# Patient Record
Sex: Female | Born: 1980 | Race: White | Hispanic: No | Marital: Single | Smoking: Never smoker
Health system: Southern US, Community
[De-identification: ages and names within clinical notes are randomized; demographics above are authoritative.]

## PROBLEM LIST (undated history)

## (undated) DIAGNOSIS — L03116 Cellulitis of left lower limb: Secondary | ICD-10-CM

## (undated) DIAGNOSIS — Z889 Allergy status to unspecified drugs, medicaments and biological substances status: Secondary | ICD-10-CM

## (undated) DIAGNOSIS — G2581 Restless legs syndrome: Secondary | ICD-10-CM

## (undated) DIAGNOSIS — Z973 Presence of spectacles and contact lenses: Secondary | ICD-10-CM

## (undated) DIAGNOSIS — K589 Irritable bowel syndrome without diarrhea: Secondary | ICD-10-CM

## (undated) DIAGNOSIS — I10 Essential (primary) hypertension: Secondary | ICD-10-CM

## (undated) DIAGNOSIS — H35039 Hypertensive retinopathy, unspecified eye: Secondary | ICD-10-CM

## (undated) DIAGNOSIS — J45909 Unspecified asthma, uncomplicated: Secondary | ICD-10-CM

## (undated) DIAGNOSIS — E11319 Type 2 diabetes mellitus with unspecified diabetic retinopathy without macular edema: Secondary | ICD-10-CM

## (undated) DIAGNOSIS — E119 Type 2 diabetes mellitus without complications: Secondary | ICD-10-CM

## (undated) DIAGNOSIS — K219 Gastro-esophageal reflux disease without esophagitis: Secondary | ICD-10-CM

## (undated) DIAGNOSIS — E113599 Type 2 diabetes mellitus with proliferative diabetic retinopathy without macular edema, unspecified eye: Secondary | ICD-10-CM

## (undated) HISTORY — DX: Type 2 diabetes mellitus with unspecified diabetic retinopathy without macular edema: E11.319

## (undated) HISTORY — PX: TONSILLECTOMY: SUR1361

## (undated) HISTORY — DX: Hypertensive retinopathy, unspecified eye: H35.039

## (undated) HISTORY — PX: CHOLECYSTECTOMY: SHX55

## (undated) HISTORY — PX: TOOTH EXTRACTION: SUR596

## (undated) HISTORY — PX: CYST EXCISION: SHX5701

---

## 2002-03-31 ENCOUNTER — Emergency Department (HOSPITAL_COMMUNITY): Admission: EM | Admit: 2002-03-31 | Discharge: 2002-03-31 | Payer: Self-pay | Admitting: Emergency Medicine

## 2007-11-04 ENCOUNTER — Emergency Department (HOSPITAL_COMMUNITY): Admission: EM | Admit: 2007-11-04 | Discharge: 2007-11-04 | Payer: Self-pay | Admitting: Emergency Medicine

## 2007-11-23 ENCOUNTER — Emergency Department (HOSPITAL_COMMUNITY): Admission: EM | Admit: 2007-11-23 | Discharge: 2007-11-23 | Payer: Self-pay | Admitting: Emergency Medicine

## 2007-11-28 ENCOUNTER — Ambulatory Visit: Payer: Self-pay | Admitting: Internal Medicine

## 2007-11-28 ENCOUNTER — Observation Stay (HOSPITAL_COMMUNITY): Admission: EM | Admit: 2007-11-28 | Discharge: 2007-11-29 | Payer: Self-pay | Admitting: Emergency Medicine

## 2009-06-26 ENCOUNTER — Emergency Department (HOSPITAL_COMMUNITY): Admission: EM | Admit: 2009-06-26 | Discharge: 2009-06-26 | Payer: Self-pay | Admitting: Emergency Medicine

## 2011-03-03 LAB — BASIC METABOLIC PANEL
BUN: 11 mg/dL (ref 6–23)
Calcium: 9.3 mg/dL (ref 8.4–10.5)
Creatinine, Ser: 0.6 mg/dL (ref 0.4–1.2)
GFR calc non Af Amer: >60 mL/min (ref >60)
Glucose, Bld: 284 mg/dL — ABNORMAL HIGH (ref 70–99)

## 2011-03-03 LAB — URINALYSIS, ROUTINE W REFLEX MICROSCOPIC
Protein, ur: NEGATIVE mg/dL
Urobilinogen, UA: 1 mg/dL (ref 0.0–1.0)

## 2011-03-03 LAB — DIFFERENTIAL
Basophils Absolute: 0 10*3/uL (ref 0.0–0.1)
Eosinophils Relative: 2 % (ref 0–5)
Lymphocytes Relative: 36 % (ref 12–46)
Neutro Abs: 4.3 10*3/uL (ref 1.7–7.7)
Neutrophils Relative %: 55 % (ref 43–77)

## 2011-03-03 LAB — URINE MICROSCOPIC-ADD ON

## 2011-03-03 LAB — WET PREP, GENITAL
Trich, Wet Prep: NONE SEEN
Yeast Wet Prep HPF POC: NONE SEEN

## 2011-03-03 LAB — PROTIME-INR: Prothrombin Time: 12.5 seconds (ref 11.6–15.2)

## 2011-03-03 LAB — CBC
HCT: 41.9 % (ref 36.0–46.0)
Platelets: 318 10*3/uL (ref 150–400)
RDW: 13.4 % (ref 11.5–15.5)

## 2011-03-03 LAB — GC/CHLAMYDIA PROBE AMP, GENITAL: GC Probe Amp, Genital: NEGATIVE

## 2011-04-10 NOTE — Discharge Summary (Signed)
Lindsay Love, Lindsay Love             ACCOUNT NO.:  [id number]   MEDICAL RECORD NO.:  [id number]          PATIENT TYPE:  OBV   LOCATION:  5118                         FACILITY:  [facility]   PHYSICIAN:  Zenaida Deed. Mayford Knife, M.D.DATE OF BIRTH:  [birth year]   DATE OF ADMISSION:  11/28/2007  DATE OF DISCHARGE:  11/29/2007                               DISCHARGE SUMMARY   DISCHARGE DIAGNOSIS:  1. Pyelonephritis.  2. Candidal vaginitis.  3. Diabetes mellitus.   DISCHARGE MEDICATIONS:  1. Keflex 500 mg twice daily.  2. Amaryl 4 mg daily.  3. Pyridium 200 mg t.i.d. x2 days.   HOSPITAL COURSE:  30 year old obese white female, presented with  hyperglycemia to 300s, associated with candidal vaginitis and outpatient  failure of urinary tract infection treatment.  Less than a week prior to  discharge, the patient seen by the emergency department here, and  diagnosed with urinary tract infection and started on Bactrim DS twice  daily.  Cultures did eventually show though that it was resistant to  this antibiotic.  In the hospital, she received ceftriaxone 1 g, and was  transitioned to Keflex for which the previous culture was sensitive.  Her diabetes remained in the 200s while she was here.  Her A1c was just  over 10.  She was not on any medications, so she was started on Amaryl 4  mg daily to try to achieve short-term glucose control in a p.o. fashion.   FOLLOWUP:  The patient can call [facility] to  arrange for followup.  She will tell them that she was seen in the  hospital and they advised her that she could be a new patient for the  practice.  If they have questions, they can call Dr. Irving Burton.   ACTIVITY:  No restrictions.   DIET:  Low-carb.   FOLLOWUP ISSUES:  Consider starting metformin, depending upon how the  patient responds to the Amaryl.  Also, consider an ACE inhibitor,  although her blood pressures were borderline here in the hospital it was  not started at  that time.  Finally, starting aspirin could be  considered, although she is very young.  Many guidelines suggest the  consideration of diabetes to be a coronary artery disease equivalent.      Angeline Slim, M.D.  Electronically Signed      Zenaida Deed. Mayford Knife, M.D.  Electronically Signed    AL/MEDQ  D:  11/29/2007  T:  11/29/2007  Job:  [id number]

## 2011-04-10 NOTE — H&P (Signed)
Lindsay Love, Lindsay Love             ACCOUNT NO.:  [id number]   MEDICAL RECORD NO.:  [id number]          PATIENT TYPE:  OBV   LOCATION:  5118                         FACILITY:  [facility]   PHYSICIAN:  Angeline Slim, M.D.  DATE OF BIRTH:  Feb 17, 1981   DATE OF ADMISSION:  11/28/2007  DATE OF DISCHARGE:                              HISTORY & PHYSICAL   CHIEF COMPLAINT:  Dysuria/vomiting/belly pain.   HISTORY OF PRESENT ILLNESS:  A 30 year old obese white female with  history of diabetes mellitus, presents with dysuria and lower abdominal  pain and nausea and vomiting that has been going on for the past few  days.  She was treated earlier this month for chlamydia and has not had  sex since that time.  She has also had a recent urinary tract infection  that grew out e-coli resistant to Cipro and Bactrim in the Emergency  Department culture back on 11/23/07.  Before the culture was grown out,  she was started on Bactrim so that was not completely treated.  Her last  menstrual period was on 11/14/08.  She is having increased vaginal  discharge and itching.  She denies any fever.   REVIEW OF SYSTEMS:  Otherwise, normal.   PAST MEDICAL HISTORY:  1. Obesity.  2. Diabetes mellitus.  3. History of STD's.   MEDICATIONS:  Essentially none.   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  She denies any alcohol, drugs  or smoking.   FAMILY HISTORY:  Noncontributory.   ALLERGIES:  No known drug allergies.   PHYSICAL EXAM:  VITAL SIGNS:  Temperature 97.1.  Sats 98% on room air.  Blood pressure 114-140/82-87, pulse 89-107, respiration 18-20.  GENERAL:  No acute distress.  Obese white female.  HEENT:  Normal external ears, nose, eyes and lips.  No thyromegaly.  Normal teeth and pharynx.  PULMONARY:  Clear to auscultation bilaterally with normal effort.  HEART:  Regular rate and rhythm with a transient soft murmur.  ABDOMEN:  Obese, soft, mild lower abdominal tenderness.  BACK:  Shows mild right  CVA tenderness.  GU:  Performed by the ED Jannifer Fischler.  Notable for thin white discharge and  no report was given of any cervical motion tenderness.  EXTREMITIES:  Show 2+ dorsalis pedis pulses.  No edema or cyanosis.  SKIN:  Shows no rash or pallor.   LABORATORY DATA:  Urine culture on 11/23/07 showed e-coli, resistant to  Cipro and Bactrim but sensitive to first and third generation  cephalosporins.  Wet prep shows few yeast, many white blood cells and  negative trich's/clue cells.  Urine pregnancy is negative.  Hemoglobin  15.8, platelets 357, white count 7.4, sodium 132.8, potassium 4.3,  glucose 276, creatinine 0.64, BUN 9, chloride 98, bicarbonate 26,  calcium 9.0.   ASSESSMENT AND PLAN:  A 30 year old white female with pyelonephritis,  yeast infection, diabetes mellitus:  1. Pyelonephritis:  Ceftriaxone will be administered in the hospital.      Consider Keflex at discharge. Last culture was sensitive to this      organism.  We will check a urine culture and await the  results,      although I would not be surprised if they are negative.  The      patient was partially treated with Bactrim despite e-coli      resistance to Bactrim.  2. Yeast:  Patient was given Diflucan 150 mg in the Emergency      Department which should treat this.  3. Diabetes mellitus:  Will start the patient on Amaryl and sliding      scale insulin.  4. Hyponatremia:  This is mild.  Will just watch it for now.  Consider      a little bit of IV fluids although the patient is not clinically      dry at this time.  5. The patient will have regular diet and be ambulatory so no DVT or      ulcer prophylaxis is indicated.      Angeline Slim, M.D.  Electronically Signed     AL/MEDQ  D:  11/29/2007  T:  11/29/2007  Job:  [id number]

## 2011-08-16 LAB — CBC
MCHC: 34.7
MCV: 85.7
Platelets: 325
Platelets: 357
RBC: 4.3
RBC: 5.23 — ABNORMAL HIGH
WBC: 7.4
WBC: 7.6

## 2011-08-16 LAB — URINALYSIS, ROUTINE W REFLEX MICROSCOPIC
Bilirubin Urine: NEGATIVE
Protein, ur: 30 — AB
Urobilinogen, UA: 0.2

## 2011-08-16 LAB — BASIC METABOLIC PANEL
CO2: 26
Calcium: 9
Calcium: 9
Chloride: 101
Creatinine, Ser: 0.64
Creatinine, Ser: 0.73
GFR calc Af Amer: >60
GFR calc Af Amer: >60
GFR calc non Af Amer: >60

## 2011-08-16 LAB — HEMOGLOBIN A1C
Hgb A1c MFr Bld: 10.9 — ABNORMAL HIGH
Mean Plasma Glucose: 311

## 2011-08-16 LAB — DIFFERENTIAL
Basophils Relative: 0
Monocytes Relative: 4
Neutro Abs: 5
Neutrophils Relative %: 68

## 2011-08-16 LAB — POCT PREGNANCY, URINE
Operator id: 28833
Preg Test, Ur: NEGATIVE

## 2011-08-16 LAB — GC/CHLAMYDIA PROBE AMP, GENITAL: GC Probe Amp, Genital: NEGATIVE

## 2011-08-16 LAB — RPR: RPR Ser Ql: NONREACTIVE

## 2011-08-16 LAB — URINE CULTURE: Colony Count: >=100000

## 2011-08-16 LAB — WET PREP, GENITAL

## 2011-08-31 LAB — URINALYSIS, ROUTINE W REFLEX MICROSCOPIC
Bilirubin Urine: NEGATIVE
Nitrite: NEGATIVE
Specific Gravity, Urine: 1.02
Urobilinogen, UA: 0.2
pH: 5.5

## 2011-08-31 LAB — URINE CULTURE

## 2011-08-31 LAB — URINE MICROSCOPIC-ADD ON

## 2011-09-03 LAB — URINE MICROSCOPIC-ADD ON

## 2011-09-03 LAB — URINALYSIS, ROUTINE W REFLEX MICROSCOPIC
Glucose, UA: >1000 — AB
Ketones, ur: 15 — AB
Nitrite: NEGATIVE
Specific Gravity, Urine: 1.042 — ABNORMAL HIGH
pH: 5.5

## 2011-09-03 LAB — WET PREP, GENITAL: Yeast Wet Prep HPF POC: NONE SEEN

## 2011-09-03 LAB — POCT PREGNANCY, URINE
Operator id: 272551
Preg Test, Ur: NEGATIVE

## 2011-09-03 LAB — RPR: RPR Ser Ql: NONREACTIVE

## 2011-09-03 LAB — URINE CULTURE: Colony Count: 3000

## 2012-12-09 ENCOUNTER — Encounter (HOSPITAL_BASED_OUTPATIENT_CLINIC_OR_DEPARTMENT_OTHER): Payer: Self-pay | Admitting: Emergency Medicine

## 2012-12-09 ENCOUNTER — Emergency Department (HOSPITAL_BASED_OUTPATIENT_CLINIC_OR_DEPARTMENT_OTHER)
Admission: EM | Admit: 2012-12-09 | Discharge: 2012-12-09 | Disposition: A | Discharge status: Home / Self Care | Payer: Medicaid Other | Attending: Emergency Medicine | Admitting: Emergency Medicine

## 2012-12-09 DIAGNOSIS — H60399 Other infective otitis externa, unspecified ear: Secondary | ICD-10-CM | POA: Insufficient documentation

## 2012-12-09 DIAGNOSIS — E119 Type 2 diabetes mellitus without complications: Secondary | ICD-10-CM | POA: Insufficient documentation

## 2012-12-09 DIAGNOSIS — Z79899 Other long term (current) drug therapy: Secondary | ICD-10-CM | POA: Insufficient documentation

## 2012-12-09 DIAGNOSIS — H6091 Unspecified otitis externa, right ear: Secondary | ICD-10-CM

## 2012-12-09 HISTORY — DX: Type 2 diabetes mellitus without complications: E11.9

## 2012-12-09 MED ORDER — ANTIPYRINE-BENZOCAINE 5.4-1.4 % OT SOLN: 3.0000 [drp] | OTIC | Status: DC | PRN

## 2012-12-09 MED ORDER — ACETAMINOPHEN-CODEINE 120-12 MG/5ML PO SUSP: 5.0000 mL | Freq: Four times a day (QID) | ORAL | Status: DC | PRN

## 2012-12-09 MED ORDER — CIPROFLOXACIN-DEXAMETHASONE 0.3-0.1 % OT SUSP
4.0000 [drp] | Freq: Once | OTIC | Status: AC
  Administered 2012-12-09: 4 [drp] via OTIC
  Filled 2012-12-09: qty 7.5

## 2012-12-09 MED ORDER — CIPROFLOXACIN-DEXAMETHASONE 0.3-0.1 % OT SUSP: 4.0000 [drp] | Freq: Two times a day (BID) | OTIC | Status: AC

## 2012-12-09 MED ORDER — ANTIPYRINE-BENZOCAINE 5.4-1.4 % OT SOLN
3.0000 [drp] | Freq: Once | OTIC | Status: AC
  Administered 2012-12-09: 4 [drp] via OTIC
  Filled 2012-12-09: qty 10

## 2012-12-09 NOTE — ED Notes (Signed)
Pt has had upper respiratory infection, today developed severe headache today and right ear pain that radiates down neck

## 2012-12-09 NOTE — ED Provider Notes (Signed)
History     CSN: [id number]  Arrival date & time 12/09/12  2153   First MD Initiated Contact with Patient 12/09/12 2308      Chief Complaint  Patient presents with  . Otalgia    (Consider location/radiation/quality/duration/timing/severity/associated sxs/prior treatment) HPI R ear pain x 2 days.  Sharp in quality and radiates to R face, no F/C, no trauma, no discharge, no recent swimming. Taking motrin no relief. MOD in severity. No h/o same, no sore throat, some mild cough and congestion. No CP or SOB. It hurts to touch her ear  Here with a friend who has had cough and congestion recently.  Past Medical History  Diagnosis Date  . Diabetes mellitus without complication     No past surgical history on file.  No family history on file.  History  Substance Use Topics  . Smoking status: Not on file  . Smokeless tobacco: Not on file  . Alcohol Use:     OB History    Grav Para Term Preterm Abortions TAB SAB Ect Mult Living                  Review of Systems  Constitutional: Negative for fever and chills.  HENT: Negative for neck pain, neck stiffness and voice change.   Eyes: Negative for pain.  Respiratory: Negative for shortness of breath.   Cardiovascular: Negative for chest pain.  Gastrointestinal: Negative for abdominal pain.  Genitourinary: Negative for dysuria.  Musculoskeletal: Negative for back pain.  Skin: Negative for rash.  Neurological: Negative for headaches.  All other systems reviewed and are negative.    Allergies  Review of patient's allergies indicates no known allergies.  Home Medications   Current Outpatient Rx  Name  Route  Sig  Dispense  Refill  . GLIPIZIDE 10 MG PO TABS   Oral   Take 10 mg by mouth 2 (two) times daily before a meal.         . METFORMIN HCL 500 MG PO TABS   Oral   Take 500 mg by mouth 2 (two) times daily with a meal.         . ACETAMINOPHEN-CODEINE 120-12 MG/5ML PO SUSP   Oral   Take 5 mLs by mouth every 6  (six) hours as needed for pain.   60 mL   0   . ANTIPYRINE-BENZOCAINE 5.4-1.4 % OT SOLN   Right Ear   Place 3 drops into the right ear every 2 (two) hours as needed for pain.   10 mL   0   . CIPROFLOXACIN-DEXAMETHASONE 0.3-0.1 % OT SUSP   Right Ear   Place 4 drops into the right ear 2 (two) times daily.   7.5 mL   0     BP 141/93  Pulse 98  Temp 98.3 F (36.8 C) (Oral)  Resp 18  Ht 5\' 7"  (1.702 m)  Wt 200 lb (90.719 kg)  BMI 31.32 kg/m2  SpO2 99%  LMP 12/07/2012  Physical Exam  Constitutional: She is oriented to person, place, and time. She appears well-developed and well-nourished.  HENT:  Head: Normocephalic and atraumatic.  Left Ear: External ear normal.  Nose: Nose normal.  Mouth/Throat: Oropharynx is clear and moist. No oropharyngeal exudate.       R ear canal swollen with ext auricular tenderness. No mastoid tenderness or swelling.   Eyes: EOM are normal. Pupils are equal, round, and reactive to light.  Neck: Neck supple.  Cardiovascular: Normal rate, regular rhythm  and intact distal pulses.   Pulmonary/Chest: Effort normal and breath sounds normal. No respiratory distress.  Abdominal: Bowel sounds are normal. She exhibits no distension. There is no tenderness.  Musculoskeletal: Normal range of motion. She exhibits no edema.  Neurological: She is alert and oriented to person, place, and time.  Skin: Skin is warm and dry.    ED Course  Procedures (including critical care time)  Ear drops provided with Rx and precautions. Plan outpatient follow up  1. Otitis externa of right ear       MDM  VS and nursing notes reviewed. Stable for outpatient follow up.       Sunnie Nielsen, MD 12/10/12 [phone]

## 2013-01-07 ENCOUNTER — Encounter (HOSPITAL_BASED_OUTPATIENT_CLINIC_OR_DEPARTMENT_OTHER): Payer: Self-pay | Admitting: Emergency Medicine

## 2013-01-07 ENCOUNTER — Emergency Department (HOSPITAL_BASED_OUTPATIENT_CLINIC_OR_DEPARTMENT_OTHER)
Admission: EM | Admit: 2013-01-07 | Discharge: 2013-01-07 | Disposition: A | Discharge status: Home / Self Care | Payer: Medicaid Other | Attending: Emergency Medicine | Admitting: Emergency Medicine

## 2013-01-07 ENCOUNTER — Emergency Department (HOSPITAL_BASED_OUTPATIENT_CLINIC_OR_DEPARTMENT_OTHER): Payer: Medicaid Other

## 2013-01-07 DIAGNOSIS — H9209 Otalgia, unspecified ear: Secondary | ICD-10-CM | POA: Insufficient documentation

## 2013-01-07 DIAGNOSIS — J3489 Other specified disorders of nose and nasal sinuses: Secondary | ICD-10-CM | POA: Insufficient documentation

## 2013-01-07 DIAGNOSIS — E119 Type 2 diabetes mellitus without complications: Secondary | ICD-10-CM | POA: Insufficient documentation

## 2013-01-07 DIAGNOSIS — R0982 Postnasal drip: Secondary | ICD-10-CM

## 2013-01-07 DIAGNOSIS — Z79899 Other long term (current) drug therapy: Secondary | ICD-10-CM | POA: Insufficient documentation

## 2013-01-07 DIAGNOSIS — J029 Acute pharyngitis, unspecified: Secondary | ICD-10-CM | POA: Insufficient documentation

## 2013-01-07 DIAGNOSIS — R6889 Other general symptoms and signs: Secondary | ICD-10-CM | POA: Insufficient documentation

## 2013-01-07 DIAGNOSIS — J069 Acute upper respiratory infection, unspecified: Secondary | ICD-10-CM | POA: Insufficient documentation

## 2013-01-07 MED ORDER — FLUTICASONE PROPIONATE 50 MCG/ACT NA SUSP: 2.0000 | Freq: Every day | NASAL | Status: DC

## 2013-01-07 NOTE — ED Notes (Signed)
Pt c/o cough, congestion and fever x 5 days

## 2013-01-07 NOTE — ED Provider Notes (Signed)
History     CSN: [id number]  Arrival date & time 01/07/13  0101   First MD Initiated Contact with Patient 01/07/13 0243      Chief Complaint  Patient presents with  . URI    (Consider location/radiation/quality/duration/timing/severity/associated sxs/prior treatment) Patient is a 32 y.o. female presenting with URI. The history is provided by the patient. No language interpreter was used.  URI Presenting symptoms: congestion, cough, ear pain, rhinorrhea and sore throat   Presenting symptoms: no fever   Congestion:    Location:  Nasal   Interferes with sleep: no     Interferes with eating/drinking: no   Cough:    Cough characteristics:  Non-productive   Severity:  Mild   Onset quality:  Gradual   Timing:  Intermittent   Progression:  Unchanged   Chronicity:  New Ear pain:    Location:  Bilateral   Severity:  Moderate   Onset quality:  Gradual   Progression:  Unchanged Sore throat:    Severity:  Mild   Onset quality:  Gradual   Timing:  Constant   Progression:  Unchanged Severity:  Mild Timing:  Constant Chronicity:  New Relieved by:  Nothing Worsened by:  Nothing tried Associated symptoms: sneezing   Associated symptoms: no headaches, no neck pain and no swollen glands   Risk factors: not elderly and no immunosuppression     Past Medical History  Diagnosis Date  . Diabetes mellitus without complication     Past Surgical History  Procedure Laterality Date  . Cesarean section    . Tonsillectomy    . Cholecystectomy    . Tooth extraction    . Cyst excision      No family history on file.  History  Substance Use Topics  . Smoking status: Never Smoker   . Smokeless tobacco: Not on file  . Alcohol Use: No    OB History   Grav Para Term Preterm Abortions TAB SAB Ect Mult Living                  Review of Systems  Constitutional: Negative for fever.  HENT: Positive for ear pain, congestion, sore throat, rhinorrhea and sneezing. Negative for neck  pain and neck stiffness.   Eyes: Negative for photophobia.  Respiratory: Positive for cough.   Neurological: Negative for headaches.  All other systems reviewed and are negative.    Allergies  Review of patient's allergies indicates no known allergies.  Home Medications   Current Outpatient Rx  Name  Route  Sig  Dispense  Refill  . glipiZIDE (GLUCOTROL) 10 MG tablet   Oral   Take 10 mg by mouth 2 (two) times daily before a meal.         . metFORMIN (GLUCOPHAGE) 500 MG tablet   Oral   Take 500 mg by mouth 2 (two) times daily with a meal.         . acetaminophen-codeine 120-12 MG/5ML suspension   Oral   Take 5 mLs by mouth every 6 (six) hours as needed for pain.   60 mL   0   . antipyrine-benzocaine (AURALGAN) otic solution   Right Ear   Place 3 drops into the right ear every 2 (two) hours as needed for pain.   10 mL   0     BP 122/92  Pulse 127  Temp(Src) 98.7 F (37.1 C) (Oral)  Ht 5\' 7"  (1.702 m)  Wt 200 lb (90.719 kg)  BMI 31.32  kg/m2  SpO2 100%  LMP 12/07/2012  Physical Exam  Constitutional: She is oriented to person, place, and time. She appears well-developed and well-nourished. No distress.  HENT:  Head: Normocephalic and atraumatic.  Right Ear: External ear normal.  Left Ear: External ear normal.  Mouth/Throat: Oropharynx is clear and moist. No oropharyngeal exudate.  Eyes: EOM are normal. Pupils are equal, round, and reactive to light.  Neck: Normal range of motion. Neck supple.  Cardiovascular: Normal rate, regular rhythm and intact distal pulses.   HR on exam 90  Pulmonary/Chest: Effort normal and breath sounds normal. No stridor. She has no wheezes. She has no rales.  Abdominal: Soft. Bowel sounds are normal. There is no tenderness. There is no rebound and no guarding.  Musculoskeletal: Normal range of motion.  Lymphadenopathy:    She has no cervical adenopathy.  Neurological: She is alert and oriented to person, place, and time.  Skin:  Skin is warm and dry.  Psychiatric: She has a normal mood and affect.    ED Course  Procedures (including critical care time)  Labs Reviewed - No data to display Dg Chest 2 View  01/07/2013  *RADIOLOGY REPORT*  Clinical Data: Cough.  CHEST - 2 VIEW  Comparison: None.  Findings: The lungs are well-aerated and clear.  There is no evidence of focal opacification, pleural effusion or pneumothorax.  The heart is normal in size; the mediastinal contour is within normal limits.  No acute osseous abnormalities are seen.  IMPRESSION: No acute cardiopulmonary process seen.   Original Report Authenticated By: Tonia Ghent, M.D.      No diagnosis found.    MDM  Will treat for URI        Darina Hartwell K Levorn Oleski-Rasch, MD 01/07/13 9604

## 2013-03-27 ENCOUNTER — Emergency Department (HOSPITAL_BASED_OUTPATIENT_CLINIC_OR_DEPARTMENT_OTHER)
Admission: EM | Admit: 2013-03-27 | Discharge: 2013-03-27 | Disposition: A | Discharge status: Home / Self Care | Payer: Medicaid Other | Attending: Emergency Medicine | Admitting: Emergency Medicine

## 2013-03-27 ENCOUNTER — Encounter (HOSPITAL_BASED_OUTPATIENT_CLINIC_OR_DEPARTMENT_OTHER): Payer: Self-pay | Admitting: *Deleted

## 2013-03-27 DIAGNOSIS — R739 Hyperglycemia, unspecified: Secondary | ICD-10-CM

## 2013-03-27 DIAGNOSIS — E1169 Type 2 diabetes mellitus with other specified complication: Secondary | ICD-10-CM | POA: Insufficient documentation

## 2013-03-27 DIAGNOSIS — S335XXA Sprain of ligaments of lumbar spine, initial encounter: Secondary | ICD-10-CM | POA: Insufficient documentation

## 2013-03-27 DIAGNOSIS — Y92410 Unspecified street and highway as the place of occurrence of the external cause: Secondary | ICD-10-CM | POA: Insufficient documentation

## 2013-03-27 DIAGNOSIS — Y9389 Activity, other specified: Secondary | ICD-10-CM | POA: Insufficient documentation

## 2013-03-27 DIAGNOSIS — Z79899 Other long term (current) drug therapy: Secondary | ICD-10-CM | POA: Insufficient documentation

## 2013-03-27 DIAGNOSIS — S39012A Strain of muscle, fascia and tendon of lower back, initial encounter: Secondary | ICD-10-CM

## 2013-03-27 DIAGNOSIS — IMO0002 Reserved for concepts with insufficient information to code with codable children: Secondary | ICD-10-CM | POA: Insufficient documentation

## 2013-03-27 LAB — GLUCOSE, CAPILLARY: Glucose-Capillary: 375 mg/dL — ABNORMAL HIGH (ref 70–99)

## 2013-03-27 MED ORDER — IBUPROFEN 800 MG PO TABS: 800.0000 mg | ORAL_TABLET | Freq: Three times a day (TID) | ORAL | Status: DC | PRN

## 2013-03-27 MED ORDER — OXYCODONE-ACETAMINOPHEN 5-325 MG PO TABS: 1.0000 | ORAL_TABLET | Freq: Four times a day (QID) | ORAL | Status: DC | PRN

## 2013-03-27 MED ORDER — KETOROLAC TROMETHAMINE 30 MG/ML IJ SOLN
60.0000 mg | Freq: Once | INTRAMUSCULAR | Status: AC
  Administered 2013-03-27: 60 mg via INTRAMUSCULAR
  Filled 2013-03-27 (×2): qty 1

## 2013-03-27 MED ORDER — HYDROMORPHONE HCL PF 2 MG/ML IJ SOLN
2.0000 mg | Freq: Once | INTRAMUSCULAR | Status: AC
  Administered 2013-03-27: 2 mg via INTRAMUSCULAR
  Filled 2013-03-27: qty 1
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
## 2013-04-07 ENCOUNTER — Encounter (HOSPITAL_BASED_OUTPATIENT_CLINIC_OR_DEPARTMENT_OTHER): Payer: Self-pay | Admitting: *Deleted

## 2013-04-07 ENCOUNTER — Emergency Department (HOSPITAL_BASED_OUTPATIENT_CLINIC_OR_DEPARTMENT_OTHER)
Admission: EM | Admit: 2013-04-07 | Discharge: 2013-04-07 | Disposition: A | Discharge status: Home / Self Care | Payer: Medicaid Other | Attending: Emergency Medicine | Admitting: Emergency Medicine

## 2013-04-07 DIAGNOSIS — E119 Type 2 diabetes mellitus without complications: Secondary | ICD-10-CM | POA: Insufficient documentation

## 2013-04-07 DIAGNOSIS — Z79899 Other long term (current) drug therapy: Secondary | ICD-10-CM | POA: Insufficient documentation

## 2013-04-07 DIAGNOSIS — Z3202 Encounter for pregnancy test, result negative: Secondary | ICD-10-CM | POA: Insufficient documentation

## 2013-04-07 DIAGNOSIS — N390 Urinary tract infection, site not specified: Secondary | ICD-10-CM | POA: Insufficient documentation

## 2013-04-07 LAB — URINALYSIS, ROUTINE W REFLEX MICROSCOPIC
Ketones, ur: NEGATIVE mg/dL
Nitrite: NEGATIVE
pH: 6 (ref 5.0–8.0)

## 2013-04-07 LAB — URINE MICROSCOPIC-ADD ON

## 2013-04-07 LAB — PREGNANCY, URINE: Preg Test, Ur: NEGATIVE

## 2013-04-07 MED ORDER — PHENAZOPYRIDINE HCL 200 MG PO TABS: 200.0000 mg | ORAL_TABLET | Freq: Three times a day (TID) | ORAL | Status: DC | PRN

## 2013-04-07 MED ORDER — NITROFURANTOIN MONOHYD MACRO 100 MG PO CAPS: 100.0000 mg | ORAL_CAPSULE | Freq: Two times a day (BID) | ORAL | Status: DC

## 2013-04-07 MED ORDER — NITROFURANTOIN MONOHYD MACRO 100 MG PO CAPS
ORAL_CAPSULE | ORAL | Status: AC
  Administered 2013-04-07: 100 mg
  Filled 2013-04-07: qty 1

## 2013-04-07 MED ORDER — IBUPROFEN 600 MG PO TABS: 600.0000 mg | ORAL_TABLET | Freq: Four times a day (QID) | ORAL | Status: DC | PRN

## 2013-04-07 NOTE — ED Provider Notes (Signed)
History     CSN: [id number]  Arrival date & time 04/07/13  2204   First MD Initiated Contact with Patient 04/07/13 2259      Chief Complaint  Patient presents with  . Dysuria    (Consider location/radiation/quality/duration/timing/severity/associated sxs/prior treatment) Patient is a 32 y.o. female presenting with dysuria. The history is provided by the patient.  Dysuria  This is a recurrent problem. The current episode started yesterday. The problem occurs every urination. The problem has not changed since onset.The quality of the pain is described as burning. The pain is moderate. There has been no fever. She is sexually active. There is no history of pyelonephritis. Pertinent negatives include no chills, no sweats, no nausea, no vomiting, no discharge, no hematuria, no hesitancy, no possible pregnancy, no urgency and no flank pain. She has tried nothing for the symptoms. Her past medical history is significant for recurrent UTIs. Her past medical history does not include kidney stones.    Past Medical History  Diagnosis Date  . Diabetes mellitus without complication     Past Surgical History  Procedure Laterality Date  . Cesarean section    . Tonsillectomy    . Cholecystectomy    . Tooth extraction    . Cyst excision      No family history on file.  History  Substance Use Topics  . Smoking status: Never Smoker   . Smokeless tobacco: Not on file  . Alcohol Use: No    OB History   Grav Para Term Preterm Abortions TAB SAB Ect Mult Living                  Review of Systems  Constitutional: Negative for chills.  Gastrointestinal: Negative for nausea and vomiting.  Genitourinary: Positive for dysuria. Negative for hesitancy, urgency, hematuria and flank pain.  All other systems reviewed and are negative.    Allergies  Review of patient's allergies indicates no known allergies.  Home Medications   Current Outpatient Rx  Name  Route  Sig  Dispense  Refill  .  glipiZIDE (GLUCOTROL) 10 MG tablet   Oral   Take 10 mg by mouth 2 (two) times daily before a meal.         . ibuprofen (ADVIL,MOTRIN) 600 MG tablet   Oral   Take 1 tablet (600 mg total) by mouth every 6 (six) hours as needed for pain.   30 tablet   0   . ibuprofen (ADVIL,MOTRIN) 800 MG tablet   Oral   Take 1 tablet (800 mg total) by mouth every 8 (eight) hours as needed for pain.   30 tablet   0   . metFORMIN (GLUCOPHAGE) 500 MG tablet   Oral   Take 500 mg by mouth 2 (two) times daily with a meal.         . nitrofurantoin, macrocrystal-monohydrate, (MACROBID) 100 MG capsule   Oral   Take 1 capsule (100 mg total) by mouth 2 (two) times daily. X 7 days   14 capsule   0   . oxyCODONE-acetaminophen (PERCOCET/ROXICET) 5-325 MG per tablet   Oral   Take 1-2 tablets by mouth every 6 (six) hours as needed for pain.   20 tablet   0   . phenazopyridine (PYRIDIUM) 200 MG tablet   Oral   Take 1 tablet (200 mg total) by mouth 3 (three) times daily as needed for pain.   6 tablet   0     BP 137/90  Pulse 84  Temp(Src) 98.4 F (36.9 C) (Oral)  Resp 20  Wt 200 lb (90.719 kg)  BMI 31.32 kg/m2  SpO2 98%  LMP 03/04/2013  Physical Exam  Constitutional: She is oriented to person, place, and time. She appears well-developed and well-nourished. No distress.  HENT:  Head: Normocephalic and atraumatic.  Mouth/Throat: Oropharynx is clear and moist.  Eyes: Conjunctivae are normal. Pupils are equal, round, and reactive to light.  Neck: Normal range of motion. Neck supple.  Cardiovascular: Normal rate, regular rhythm and intact distal pulses.   Pulmonary/Chest: Effort normal and breath sounds normal. She has no wheezes. She has no rales.  Abdominal: Soft. Bowel sounds are normal. There is no tenderness. There is no rebound and no guarding.  Musculoskeletal: Normal range of motion.  Neurological: She is alert and oriented to person, place, and time.  Skin: Skin is warm and dry.   Psychiatric: She has a normal mood and affect.    ED Course  Procedures (including critical care time)  Labs Reviewed  URINALYSIS, ROUTINE W REFLEX MICROSCOPIC - Abnormal; Notable for the following:    Glucose, UA >1000 (*)    Leukocytes, UA SMALL (*)    All other components within normal limits  PREGNANCY, URINE  URINE MICROSCOPIC-ADD ON   No results found.   1. UTI (lower urinary tract infection)       MDM  Will treat for UTI with macrobid.  Return for fevers, chills intractable vomiting or worsening symptoms.  Patient verbalizes understanding and agrees to follow up        Aleeya Veitch Smitty Cords, MD 04/07/13 2317

## 2013-04-07 NOTE — ED Notes (Signed)
MD at bedside. 

## 2013-04-07 NOTE — ED Notes (Signed)
Dysuria and frequency since yesterday.  

## 2013-10-03 ENCOUNTER — Emergency Department (HOSPITAL_BASED_OUTPATIENT_CLINIC_OR_DEPARTMENT_OTHER)
Admission: EM | Admit: 2013-10-03 | Discharge: 2013-10-03 | Disposition: A | Discharge status: Home / Self Care | Payer: Medicaid Other | Attending: Emergency Medicine | Admitting: Emergency Medicine

## 2013-10-03 ENCOUNTER — Encounter (HOSPITAL_BASED_OUTPATIENT_CLINIC_OR_DEPARTMENT_OTHER): Payer: Self-pay | Admitting: Emergency Medicine

## 2013-10-03 DIAGNOSIS — Y929 Unspecified place or not applicable: Secondary | ICD-10-CM | POA: Insufficient documentation

## 2013-10-03 DIAGNOSIS — E119 Type 2 diabetes mellitus without complications: Secondary | ICD-10-CM | POA: Insufficient documentation

## 2013-10-03 DIAGNOSIS — S0502XA Injury of conjunctiva and corneal abrasion without foreign body, left eye, initial encounter: Secondary | ICD-10-CM

## 2013-10-03 DIAGNOSIS — X58XXXA Exposure to other specified factors, initial encounter: Secondary | ICD-10-CM | POA: Insufficient documentation

## 2013-10-03 DIAGNOSIS — Y939 Activity, unspecified: Secondary | ICD-10-CM | POA: Insufficient documentation

## 2013-10-03 DIAGNOSIS — Z79899 Other long term (current) drug therapy: Secondary | ICD-10-CM | POA: Insufficient documentation

## 2013-10-03 DIAGNOSIS — S058X9A Other injuries of unspecified eye and orbit, initial encounter: Secondary | ICD-10-CM | POA: Insufficient documentation

## 2013-10-03 MED ORDER — ERYTHROMYCIN 5 MG/GM OP OINT: TOPICAL_OINTMENT | OPHTHALMIC | Status: DC

## 2013-10-03 MED ORDER — FLUORESCEIN SODIUM 1 MG OP STRP
ORAL_STRIP | OPHTHALMIC | Status: AC
  Administered 2013-10-03: 02:00:00
  Filled 2013-10-03: qty 1

## 2013-10-03 MED ORDER — TETRACAINE HCL 0.5 % OP SOLN
OPHTHALMIC | Status: AC
  Administered 2013-10-03: 02:00:00
  Filled 2013-10-03: qty 2

## 2013-10-03 NOTE — ED Notes (Signed)
C/o left eye pain since yesterday, denies injury

## 2013-10-03 NOTE — ED Provider Notes (Signed)
CSN: [id number]     Arrival date & time 10/03/13  0132 History   First MD Initiated Contact with Patient 10/03/13 0139     Chief Complaint  Patient presents with  . Eye Pain   (Consider location/radiation/quality/duration/timing/severity/associated sxs/prior Treatment) Patient is a 32 y.o. female presenting with eye pain. The history is provided by the patient.  Eye Pain This is a new problem. The current episode started yesterday. The problem occurs constantly. The problem has not changed since onset.Pertinent negatives include no headaches. Nothing aggravates the symptoms. Nothing relieves the symptoms. She has tried nothing for the symptoms. The treatment provided no relief.    Past Medical History  Diagnosis Date  . Diabetes mellitus without complication    Past Surgical History  Procedure Laterality Date  . Cesarean section    . Tonsillectomy    . Cholecystectomy    . Tooth extraction    . Cyst excision     History reviewed. No pertinent family history. History  Substance Use Topics  . Smoking status: Never Smoker   . Smokeless tobacco: Not on file  . Alcohol Use: No   OB History   Grav Para Term Preterm Abortions TAB SAB Ect Mult Living                 Review of Systems  Eyes: Positive for pain.  Neurological: Negative for headaches.  All other systems reviewed and are negative.    Allergies  Review of patient's allergies indicates no known allergies.  Home Medications   Current Outpatient Rx  Name  Route  Sig  Dispense  Refill  . metFORMIN (GLUCOPHAGE) 500 MG tablet   Oral   Take 500 mg by mouth 2 (two) times daily with a meal.         . erythromycin ophthalmic ointment      Place a 1/2 inch ribbon of ointment into the lower eyelid.   3.5 g   0   . glipiZIDE (GLUCOTROL) 10 MG tablet   Oral   Take 10 mg by mouth 2 (two) times daily before a meal.         . ibuprofen (ADVIL,MOTRIN) 600 MG tablet   Oral   Take 1 tablet (600 mg total) by mouth  every 6 (six) hours as needed for pain.   30 tablet   0   . ibuprofen (ADVIL,MOTRIN) 800 MG tablet   Oral   Take 1 tablet (800 mg total) by mouth every 8 (eight) hours as needed for pain.   30 tablet   0   . nitrofurantoin, macrocrystal-monohydrate, (MACROBID) 100 MG capsule   Oral   Take 1 capsule (100 mg total) by mouth 2 (two) times daily. X 7 days   14 capsule   0   . oxyCODONE-acetaminophen (PERCOCET/ROXICET) 5-325 MG per tablet   Oral   Take 1-2 tablets by mouth every 6 (six) hours as needed for pain.   20 tablet   0   . phenazopyridine (PYRIDIUM) 200 MG tablet   Oral   Take 1 tablet (200 mg total) by mouth 3 (three) times daily as needed for pain.   6 tablet   0    BP 117/86  Pulse 94  Temp(Src) 98 F (36.7 C) (Oral)  Resp 20  Ht 5\' 7"  (1.702 m)  Wt 202 lb (91.627 kg)  BMI 31.63 kg/m2  SpO2 100% Physical Exam  Constitutional: She is oriented to person, place, and time. She appears well-developed and  well-nourished. No distress.  HENT:  Head: Normocephalic and atraumatic.  Eyes: EOM are normal. Pupils are equal, round, and reactive to light. Left eye exhibits no chemosis and no discharge.  Slit lamp exam:      The left eye shows corneal abrasion.    Neck: Normal range of motion. Neck supple.  Cardiovascular: Normal rate and regular rhythm.   Pulmonary/Chest: Effort normal and breath sounds normal. She has no wheezes.  Abdominal: Soft. Bowel sounds are normal.  Musculoskeletal: Normal range of motion.  Neurological: She is alert and oriented to person, place, and time.  Skin: Skin is warm and dry.  Psychiatric: She has a normal mood and affect.    ED Course  Procedures (including critical care time) Labs Review Labs Reviewed - No data to display Imaging Review No results found.  EKG Interpretation   None       MDM   1. Corneal abrasion, left, initial encounter    Corneal abrasion, not a contact lens wearer will treat with erythromycin  ointment    Aleea Hendry K Soraiya Ahner-Rasch, MD 10/03/13 0147

## 2013-10-03 NOTE — ED Notes (Signed)
MD at bedside. 

## 2014-03-02 ENCOUNTER — Encounter (HOSPITAL_BASED_OUTPATIENT_CLINIC_OR_DEPARTMENT_OTHER): Payer: Self-pay | Admitting: Emergency Medicine

## 2014-03-02 ENCOUNTER — Emergency Department (HOSPITAL_BASED_OUTPATIENT_CLINIC_OR_DEPARTMENT_OTHER)
Admission: EM | Admit: 2014-03-02 | Discharge: 2014-03-02 | Disposition: A | Discharge status: Home / Self Care | Payer: Medicaid Other | Attending: Emergency Medicine | Admitting: Emergency Medicine

## 2014-03-02 ENCOUNTER — Emergency Department (HOSPITAL_BASED_OUTPATIENT_CLINIC_OR_DEPARTMENT_OTHER): Payer: Medicaid Other

## 2014-03-02 DIAGNOSIS — F1010 Alcohol abuse, uncomplicated: Secondary | ICD-10-CM | POA: Insufficient documentation

## 2014-03-02 DIAGNOSIS — Z79899 Other long term (current) drug therapy: Secondary | ICD-10-CM | POA: Insufficient documentation

## 2014-03-02 DIAGNOSIS — J45909 Unspecified asthma, uncomplicated: Secondary | ICD-10-CM | POA: Insufficient documentation

## 2014-03-02 DIAGNOSIS — Z3202 Encounter for pregnancy test, result negative: Secondary | ICD-10-CM | POA: Insufficient documentation

## 2014-03-02 DIAGNOSIS — Z9089 Acquired absence of other organs: Secondary | ICD-10-CM | POA: Insufficient documentation

## 2014-03-02 DIAGNOSIS — K2970 Gastritis, unspecified, without bleeding: Secondary | ICD-10-CM

## 2014-03-02 DIAGNOSIS — M542 Cervicalgia: Secondary | ICD-10-CM | POA: Insufficient documentation

## 2014-03-02 DIAGNOSIS — Z792 Long term (current) use of antibiotics: Secondary | ICD-10-CM | POA: Insufficient documentation

## 2014-03-02 DIAGNOSIS — E119 Type 2 diabetes mellitus without complications: Secondary | ICD-10-CM | POA: Insufficient documentation

## 2014-03-02 DIAGNOSIS — R51 Headache: Secondary | ICD-10-CM | POA: Insufficient documentation

## 2014-03-02 DIAGNOSIS — K2990 Gastroduodenitis, unspecified, without bleeding: Principal | ICD-10-CM

## 2014-03-02 HISTORY — DX: Unspecified asthma, uncomplicated: J45.909

## 2014-03-02 LAB — CBC WITH DIFFERENTIAL/PLATELET
BASOS PCT: 1 % (ref 0–1)
Basophils Absolute: 0.1 10*3/uL (ref 0.0–0.1)
EOS ABS: 0.2 10*3/uL (ref 0.0–0.7)
Eosinophils Relative: 3 % (ref 0–5)
HCT: 43 % (ref 36.0–46.0)
Hemoglobin: 15.9 g/dL — ABNORMAL HIGH (ref 12.0–15.0)
Lymphocytes Relative: 32 % (ref 12–46)
Lymphs Abs: 2.9 10*3/uL (ref 0.7–4.0)
MCH: 31.5 pg (ref 26.0–34.0)
MCHC: 37 g/dL — AB (ref 30.0–36.0)
MCV: 85.3 fL (ref 78.0–100.0)
Monocytes Absolute: 0.5 10*3/uL (ref 0.1–1.0)
Monocytes Relative: 5 % (ref 3–12)
NEUTROS PCT: 60 % (ref 43–77)
Neutro Abs: 5.5 10*3/uL (ref 1.7–7.7)
Platelets: 382 10*3/uL (ref 150–400)
RBC: 5.04 MIL/uL (ref 3.87–5.11)
RDW: 12.5 % (ref 11.5–15.5)
WBC: 9.2 10*3/uL (ref 4.0–10.5)

## 2014-03-02 LAB — URINE MICROSCOPIC-ADD ON

## 2014-03-02 LAB — URINALYSIS, ROUTINE W REFLEX MICROSCOPIC
Bilirubin Urine: NEGATIVE
Glucose, UA: >1000 mg/dL — AB
Hgb urine dipstick: NEGATIVE
KETONES UR: NEGATIVE mg/dL
Leukocytes, UA: NEGATIVE
NITRITE: NEGATIVE
Protein, ur: NEGATIVE mg/dL
SPECIFIC GRAVITY, URINE: 1.043 — AB (ref 1.005–1.030)
UROBILINOGEN UA: 0.2 mg/dL (ref 0.0–1.0)
pH: 6 (ref 5.0–8.0)

## 2014-03-02 LAB — COMPREHENSIVE METABOLIC PANEL
ALT: 27 U/L (ref 0–35)
AST: 19 U/L (ref 0–37)
Albumin: 3.8 g/dL (ref 3.5–5.2)
Alkaline Phosphatase: 82 U/L (ref 39–117)
BUN: 11 mg/dL (ref 6–23)
CALCIUM: 10 mg/dL (ref 8.4–10.5)
CHLORIDE: 96 meq/L (ref 96–112)
CO2: 26 meq/L (ref 19–32)
CREATININE: 0.5 mg/dL (ref 0.50–1.10)
GLUCOSE: 398 mg/dL — AB (ref 70–99)
Potassium: 4.5 mEq/L (ref 3.7–5.3)
Sodium: 134 mEq/L — ABNORMAL LOW (ref 137–147)
Total Bilirubin: 0.5 mg/dL (ref 0.3–1.2)
Total Protein: 7.8 g/dL (ref 6.0–8.3)

## 2014-03-02 LAB — PREGNANCY, URINE: Preg Test, Ur: NEGATIVE

## 2014-03-02 LAB — LIPASE, BLOOD: LIPASE: 39 U/L (ref 11–59)

## 2014-03-02 MED ORDER — IOHEXOL 300 MG/ML  SOLN
50.0000 mL | Freq: Once | INTRAMUSCULAR | Status: AC | PRN
  Administered 2014-03-02: 50 mL via ORAL

## 2014-03-02 MED ORDER — HYDROCODONE-ACETAMINOPHEN 5-325 MG PO TABS: 1.0000 | ORAL_TABLET | Freq: Four times a day (QID) | ORAL | Status: DC | PRN

## 2014-03-02 MED ORDER — PROMETHAZINE HCL 25 MG PO TABS: 25.0000 mg | ORAL_TABLET | Freq: Four times a day (QID) | ORAL | Status: DC | PRN

## 2014-03-02 MED ORDER — FAMOTIDINE IN NACL 20-0.9 MG/50ML-% IV SOLN
INTRAVENOUS | Status: AC
  Administered 2014-03-02: 19:00:00 20 mg via INTRAVENOUS
  Filled 2014-03-02: qty 50

## 2014-03-02 MED ORDER — FAMOTIDINE 20 MG PO TABS: 20.0000 mg | ORAL_TABLET | Freq: Two times a day (BID) | ORAL | Status: DC

## 2014-03-02 MED ORDER — ONDANSETRON 4 MG PO TBDP
4.0000 mg | ORAL_TABLET | Freq: Once | ORAL | Status: AC
  Administered 2014-03-02: 4 mg via ORAL
  Filled 2014-03-02: qty 1

## 2014-03-02 MED ORDER — PANTOPRAZOLE SODIUM 40 MG IV SOLR: 40.0000 mg | Freq: Once | INTRAVENOUS | Status: DC

## 2014-03-02 MED ORDER — SODIUM CHLORIDE 0.9 % IV BOLUS (SEPSIS)
1000.0000 mL | Freq: Once | INTRAVENOUS | Status: AC
  Administered 2014-03-02: 1000 mL via INTRAVENOUS

## 2014-03-02 MED ORDER — FAMOTIDINE IN NACL 20-0.9 MG/50ML-% IV SOLN
20.0000 mg | Freq: Once | INTRAVENOUS | Status: AC
  Administered 2014-03-02: 20 mg via INTRAVENOUS

## 2014-03-02 MED ORDER — IOHEXOL 300 MG/ML  SOLN
100.0000 mL | Freq: Once | INTRAMUSCULAR | Status: AC | PRN
  Administered 2014-03-02: 100 mL via INTRAVENOUS

## 2014-03-02 MED ORDER — SODIUM CHLORIDE 0.9 % IV SOLN: INTRAVENOUS | Status: DC

## 2014-03-02 NOTE — ED Provider Notes (Signed)
CSN: [id number]     Arrival date & time 03/02/14  1818 History  This chart was scribed for No att. providers found by Elveria Rising, ED scribe.  This patient was seen in room MHOTF/OTF and the patient's care was started at 6:35 PM.   Chief Complaint  Patient presents with  . Abdominal Pain      Patient is a 33 y.o. female presenting with abdominal pain. The history is provided by the patient. No language interpreter was used.  Abdominal Pain Pain location:  Epigastric Pain radiates to:  Chest Pain severity:  Severe Onset quality:  Sudden Context: alcohol use   Associated symptoms: chest pain, nausea and vomiting   Associated symptoms: no chills, no cough, no diarrhea, no dysuria, no fever, no hematuria, no shortness of breath and no sore throat   Chest pain:    Quality:  Burning  HPI Comments: Lindsay Love is a 33 y.o. female who presents to the Emergency Department complaining of severe epigastric abdominal pain, onset two days ago. Associated symptoms include nausea, "heart burn" in the chest and vomiting. The patient describes her abdominal pain as radiating straight up to her chest and currently rates pain at 9/10. Patient admits her symptoms follow heavy drinking on Saturday night and began around 3 am Sunday morning. On Sunday patient vomited 4-5 times. During one episode she saw blood, but patient has not experienced hemoptysis since. Yesterday patient vomited 5 times and twice again today.  Patient also mentions neck pain related to a headache (onset two days ago along with abdominal pain) for which she cannot find relief. She shares that she has taken Advil, which usually resolves her headaches, but this headache has remained.  Patient denies fever and diarrhea.   PCP: Al Decant at [facility]  Past Medical History  Diagnosis Date  . Diabetes mellitus without complication   . Asthma    Past Surgical History  Procedure Laterality Date  . Cesarean section    .  Tonsillectomy    . Cholecystectomy    . Tooth extraction    . Cyst excision     History reviewed. No pertinent family history. History  Substance Use Topics  . Smoking status: Never Smoker   . Smokeless tobacco: Not on file  . Alcohol Use: No   OB History   Grav Para Term Preterm Abortions TAB SAB Ect Mult Living                 Review of Systems  Constitutional: Negative for fever and chills.  HENT: Negative for congestion, rhinorrhea and sore throat.   Eyes: Negative for visual disturbance.  Respiratory: Negative for cough, shortness of breath and wheezing.   Cardiovascular: Positive for chest pain.  Gastrointestinal: Positive for nausea, vomiting and abdominal pain. Negative for diarrhea.  Genitourinary: Negative for dysuria and hematuria.  Musculoskeletal: Positive for neck pain. Negative for back pain.  Skin: Negative for rash.  Neurological: Positive for headaches.  Hematological: Does not bruise/bleed easily.  Psychiatric/Behavioral: Negative for confusion.      Allergies  Review of patient's allergies indicates no known allergies.  Home Medications   Current Outpatient Rx  Name  Route  Sig  Dispense  Refill  . erythromycin ophthalmic ointment      Place a 1/2 inch ribbon of ointment into the lower eyelid.   3.5 g   0   . famotidine (PEPCID) 20 MG tablet   Oral   Take 1 tablet (20 mg total)  by mouth 2 (two) times daily.   30 tablet   0   . glipiZIDE (GLUCOTROL) 10 MG tablet   Oral   Take 10 mg by mouth 2 (two) times daily before a meal.         . HYDROcodone-acetaminophen (NORCO/VICODIN) 5-325 MG per tablet   Oral   Take 1-2 tablets by mouth every 6 (six) hours as needed for moderate pain.   10 tablet   0   . ibuprofen (ADVIL,MOTRIN) 600 MG tablet   Oral   Take 1 tablet (600 mg total) by mouth every 6 (six) hours as needed for pain.   30 tablet   0   . ibuprofen (ADVIL,MOTRIN) 800 MG tablet   Oral   Take 1 tablet (800 mg total) by mouth  every 8 (eight) hours as needed for pain.   30 tablet   0   . metFORMIN (GLUCOPHAGE) 500 MG tablet   Oral   Take 500 mg by mouth 2 (two) times daily with a meal.         . nitrofurantoin, macrocrystal-monohydrate, (MACROBID) 100 MG capsule   Oral   Take 1 capsule (100 mg total) by mouth 2 (two) times daily. X 7 days   14 capsule   0   . oxyCODONE-acetaminophen (PERCOCET/ROXICET) 5-325 MG per tablet   Oral   Take 1-2 tablets by mouth every 6 (six) hours as needed for pain.   20 tablet   0   . phenazopyridine (PYRIDIUM) 200 MG tablet   Oral   Take 1 tablet (200 mg total) by mouth 3 (three) times daily as needed for pain.   6 tablet   0   . promethazine (PHENERGAN) 25 MG tablet   Oral   Take 1 tablet (25 mg total) by mouth every 6 (six) hours as needed for nausea or vomiting.   12 tablet   1    BP 125/90  Pulse 97  Temp(Src) 98.2 F (36.8 C)  Resp 16  Ht 5\' 7"  (1.702 m)  Wt 204 lb (92.534 kg)  BMI 31.94 kg/m2  SpO2 100%  LMP 02/25/2014 Physical Exam  Nursing note and vitals reviewed. Constitutional: She is oriented to person, place, and time. She appears well-developed and well-nourished. No distress.  HENT:  Head: Normocephalic and atraumatic.  Mouth/Throat: Oropharynx is clear and moist. No oropharyngeal exudate.  Eyes: EOM are normal.  Neck: Neck supple. No tracheal deviation present.  Cardiovascular: Normal rate, regular rhythm and normal heart sounds.  Exam reveals no friction rub.   No murmur heard. Pulmonary/Chest: Effort normal and breath sounds normal. No respiratory distress. She has no wheezes. She has no rales.  Abdominal: Bowel sounds are normal. There is tenderness. There is no guarding.  Epigastric tenderness with guarding.   Musculoskeletal: Normal range of motion. She exhibits no edema.  Neurological: She is alert and oriented to person, place, and time. No cranial nerve deficit. She exhibits normal muscle tone. Coordination normal.  Skin:  Skin is warm and dry.  Psychiatric: She has a normal mood and affect. Her behavior is normal.    ED Course  Procedures (including critical care time) DIAGNOSTIC STUDIES: Oxygen Saturation is 100% on room air, normal by my interpretation.    COORDINATION OF CARE: 6:40 PM- Will order CT scan. Pt advised of plan for treatment and pt agrees.  Results for orders placed during the hospital encounter of 03/02/14  URINALYSIS, ROUTINE W REFLEX MICROSCOPIC      Result  Value Ref Range   Color, Urine YELLOW  YELLOW   APPearance CLEAR  CLEAR   Specific Gravity, Urine 1.043 (*) 1.005 - 1.030   pH 6.0  5.0 - 8.0   Glucose, UA >1000 (*) NEGATIVE mg/dL   Hgb urine dipstick NEGATIVE  NEGATIVE   Bilirubin Urine NEGATIVE  NEGATIVE   Ketones, ur NEGATIVE  NEGATIVE mg/dL   Protein, ur NEGATIVE  NEGATIVE mg/dL   Urobilinogen, UA 0.2  0.0 - 1.0 mg/dL   Nitrite NEGATIVE  NEGATIVE   Leukocytes, UA NEGATIVE  NEGATIVE  PREGNANCY, URINE      Result Value Ref Range   Preg Test, Ur NEGATIVE  NEGATIVE  LIPASE, BLOOD      Result Value Ref Range   Lipase 39  11 - 59 U/L  COMPREHENSIVE METABOLIC PANEL      Result Value Ref Range   Sodium 134 (*) 137 - 147 mEq/L   Potassium 4.5  3.7 - 5.3 mEq/L   Chloride 96  96 - 112 mEq/L   CO2 26  19 - 32 mEq/L   Glucose, Bld 398 (*) 70 - 99 mg/dL   BUN 11  6 - 23 mg/dL   Creatinine, Ser 5.640.50  0.50 - 1.10 mg/dL   Calcium 33.210.0  8.4 - 95.110.5 mg/dL   Total Protein 7.8  6.0 - 8.3 g/dL   Albumin 3.8  3.5 - 5.2 g/dL   AST 19  0 - 37 U/L   ALT 27  0 - 35 U/L   Alkaline Phosphatase 82  39 - 117 U/L   Total Bilirubin 0.5  0.3 - 1.2 mg/dL   GFR calc non Af Amer >90  >90 mL/min   GFR calc Af Amer >90  >90 mL/min  CBC WITH DIFFERENTIAL      Result Value Ref Range   WBC 9.2  4.0 - 10.5 K/uL   RBC 5.04  3.87 - 5.11 MIL/uL   Hemoglobin 15.9 (*) 12.0 - 15.0 g/dL   HCT 88.443.0  16.636.0 - 06.346.0 %   MCV 85.3  78.0 - 100.0 fL   MCH 31.5  26.0 - 34.0 pg   MCHC 37.0 (*) 30.0 - 36.0 g/dL    RDW 01.612.5  01.011.5 - 93.215.5 %   Platelets 382  150 - 400 K/uL   Neutrophils Relative % 60  43 - 77 %   Neutro Abs 5.5  1.7 - 7.7 K/uL   Lymphocytes Relative 32  12 - 46 %   Lymphs Abs 2.9  0.7 - 4.0 K/uL   Monocytes Relative 5  3 - 12 %   Monocytes Absolute 0.5  0.1 - 1.0 K/uL   Eosinophils Relative 3  0 - 5 %   Eosinophils Absolute 0.2  0.0 - 0.7 K/uL   Basophils Relative 1  0 - 1 %   Basophils Absolute 0.1  0.0 - 0.1 K/uL  URINE MICROSCOPIC-ADD ON      Result Value Ref Range   Squamous Epithelial / LPF RARE  RARE   WBC, UA 0-2  <3 WBC/hpf   RBC / HPF 7-10  <3 RBC/hpf   Bacteria, UA FEW (*) RARE   Urine-Other MUCOUS PRESENT     No results found.  Medications  sodium chloride 0.9 % bolus 1,000 mL (0 mLs Intravenous Stopped 03/02/14 2014)  ondansetron (ZOFRAN-ODT) disintegrating tablet 4 mg (4 mg Oral Given 03/02/14 1851)  famotidine (PEPCID) IVPB 20 mg (0 mg Intravenous Stopped 03/02/14 1948)  famotidine (PEPCID)  20-0.9 MG/50ML-% IVPB (  Stopped 03/02/14 1852)  iohexol (OMNIPAQUE) 300 MG/ML solution 50 mL (50 mLs Oral Contrast Given 03/02/14 1856)  iohexol (OMNIPAQUE) 300 MG/ML solution 100 mL (100 mLs Intravenous Contrast Given 03/02/14 1947)    Results for orders placed during the hospital encounter of 03/02/14  URINALYSIS, ROUTINE W REFLEX MICROSCOPIC      Result Value Ref Range   Color, Urine YELLOW  YELLOW   APPearance CLEAR  CLEAR   Specific Gravity, Urine 1.043 (*) 1.005 - 1.030   pH 6.0  5.0 - 8.0   Glucose, UA >1000 (*) NEGATIVE mg/dL   Hgb urine dipstick NEGATIVE  NEGATIVE   Bilirubin Urine NEGATIVE  NEGATIVE   Ketones, ur NEGATIVE  NEGATIVE mg/dL   Protein, ur NEGATIVE  NEGATIVE mg/dL   Urobilinogen, UA 0.2  0.0 - 1.0 mg/dL   Nitrite NEGATIVE  NEGATIVE   Leukocytes, UA NEGATIVE  NEGATIVE  PREGNANCY, URINE      Result Value Ref Range   Preg Test, Ur NEGATIVE  NEGATIVE  LIPASE, BLOOD      Result Value Ref Range   Lipase 39  11 - 59 U/L  COMPREHENSIVE METABOLIC PANEL       Result Value Ref Range   Sodium 134 (*) 137 - 147 mEq/L   Potassium 4.5  3.7 - 5.3 mEq/L   Chloride 96  96 - 112 mEq/L   CO2 26  19 - 32 mEq/L   Glucose, Bld 398 (*) 70 - 99 mg/dL   BUN 11  6 - 23 mg/dL   Creatinine, Ser 2.13  0.50 - 1.10 mg/dL   Calcium 08.6  8.4 - 57.8 mg/dL   Total Protein 7.8  6.0 - 8.3 g/dL   Albumin 3.8  3.5 - 5.2 g/dL   AST 19  0 - 37 U/L   ALT 27  0 - 35 U/L   Alkaline Phosphatase 82  39 - 117 U/L   Total Bilirubin 0.5  0.3 - 1.2 mg/dL   GFR calc non Af Amer >90  >90 mL/min   GFR calc Af Amer >90  >90 mL/min  CBC WITH DIFFERENTIAL      Result Value Ref Range   WBC 9.2  4.0 - 10.5 K/uL   RBC 5.04  3.87 - 5.11 MIL/uL   Hemoglobin 15.9 (*) 12.0 - 15.0 g/dL   HCT 46.9  62.9 - 52.8 %   MCV 85.3  78.0 - 100.0 fL   MCH 31.5  26.0 - 34.0 pg   MCHC 37.0 (*) 30.0 - 36.0 g/dL   RDW 41.3  24.4 - 01.0 %   Platelets 382  150 - 400 K/uL   Neutrophils Relative % 60  43 - 77 %   Neutro Abs 5.5  1.7 - 7.7 K/uL   Lymphocytes Relative 32  12 - 46 %   Lymphs Abs 2.9  0.7 - 4.0 K/uL   Monocytes Relative 5  3 - 12 %   Monocytes Absolute 0.5  0.1 - 1.0 K/uL   Eosinophils Relative 3  0 - 5 %   Eosinophils Absolute 0.2  0.0 - 0.7 K/uL   Basophils Relative 1  0 - 1 %   Basophils Absolute 0.1  0.0 - 0.1 K/uL  URINE MICROSCOPIC-ADD ON      Result Value Ref Range   Squamous Epithelial / LPF RARE  RARE   WBC, UA 0-2  <3 WBC/hpf   RBC / HPF 7-10  <3 RBC/hpf  Bacteria, UA FEW (*) RARE   Urine-Other MUCOUS PRESENT     Dg Chest 2 View  03/02/2014   CLINICAL DATA:  Mid chest pain, abdomen pain, and shortness of breath.  EXAM: CHEST  2 VIEW  COMPARISON:  01/07/2013  FINDINGS: The heart size and mediastinal contours are within normal limits. Both lungs are clear. The visualized skeletal structures are unremarkable.  IMPRESSION: Normal chest.   Electronically Signed   By: Geanie Cooley M.D.   On: 03/02/2014 20:01   Ct Abdomen Pelvis W Contrast  03/02/2014   CLINICAL DATA:   Epigastric pain with nausea and vomiting.  EXAM: CT ABDOMEN AND PELVIS WITH CONTRAST  TECHNIQUE: Multidetector CT imaging of the abdomen and pelvis was performed using the standard protocol following bolus administration of intravenous contrast.  CONTRAST:  50mL OMNIPAQUE IOHEXOL 300 MG/ML SOLN PO, OMNIPAQUE IOHEXOL 300 MG/ML SOLNIV  COMPARISON:  None.  FINDINGS: The gallbladder has been removed. Liver, spleen, pancreas, adrenal glands, and kidneys are normal. The bowel is normal including the terminal ileum and appendix. Uterus and ovaries are normal except for 17 mm cyst on the right ovary which is probably a prominent follicle. There is no free fluid in the pelvis. No free air in the abdomen. No osseous abnormality.  IMPRESSION: Benign appearing abdomen and pelvis.   Electronically Signed   By: Geanie Cooley M.D.   On: 03/02/2014 20:10       MDM   Final diagnoses:  Gastritis    Patient symptoms consistent with gastritis. Workup without significant findings. No marked leukocytosis no significant anemia no significant electrolyte abnormalities. Lipase not elevated not consistent with pancreatitis. Patient improved with treatment in the emergency department. Patient has a history of diabetes blood sugar elevated at 398 no evidence of acidosis.  Patient's chest x-ray was negative for pneumonia or other abnormalities. The CT scan of the abdomen had no acute findings. Symptoms most likely consistent with gastritis.  I personally performed the services described in this documentation, which was scribed in my presence. The recorded information has been reviewed and is accurate.     Shelda Jakes, MD 03/08/14 [phone]

## 2014-03-02 NOTE — ED Notes (Signed)
Pt c/o abd pain with nausea and severe " heart burn" pt reports heavy drinking on sat symptoms started sun am

## 2014-03-02 NOTE — Discharge Instructions (Signed)
Gastritis, Adult Gastritis is soreness and puffiness (inflammation) of the lining of the stomach. If you do not get help, gastritis can cause bleeding and sores (ulcers) in the stomach. HOME CARE   Only take medicine as told by your doctor.  If you were given antibiotic medicines, take them as told. Finish the medicines even if you start to feel better.  Drink enough fluids to keep your pee (urine) clear or pale yellow.  Avoid foods and drinks that make your problems worse. Foods you may want to avoid include:  Caffeine or alcohol.  Chocolate.  Mint.  Garlic and onions.  Spicy foods.  Citrus fruits, including oranges, [place], or limes.  Food containing tomatoes, including sauce, chili, salsa, and pizza.  Fried and fatty foods.  Eat small meals throughout the day instead of large meals. GET HELP RIGHT AWAY IF:   You have black or dark red poop (stools).  You throw up (vomit) blood. It may look like coffee grounds.  You cannot keep fluids down.  Your belly (abdominal) pain gets worse.  You have a fever.  You do not feel better after 1 week.  You have any other questions or concerns. MAKE SURE YOU:   Understand these instructions.  Will watch your condition.  Will get help right away if you are not doing well or get worse. Document Released: 04/30/2008 Document Revised: 02/04/2012 Document Reviewed: 12/26/2011 [facility] WingExit[facility] Patient Information 2014 [place], [place].  Medications provided as rectum. Take the Pepcid for sure for the next 2 weeks. Phenergan as needed for nausea and vomiting. Hydrocodone is for pain. Lab workup and CT scan here today without any significant findings. If not improved over the next 2 days followup with your regular Dr. If symptoms don't improve at all over the next 2 weeks may require GI medicine consultation and possible upper endoscopy to take a look at the stomach for possible ulcer.

## 2014-07-11 ENCOUNTER — Encounter (HOSPITAL_BASED_OUTPATIENT_CLINIC_OR_DEPARTMENT_OTHER): Payer: Self-pay | Admitting: Emergency Medicine

## 2014-07-11 ENCOUNTER — Emergency Department (HOSPITAL_BASED_OUTPATIENT_CLINIC_OR_DEPARTMENT_OTHER)
Admission: EM | Admit: 2014-07-11 | Discharge: 2014-07-11 | Disposition: A | Discharge status: Home / Self Care | Payer: Medicaid Other | Attending: Emergency Medicine | Admitting: Emergency Medicine

## 2014-07-11 DIAGNOSIS — Z79899 Other long term (current) drug therapy: Secondary | ICD-10-CM | POA: Insufficient documentation

## 2014-07-11 DIAGNOSIS — Z9089 Acquired absence of other organs: Secondary | ICD-10-CM | POA: Insufficient documentation

## 2014-07-11 DIAGNOSIS — Z792 Long term (current) use of antibiotics: Secondary | ICD-10-CM | POA: Insufficient documentation

## 2014-07-11 DIAGNOSIS — B379 Candidiasis, unspecified: Secondary | ICD-10-CM | POA: Diagnosis not present

## 2014-07-11 DIAGNOSIS — R109 Unspecified abdominal pain: Secondary | ICD-10-CM | POA: Insufficient documentation

## 2014-07-11 DIAGNOSIS — J45909 Unspecified asthma, uncomplicated: Secondary | ICD-10-CM | POA: Diagnosis not present

## 2014-07-11 DIAGNOSIS — N3000 Acute cystitis without hematuria: Secondary | ICD-10-CM | POA: Diagnosis not present

## 2014-07-11 DIAGNOSIS — N3001 Acute cystitis with hematuria: Secondary | ICD-10-CM

## 2014-07-11 DIAGNOSIS — Z9889 Other specified postprocedural states: Secondary | ICD-10-CM | POA: Diagnosis not present

## 2014-07-11 DIAGNOSIS — E119 Type 2 diabetes mellitus without complications: Secondary | ICD-10-CM | POA: Diagnosis not present

## 2014-07-11 DIAGNOSIS — Z3202 Encounter for pregnancy test, result negative: Secondary | ICD-10-CM | POA: Diagnosis not present

## 2014-07-11 LAB — CBC WITH DIFFERENTIAL/PLATELET
BASOS PCT: 1 % (ref 0–1)
Basophils Absolute: 0.1 10*3/uL (ref 0.0–0.1)
Eosinophils Absolute: 0.2 10*3/uL (ref 0.0–0.7)
Eosinophils Relative: 2 % (ref 0–5)
HCT: 43.4 % (ref 36.0–46.0)
Hemoglobin: 15.6 g/dL — ABNORMAL HIGH (ref 12.0–15.0)
LYMPHS PCT: 31 % (ref 12–46)
Lymphs Abs: 2.7 10*3/uL (ref 0.7–4.0)
MCH: 30.4 pg (ref 26.0–34.0)
MCHC: 35.9 g/dL (ref 30.0–36.0)
MCV: 84.6 fL (ref 78.0–100.0)
Monocytes Absolute: 0.4 10*3/uL (ref 0.1–1.0)
Monocytes Relative: 5 % (ref 3–12)
NEUTROS ABS: 5.4 10*3/uL (ref 1.7–7.7)
NEUTROS PCT: 62 % (ref 43–77)
PLATELETS: 341 10*3/uL (ref 150–400)
RBC: 5.13 MIL/uL — ABNORMAL HIGH (ref 3.87–5.11)
RDW: 13 % (ref 11.5–15.5)
WBC: 8.7 10*3/uL (ref 4.0–10.5)

## 2014-07-11 LAB — URINE MICROSCOPIC-ADD ON

## 2014-07-11 LAB — PREGNANCY, URINE: Preg Test, Ur: NEGATIVE

## 2014-07-11 LAB — BASIC METABOLIC PANEL
Anion gap: 12 (ref 5–15)
BUN: 8 mg/dL (ref 6–23)
CHLORIDE: 94 meq/L — AB (ref 96–112)
CO2: 27 mEq/L (ref 19–32)
CREATININE: 0.5 mg/dL (ref 0.50–1.10)
Calcium: 9.9 mg/dL (ref 8.4–10.5)
GFR calc non Af Amer: >90 mL/min (ref >90)
GLUCOSE: 335 mg/dL — AB (ref 70–99)
POTASSIUM: 4.4 meq/L (ref 3.7–5.3)
Sodium: 133 mEq/L — ABNORMAL LOW (ref 137–147)

## 2014-07-11 LAB — URINALYSIS, ROUTINE W REFLEX MICROSCOPIC
BILIRUBIN URINE: NEGATIVE
Ketones, ur: 15 mg/dL — AB
Nitrite: NEGATIVE
PH: 6 (ref 5.0–8.0)
Protein, ur: NEGATIVE mg/dL
SPECIFIC GRAVITY, URINE: 1.044 — AB (ref 1.005–1.030)
Urobilinogen, UA: 0.2 mg/dL (ref 0.0–1.0)

## 2014-07-11 MED ORDER — FLUCONAZOLE 50 MG PO TABS
150.0000 mg | ORAL_TABLET | Freq: Once | ORAL | Status: AC
  Administered 2014-07-11: 150 mg via ORAL
  Filled 2014-07-11 (×2): qty 1

## 2014-07-11 MED ORDER — CEPHALEXIN 500 MG PO CAPS: 500.0000 mg | ORAL_CAPSULE | Freq: Four times a day (QID) | ORAL | Status: DC

## 2014-07-11 MED ORDER — PHENAZOPYRIDINE HCL 200 MG PO TABS: 200.0000 mg | ORAL_TABLET | Freq: Three times a day (TID) | ORAL | Status: DC

## 2014-07-11 NOTE — ED Provider Notes (Signed)
Medical screening examination/treatment/procedure(s) were performed by non-physician practitioner and as supervising physician I was immediately available for consultation/collaboration.   EKG Interpretation None       Arrie Borrelli, MD 07/11/14 2359 

## 2014-07-11 NOTE — ED Provider Notes (Signed)
CSN: [id number]     Arrival date & time 07/11/14  1540 History   First MD Initiated Contact with Patient 07/11/14 1711     Chief Complaint  Patient presents with  . Abdominal Pain     (Consider location/radiation/quality/duration/timing/severity/associated sxs/prior Treatment) HPI Comments: Patient is a 33 year old female with a past medical history diabetes and asthma who presents to the emergency department complaining of suprapubic abdominal pain, "bladder pressure", dysuria and pain in her "pee-pee hole" x1 weeks, worsening over the past 2 days. Patient reports increased urinary frequency, urgency and dysuria. States this feels similar to acute urinary tract infection in the past. States she feels warm, however unsure if fever. Admits to nausea without vomiting. States she also has a small amount of white vaginal discharge that she believes is a yeast infection. Denies vaginal bleeding. No aggravating or alleviating factors.  Patient is a 33 y.o. female presenting with abdominal pain. The history is provided by the patient.  Abdominal Pain Associated symptoms: dysuria and nausea     Past Medical History  Diagnosis Date  . Diabetes mellitus without complication   . Asthma    Past Surgical History  Procedure Laterality Date  . Cesarean section    . Tonsillectomy    . Cholecystectomy    . Tooth extraction    . Cyst excision     No family history on file. History  Substance Use Topics  . Smoking status: Never Smoker   . Smokeless tobacco: Not on file  . Alcohol Use: No   OB History   Grav Para Term Preterm Abortions TAB SAB Ect Mult Living                 Review of Systems  Gastrointestinal: Positive for nausea and abdominal pain.  Genitourinary: Positive for dysuria, urgency and frequency.  All other systems reviewed and are negative.     Allergies  Review of patient's allergies indicates no known allergies.  Home Medications   Prior to Admission medications    Medication Sig Start Date End Date Taking? Authorizing Provider  cephALEXin (KEFLEX) 500 MG capsule Take 1 capsule (500 mg total) by mouth 4 (four) times daily. 07/11/14   Trevor Maceobyn M Albert, PA-C  erythromycin ophthalmic ointment Place a 1/2 inch ribbon of ointment into the lower eyelid. 10/03/13   April K Palumbo-Rasch, MD  famotidine (PEPCID) 20 MG tablet Take 1 tablet (20 mg total) by mouth 2 (two) times daily. 03/02/14   Vanetta MuldersScott Zackowski, MD  glipiZIDE (GLUCOTROL) 10 MG tablet Take 10 mg by mouth 2 (two) times daily before a meal.    Historical Provider, MD  HYDROcodone-acetaminophen (NORCO/VICODIN) 5-325 MG per tablet Take 1-2 tablets by mouth every 6 (six) hours as needed for moderate pain. 03/02/14   Vanetta MuldersScott Zackowski, MD  ibuprofen (ADVIL,MOTRIN) 600 MG tablet Take 1 tablet (600 mg total) by mouth every 6 (six) hours as needed for pain. 04/07/13   April K Palumbo-Rasch, MD  ibuprofen (ADVIL,MOTRIN) 800 MG tablet Take 1 tablet (800 mg total) by mouth every 8 (eight) hours as needed for pain. 03/27/13   Charles B. Bernette MayersSheldon, MD  metFORMIN (GLUCOPHAGE) 500 MG tablet Take 500 mg by mouth 2 (two) times daily with a meal.    Historical Provider, MD  nitrofurantoin, macrocrystal-monohydrate, (MACROBID) 100 MG capsule Take 1 capsule (100 mg total) by mouth 2 (two) times daily. X 7 days 04/07/13   April K Palumbo-Rasch, MD  oxyCODONE-acetaminophen (PERCOCET/ROXICET) 5-325 MG per tablet Take 1-2 tablets  by mouth every 6 (six) hours as needed for pain. 03/27/13   Charles B. Bernette Mayers, MD  phenazopyridine (PYRIDIUM) 200 MG tablet Take 1 tablet (200 mg total) by mouth 3 (three) times daily as needed for pain. 04/07/13   April K Palumbo-Rasch, MD  phenazopyridine (PYRIDIUM) 200 MG tablet Take 1 tablet (200 mg total) by mouth 3 (three) times daily. 07/11/14   Trevor Mace, PA-C  promethazine (PHENERGAN) 25 MG tablet Take 1 tablet (25 mg total) by mouth every 6 (six) hours as needed for nausea or vomiting. 03/02/14   Vanetta Mulders, MD   BP 124/80  Pulse 95  Temp(Src) 98 F (36.7 C) (Oral)  Resp 18  Ht 5\' 7"  (1.702 m)  Wt 204 lb (92.534 kg)  BMI 31.94 kg/m2  SpO2 100% Physical Exam  Nursing note and vitals reviewed. Constitutional: She is oriented to person, place, and time. She appears well-developed and well-nourished. No distress.  HENT:  Head: Normocephalic and atraumatic.  Mouth/Throat: Oropharynx is clear and moist.  Eyes: Conjunctivae are normal.  Neck: Normal range of motion. Neck supple.  Cardiovascular: Normal rate, regular rhythm and normal heart sounds.   Pulmonary/Chest: Effort normal and breath sounds normal.  Abdominal: Soft. Normal appearance and bowel sounds are normal. She exhibits no distension. There is tenderness in the suprapubic area. There is no rigidity, no rebound, no guarding and no CVA tenderness.  No peritoneal signs.  Musculoskeletal: Normal range of motion. She exhibits no edema.  Neurological: She is alert and oriented to person, place, and time.  Skin: Skin is warm and dry. She is not diaphoretic.  Psychiatric: She has a normal mood and affect. Her behavior is normal.    ED Course  Procedures (including critical care time) Labs Review Labs Reviewed  URINALYSIS, ROUTINE W REFLEX MICROSCOPIC - Abnormal; Notable for the following:    APPearance CLOUDY (*)    Specific Gravity, Urine 1.044 (*)    Glucose, UA >1000 (*)    Hgb urine dipstick MODERATE (*)    Ketones, ur 15 (*)    Leukocytes, UA SMALL (*)    All other components within normal limits  URINE MICROSCOPIC-ADD ON - Abnormal; Notable for the following:    Bacteria, UA MANY (*)    All other components within normal limits  CBC WITH DIFFERENTIAL - Abnormal; Notable for the following:    RBC 5.13 (*)    Hemoglobin 15.6 (*)    All other components within normal limits  BASIC METABOLIC PANEL - Abnormal; Notable for the following:    Sodium 133 (*)    Chloride 94 (*)    Glucose, Bld 335 (*)    All other  components within normal limits  PREGNANCY, URINE    Imaging Review No results found.   EKG Interpretation None      MDM   Final diagnoses:  Acute cystitis with hematuria  Yeast infection   Patient presenting with UTI symptoms. She is nontoxic appearing and in no apparent distress. Afebrile. Vital signs stable. Urine positive for infection. Yeast also present. Diflucan given in emergency department. Will treat with Keflex and pyridium. Stable for d/c. F/u with PCP. Return precautions given. Patient states understanding of treatment care plan and is agreeable.   Trevor Mace, PA-C 07/11/14 1746

## 2014-07-11 NOTE — Discharge Instructions (Signed)
Take antibiotic to completion. But pyridium as directed as needed for burning.  Urinary Tract Infection Urinary tract infections (UTIs) can develop anywhere along your urinary tract. Your urinary tract is your body's drainage system for removing wastes and extra water. Your urinary tract includes two kidneys, two ureters, a bladder, and a urethra. Your kidneys are a pair of bean-shaped organs. Each kidney is about the size of your fist. They are located below your ribs, one on each side of your spine. CAUSES Infections are caused by microbes, which are microscopic organisms, including fungi, viruses, and bacteria. These organisms are so small that they can only be seen through a microscope. Bacteria are the microbes that most commonly cause UTIs. SYMPTOMS  Symptoms of UTIs may vary by age and gender of the patient and by the location of the infection. Symptoms in young women typically include a frequent and intense urge to urinate and a painful, burning feeling in the bladder or urethra during urination. Older women and men are more likely to be tired, shaky, and weak and have muscle aches and abdominal pain. A fever may mean the infection is in your kidneys. Other symptoms of a kidney infection include pain in your back or sides below the ribs, nausea, and vomiting. DIAGNOSIS To diagnose a UTI, your caregiver will ask you about your symptoms. Your caregiver also will ask to provide a urine sample. The urine sample will be tested for bacteria and white blood cells. White blood cells are made by your body to help fight infection. TREATMENT  Typically, UTIs can be treated with medication. Because most UTIs are caused by a bacterial infection, they usually can be treated with the use of antibiotics. The choice of antibiotic and length of treatment depend on your symptoms and the type of bacteria causing your infection. HOME CARE INSTRUCTIONS  If you were prescribed antibiotics, take them exactly as your  caregiver instructs you. Finish the medication even if you feel better after you have only taken some of the medication.  Drink enough water and fluids to keep your urine clear or pale yellow.  Avoid caffeine, tea, and carbonated beverages. They tend to irritate your bladder.  Empty your bladder often. Avoid holding urine for long periods of time.  Empty your bladder before and after sexual intercourse.  After a bowel movement, women should cleanse from front to back. Use each tissue only once. SEEK MEDICAL CARE IF:   You have back pain.  You develop a fever.  Your symptoms do not begin to resolve within 3 days. SEEK IMMEDIATE MEDICAL CARE IF:   You have severe back pain or lower abdominal pain.  You develop chills.  You have nausea or vomiting.  You have continued burning or discomfort with urination. MAKE SURE YOU:   Understand these instructions.  Will watch your condition.  Will get help right away if you are not doing well or get worse. Document Released: 08/22/2005 Document Revised: 05/13/2012 Document Reviewed: 12/21/2011 [facility] Patient Information 2015 [place], [place]. This information is not intended to replace advice given to you by your health care provider. Make sure you discuss any questions you have with your health care provider.

## 2014-07-11 NOTE — ED Notes (Signed)
Patient c/o lower abd pain and pain in her "pee-pee hole".

## 2014-07-28 ENCOUNTER — Encounter (HOSPITAL_BASED_OUTPATIENT_CLINIC_OR_DEPARTMENT_OTHER): Payer: Self-pay | Admitting: Emergency Medicine

## 2014-07-28 ENCOUNTER — Emergency Department (HOSPITAL_BASED_OUTPATIENT_CLINIC_OR_DEPARTMENT_OTHER)
Admission: EM | Admit: 2014-07-28 | Discharge: 2014-07-28 | Disposition: A | Discharge status: Home / Self Care | Payer: Medicaid Other | Attending: Emergency Medicine | Admitting: Emergency Medicine

## 2014-07-28 DIAGNOSIS — Z794 Long term (current) use of insulin: Secondary | ICD-10-CM | POA: Insufficient documentation

## 2014-07-28 DIAGNOSIS — L02219 Cutaneous abscess of trunk, unspecified: Secondary | ICD-10-CM | POA: Insufficient documentation

## 2014-07-28 DIAGNOSIS — L03319 Cellulitis of trunk, unspecified: Secondary | ICD-10-CM

## 2014-07-28 DIAGNOSIS — Z792 Long term (current) use of antibiotics: Secondary | ICD-10-CM | POA: Insufficient documentation

## 2014-07-28 DIAGNOSIS — L723 Sebaceous cyst: Secondary | ICD-10-CM | POA: Insufficient documentation

## 2014-07-28 DIAGNOSIS — Z79899 Other long term (current) drug therapy: Secondary | ICD-10-CM | POA: Diagnosis not present

## 2014-07-28 DIAGNOSIS — L720 Epidermal cyst: Secondary | ICD-10-CM

## 2014-07-28 DIAGNOSIS — J45909 Unspecified asthma, uncomplicated: Secondary | ICD-10-CM | POA: Diagnosis not present

## 2014-07-28 DIAGNOSIS — E119 Type 2 diabetes mellitus without complications: Secondary | ICD-10-CM | POA: Diagnosis not present

## 2014-07-28 MED ORDER — SULFAMETHOXAZOLE-TRIMETHOPRIM 800-160 MG PO TABS: 1.0000 | ORAL_TABLET | Freq: Two times a day (BID) | ORAL | Status: DC

## 2014-07-28 NOTE — Discharge Instructions (Signed)
Epidermal Cyst An epidermal cyst is sometimes called a sebaceous cyst, epidermal inclusion cyst, or infundibular cyst. These cysts usually contain a substance that looks "pasty" or "cheesy" and may have a bad smell. This substance is a protein called keratin. Epidermal cysts are usually found on the face, neck, or trunk. They may also occur in the vaginal area or other parts of the genitalia of both men and women. Epidermal cysts are usually small, painless, slow-growing bumps or lumps that move freely under the skin. It is important not to try to pop them. This may cause an infection and lead to tenderness and swelling. CAUSES  Epidermal cysts may be caused by a deep penetrating injury to the skin or a plugged hair follicle, often associated with acne. SYMPTOMS  Epidermal cysts can become inflamed and cause:  Redness.  Tenderness.  Increased temperature of the skin over the bumps or lumps.  Grayish-white, bad smelling material that drains from the bump or lump. DIAGNOSIS  Epidermal cysts are easily diagnosed by your caregiver during an exam. Rarely, a tissue sample (biopsy) may be taken to rule out other conditions that may resemble epidermal cysts. TREATMENT   Epidermal cysts often get better and disappear on their own. They are rarely ever cancerous.  If a cyst becomes infected, it may become inflamed and tender. This may require opening and draining the cyst. Treatment with antibiotics may be necessary. When the infection is gone, the cyst may be removed with minor surgery.  Small, inflamed cysts can often be treated with antibiotics or by injecting steroid medicines.  Sometimes, epidermal cysts become large and bothersome. If this happens, surgical removal in your caregiver's office may be necessary. HOME CARE INSTRUCTIONS  Only take over-the-counter or prescription medicines as directed by your caregiver.  Take your antibiotics as directed. Finish them even if you start to feel  better. SEEK MEDICAL CARE IF:   Your cyst becomes tender, red, or swollen.  Your condition is not improving or is getting worse.  You have any other questions or concerns. MAKE SURE YOU:  Understand these instructions.  Will watch your condition.  Will get help right away if you are not doing well or get worse. Document Released: 10/13/2004 Document Revised: 02/04/2012 Document Reviewed: 05/21/2011 [employer] Patient Information 2015 [employer]. This information is not intended to replace advice given to you by your health care provider. Make sure you discuss any questions you have with your health care provider.  

## 2014-07-28 NOTE — ED Notes (Signed)
Reports abscess formed out chest with some drainage noted

## 2014-07-28 NOTE — ED Provider Notes (Signed)
CSN: [id number]     Arrival date & time 07/28/14  0301 History   First MD Initiated Contact with Patient 07/28/14 0320     Chief Complaint  Patient presents with  . Abscess     (Consider location/radiation/quality/duration/timing/severity/associated sxs/prior Treatment) HPI 33 year old female presents with an abscess or cyst on her chest wall. She states over the last 3-4 days his been increasing in size and pain. She states a white like drainage came out of it today. Denies fevers, vomiting, or redness of the skin. She's been having a bump there for the last couple years but seems to have gotten worse the last few days. She's had cysts like this removed in the past. Rates the pain as moderate to severe.  Past Medical History  Diagnosis Date  . Diabetes mellitus without complication   . Asthma    Past Surgical History  Procedure Laterality Date  . Cesarean section    . Tonsillectomy    . Cholecystectomy    . Tooth extraction    . Cyst excision     History reviewed. No pertinent family history. History  Substance Use Topics  . Smoking status: Never Smoker   . Smokeless tobacco: Not on file  . Alcohol Use: No   OB History   Grav Para Term Preterm Abortions TAB SAB Ect Mult Living                 Review of Systems  Constitutional: Negative for fever.  Gastrointestinal: Negative for vomiting.  Skin: Negative for color change and wound.  All other systems reviewed and are negative.     Allergies  Review of patient's allergies indicates no known allergies.  Home Medications   Prior to Admission medications   Medication Sig Start Date End Date Taking? Authorizing Provider  insulin detemir (LEVEMIR) 100 UNIT/ML injection Inject into the skin at bedtime.   Yes Historical Provider, MD  metFORMIN (GLUCOPHAGE) 500 MG tablet Take 500 mg by mouth 2 (two) times daily with a meal.   Yes Historical Provider, MD  cephALEXin (KEFLEX) 500 MG capsule Take 1 capsule (500 mg total) by  mouth 4 (four) times daily. 07/11/14   Trevor Mace, PA-C  erythromycin ophthalmic ointment Place a 1/2 inch ribbon of ointment into the lower eyelid. 10/03/13   April K Palumbo-Rasch, MD  famotidine (PEPCID) 20 MG tablet Take 1 tablet (20 mg total) by mouth 2 (two) times daily. 03/02/14   Vanetta Mulders, MD  glipiZIDE (GLUCOTROL) 10 MG tablet Take 10 mg by mouth 2 (two) times daily before a meal.    Historical Provider, MD  HYDROcodone-acetaminophen (NORCO/VICODIN) 5-325 MG per tablet Take 1-2 tablets by mouth every 6 (six) hours as needed for moderate pain. 03/02/14   Vanetta Mulders, MD  ibuprofen (ADVIL,MOTRIN) 600 MG tablet Take 1 tablet (600 mg total) by mouth every 6 (six) hours as needed for pain. 04/07/13   April K Palumbo-Rasch, MD  ibuprofen (ADVIL,MOTRIN) 800 MG tablet Take 1 tablet (800 mg total) by mouth every 8 (eight) hours as needed for pain. 03/27/13   Charles B. Bernette Mayers, MD  nitrofurantoin, macrocrystal-monohydrate, (MACROBID) 100 MG capsule Take 1 capsule (100 mg total) by mouth 2 (two) times daily. X 7 days 04/07/13   April K Palumbo-Rasch, MD  oxyCODONE-acetaminophen (PERCOCET/ROXICET) 5-325 MG per tablet Take 1-2 tablets by mouth every 6 (six) hours as needed for pain. 03/27/13   Charles B. Bernette Mayers, MD  phenazopyridine (PYRIDIUM) 200 MG tablet Take 1 tablet (200  mg total) by mouth 3 (three) times daily as needed for pain. 04/07/13   April K Palumbo-Rasch, MD  phenazopyridine (PYRIDIUM) 200 MG tablet Take 1 tablet (200 mg total) by mouth 3 (three) times daily. 07/11/14   Trevor Mace, PA-C  promethazine (PHENERGAN) 25 MG tablet Take 1 tablet (25 mg total) by mouth every 6 (six) hours as needed for nausea or vomiting. 03/02/14   Vanetta Mulders, MD  sulfamethoxazole-trimethoprim (SEPTRA DS) 800-160 MG per tablet Take 1 tablet by mouth every 12 (twelve) hours. 07/28/14   Audree Camel, MD   BP 112/79  Pulse 92  Temp(Src) 98 F (36.7 C) (Oral)  Resp 18  Ht  (1.702 m)  Wt 202 lb  (91.627 kg)  BMI 31.63 kg/m2  SpO2 100%  LMP 07/27/2014 Physical Exam  Nursing note and vitals reviewed. Constitutional: She is oriented to person, place, and time. She appears well-developed and well-nourished.  HENT:  Head: Normocephalic and atraumatic.  Right Ear: External ear normal.  Left Ear: External ear normal.  Nose: Nose normal.  Eyes: Right eye exhibits no discharge. Left eye exhibits no discharge.  Cardiovascular: Normal rate.   Pulmonary/Chest: Effort normal. She exhibits tenderness.    Neurological: She is alert and oriented to person, place, and time.  Skin: Skin is warm and dry.    ED Course  Procedures (including critical care time) Labs Review Labs Reviewed - No data to display  Imaging Review No results found.   EKG Interpretation None      MDM   Final diagnoses:  Epidermoid cyst of skin of chest    Patient's area of swelling is consistent with a cyst. There is no erythema or pointing or signs of abscess. The area is not firm or indurated. I discussed possible treatments, including incision and drainage, needle drainage, or removal by more specialized professional at a later time. This time patient prefers to have her removed later the one to make sure it was not infected. Given her history of diabetes in the increasing size over last few days I will treat with oral antibiotics in case this is becoming infected. She declines wanting it to be opened up at this time. I discussed strict return precautions with the patient including fever, erythema, drainage, or other concerning signs.    Audree Camel, MD 07/28/14 [phone]

## 2014-09-06 ENCOUNTER — Emergency Department (HOSPITAL_BASED_OUTPATIENT_CLINIC_OR_DEPARTMENT_OTHER)
Admission: EM | Admit: 2014-09-06 | Discharge: 2014-09-06 | Disposition: A | Discharge status: Home / Self Care | Payer: Medicaid Other | Attending: Emergency Medicine | Admitting: Emergency Medicine

## 2014-09-06 ENCOUNTER — Encounter (HOSPITAL_BASED_OUTPATIENT_CLINIC_OR_DEPARTMENT_OTHER): Payer: Self-pay | Admitting: Emergency Medicine

## 2014-09-06 DIAGNOSIS — Z79899 Other long term (current) drug therapy: Secondary | ICD-10-CM | POA: Insufficient documentation

## 2014-09-06 DIAGNOSIS — H6091 Unspecified otitis externa, right ear: Secondary | ICD-10-CM

## 2014-09-06 DIAGNOSIS — Z794 Long term (current) use of insulin: Secondary | ICD-10-CM | POA: Diagnosis not present

## 2014-09-06 DIAGNOSIS — E119 Type 2 diabetes mellitus without complications: Secondary | ICD-10-CM | POA: Diagnosis not present

## 2014-09-06 DIAGNOSIS — J45909 Unspecified asthma, uncomplicated: Secondary | ICD-10-CM | POA: Diagnosis not present

## 2014-09-06 DIAGNOSIS — H9201 Otalgia, right ear: Secondary | ICD-10-CM | POA: Diagnosis present

## 2014-09-06 LAB — CBG MONITORING, ED: Glucose-Capillary: 401 mg/dL — ABNORMAL HIGH (ref 70–99)

## 2014-09-06 MED ORDER — HYDROCODONE-ACETAMINOPHEN 5-325 MG PO TABS
2.0000 | ORAL_TABLET | Freq: Once | ORAL | Status: AC
  Administered 2014-09-06: 2 via ORAL
  Filled 2014-09-06: qty 2

## 2014-09-06 MED ORDER — INSULIN REGULAR HUMAN 100 UNIT/ML IJ SOLN
8.0000 [IU] | Freq: Once | INTRAMUSCULAR | Status: AC
  Administered 2014-09-06: 8 [IU] via SUBCUTANEOUS
  Filled 2014-09-06: qty 1

## 2014-09-06 MED ORDER — NEOMYCIN-COLIST-HC-THONZONIUM 3.3-3-10-0.5 MG/ML OT SUSP
4.0000 [drp] | Freq: Once | OTIC | Status: AC
  Administered 2014-09-06: 4 [drp] via OTIC
  Filled 2014-09-06: qty 5

## 2014-09-06 MED ORDER — NEOMYCIN-POLYMYXIN-HC 3.5-10000-1 OT SUSP: 4.0000 [drp] | Freq: Three times a day (TID) | OTIC | Status: DC

## 2014-09-06 MED ORDER — HYDROCODONE-ACETAMINOPHEN 5-325 MG PO TABS: 1.0000 | ORAL_TABLET | ORAL | Status: DC | PRN

## 2014-09-06 MED ORDER — ANTIPYRINE-BENZOCAINE 5.4-1.4 % OT SOLN
3.0000 [drp] | OTIC | Status: DC | PRN
  Administered 2014-09-06: 4 [drp] via OTIC
  Filled 2014-09-06: qty 10

## 2014-09-06 MED ORDER — ANTIPYRINE-BENZOCAINE 5.4-1.4 % OT SOLN: 3.0000 [drp] | OTIC | Status: DC | PRN

## 2014-09-06 NOTE — ED Provider Notes (Signed)
CSN: [id number]     Arrival date & time 09/06/14  0029 History   First MD Initiated Contact with Patient 09/06/14 0411     Chief Complaint  Patient presents with  . Otalgia     (Consider location/radiation/quality/duration/timing/severity/associated sxs/prior Treatment) HPI 33 year old female presents to emergency room with right ear pain.  Patient reports pain has been ongoing for 2 days.  While at work tonight ago, she had your blood thin and noted she had irritation.  She was pushing around on the outside of her ear when she felt a pop on the ear canal and some fluid ran out.  Since that time she has had pain to the outside every year.  She denies any fever chills nausea vomiting or diarrhea.  She has no redness or swelling in and around the ear.  Patient is a diabetic who is poorly controlled on her current regimen. Past Medical History  Diagnosis Date  . Diabetes mellitus without complication   . Asthma    Past Surgical History  Procedure Laterality Date  . Cesarean section    . Tonsillectomy    . Cholecystectomy    . Tooth extraction    . Cyst excision     No family history on file. History  Substance Use Topics  . Smoking status: Never Smoker   . Smokeless tobacco: Not on file  . Alcohol Use: No   OB History   Grav Para Term Preterm Abortions TAB SAB Ect Mult Living                 Review of Systems   See History of Present Illness; otherwise all other systems are reviewed and negative  Allergies  Review of patient's allergies indicates no known allergies.  Home Medications   Prior to Admission medications   Medication Sig Start Date End Date Taking? Authorizing Provider  antipyrine-benzocaine Lyla Son(AURALGAN) otic solution Place 3-4 drops into the right ear every 2 (two) hours as needed for ear pain. 09/06/14   Olivia Mackielga M Katharin Schneider, MD  cephALEXin (KEFLEX) 500 MG capsule Take 1 capsule (500 mg total) by mouth 4 (four) times daily. 07/11/14   Kathrynn Speedobyn M Hess, PA-C   erythromycin ophthalmic ointment Place a 1/2 inch ribbon of ointment into the lower eyelid. 10/03/13   April K Palumbo-Rasch, MD  famotidine (PEPCID) 20 MG tablet Take 1 tablet (20 mg total) by mouth 2 (two) times daily. 03/02/14   Vanetta MuldersScott Zackowski, MD  glipiZIDE (GLUCOTROL) 10 MG tablet Take 10 mg by mouth 2 (two) times daily before a meal.    Historical Provider, MD  HYDROcodone-acetaminophen (NORCO/VICODIN) 5-325 MG per tablet Take 1-2 tablets by mouth every 6 (six) hours as needed for moderate pain. 03/02/14   Vanetta MuldersScott Zackowski, MD  HYDROcodone-acetaminophen (NORCO/VICODIN) 5-325 MG per tablet Take 1-2 tablets by mouth every 4 (four) hours as needed for moderate pain or severe pain. 09/06/14   Olivia Mackielga M Courtland Coppa, MD  ibuprofen (ADVIL,MOTRIN) 600 MG tablet Take 1 tablet (600 mg total) by mouth every 6 (six) hours as needed for pain. 04/07/13   April K Palumbo-Rasch, MD  ibuprofen (ADVIL,MOTRIN) 800 MG tablet Take 1 tablet (800 mg total) by mouth every 8 (eight) hours as needed for pain. 03/27/13   Charles B. Bernette MayersSheldon, MD  insulin detemir (LEVEMIR) 100 UNIT/ML injection Inject into the skin at bedtime.    Historical Provider, MD  metFORMIN (GLUCOPHAGE) 500 MG tablet Take 500 mg by mouth 2 (two) times daily with a meal.  Historical Provider, MD  neomycin-polymyxin-hydrocortisone (CORTISPORIN) 3.5-10000-1 otic suspension Place 4 drops into the right ear 3 (three) times daily. 09/06/14   Olivia Mackielga M Cosimo Schertzer, MD  nitrofurantoin, macrocrystal-monohydrate, (MACROBID) 100 MG capsule Take 1 capsule (100 mg total) by mouth 2 (two) times daily. X 7 days 04/07/13   April K Palumbo-Rasch, MD  oxyCODONE-acetaminophen (PERCOCET/ROXICET) 5-325 MG per tablet Take 1-2 tablets by mouth every 6 (six) hours as needed for pain. 03/27/13   Charles B. Bernette MayersSheldon, MD  phenazopyridine (PYRIDIUM) 200 MG tablet Take 1 tablet (200 mg total) by mouth 3 (three) times daily as needed for pain. 04/07/13   April K Palumbo-Rasch, MD  phenazopyridine (PYRIDIUM)  200 MG tablet Take 1 tablet (200 mg total) by mouth 3 (three) times daily. 07/11/14   Kathrynn Speedobyn M Hess, PA-C  promethazine (PHENERGAN) 25 MG tablet Take 1 tablet (25 mg total) by mouth every 6 (six) hours as needed for nausea or vomiting. 03/02/14   Vanetta MuldersScott Zackowski, MD  sulfamethoxazole-trimethoprim (SEPTRA DS) 800-160 MG per tablet Take 1 tablet by mouth every 12 (twelve) hours. 07/28/14   Audree CamelScott T Goldston, MD   BP 141/83  Pulse 102  Temp(Src) 98 F (36.7 C) (Oral)  Resp 18  Ht 5\' 7"  (1.702 m)  Wt 200 lb (90.719 kg)  BMI 31.32 kg/m2  SpO2 98%  LMP 09/02/2014 Physical Exam  Nursing note and vitals reviewed. Constitutional: She is oriented to person, place, and time. She appears well-developed and well-nourished. She appears distressed.  HENT:  Head: Normocephalic and atraumatic.  Left Ear: External ear normal.  Nose: Nose normal.  Mouth/Throat: Oropharynx is clear and moist.  Right ear with purulent debris in the ear canal.  TM looks fine no redness or bulging.  Patient is significantly tender with use of the otoscope within the ear but no abscess or discrete lesion noted  Eyes: Conjunctivae and EOM are normal. Pupils are equal, round, and reactive to light.  Neck: Normal range of motion. Neck supple. No JVD present. No tracheal deviation present. No thyromegaly present.  Cardiovascular: Normal rate, regular rhythm, normal heart sounds and intact distal pulses.  Exam reveals no gallop and no friction rub.   No murmur heard. Pulmonary/Chest: Effort normal and breath sounds normal. No stridor. No respiratory distress. She has no wheezes. She has no rales. She exhibits no tenderness.  Abdominal: Soft. Bowel sounds are normal. She exhibits no distension and no mass. There is no tenderness. There is no rebound and no guarding.  Musculoskeletal: Normal range of motion. She exhibits no edema and no tenderness.  Lymphadenopathy:    She has no cervical adenopathy.  Neurological: She is alert and  oriented to person, place, and time. She displays normal reflexes. She exhibits normal muscle tone. Coordination normal.  Skin: Skin is warm and dry. No rash noted. No erythema. No pallor.  Psychiatric: She has a normal mood and affect. Her behavior is normal. Judgment and thought content normal.    ED Course  Procedures (including critical care time) Labs Review Labs Reviewed  CBG MONITORING, ED - Abnormal; Notable for the following:    Glucose-Capillary 401 (*)    All other components within normal limits    Imaging Review No results found.   EKG Interpretation None      MDM   Final diagnoses:  Otitis externa of right ear    33 year old female with an otitis externa, possible ruptured cyst/abscess/pimple, although I do not visualize it.  Plan to treat with a topical  anesthetic and Cortisporin.  Patient thoroughly updated on reasons for return especially given ear infection in a patient who has a poorly controlled diabetic.  She reports that she will return if she has any worsening.  Patient overall does not look septic or ill, she has no mastoid tenderness, no swelling of the ear itself.    Olivia Mackie, MD 09/06/14 [phone]

## 2014-09-06 NOTE — ED Notes (Signed)
Pt reports right ear pain that started two days ago.  Reports pain as sharp.

## 2014-09-06 NOTE — Discharge Instructions (Signed)
Please see your doctor in 3-5 days for recheck of your right ear.  Diabetics can frequently have serious infections in an around the ear.  At this time no serious infection is seen on your exam, but it is important to use the medications as prescribed and have your ear seen again soon.   Otitis Externa Otitis externa is a bacterial or fungal infection of the outer ear canal. This is the area from the eardrum to the outside of the ear. Otitis externa is sometimes called "swimmer's ear." CAUSES  Possible causes of infection include:  Swimming in dirty water.  Moisture remaining in the ear after swimming or bathing.  Mild injury (trauma) to the ear.  Objects stuck in the ear (foreign body).  Cuts or scrapes (abrasions) on the outside of the ear. SIGNS AND SYMPTOMS  The first symptom of infection is often itching in the ear canal. Later signs and symptoms may include swelling and redness of the ear canal, ear pain, and yellowish-white fluid (pus) coming from the ear. The ear pain may be worse when pulling on the earlobe. DIAGNOSIS  Your health care provider will perform a physical exam. A sample of fluid may be taken from the ear and examined for bacteria or fungi. TREATMENT  Antibiotic ear drops are often given for 10 to 14 days. Treatment may also include pain medicine or corticosteroids to reduce itching and swelling. HOME CARE INSTRUCTIONS   Apply antibiotic ear drops to the ear canal as prescribed by your health care provider.  Take medicines only as directed by your health care provider.  If you have diabetes, follow any additional treatment instructions from your health care provider.  Keep all follow-up visits as directed by your health care provider. PREVENTION   Keep your ear dry. Use the corner of a towel to absorb water out of the ear canal after swimming or bathing.  Avoid scratching or putting objects inside your ear. This can damage the ear canal or remove the  protective wax that lines the canal. This makes it easier for bacteria and fungi to grow.  Avoid swimming in lakes, polluted water, or poorly chlorinated pools.  You may use ear drops made of rubbing alcohol and vinegar after swimming. Combine equal parts of white vinegar and alcohol in a bottle. Put 3 or 4 drops into each ear after swimming. SEEK MEDICAL CARE IF:   You have a fever.  Your ear is still red, swollen, painful, or draining pus after 3 days.  Your redness, swelling, or pain gets worse.  You have a severe headache.  You have redness, swelling, pain, or tenderness in the area behind your ear. MAKE SURE YOU:   Understand these instructions.  Will watch your condition.  Will get help right away if you are not doing well or get worse. Document Released: 11/12/2005 Document Revised: 03/29/2014 Document Reviewed: 11/29/2011 [facility] Patient Information 2015 [place], [place]. This information is not intended to replace advice given to you by your health care provider. Make sure you discuss any questions you have with your health care provider.

## 2014-10-29 ENCOUNTER — Encounter (HOSPITAL_BASED_OUTPATIENT_CLINIC_OR_DEPARTMENT_OTHER): Payer: Self-pay | Admitting: Emergency Medicine

## 2014-10-29 ENCOUNTER — Emergency Department (HOSPITAL_BASED_OUTPATIENT_CLINIC_OR_DEPARTMENT_OTHER)
Admission: EM | Admit: 2014-10-29 | Discharge: 2014-10-29 | Disposition: A | Discharge status: Home / Self Care | Payer: Medicaid Other | Attending: Emergency Medicine | Admitting: Emergency Medicine

## 2014-10-29 DIAGNOSIS — J45909 Unspecified asthma, uncomplicated: Secondary | ICD-10-CM | POA: Insufficient documentation

## 2014-10-29 DIAGNOSIS — Z792 Long term (current) use of antibiotics: Secondary | ICD-10-CM | POA: Insufficient documentation

## 2014-10-29 DIAGNOSIS — L729 Follicular cyst of the skin and subcutaneous tissue, unspecified: Secondary | ICD-10-CM | POA: Diagnosis present

## 2014-10-29 DIAGNOSIS — Z79899 Other long term (current) drug therapy: Secondary | ICD-10-CM | POA: Insufficient documentation

## 2014-10-29 DIAGNOSIS — E119 Type 2 diabetes mellitus without complications: Secondary | ICD-10-CM | POA: Diagnosis not present

## 2014-10-29 DIAGNOSIS — Z794 Long term (current) use of insulin: Secondary | ICD-10-CM | POA: Insufficient documentation

## 2014-10-29 DIAGNOSIS — L02213 Cutaneous abscess of chest wall: Secondary | ICD-10-CM | POA: Insufficient documentation

## 2014-10-29 MED ORDER — OXYCODONE-ACETAMINOPHEN 5-325 MG PO TABS: 1.0000 | ORAL_TABLET | Freq: Once | ORAL | Status: DC

## 2014-10-29 MED ORDER — LIDOCAINE-EPINEPHRINE 2 %-1:100000 IJ SOLN
20.0000 mL | Freq: Once | INTRAMUSCULAR | Status: AC
  Administered 2014-10-29: 20 mL via INTRADERMAL

## 2014-10-29 MED ORDER — OXYCODONE-ACETAMINOPHEN 5-325 MG PO TABS
1.0000 | ORAL_TABLET | Freq: Once | ORAL | Status: AC
  Administered 2014-10-29: 1 via ORAL

## 2014-10-29 MED ORDER — CLINDAMYCIN HCL 150 MG PO CAPS
300.0000 mg | ORAL_CAPSULE | Freq: Once | ORAL | Status: AC
  Administered 2014-10-29: 300 mg via ORAL

## 2014-10-29 NOTE — ED Notes (Signed)
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

## 2014-10-29 NOTE — ED Provider Notes (Signed)
CSN: [id number]     Arrival date & time 10/29/14  0128 History   First MD Initiated Contact with Patient 10/29/14 0159     Chief Complaint  Patient presents with  . Cyst     (Consider location/radiation/quality/duration/timing/severity/associated sxs/prior Treatment) HPI Patient was too anterior chest for the past several years. States the last 7 days the pain has begun worse. She noticed that the cyst has become erythematous. Had several pustules that were draining in the shower 3 days ago. Since has crusted over. She's had no fever or chills. No nausea or vomiting. Past Medical History  Diagnosis Date  . Diabetes mellitus without complication   . Asthma    Past Surgical History  Procedure Laterality Date  . Cesarean section    . Tonsillectomy    . Cholecystectomy    . Tooth extraction    . Cyst excision     History reviewed. No pertinent family history. History  Substance Use Topics  . Smoking status: Never Smoker   . Smokeless tobacco: Not on file  . Alcohol Use: No   OB History    No data available     Review of Systems  Constitutional: Negative for fever and chills.  Gastrointestinal: Negative for nausea and vomiting.  Skin: Positive for color change and rash.  All other systems reviewed and are negative.     Allergies  Review of patient's allergies indicates no known allergies.  Home Medications   Prior to Admission medications   Medication Sig Start Date End Date Taking? Authorizing Provider  glipiZIDE (GLUCOTROL) 10 MG tablet Take 10 mg by mouth 2 (two) times daily before a meal.   Yes Historical Provider, MD  insulin detemir (LEVEMIR) 100 UNIT/ML injection Inject into the skin at bedtime.   Yes Historical Provider, MD  metFORMIN (GLUCOPHAGE) 500 MG tablet Take 500 mg by mouth 2 (two) times daily with a meal.   Yes Historical Provider, MD  antipyrine-benzocaine Lyla Son(AURALGAN) otic solution Place 3-4 drops into the right ear every 2 (two) hours as needed for  ear pain. 09/06/14   Olivia Mackielga M Otter, MD  cephALEXin (KEFLEX) 500 MG capsule Take 1 capsule (500 mg total) by mouth 4 (four) times daily. 07/11/14   Kathrynn Speedobyn M Hess, PA-C  erythromycin ophthalmic ointment Place a 1/2 inch ribbon of ointment into the lower eyelid. 10/03/13   April K Palumbo-Rasch, MD  famotidine (PEPCID) 20 MG tablet Take 1 tablet (20 mg total) by mouth 2 (two) times daily. 03/02/14   Vanetta MuldersScott Zackowski, MD  HYDROcodone-acetaminophen (NORCO/VICODIN) 5-325 MG per tablet Take 1-2 tablets by mouth every 6 (six) hours as needed for moderate pain. 03/02/14   Vanetta MuldersScott Zackowski, MD  HYDROcodone-acetaminophen (NORCO/VICODIN) 5-325 MG per tablet Take 1-2 tablets by mouth every 4 (four) hours as needed for moderate pain or severe pain. 09/06/14   Olivia Mackielga M Otter, MD  ibuprofen (ADVIL,MOTRIN) 600 MG tablet Take 1 tablet (600 mg total) by mouth every 6 (six) hours as needed for pain. 04/07/13   April K Palumbo-Rasch, MD  ibuprofen (ADVIL,MOTRIN) 800 MG tablet Take 1 tablet (800 mg total) by mouth every 8 (eight) hours as needed for pain. 03/27/13   Charles B. Bernette MayersSheldon, MD  neomycin-polymyxin-hydrocortisone (CORTISPORIN) 3.5-10000-1 otic suspension Place 4 drops into the right ear 3 (three) times daily. 09/06/14   Olivia Mackielga M Otter, MD  nitrofurantoin, macrocrystal-monohydrate, (MACROBID) 100 MG capsule Take 1 capsule (100 mg total) by mouth 2 (two) times daily. X 7 days 04/07/13   April  Smitty CordsK Palumbo-Rasch, MD  oxyCODONE-acetaminophen (PERCOCET/ROXICET) 5-325 MG per tablet Take 1-2 tablets by mouth every 6 (six) hours as needed for pain. 03/27/13   Charles B. Bernette MayersSheldon, MD  phenazopyridine (PYRIDIUM) 200 MG tablet Take 1 tablet (200 mg total) by mouth 3 (three) times daily as needed for pain. 04/07/13   April K Palumbo-Rasch, MD  phenazopyridine (PYRIDIUM) 200 MG tablet Take 1 tablet (200 mg total) by mouth 3 (three) times daily. 07/11/14   Kathrynn Speedobyn M Hess, PA-C  promethazine (PHENERGAN) 25 MG tablet Take 1 tablet (25 mg total) by mouth  every 6 (six) hours as needed for nausea or vomiting. 03/02/14   Vanetta MuldersScott Zackowski, MD  sulfamethoxazole-trimethoprim (SEPTRA DS) 800-160 MG per tablet Take 1 tablet by mouth every 12 (twelve) hours. 07/28/14   Audree CamelScott T Goldston, MD   BP 126/80 mmHg  Pulse 106  Temp(Src) 98.3 F (36.8 C) (Oral)  Resp 18  Ht 5\' 7"  (1.702 m)  Wt 221 lb 8 oz (100.472 kg)  BMI 34.68 kg/m2  SpO2 99%  LMP 09/30/2014 Physical Exam  Constitutional: She is oriented to person, place, and time. She appears well-developed and well-nourished. No distress.  HENT:  Head: Normocephalic and atraumatic.  Mouth/Throat: Oropharynx is clear and moist.  Eyes: EOM are normal. Pupils are equal, round, and reactive to light.  Neck: Normal range of motion. Neck supple.  Cardiovascular: Normal rate and regular rhythm.   Pulmonary/Chest: Effort normal and breath sounds normal. No respiratory distress. She has no wheezes. She has no rales. She exhibits tenderness.  Abdominal: Soft. Bowel sounds are normal. She exhibits no distension and no mass. There is no tenderness. There is no rebound and no guarding.  Musculoskeletal: Normal range of motion. She exhibits no edema or tenderness.  Neurological: She is alert and oriented to person, place, and time.  Skin: Skin is warm and dry. No rash noted. No erythema.  Roughly 4 cm in diameter area of fluctuance and induration over the inferior sternum. Tenderness to palpation. There is overlying erythema but no drainage.  Psychiatric: She has a normal mood and affect. Her behavior is normal.  Nursing note and vitals reviewed.   ED Course  INCISION AND DRAINAGE Date/Time: 10/29/2014 2:57 AM Performed by: Loren RacerYELVERTON, Vertie Dibbern Authorized by: Ranae PalmsYELVERTON, Tamanna Whitson Consent: Verbal consent obtained. Type: abscess Body area: trunk Location details: chest Local anesthetic: lidocaine 1% with epinephrine Anesthetic total: 5 ml Patient sedated: no Scalpel size: 11 Incision type: single  straight Complexity: complex Drainage: purulent Drainage amount: copious Wound treatment: wound left open Packing material: none Patient tolerance: Patient tolerated the procedure well with no immediate complications   (including critical care time) Labs Review Labs Reviewed  WOUND CULTURE    Imaging Review No results found.   EKG Interpretation None      MDM   Final diagnoses:  Abscess of chest wall    There is concern for secondary infection of epidermal cysts. We'll incise and drain in the emergency department. Will likely need antibiotics and close follow-up.  Abscess incised and drained of copious purulent material. Cavity explored. Cultures taken. She was given a first dose of antibiotics in the emergency department. We'll discharge home with clindamycin. Patient been advised that she needs to return to the emergency Department in 24 hours to have the wound reevaluated. She's also been informed that if there is any change in the wound such as increased redness, swelling, warmth or any concerns she needs to return immediately.  Loren Raceravid Kaleiyah Polsky, MD 10/29/14 [phone]

## 2014-10-29 NOTE — Discharge Instructions (Signed)
Return to the emergency department early Saturday morning to have the wound reevaluated. Take antibiotics as prescribed. Return immediately for worsening swelling, redness, warmth, fever or for any concerns.  Abscess An abscess is an infected area that contains a collection of pus and debris.It can occur in almost any part of the body. An abscess is also known as a furuncle or boil. CAUSES  An abscess occurs when tissue gets infected. This can occur from blockage of oil or sweat glands, infection of hair follicles, or a minor injury to the skin. As the body tries to fight the infection, pus collects in the area and creates pressure under the skin. This pressure causes pain. People with weakened immune systems have difficulty fighting infections and get certain abscesses more often.  SYMPTOMS Usually an abscess develops on the skin and becomes a painful mass that is red, warm, and tender. If the abscess forms under the skin, you may feel a moveable soft area under the skin. Some abscesses break open (rupture) on their own, but most will continue to get worse without care. The infection can spread deeper into the body and eventually into the bloodstream, causing you to feel ill.  DIAGNOSIS  Your caregiver will take your medical history and perform a physical exam. A sample of fluid may also be taken from the abscess to determine what is causing your infection. TREATMENT  Your caregiver may prescribe antibiotic medicines to fight the infection. However, taking antibiotics alone usually does not cure an abscess. Your caregiver may need to make a small cut (incision) in the abscess to drain the pus. In some cases, gauze is packed into the abscess to reduce pain and to continue draining the area. HOME CARE INSTRUCTIONS   Only take over-the-counter or prescription medicines for pain, discomfort, or fever as directed by your caregiver.  If you were prescribed antibiotics, take them as directed. Finish them  even if you start to feel better.  If gauze is used, follow your caregiver's directions for changing the gauze.  To avoid spreading the infection:  Keep your draining abscess covered with a bandage.  Wash your hands well.  Do not share personal care items, towels, or whirlpools with others.  Avoid skin contact with others.  Keep your skin and clothes clean around the abscess.  Keep all follow-up appointments as directed by your caregiver. SEEK MEDICAL CARE IF:   You have increased pain, swelling, redness, fluid drainage, or bleeding.  You have muscle aches, chills, or a general ill feeling.  You have a fever. MAKE SURE YOU:   Understand these instructions.  Will watch your condition.  Will get help right away if you are not doing well or get worse. Document Released: 08/22/2005 Document Revised: 05/13/2012 Document Reviewed: 01/25/2012 [facility] Patient Information 2015 [place]wingehExitCare, [place]LL[place]. This information is not intended to replace advice given to you by your health care provider. Make sure you discuss any questions you have with your health care provider.

## 2014-10-31 LAB — WOUND CULTURE: Culture: NO GROWTH

## 2015-03-11 ENCOUNTER — Encounter (HOSPITAL_BASED_OUTPATIENT_CLINIC_OR_DEPARTMENT_OTHER): Payer: Self-pay

## 2015-03-11 ENCOUNTER — Emergency Department (HOSPITAL_BASED_OUTPATIENT_CLINIC_OR_DEPARTMENT_OTHER)
Admission: EM | Admit: 2015-03-11 | Discharge: 2015-03-11 | Disposition: A | Discharge status: Home / Self Care | Payer: Medicaid Other | Attending: Emergency Medicine | Admitting: Emergency Medicine

## 2015-03-11 DIAGNOSIS — Z792 Long term (current) use of antibiotics: Secondary | ICD-10-CM | POA: Insufficient documentation

## 2015-03-11 DIAGNOSIS — Z794 Long term (current) use of insulin: Secondary | ICD-10-CM | POA: Insufficient documentation

## 2015-03-11 DIAGNOSIS — J0100 Acute maxillary sinusitis, unspecified: Secondary | ICD-10-CM | POA: Diagnosis not present

## 2015-03-11 DIAGNOSIS — E119 Type 2 diabetes mellitus without complications: Secondary | ICD-10-CM | POA: Insufficient documentation

## 2015-03-11 DIAGNOSIS — Z79899 Other long term (current) drug therapy: Secondary | ICD-10-CM | POA: Diagnosis not present

## 2015-03-11 DIAGNOSIS — J45909 Unspecified asthma, uncomplicated: Secondary | ICD-10-CM | POA: Insufficient documentation

## 2015-03-11 DIAGNOSIS — M791 Myalgia: Secondary | ICD-10-CM | POA: Diagnosis not present

## 2015-03-11 DIAGNOSIS — J029 Acute pharyngitis, unspecified: Secondary | ICD-10-CM | POA: Diagnosis present

## 2015-03-11 LAB — RAPID STREP SCREEN (MED CTR MEBANE ONLY): STREPTOCOCCUS, GROUP A SCREEN (DIRECT): NEGATIVE

## 2015-03-11 MED ORDER — AMOXICILLIN-POT CLAVULANATE 875-125 MG PO TABS
1.0000 | ORAL_TABLET | Freq: Two times a day (BID) | ORAL | Status: DC
Start: 2015-03-11 — End: 2015-12-28

## 2015-03-11 MED ORDER — ACETAMINOPHEN ER 650 MG PO TBCR: 650.0000 mg | EXTENDED_RELEASE_TABLET | Freq: Three times a day (TID) | ORAL | Status: DC | PRN

## 2015-03-11 NOTE — ED Notes (Signed)
Pt reports sore throat, nasal congestion, body aches, ear pain, cough for one week.

## 2015-03-11 NOTE — ED Provider Notes (Signed)
CSN: [id number]     Arrival date & time 03/11/15  1932 History   First MD Initiated Contact with Patient 03/11/15 2046     Chief Complaint  Patient presents with  . Sore Throat     (Consider location/radiation/quality/duration/timing/severity/associated sxs/prior Treatment) Patient is a 34 y.o. female presenting with pharyngitis. The history is provided by the patient. No language interpreter was used.  Sore Throat This is a new problem. The current episode started in the past 7 days. The problem occurs constantly. The problem has been unchanged. Associated symptoms include congestion, myalgias and a sore throat. Nothing aggravates the symptoms. She has tried nothing for the symptoms. The treatment provided no relief.    Past Medical History  Diagnosis Date  . Diabetes mellitus without complication   . Asthma    Past Surgical History  Procedure Laterality Date  . Cesarean section    . Tonsillectomy    . Cholecystectomy    . Tooth extraction    . Cyst excision     History reviewed. No pertinent family history. History  Substance Use Topics  . Smoking status: Never Smoker   . Smokeless tobacco: Not on file  . Alcohol Use: No   OB History    No data available     Review of Systems  HENT: Positive for congestion and sore throat.   Musculoskeletal: Positive for myalgias.  All other systems reviewed and are negative.     Allergies  Review of patient's allergies indicates no known allergies.  Home Medications   Prior to Admission medications   Medication Sig Start Date End Date Taking? Authorizing Provider  famotidine (PEPCID) 20 MG tablet Take 1 tablet (20 mg total) by mouth 2 (two) times daily. 03/02/14  Yes Vanetta Mulders, MD  ibuprofen (ADVIL,MOTRIN) 600 MG tablet Take 1 tablet (600 mg total) by mouth every 6 (six) hours as needed for pain. 04/07/13  Yes April Palumbo, MD  ibuprofen (ADVIL,MOTRIN) 800 MG tablet Take 1 tablet (800 mg total) by mouth every 8 (eight)  hours as needed for pain. 03/27/13  Yes Susy Frizzle, MD  insulin detemir (LEVEMIR) 100 UNIT/ML injection Inject into the skin at bedtime.   Yes Historical Provider, MD  metFORMIN (GLUCOPHAGE) 500 MG tablet Take 500 mg by mouth 2 (two) times daily with a meal.   Yes Historical Provider, MD  antipyrine-benzocaine Lyla Son) otic solution Place 3-4 drops into the right ear every 2 (two) hours as needed for ear pain. 09/06/14   Marisa Severin, MD  cephALEXin (KEFLEX) 500 MG capsule Take 1 capsule (500 mg total) by mouth 4 (four) times daily. 07/11/14   Kathrynn Speed, PA-C  erythromycin ophthalmic ointment Place a 1/2 inch ribbon of ointment into the lower eyelid. 10/03/13   April Palumbo, MD  glipiZIDE (GLUCOTROL) 10 MG tablet Take 10 mg by mouth 2 (two) times daily before a meal.    Historical Provider, MD  HYDROcodone-acetaminophen (NORCO/VICODIN) 5-325 MG per tablet Take 1-2 tablets by mouth every 6 (six) hours as needed for moderate pain. 03/02/14   Vanetta Mulders, MD  HYDROcodone-acetaminophen (NORCO/VICODIN) 5-325 MG per tablet Take 1-2 tablets by mouth every 4 (four) hours as needed for moderate pain or severe pain. 09/06/14   Marisa Severin, MD  neomycin-polymyxin-hydrocortisone (CORTISPORIN) 3.5-10000-1 otic suspension Place 4 drops into the right ear 3 (three) times daily. 09/06/14   Marisa Severin, MD  nitrofurantoin, macrocrystal-monohydrate, (MACROBID) 100 MG capsule Take 1 capsule (100 mg total) by mouth 2 (two) times daily.  X 7 days 04/07/13   April Palumbo, MD  oxyCODONE-acetaminophen (PERCOCET/ROXICET) 5-325 MG per tablet Take 1-2 tablets by mouth every 6 (six) hours as needed for pain. 03/27/13   Susy Frizzleharles Sheldon, MD  phenazopyridine (PYRIDIUM) 200 MG tablet Take 1 tablet (200 mg total) by mouth 3 (three) times daily as needed for pain. 04/07/13   April Palumbo, MD  phenazopyridine (PYRIDIUM) 200 MG tablet Take 1 tablet (200 mg total) by mouth 3 (three) times daily. 07/11/14   Kathrynn Speedobyn M Hess, PA-C   promethazine (PHENERGAN) 25 MG tablet Take 1 tablet (25 mg total) by mouth every 6 (six) hours as needed for nausea or vomiting. 03/02/14   Vanetta MuldersScott Zackowski, MD  sulfamethoxazole-trimethoprim (SEPTRA DS) 800-160 MG per tablet Take 1 tablet by mouth every 12 (twelve) hours. 07/28/14   Pricilla LovelessScott Goldston, MD   BP 140/90 mmHg  Pulse 117  Temp(Src) 98.1 F (36.7 C) (Oral)  Resp 16  Ht 5\' 7"  (1.702 m)  Wt 220 lb (99.791 kg)  BMI 34.45 kg/m2  SpO2 100%  LMP 03/10/2015 Physical Exam  Constitutional: She is oriented to person, place, and time. She appears well-developed and well-nourished. No distress.  HENT:  Head: Normocephalic and atraumatic.  Mouth/Throat: Oropharynx is clear and moist. No oropharyngeal exudate.  Maxillary and frontal sinus tenderness to palpation.   Eyes: Conjunctivae and EOM are normal.  Neck: Normal range of motion.  Cardiovascular: Normal rate and regular rhythm.  Exam reveals no gallop and no friction rub.   No murmur heard. Pulmonary/Chest: Effort normal and breath sounds normal. She has no wheezes. She has no rales. She exhibits no tenderness.  Abdominal: Soft. She exhibits no distension. There is no tenderness. There is no rebound.  Musculoskeletal: Normal range of motion.  Lymphadenopathy:    She has cervical adenopathy.  Neurological: She is alert and oriented to person, place, and time. Coordination normal.  Speech is goal-oriented. Moves limbs without ataxia.   Skin: Skin is warm and dry.  Psychiatric: She has a normal mood and affect. Her behavior is normal.  Nursing note and vitals reviewed.   ED Course  Procedures (including critical care time) Labs Review Labs Reviewed  RAPID STREP SCREEN  CULTURE, GROUP A STREP    Imaging Review No results found.   EKG Interpretation None      MDM   Final diagnoses:  Acute maxillary sinusitis, recurrence not specified    9:03 PM Rapid strep negative. Patient will be treated for sinusitis with augmentin.     [name], PA-C 03/11/15 2106  Shon Batonourtney F Horton, MD 03/12/15 [phone]

## 2015-03-11 NOTE — Discharge Instructions (Signed)
Take Augmentin as directed until gone. Take tylenol as needed for body aches and fever. Refer to attached documents for more information.

## 2015-03-14 LAB — CULTURE, GROUP A STREP: Strep A Culture: NEGATIVE

## 2015-09-11 ENCOUNTER — Encounter (HOSPITAL_BASED_OUTPATIENT_CLINIC_OR_DEPARTMENT_OTHER): Payer: Self-pay | Admitting: Emergency Medicine

## 2015-09-11 ENCOUNTER — Emergency Department (HOSPITAL_BASED_OUTPATIENT_CLINIC_OR_DEPARTMENT_OTHER): Payer: Medicaid Other

## 2015-09-11 ENCOUNTER — Emergency Department (HOSPITAL_BASED_OUTPATIENT_CLINIC_OR_DEPARTMENT_OTHER)
Admission: EM | Admit: 2015-09-11 | Discharge: 2015-09-11 | Disposition: A | Discharge status: Home / Self Care | Payer: Medicaid Other | Attending: Emergency Medicine | Admitting: Emergency Medicine

## 2015-09-11 DIAGNOSIS — Z792 Long term (current) use of antibiotics: Secondary | ICD-10-CM | POA: Insufficient documentation

## 2015-09-11 DIAGNOSIS — Z79899 Other long term (current) drug therapy: Secondary | ICD-10-CM | POA: Diagnosis not present

## 2015-09-11 DIAGNOSIS — R69 Illness, unspecified: Secondary | ICD-10-CM

## 2015-09-11 DIAGNOSIS — R51 Headache: Secondary | ICD-10-CM | POA: Insufficient documentation

## 2015-09-11 DIAGNOSIS — Z794 Long term (current) use of insulin: Secondary | ICD-10-CM | POA: Insufficient documentation

## 2015-09-11 DIAGNOSIS — Z3202 Encounter for pregnancy test, result negative: Secondary | ICD-10-CM | POA: Insufficient documentation

## 2015-09-11 DIAGNOSIS — J111 Influenza due to unidentified influenza virus with other respiratory manifestations: Secondary | ICD-10-CM | POA: Diagnosis not present

## 2015-09-11 DIAGNOSIS — J45909 Unspecified asthma, uncomplicated: Secondary | ICD-10-CM | POA: Insufficient documentation

## 2015-09-11 DIAGNOSIS — E119 Type 2 diabetes mellitus without complications: Secondary | ICD-10-CM | POA: Diagnosis not present

## 2015-09-11 DIAGNOSIS — J060 Acute laryngopharyngitis: Secondary | ICD-10-CM

## 2015-09-11 DIAGNOSIS — R197 Diarrhea, unspecified: Secondary | ICD-10-CM | POA: Insufficient documentation

## 2015-09-11 DIAGNOSIS — R059 Cough, unspecified: Secondary | ICD-10-CM

## 2015-09-11 DIAGNOSIS — J029 Acute pharyngitis, unspecified: Secondary | ICD-10-CM | POA: Diagnosis present

## 2015-09-11 DIAGNOSIS — R05 Cough: Secondary | ICD-10-CM

## 2015-09-11 LAB — RAPID STREP SCREEN (MED CTR MEBANE ONLY): STREPTOCOCCUS, GROUP A SCREEN (DIRECT): NEGATIVE

## 2015-09-11 LAB — PREGNANCY, URINE: Preg Test, Ur: NEGATIVE

## 2015-09-11 MED ORDER — ONDANSETRON HCL 4 MG/2ML IJ SOLN
4.0000 mg | Freq: Once | INTRAMUSCULAR | Status: AC
  Administered 2015-09-11: 4 mg via INTRAVENOUS
  Filled 2015-09-11: qty 2

## 2015-09-11 MED ORDER — ONDANSETRON HCL 4 MG PO TABS: 4.0000 mg | ORAL_TABLET | Freq: Four times a day (QID) | ORAL | Status: DC | PRN

## 2015-09-11 MED ORDER — KETOROLAC TROMETHAMINE 30 MG/ML IJ SOLN
30.0000 mg | Freq: Once | INTRAMUSCULAR | Status: AC
  Administered 2015-09-11: 30 mg via INTRAVENOUS
  Filled 2015-09-11: qty 1

## 2015-09-11 MED ORDER — SODIUM CHLORIDE 0.9 % IV BOLUS (SEPSIS)
1000.0000 mL | Freq: Once | INTRAVENOUS | Status: AC
  Administered 2015-09-11: 1000 mL via INTRAVENOUS

## 2015-09-11 NOTE — ED Provider Notes (Signed)
CSN: [id number]     Arrival date & time 09/11/15  1419 History   First MD Initiated Contact with Patient 09/11/15 1428     Chief Complaint  Patient presents with  . URI  . Sore Throat     (Consider location/radiation/quality/duration/timing/severity/associated sxs/prior Treatment) HPI 34 year old female who presents with flulike illness. Reports three-day history of cough productive of clear sputum, congestion, sore throat, hoarseness of voice, diarrhea. Son was similarly sick prior to her. Reports that yesterday also developed 2 episodes of nonbilious nonbloody emesis. Has not had fever, but reports feeling intermittently hot, cold, and sweaty. Also complains of generalized muscle aches and pains. Complains of mild headache, but denies vision changes, photophobia, neck pain, throat swelling, stridor, difficulty swallowing saliva.    Past Medical History  Diagnosis Date  . Diabetes mellitus without complication (HCC)   . Asthma    Past Surgical History  Procedure Laterality Date  . Cesarean section    . Tonsillectomy    . Cholecystectomy    . Tooth extraction    . Cyst excision     History reviewed. No pertinent family history. Social History  Substance Use Topics  . Smoking status: Never Smoker   . Smokeless tobacco: None  . Alcohol Use: No   OB History    No data available     Review of Systems 10/14 systems reviewed and are negative other than those stated in the HPI    Allergies  Review of patient's allergies indicates no known allergies.  Home Medications   Prior to Admission medications   Medication Sig Start Date End Date Taking? Authorizing Provider  acetaminophen (TYLENOL 8 HOUR) 650 MG CR tablet Take 1 tablet (650 mg total) by mouth every 8 (eight) hours as needed for pain. 03/11/15   Kaitlyn Szekalski, PA-C  amoxicillin-clavulanate (AUGMENTIN) 875-125 MG per tablet Take 1 tablet by mouth every 12 (twelve) hours. 03/11/15   Emilia BeckKaitlyn Szekalski, PA-C   antipyrine-benzocaine Lyla Son(AURALGAN) otic solution Place 3-4 drops into the right ear every 2 (two) hours as needed for ear pain. 09/06/14   Marisa Severinlga Otter, MD  cephALEXin (KEFLEX) 500 MG capsule Take 1 capsule (500 mg total) by mouth 4 (four) times daily. 07/11/14   Kathrynn Speedobyn M Hess, PA-C  erythromycin ophthalmic ointment Place a 1/2 inch ribbon of ointment into the lower eyelid. 10/03/13   April Palumbo, MD  famotidine (PEPCID) 20 MG tablet Take 1 tablet (20 mg total) by mouth 2 (two) times daily. 03/02/14   Vanetta MuldersScott Zackowski, MD  glipiZIDE (GLUCOTROL) 10 MG tablet Take 10 mg by mouth 2 (two) times daily before a meal.    Historical Provider, MD  HYDROcodone-acetaminophen (NORCO/VICODIN) 5-325 MG per tablet Take 1-2 tablets by mouth every 6 (six) hours as needed for moderate pain. 03/02/14   Vanetta MuldersScott Zackowski, MD  HYDROcodone-acetaminophen (NORCO/VICODIN) 5-325 MG per tablet Take 1-2 tablets by mouth every 4 (four) hours as needed for moderate pain or severe pain. 09/06/14   Marisa Severinlga Otter, MD  ibuprofen (ADVIL,MOTRIN) 600 MG tablet Take 1 tablet (600 mg total) by mouth every 6 (six) hours as needed for pain. 04/07/13   April Palumbo, MD  ibuprofen (ADVIL,MOTRIN) 800 MG tablet Take 1 tablet (800 mg total) by mouth every 8 (eight) hours as needed for pain. 03/27/13   Susy Frizzleharles Sheldon, MD  insulin detemir (LEVEMIR) 100 UNIT/ML injection Inject into the skin at bedtime.    Historical Provider, MD  metFORMIN (GLUCOPHAGE) 500 MG tablet Take 500 mg by mouth 2 (two)  times daily with a meal.    Historical Provider, MD  neomycin-polymyxin-hydrocortisone (CORTISPORIN) 3.5-10000-1 otic suspension Place 4 drops into the right ear 3 (three) times daily. 09/06/14   Marisa Severin, MD  nitrofurantoin, macrocrystal-monohydrate, (MACROBID) 100 MG capsule Take 1 capsule (100 mg total) by mouth 2 (two) times daily. X 7 days 04/07/13   April Palumbo, MD  oxyCODONE-acetaminophen (PERCOCET/ROXICET) 5-325 MG per tablet Take 1-2 tablets by mouth every 6  (six) hours as needed for pain. 03/27/13   Susy Frizzle, MD  phenazopyridine (PYRIDIUM) 200 MG tablet Take 1 tablet (200 mg total) by mouth 3 (three) times daily as needed for pain. 04/07/13   April Palumbo, MD  phenazopyridine (PYRIDIUM) 200 MG tablet Take 1 tablet (200 mg total) by mouth 3 (three) times daily. 07/11/14   Kathrynn Speed, PA-C  promethazine (PHENERGAN) 25 MG tablet Take 1 tablet (25 mg total) by mouth every 6 (six) hours as needed for nausea or vomiting. 03/02/14   Vanetta Mulders, MD  sulfamethoxazole-trimethoprim (SEPTRA DS) 800-160 MG per tablet Take 1 tablet by mouth every 12 (twelve) hours. 07/28/14   Pricilla Loveless, MD   BP 128/90 mmHg  Pulse 114  Temp(Src) 98.5 F (36.9 C) (Oral)  Resp 18  Ht  (1.702 m)  Wt 223 lb 11.2 oz (101.47 kg)  BMI 35.03 kg/m2  SpO2 98%  LMP 08/15/2015 Physical Exam Physical Exam  Nursing note and vitals reviewed. Constitutional: Well developed, well nourished, non-toxic, and in no acute distress Head: Normocephalic and atraumatic.  Mouth/Throat: Oropharynx is clear. Mucous membranes appear dry. Erythematous posterior oropharynx.  Neck: Normal range of motion. Neck supple.  Cardiovascular: Tachycardic rate and regular rhythm.  No edema. Pulmonary/Chest: Effort normal and breath sounds normal.  Abdominal: Soft. There is no tenderness. There is no rebound and no guarding.  Musculoskeletal: Normal range of motion.  Neurological: Alert, no facial droop, fluent speech, moves all extremities symmetrically Skin: Skin is warm and dry.  Psychiatric: Cooperative  ED Course  Procedures (including critical care time) Labs Review Labs Reviewed  RAPID STREP SCREEN (NOT AT [facility])  CULTURE, GROUP A STREP  PREGNANCY, URINE    Imaging Review Dg Chest 2 View  09/11/2015  CLINICAL DATA:  34 year old female with history of sore throat and productive cough with green sputum. Runny nose. Shortness of breath and body aches for the past 3 days. Emesis  yesterday. EXAM: CHEST  2 VIEW COMPARISON:  Chest x-ray 06/27/2015. FINDINGS: Lung volumes are normal. No consolidative airspace disease. No pleural effusions. No pneumothorax. No pulmonary nodule or mass noted. Pulmonary vasculature and the cardiomediastinal silhouette are within normal limits. IMPRESSION: No radiographic evidence of acute cardiopulmonary disease. Electronically Signed   By: Trudie Reed M.D.   On: 09/11/2015 14:39   I have personally reviewed and evaluated these images and lab results as part of my medical decision-making.   MDM   Final diagnoses:  Influenza-like illness  Cough  Sore throat and laryngitis    34 year old female with history of diabetes who presents with flulike illness. Nontoxic and in no acute distress, but does appear unwell. His tachycardic to 110, normotensive. Afebrile. No evidence of respiratory issues. She appears dry on exam. Remainder of exam is unremarkable and nonfocal. Presentation consistent with likely viral-like illness. I discussed supportive care measures for home. X-ray shows no evidence of pneumonia and rapid strep is negative. Symptoms improved with IV fluids, antiemetics, and NSAIDs. She felt appropriate for discharge home. Strict return and follow-up instructions  are reviewed. She expressed understanding of all discharge instructions and felt comfortable to plan of care.     Lavera Guise, MD 09/11/15 [phone]

## 2015-09-11 NOTE — ED Notes (Signed)
Pt in c/o cough, sore throat, and generalized body aches x 3 days. Pt is noted to be hoarse.

## 2015-09-11 NOTE — Discharge Instructions (Signed)
Return for worsening symptoms, including worsening pain, difficulty breathing, confusion, or any other symptoms concerning to you.  Cough, Adult A cough helps to clear your throat and lungs. A cough may last only 2-3 weeks (acute), or it may last longer than 8 weeks (chronic). Many different things can cause a cough. A cough may be a sign of an illness or another medical condition. HOME CARE  Pay attention to any changes in your cough.  Take medicines only as told by your doctor.  If you were prescribed an antibiotic medicine, take it as told by your doctor. Do not stop taking it even if you start to feel better.  Talk with your doctor before you try using a cough medicine.  Drink enough fluid to keep your pee (urine) clear or pale yellow.  If the air is dry, use a cold steam vaporizer or humidifier in your home.  Stay away from things that make you cough at work or at home.  If your cough is worse at night, try using extra pillows to raise your head up higher while you sleep.  Do not smoke, and try not to be around smoke. If you need help quitting, ask your doctor.  Do not have caffeine.  Do not drink alcohol.  Rest as needed. GET HELP IF:  You have new problems (symptoms).  You cough up yellow fluid (pus).  Your cough does not get better after 2-3 weeks, or your cough gets worse.  Medicine does not help your cough and you are not sleeping well.  You have pain that gets worse or pain that is not helped with medicine.  You have a fever.  You are losing weight and you do not know why.  You have night sweats. GET HELP RIGHT AWAY IF:  You cough up blood.  You have trouble breathing.  Your heartbeat is very fast.   This information is not intended to replace advice given to you by your health care provider. Make sure you discuss any questions you have with your health care provider.   Document Released: 07/26/2011 Document Revised: 08/03/2015 Document Reviewed:  01/19/2015 Elsevier Interactive Patient Education 2016 [employer].  Laryngitis Laryngitis is swelling (inflammation) of your vocal cords. This causes hoarseness, coughing, loss of voice, sore throat, or a dry throat. When your vocal cords are inflamed, your voice sounds different. Laryngitis can be temporary (acute) or long-term (chronic). Most cases of acute laryngitis improve with time. Chronic laryngitis is laryngitis that lasts for more than three weeks. HOME CARE  Drink enough fluid to keep your pee (urine) clear or pale yellow.  Breathe in moist air. Use a humidifier if you live in a dry climate.  Take medicines only as told by your doctor.  Do not smoke cigarettes or electronic cigarettes. If you need help quitting, ask your doctor.  Talk as little as possible. Also avoid whispering, which can cause vocal strain.  Write instead of talking. Do this until your voice is back to normal. GET HELP IF:  You have a fever.  Your pain is worse.  You have trouble swallowing. GET HELP RIGHT AWAY IF:  You cough up blood.  You have trouble breathing.   This information is not intended to replace advice given to you by your health care provider. Make sure you discuss any questions you have with your health care provider.   Document Released: 11/01/2011 Document Revised: 12/03/2014 Document Reviewed: 04/27/2014 Elsevier Interactive Patient Education [employer]

## 2015-09-13 LAB — CULTURE, GROUP A STREP

## 2015-11-22 ENCOUNTER — Emergency Department (HOSPITAL_BASED_OUTPATIENT_CLINIC_OR_DEPARTMENT_OTHER): Payer: Medicaid Other

## 2015-11-22 ENCOUNTER — Encounter (HOSPITAL_BASED_OUTPATIENT_CLINIC_OR_DEPARTMENT_OTHER): Payer: Self-pay

## 2015-11-22 ENCOUNTER — Emergency Department (HOSPITAL_BASED_OUTPATIENT_CLINIC_OR_DEPARTMENT_OTHER)
Admission: EM | Admit: 2015-11-22 | Discharge: 2015-11-22 | Disposition: A | Discharge status: Home / Self Care | Payer: Medicaid Other | Attending: Emergency Medicine | Admitting: Emergency Medicine

## 2015-11-22 DIAGNOSIS — S63501A Unspecified sprain of right wrist, initial encounter: Secondary | ICD-10-CM

## 2015-11-22 DIAGNOSIS — J45909 Unspecified asthma, uncomplicated: Secondary | ICD-10-CM | POA: Diagnosis not present

## 2015-11-22 DIAGNOSIS — S93401A Sprain of unspecified ligament of right ankle, initial encounter: Secondary | ICD-10-CM | POA: Diagnosis not present

## 2015-11-22 DIAGNOSIS — Z7984 Long term (current) use of oral hypoglycemic drugs: Secondary | ICD-10-CM | POA: Insufficient documentation

## 2015-11-22 DIAGNOSIS — E119 Type 2 diabetes mellitus without complications: Secondary | ICD-10-CM | POA: Diagnosis not present

## 2015-11-22 DIAGNOSIS — S6991XA Unspecified injury of right wrist, hand and finger(s), initial encounter: Secondary | ICD-10-CM | POA: Diagnosis present

## 2015-11-22 DIAGNOSIS — Z794 Long term (current) use of insulin: Secondary | ICD-10-CM | POA: Insufficient documentation

## 2015-11-22 DIAGNOSIS — Z792 Long term (current) use of antibiotics: Secondary | ICD-10-CM | POA: Insufficient documentation

## 2015-11-22 DIAGNOSIS — Y9389 Activity, other specified: Secondary | ICD-10-CM | POA: Diagnosis not present

## 2015-11-22 DIAGNOSIS — S8391XA Sprain of unspecified site of right knee, initial encounter: Secondary | ICD-10-CM | POA: Diagnosis not present

## 2015-11-22 DIAGNOSIS — Y998 Other external cause status: Secondary | ICD-10-CM | POA: Diagnosis not present

## 2015-11-22 DIAGNOSIS — W19XXXA Unspecified fall, initial encounter: Secondary | ICD-10-CM

## 2015-11-22 DIAGNOSIS — W108XXA Fall (on) (from) other stairs and steps, initial encounter: Secondary | ICD-10-CM | POA: Diagnosis not present

## 2015-11-22 DIAGNOSIS — Z79899 Other long term (current) drug therapy: Secondary | ICD-10-CM | POA: Insufficient documentation

## 2015-11-22 DIAGNOSIS — Y9289 Other specified places as the place of occurrence of the external cause: Secondary | ICD-10-CM | POA: Diagnosis not present

## 2015-11-22 MED ORDER — IBUPROFEN 800 MG PO TABS
800.0000 mg | ORAL_TABLET | Freq: Once | ORAL | Status: AC
  Administered 2015-11-22: 800 mg via ORAL
  Filled 2015-11-22: qty 1

## 2015-11-22 NOTE — Discharge Instructions (Signed)
Ibuprofen 600 mg every 6 hours as needed for pain.  Rest.  Ice your leg for 20 minutes every 2 hours while awake for the next 2 days.  Keep your leg elevated.  Follow-up with your primary Dr. if not improving in 1 week.

## 2015-11-22 NOTE — ED Notes (Signed)
Pt verbalizes understanding of d/c instructions and denies any further needs at this time. 

## 2015-11-22 NOTE — ED Provider Notes (Signed)
CSN: [id number]     Arrival date & time 11/22/15  0241 History   First MD Initiated Contact with Patient 11/22/15 0300     Chief Complaint  Patient presents with  . Fall     (Consider location/radiation/quality/duration/timing/severity/associated sxs/prior Treatment) HPI Comments: Patient is a 34 year old female who presents with complaints of pain in her right wrist, right knee, right ankle. She reports following down 2 stairs earlier today.  Patient is a 34 y.o. female presenting with fall. The history is provided by the patient.  Fall This is a new problem. The current episode started yesterday. The problem occurs constantly. The problem has been rapidly worsening. The symptoms are aggravated by walking. Nothing relieves the symptoms. She has tried nothing for the symptoms.    Past Medical History  Diagnosis Date  . Diabetes mellitus without complication (HCC)   . Asthma    Past Surgical History  Procedure Laterality Date  . Cesarean section    . Tonsillectomy    . Cholecystectomy    . Tooth extraction    . Cyst excision     No family history on file. Social History  Substance Use Topics  . Smoking status: Never Smoker   . Smokeless tobacco: None  . Alcohol Use: No   OB History    No data available     Review of Systems  All other systems reviewed and are negative.     Allergies  Review of patient's allergies indicates no known allergies.  Home Medications   Prior to Admission medications   Medication Sig Start Date End Date Taking? Authorizing Provider  famotidine (PEPCID) 20 MG tablet Take 1 tablet (20 mg total) by mouth 2 (two) times daily. 03/02/14  Yes Vanetta Mulders, MD  glipiZIDE (GLUCOTROL) 10 MG tablet Take 10 mg by mouth 2 (two) times daily before a meal.   Yes Historical Provider, MD  insulin aspart (NOVOLOG) 100 UNIT/ML injection Inject 56 Units into the skin 2 times daily at 12 noon and 4 pm.   Yes Historical Provider, MD  insulin detemir  (LEVEMIR) 100 UNIT/ML injection Inject 80 Units into the skin at bedtime.    Yes Historical Provider, MD  metFORMIN (GLUCOPHAGE) 500 MG tablet Take 500 mg by mouth 2 (two) times daily with a meal.   Yes Historical Provider, MD  acetaminophen (TYLENOL 8 HOUR) 650 MG CR tablet Take 1 tablet (650 mg total) by mouth every 8 (eight) hours as needed for pain. 03/11/15   Kaitlyn Szekalski, PA-C  amoxicillin-clavulanate (AUGMENTIN) 875-125 MG per tablet Take 1 tablet by mouth every 12 (twelve) hours. 03/11/15   Emilia Beck, PA-C  antipyrine-benzocaine Lyla Son) otic solution Place 3-4 drops into the right ear every 2 (two) hours as needed for ear pain. 09/06/14   Marisa Severin, MD  cephALEXin (KEFLEX) 500 MG capsule Take 1 capsule (500 mg total) by mouth 4 (four) times daily. 07/11/14   Kathrynn Speed, PA-C  erythromycin ophthalmic ointment Place a 1/2 inch ribbon of ointment into the lower eyelid. 10/03/13   April Palumbo, MD  HYDROcodone-acetaminophen (NORCO/VICODIN) 5-325 MG per tablet Take 1-2 tablets by mouth every 6 (six) hours as needed for moderate pain. 03/02/14   Vanetta Mulders, MD  HYDROcodone-acetaminophen (NORCO/VICODIN) 5-325 MG per tablet Take 1-2 tablets by mouth every 4 (four) hours as needed for moderate pain or severe pain. 09/06/14   Marisa Severin, MD  ibuprofen (ADVIL,MOTRIN) 600 MG tablet Take 1 tablet (600 mg total) by mouth every 6 (six)  hours as needed for pain. 04/07/13   April Palumbo, MD  ibuprofen (ADVIL,MOTRIN) 800 MG tablet Take 1 tablet (800 mg total) by mouth every 8 (eight) hours as needed for pain. 03/27/13   Susy Frizzleharles Sheldon, MD  neomycin-polymyxin-hydrocortisone (CORTISPORIN) 3.5-10000-1 otic suspension Place 4 drops into the right ear 3 (three) times daily. 09/06/14   Marisa Severinlga Otter, MD  nitrofurantoin, macrocrystal-monohydrate, (MACROBID) 100 MG capsule Take 1 capsule (100 mg total) by mouth 2 (two) times daily. X 7 days 04/07/13   April Palumbo, MD  ondansetron (ZOFRAN) 4 MG tablet Take  1 tablet (4 mg total) by mouth every 6 (six) hours as needed for nausea or vomiting. 09/11/15   Lavera Guiseana Duo Liu, MD  oxyCODONE-acetaminophen (PERCOCET/ROXICET) 5-325 MG per tablet Take 1-2 tablets by mouth every 6 (six) hours as needed for pain. 03/27/13   Susy Frizzleharles Sheldon, MD  phenazopyridine (PYRIDIUM) 200 MG tablet Take 1 tablet (200 mg total) by mouth 3 (three) times daily as needed for pain. 04/07/13   April Palumbo, MD  phenazopyridine (PYRIDIUM) 200 MG tablet Take 1 tablet (200 mg total) by mouth 3 (three) times daily. 07/11/14   Kathrynn Speedobyn M Hess, PA-C  promethazine (PHENERGAN) 25 MG tablet Take 1 tablet (25 mg total) by mouth every 6 (six) hours as needed for nausea or vomiting. 03/02/14   Vanetta MuldersScott Zackowski, MD  sulfamethoxazole-trimethoprim (SEPTRA DS) 800-160 MG per tablet Take 1 tablet by mouth every 12 (twelve) hours. 07/28/14   Pricilla LovelessScott Goldston, MD   BP 147/97 mmHg  Pulse 99  Temp(Src) 98.1 F (36.7 C) (Oral)  Resp 18  Ht 5\' 7"  (1.702 m)  Wt 220 lb (99.791 kg)  BMI 34.45 kg/m2  SpO2 97%  LMP 11/15/2015 Physical Exam  Constitutional: She is oriented to person, place, and time. She appears well-developed and well-nourished. No distress.  HENT:  Head: Normocephalic and atraumatic.  Neck: Normal range of motion. Neck supple.  Musculoskeletal:  The right wrist, knee, and ankle all appear grossly normal. There is no swelling or deformity. She has good range of motion in all joints in all directions.  Neurological: She is alert and oriented to person, place, and time.  Skin: Skin is warm and dry. She is not diaphoretic.  Nursing note and vitals reviewed.   ED Course  Procedures (including critical care time) Labs Review Labs Reviewed - No data to display  Imaging Review No results found. I have personally reviewed and evaluated these images and lab results as part of my medical decision-making.    MDM   Final diagnoses:  None    X-rays are negative and there is no significant swelling  or deformity on exam. I suspect these are all minor sprains/strains/contusions. I will recommend ibuprofen, ice, and follow up with primary Dr. if not improving in the next week.    Geoffery Lyonsouglas Kalai Baca, MD 11/22/15 [phone]

## 2015-11-22 NOTE — ED Notes (Signed)
Pt returned from xray

## 2015-11-22 NOTE — ED Notes (Signed)
Patient transported to X-ray 

## 2015-11-22 NOTE — ED Notes (Signed)
Pt c/o right ankle, right knee and right wrist pain after falling down two steps today at noon, no swelling or trauma appreciated on exam

## 2015-12-28 ENCOUNTER — Emergency Department (HOSPITAL_BASED_OUTPATIENT_CLINIC_OR_DEPARTMENT_OTHER)
Admission: EM | Admit: 2015-12-28 | Discharge: 2015-12-28 | Disposition: A | Discharge status: Home / Self Care | Payer: Medicaid Other | Attending: Emergency Medicine | Admitting: Emergency Medicine

## 2015-12-28 ENCOUNTER — Encounter (HOSPITAL_BASED_OUTPATIENT_CLINIC_OR_DEPARTMENT_OTHER): Payer: Self-pay | Admitting: Emergency Medicine

## 2015-12-28 DIAGNOSIS — Z792 Long term (current) use of antibiotics: Secondary | ICD-10-CM | POA: Diagnosis not present

## 2015-12-28 DIAGNOSIS — E119 Type 2 diabetes mellitus without complications: Secondary | ICD-10-CM | POA: Diagnosis not present

## 2015-12-28 DIAGNOSIS — H5712 Ocular pain, left eye: Secondary | ICD-10-CM

## 2015-12-28 DIAGNOSIS — Z7952 Long term (current) use of systemic steroids: Secondary | ICD-10-CM | POA: Insufficient documentation

## 2015-12-28 DIAGNOSIS — J45909 Unspecified asthma, uncomplicated: Secondary | ICD-10-CM | POA: Diagnosis not present

## 2015-12-28 DIAGNOSIS — Z794 Long term (current) use of insulin: Secondary | ICD-10-CM | POA: Diagnosis not present

## 2015-12-28 DIAGNOSIS — Z7984 Long term (current) use of oral hypoglycemic drugs: Secondary | ICD-10-CM | POA: Insufficient documentation

## 2015-12-28 DIAGNOSIS — Z79899 Other long term (current) drug therapy: Secondary | ICD-10-CM | POA: Insufficient documentation

## 2015-12-28 MED ORDER — TETRACAINE HCL 0.5 % OP SOLN
OPHTHALMIC | Status: AC
  Administered 2015-12-28: 2 [drp]
  Filled 2015-12-28: qty 4

## 2015-12-28 MED ORDER — FLUORESCEIN SODIUM 1 MG OP STRP
ORAL_STRIP | OPHTHALMIC | Status: AC
  Administered 2015-12-28: 1
  Filled 2015-12-28: qty 1

## 2015-12-28 MED ORDER — ERYTHROMYCIN 5 MG/GM OP OINT
TOPICAL_OINTMENT | Freq: Four times a day (QID) | OPHTHALMIC | Status: DC
  Administered 2015-12-28: 05:00:00 via OPHTHALMIC
  Filled 2015-12-28: qty 3.5

## 2015-12-28 MED ORDER — HYDROCODONE-ACETAMINOPHEN 5-325 MG PO TABS: 1.0000 | ORAL_TABLET | Freq: Four times a day (QID) | ORAL | Status: DC | PRN

## 2015-12-28 MED ORDER — HYDROCODONE-ACETAMINOPHEN 5-325 MG PO TABS
1.0000 | ORAL_TABLET | Freq: Once | ORAL | Status: AC
  Administered 2015-12-28: 1 via ORAL
  Filled 2015-12-28: qty 1

## 2015-12-28 NOTE — ED Provider Notes (Signed)
CSN: [id number]     Arrival date & time 12/28/15  0436 History   First MD Initiated Contact with Patient 12/28/15 0454     Chief Complaint  Patient presents with  . Eye Pain     (Consider location/radiation/quality/duration/timing/severity/associated sxs/prior Treatment) HPI  This is a 35 year old female who awakened yesterday evening about 9 PM with pain in her left eye. She states the pain is "really bad" and feels like previous corneal abrasions. She is not aware of any trauma to her eye but did notice redness left of the cornea. Visine and Tylenol have not effectively controlled her pain.  Past Medical History  Diagnosis Date  . Diabetes mellitus without complication (HCC)   . Asthma    Past Surgical History  Procedure Laterality Date  . Cesarean section    . Tonsillectomy    . Cholecystectomy    . Tooth extraction    . Cyst excision     History reviewed. No pertinent family history. Social History  Substance Use Topics  . Smoking status: Never Smoker   . Smokeless tobacco: None  . Alcohol Use: No   OB History    No data available     Review of Systems  All other systems reviewed and are negative.   Allergies  Review of patient's allergies indicates no known allergies.  Home Medications   Prior to Admission medications   Medication Sig Start Date End Date Taking? Authorizing Provider  acetaminophen (TYLENOL 8 HOUR) 650 MG CR tablet Take 1 tablet (650 mg total) by mouth every 8 (eight) hours as needed for pain. 03/11/15   Kaitlyn Szekalski, PA-C  amoxicillin-clavulanate (AUGMENTIN) 875-125 MG per tablet Take 1 tablet by mouth every 12 (twelve) hours. 03/11/15   Emilia Beck, PA-C  antipyrine-benzocaine Lyla Son) otic solution Place 3-4 drops into the right ear every 2 (two) hours as needed for ear pain. 09/06/14   Marisa Severin, MD  cephALEXin (KEFLEX) 500 MG capsule Take 1 capsule (500 mg total) by mouth 4 (four) times daily. 07/11/14   Kathrynn Speed, PA-C   erythromycin ophthalmic ointment Place a 1/2 inch ribbon of ointment into the lower eyelid. 10/03/13   April Palumbo, MD  famotidine (PEPCID) 20 MG tablet Take 1 tablet (20 mg total) by mouth 2 (two) times daily. 03/02/14   Vanetta Mulders, MD  glipiZIDE (GLUCOTROL) 10 MG tablet Take 10 mg by mouth 2 (two) times daily before a meal.    Historical Provider, MD  HYDROcodone-acetaminophen (NORCO/VICODIN) 5-325 MG per tablet Take 1-2 tablets by mouth every 6 (six) hours as needed for moderate pain. 03/02/14   Vanetta Mulders, MD  HYDROcodone-acetaminophen (NORCO/VICODIN) 5-325 MG per tablet Take 1-2 tablets by mouth every 4 (four) hours as needed for moderate pain or severe pain. 09/06/14   Marisa Severin, MD  ibuprofen (ADVIL,MOTRIN) 600 MG tablet Take 1 tablet (600 mg total) by mouth every 6 (six) hours as needed for pain. 04/07/13   April Palumbo, MD  ibuprofen (ADVIL,MOTRIN) 800 MG tablet Take 1 tablet (800 mg total) by mouth every 8 (eight) hours as needed for pain. 03/27/13   Susy Frizzle, MD  insulin aspart (NOVOLOG) 100 UNIT/ML injection Inject 56 Units into the skin 2 times daily at 12 noon and 4 pm.    Historical Provider, MD  insulin detemir (LEVEMIR) 100 UNIT/ML injection Inject 80 Units into the skin at bedtime.     Historical Provider, MD  metFORMIN (GLUCOPHAGE) 500 MG tablet Take 500 mg by mouth 2 (  two) times daily with a meal.    Historical Provider, MD  neomycin-polymyxin-hydrocortisone (CORTISPORIN) 3.5-10000-1 otic suspension Place 4 drops into the right ear 3 (three) times daily. 09/06/14   Marisa Severin, MD  nitrofurantoin, macrocrystal-monohydrate, (MACROBID) 100 MG capsule Take 1 capsule (100 mg total) by mouth 2 (two) times daily. X 7 days 04/07/13   April Palumbo, MD  ondansetron (ZOFRAN) 4 MG tablet Take 1 tablet (4 mg total) by mouth every 6 (six) hours as needed for nausea or vomiting. 09/11/15   Lavera Guise, MD  oxyCODONE-acetaminophen (PERCOCET/ROXICET) 5-325 MG per tablet Take 1-2  tablets by mouth every 6 (six) hours as needed for pain. 03/27/13   Susy Frizzle, MD  phenazopyridine (PYRIDIUM) 200 MG tablet Take 1 tablet (200 mg total) by mouth 3 (three) times daily as needed for pain. 04/07/13   April Palumbo, MD  phenazopyridine (PYRIDIUM) 200 MG tablet Take 1 tablet (200 mg total) by mouth 3 (three) times daily. 07/11/14   Kathrynn Speed, PA-C  promethazine (PHENERGAN) 25 MG tablet Take 1 tablet (25 mg total) by mouth every 6 (six) hours as needed for nausea or vomiting. 03/02/14   Vanetta Mulders, MD  sulfamethoxazole-trimethoprim (SEPTRA DS) 800-160 MG per tablet Take 1 tablet by mouth every 12 (twelve) hours. 07/28/14   Pricilla Loveless, MD   BP 123/90 mmHg  Pulse 112  Temp(Src) 98.1 F (36.7 C) (Oral)  Resp 20  Ht  (1.702 m)  Wt 220 lb (99.791 kg)  BMI 34.45 kg/m2  SpO2 98%  LMP 12/22/2015   Physical Exam  General: Well-developed, well-nourished female in no acute distress; appearance consistent with age of record HENT: normocephalic; atraumatic Eyes: pupils equal, round and reactive to light; extraocular muscles intact; no hyphema; no hypopyon; mild conjunctival injection of the lateral bulbar conjunctiva of the left eye; no corneal abrasion seen on fluoroscein exam Neck: supple Heart: regular rate and rhythm Lungs: clear to auscultation bilaterally Abdomen: soft; nondistended Extremities: No deformity; full range of motion Neurologic: Awake, alert and oriented; motor function intact in all extremities and symmetric; no facial droop Skin: Warm and dry Psychiatric: Normal mood and affect    ED Course  Procedures (including critical care time)   MDM  Given that the pain was completely controlled with tetracaine drops I suspect a superficial injury to the eye even though a corneal abrasion was not seen. The patient has an eye doctor she can see later today or if not we will refer to the ophthalmologist on call.     Paula Libra, MD 12/28/15 [phone]

## 2015-12-28 NOTE — ED Notes (Signed)
Pt reports eye pain that began about 9 pm last night visine and tylenol ineffective denies injury or event

## 2015-12-28 NOTE — ED Notes (Addendum)
Pt unable to perform visual acuity d/t leaving eye glasses at home. Eye exam reveals reddened area at the 4 o'clock position just outside of corneal area

## 2016-03-21 ENCOUNTER — Encounter (HOSPITAL_BASED_OUTPATIENT_CLINIC_OR_DEPARTMENT_OTHER): Payer: Self-pay | Admitting: *Deleted

## 2016-03-21 ENCOUNTER — Emergency Department (HOSPITAL_BASED_OUTPATIENT_CLINIC_OR_DEPARTMENT_OTHER)
Admission: EM | Admit: 2016-03-21 | Discharge: 2016-03-21 | Disposition: A | Discharge status: Home / Self Care | Payer: Medicaid Other | Attending: Emergency Medicine | Admitting: Emergency Medicine

## 2016-03-21 DIAGNOSIS — E119 Type 2 diabetes mellitus without complications: Secondary | ICD-10-CM | POA: Diagnosis not present

## 2016-03-21 DIAGNOSIS — R112 Nausea with vomiting, unspecified: Secondary | ICD-10-CM | POA: Diagnosis present

## 2016-03-21 DIAGNOSIS — Z794 Long term (current) use of insulin: Secondary | ICD-10-CM | POA: Insufficient documentation

## 2016-03-21 DIAGNOSIS — J45909 Unspecified asthma, uncomplicated: Secondary | ICD-10-CM | POA: Diagnosis not present

## 2016-03-21 DIAGNOSIS — K529 Noninfective gastroenteritis and colitis, unspecified: Secondary | ICD-10-CM

## 2016-03-21 LAB — URINE MICROSCOPIC-ADD ON

## 2016-03-21 LAB — URINALYSIS, ROUTINE W REFLEX MICROSCOPIC
Bilirubin Urine: NEGATIVE
Glucose, UA: >1000 mg/dL — AB
Ketones, ur: NEGATIVE mg/dL
Leukocytes, UA: NEGATIVE
Nitrite: NEGATIVE
Protein, ur: 30 mg/dL — AB
Specific Gravity, Urine: 1.036 — ABNORMAL HIGH (ref 1.005–1.030)
pH: 7 (ref 5.0–8.0)

## 2016-03-21 LAB — CBC WITH DIFFERENTIAL/PLATELET
Basophils Absolute: 0.1 10*3/uL (ref 0.0–0.1)
Basophils Relative: 1 %
Eosinophils Absolute: 0.1 10*3/uL (ref 0.0–0.7)
Eosinophils Relative: 1 %
HCT: 41.5 % (ref 36.0–46.0)
Hemoglobin: 15.1 g/dL — ABNORMAL HIGH (ref 12.0–15.0)
Lymphocytes Relative: 22 %
Lymphs Abs: 1.8 10*3/uL (ref 0.7–4.0)
MCH: 30 pg (ref 26.0–34.0)
MCHC: 36.4 g/dL — ABNORMAL HIGH (ref 30.0–36.0)
MCV: 82.5 fL (ref 78.0–100.0)
Monocytes Absolute: 0.4 10*3/uL (ref 0.1–1.0)
Monocytes Relative: 5 %
Neutro Abs: 5.8 10*3/uL (ref 1.7–7.7)
Neutrophils Relative %: 71 %
Platelets: 353 10*3/uL (ref 150–400)
RBC: 5.03 MIL/uL (ref 3.87–5.11)
RDW: 13.2 % (ref 11.5–15.5)
WBC: 8.2 10*3/uL (ref 4.0–10.5)

## 2016-03-21 LAB — COMPREHENSIVE METABOLIC PANEL
ALT: 43 U/L (ref 14–54)
AST: 42 U/L — ABNORMAL HIGH (ref 15–41)
Albumin: 3.7 g/dL (ref 3.5–5.0)
Alkaline Phosphatase: 69 U/L (ref 38–126)
Anion gap: 6 (ref 5–15)
BUN: 8 mg/dL (ref 6–20)
CO2: 24 mmol/L (ref 22–32)
Calcium: 9 mg/dL (ref 8.9–10.3)
Chloride: 102 mmol/L (ref 101–111)
Creatinine, Ser: 0.55 mg/dL (ref 0.44–1.00)
GFR calc Af Amer: >60 mL/min (ref >60)
GFR calc non Af Amer: >60 mL/min (ref >60)
Glucose, Bld: 315 mg/dL — ABNORMAL HIGH (ref 65–99)
Potassium: 3.6 mmol/L (ref 3.5–5.1)
Sodium: 132 mmol/L — ABNORMAL LOW (ref 135–145)
Total Bilirubin: 0.6 mg/dL (ref 0.3–1.2)
Total Protein: 7.5 g/dL (ref 6.5–8.1)

## 2016-03-21 LAB — LIPASE, BLOOD: Lipase: 20 U/L (ref 11–51)

## 2016-03-21 LAB — CBG MONITORING, ED
GLUCOSE-CAPILLARY: 259 mg/dL — AB (ref 65–99)
Glucose-Capillary: 291 mg/dL — ABNORMAL HIGH (ref 65–99)

## 2016-03-21 MED ORDER — ONDANSETRON HCL 4 MG/2ML IJ SOLN
4.0000 mg | Freq: Once | INTRAMUSCULAR | Status: AC
  Administered 2016-03-21: 4 mg via INTRAVENOUS
  Filled 2016-03-21: qty 2

## 2016-03-21 MED ORDER — SODIUM CHLORIDE 0.9 % IV BOLUS (SEPSIS)
2000.0000 mL | Freq: Once | INTRAVENOUS | Status: AC
  Administered 2016-03-21 (×2): 1000 mL via INTRAVENOUS

## 2016-03-21 MED ORDER — PROMETHAZINE HCL 25 MG PO TABS: 25.0000 mg | ORAL_TABLET | Freq: Three times a day (TID) | ORAL | Status: DC | PRN

## 2016-03-21 MED ORDER — PROMETHAZINE HCL 25 MG PO TABS
25.0000 mg | ORAL_TABLET | Freq: Once | ORAL | Status: AC
  Administered 2016-03-21: 25 mg via ORAL
  Filled 2016-03-21: qty 1

## 2016-03-21 MED ORDER — METOCLOPRAMIDE HCL 5 MG/ML IJ SOLN
10.0000 mg | Freq: Once | INTRAMUSCULAR | Status: AC
  Administered 2016-03-21: 10 mg via INTRAVENOUS
  Filled 2016-03-21: qty 2

## 2016-03-21 NOTE — ED Provider Notes (Signed)
CSN: [id number]     Arrival date & time 03/21/16  1427 History   First MD Initiated Contact with Patient 03/21/16 1446     Chief Complaint  Patient presents with  . Emesis    HPI The patient is a 35 year old white obese female PMH of DMT2 insulin dependent w/ gastroparesis who presents due to emesis. She ate [employer] last night, woke up this morning with acute epigastric abdominal pain and has been having emesis ever since. She can not keep any liquids or solids down, endorses severe nausea as well. She denies diarrhea or changes in bowel habits. Patient has upper abdominal pain, dull aching, epigastric region. Endorses mild headaches with no lightheaded or dizziness. Denies blurred vision. The patient endorses occasional emesis from gastroparesis, however this is more significant and different than usual. Patient states she feels weak and exhausted from persistent emesis.  Past Medical History  Diagnosis Date  . Diabetes mellitus without complication (HCC)   . Asthma    Past Surgical History  Procedure Laterality Date  . Cesarean section    . Tonsillectomy    . Cholecystectomy    . Tooth extraction    . Cyst excision     History reviewed. No pertinent family history. Social History  Substance Use Topics  . Smoking status: Never Smoker   . Smokeless tobacco: None  . Alcohol Use: No   OB History    No data available     Review of Systems All other systems negative except as documented in the HPI. All pertinent positives and negatives as reviewed in the HPI.  Allergies  Review of patient's allergies indicates no known allergies.  Home Medications   Prior to Admission medications   Medication Sig Start Date End Date Taking? Authorizing Provider  HYDROcodone-acetaminophen (NORCO) 5-325 MG tablet Take 1-2 tablets by mouth every 6 (six) hours as needed (for eye pain). 12/28/15   John Molpus, MD  insulin aspart (NOVOLOG) 100 UNIT/ML injection Inject 56 Units into the skin 2 times  daily at 12 noon and 4 pm.    Historical Provider, MD  insulin detemir (LEVEMIR) 100 UNIT/ML injection Inject 80 Units into the skin at bedtime.     Historical Provider, MD  metFORMIN (GLUCOPHAGE) 500 MG tablet Take 500 mg by mouth 2 (two) times daily with a meal.    Historical Provider, MD  ondansetron (ZOFRAN) 4 MG tablet Take 1 tablet (4 mg total) by mouth every 6 (six) hours as needed for nausea or vomiting. 09/11/15   Lavera Guise, MD   BP 162/100 mmHg  Pulse 100  Temp(Src) 97.7 F (36.5 C) (Oral)  Resp 16  Ht  (1.702 m)  Wt 99.791 kg  BMI 34.45 kg/m2  SpO2 99%  LMP 03/21/2016 Physical Exam  Constitutional: She is oriented to person, place, and time. She appears well-developed and well-nourished. No distress.  HENT:  Head: Normocephalic and atraumatic.  Mouth/Throat: Oropharynx is clear and moist. No oropharyngeal exudate.  Eyes: Pupils are equal, round, and reactive to light.  Neck: Normal range of motion. Neck supple.  Cardiovascular: Normal rate, regular rhythm, normal heart sounds and intact distal pulses.  Exam reveals no gallop and no friction rub.   No murmur heard. Pulmonary/Chest: Effort normal and breath sounds normal. No respiratory distress. She has no wheezes. She has no rales.  Abdominal: Soft. Bowel sounds are normal. She exhibits no distension. There is tenderness. There is no rebound.  Significant tenderness to light  palpation of epigastric, RUQ and LUQ. Non-tender lower abdomen or pelvic region. No distension, rebound or guarding.  Lymphadenopathy:    She has no cervical adenopathy.  Neurological: She is alert and oriented to person, place, and time. She exhibits normal muscle tone. Coordination normal.  Skin: Skin is warm and dry. No rash noted. No erythema.  Psychiatric: She has a normal mood and affect. Her behavior is normal.  Nursing note and vitals reviewed.   ED Course  Procedures (including critical care time) Labs Review Labs Reviewed  CBC  WITH DIFFERENTIAL/PLATELET - Abnormal; Notable for the following:    Hemoglobin 15.1 (*)    MCHC 36.4 (*)    All other components within normal limits  CBG MONITORING, ED - Abnormal; Notable for the following:    Glucose-Capillary 291 (*)    All other components within normal limits  COMPREHENSIVE METABOLIC PANEL  LIPASE, BLOOD    Imaging Review No results found. I have personally reviewed and evaluated these images and lab results as part of my medical decision-making.    MDM   The patient presents with emesis, hx of DM & gastroparesis. IV fluids, Reglan given to hydrate patient, provide anti-emetic properties. Likely viral gastroenteritis due to onset and abdominal discomfort.   Patient is feeling better following Reglan and IV fluids.  We will check a urinalysis.  We will also give a fluid challenge prior to discharge   Charlestine NightChristopher Kerina Simoneau, PA-C 03/21/16 1638  Leta BaptistEmily Roe Nguyen, MD 03/22/16 [phone]

## 2016-03-21 NOTE — ED Provider Notes (Signed)
Continuation of care for Lindsay Love Lawyer, P.A.-C.  Patient presented to the emergency department with a complaint of nausea vomiting. It is of note that she has a history of diabetes mellitus. Labs obtained revealed no acute problem. Urinalysis returned and showed some slight elevation in the specific gravity of 1.036, with greater than 1000 mg/daL of glucose, and 30 mg/daL of protein. The urinalysis was otherwise negative for infection or acute problem.  The patient tolerated her fluids well. She was given an oral challenge, and after the promethazine was able to manage the liquids without problem. A repeat CBG was obtained at discharge and the patient is trending down from 315-259.  The patient's color is good. There is no diaphoresis. The patient is awake and alert. Take sting on her cell phone, and conversing with her visitor. Lungs are clear. Heart is regular rate and rhythm at 95 bpm. No unusual weakness noted. No gross neurologic deficits appreciated.  Patient is discharged home with instructions to follow-up closely with her primary physician, and to monitor her glucose carefully. We discussed starting with clear liquids and advancing slowly to carbohydrate modified diet. Also discussed with the patient to return if any changes, problems, or concerns.  Ivery QualeHobson Quitman Norberto, PA-C 03/21/16 1843  Cathren LaineKevin Steinl, MD 03/21/16 [phone]

## 2016-03-21 NOTE — ED Notes (Signed)
Pt able to tolerate water with no n/v. 

## 2016-03-21 NOTE — ED Notes (Signed)
Pt c/o n/v x 1 day 

## 2016-03-21 NOTE — Discharge Instructions (Signed)
Return here as needed. Follow up with your doctor. Increase your fluid intake slowly.   the foods you eat and your eating habits are very important. Choosing the right foods and drinks can help relieve diarrhea. Also, because diarrhea can last up to 7 days, you need to replace lost fluids and electrolytes (such as sodium, potassium, and chloride) in order to help prevent dehydration.  WHAT GENERAL GUIDELINES DO I NEED TO FOLLOW?  Slowly drink 1 cup (8 oz) of fluid for each episode of diarrhea. If you are getting enough fluid, your urine will be clear or pale yellow.  Eat starchy foods. Some good choices include white rice, white toast, pasta, low-fiber cereal, baked potatoes (without the skin), saltine crackers, and bagels.  Avoid large servings of any cooked vegetables.  Limit fruit to two servings per day. A serving is  cup or 1 small piece.  Choose foods with less than 2 g of fiber per serving.  Limit fats to less than 8 tsp (38 g) per day.  Avoid fried foods.  Eat foods that have probiotics in them. Probiotics can be found in certain dairy products.  Avoid foods and beverages that may increase the speed at which food moves through the stomach and intestines (gastrointestinal tract). Things to avoid include:  High-fiber foods, such as dried fruit, raw fruits and vegetables, nuts, seeds, and whole grain foods.  Spicy foods and high-fat foods.  Foods and beverages sweetened with high-fructose corn syrup, honey, or sugar alcohols such as xylitol, sorbitol, and mannitol. WHAT FOODS ARE RECOMMENDED? Grains White rice. White, JamaicaFrench, or pita breads (fresh or toasted), including plain rolls, buns, or bagels. White pasta. Saltine, soda, or graham crackers. Pretzels. Low-fiber cereal. Cooked cereals made with water (such as cornmeal, farina, or cream cereals). Plain muffins. Matzo. Melba toast. Zwieback.  Vegetables Potatoes (without the skin). Strained tomato and vegetable juices. Most  well-cooked and canned vegetables without seeds. Tender lettuce. Fruits Cooked or canned applesauce, apricots, cherries, fruit cocktail, grapefruit, peaches, pears, or plums. Fresh bananas, apples without skin, cherries, grapes, cantaloupe, grapefruit, peaches, oranges, or plums.  Meat and Other Protein Products Baked or boiled chicken. Eggs. Tofu. Fish. Seafood. Smooth peanut butter. Ground or well-cooked tender beef, ham, veal, lamb, pork, or poultry.  Dairy Plain yogurt, kefir, and unsweetened liquid yogurt. Lactose-free milk, buttermilk, or soy milk. Plain hard cheese. Beverages Sport drinks. Clear broths. Diluted fruit juices (except prune). Regular, caffeine-free sodas such as ginger ale. Water. Decaffeinated teas. Oral rehydration solutions. Sugar-free beverages not sweetened with sugar alcohols. Other Bouillon, broth, or soups made from recommended foods.  The items listed above may not be a complete list of recommended foods or beverages. Contact your dietitian for more options. WHAT FOODS ARE NOT RECOMMENDED? Grains Whole grain, whole wheat, bran, or rye breads, rolls, pastas, crackers, and cereals. Wild or brown rice. Cereals that contain more than 2 g of fiber per serving. Corn tortillas or taco shells. Cooked or dry oatmeal. Granola. Popcorn. Vegetables Raw vegetables. Cabbage, broccoli, Brussels sprouts, artichokes, baked beans, beet greens, corn, kale, legumes, peas, sweet potatoes, and yams. Potato skins. Cooked spinach and cabbage. Fruits Dried fruit, including raisins and dates. Raw fruits. Stewed or dried prunes. Fresh apples with skin, apricots, mangoes, pears, raspberries, and strawberries.  Meat and Other Protein Products Chunky peanut butter. Nuts and seeds. Beans and lentils. Tomasa BlaseBacon.  Dairy High-fat cheeses. Milk, chocolate milk, and beverages made with milk, such as milk shakes. Cream. Ice cream. Sweets and Desserts Sweet  rolls, doughnuts, and sweet breads. Pancakes  and waffles. Fats and Oils Butter. Cream sauces. Margarine. Salad oils. Plain salad dressings. Olives. Avocados.  Beverages Caffeinated beverages (such as coffee, tea, soda, or energy drinks). Alcoholic beverages. Fruit juices with pulp. Prune juice. Soft drinks sweetened with high-fructose corn syrup or sugar alcohols. Other Coconut. Hot sauce. Chili powder. Mayonnaise. Gravy. Cream-based or milk-based soups.  The items listed above may not be a complete list of foods and beverages to avoid. Contact your dietitian for more information. WHAT SHOULD I DO IF I BECOME DEHYDRATED? Diarrhea can sometimes lead to dehydration. Signs of dehydration include dark urine and dry mouth and skin. If you think you are dehydrated, you should rehydrate with an oral rehydration solution. These solutions can be purchased at pharmacies, retail stores, or online.  Drink -1 cup (120-240 mL) of oral rehydration solution each time you have an episode of diarrhea. If drinking this amount makes your diarrhea worse, try drinking smaller amounts more often. For example, drink 1-3 tsp (5-15 mL) every 5-10 minutes.  A general rule for staying hydrated is to drink 1-2 L of fluid per day. Talk to your health care provider about the specific amount you should be drinking each day. Drink enough fluids to keep your urine clear or pale yellow.   This information is not intended to replace advice given to you by your health care provider. Make sure you discuss any questions you have with your health care provider.   Document Released: 02/02/2004 Document Revised: 12/03/2014 Document Reviewed: 10/05/2013 Elsevier Interactive Patient Education [employer]

## 2016-03-24 ENCOUNTER — Encounter (HOSPITAL_BASED_OUTPATIENT_CLINIC_OR_DEPARTMENT_OTHER): Payer: Self-pay | Admitting: Emergency Medicine

## 2016-03-24 ENCOUNTER — Emergency Department (HOSPITAL_BASED_OUTPATIENT_CLINIC_OR_DEPARTMENT_OTHER)
Admission: EM | Admit: 2016-03-24 | Discharge: 2016-03-25 | Disposition: A | Discharge status: Home / Self Care | Payer: Medicaid Other | Attending: Emergency Medicine | Admitting: Emergency Medicine

## 2016-03-24 DIAGNOSIS — E119 Type 2 diabetes mellitus without complications: Secondary | ICD-10-CM | POA: Insufficient documentation

## 2016-03-24 DIAGNOSIS — J45909 Unspecified asthma, uncomplicated: Secondary | ICD-10-CM | POA: Insufficient documentation

## 2016-03-24 DIAGNOSIS — Z7984 Long term (current) use of oral hypoglycemic drugs: Secondary | ICD-10-CM | POA: Insufficient documentation

## 2016-03-24 DIAGNOSIS — Z794 Long term (current) use of insulin: Secondary | ICD-10-CM | POA: Insufficient documentation

## 2016-03-24 DIAGNOSIS — Z79899 Other long term (current) drug therapy: Secondary | ICD-10-CM | POA: Diagnosis not present

## 2016-03-24 DIAGNOSIS — H5712 Ocular pain, left eye: Secondary | ICD-10-CM | POA: Insufficient documentation

## 2016-03-24 MED ORDER — FLUORESCEIN SODIUM 1 MG OP STRP
1.0000 | ORAL_STRIP | Freq: Once | OPHTHALMIC | Status: AC
  Administered 2016-03-24: 1 via OPHTHALMIC
  Filled 2016-03-24: qty 1

## 2016-03-24 MED ORDER — NAPHAZOLINE-PHENIRAMINE 0.025-0.3 % OP SOLN: 1.0000 [drp] | OPHTHALMIC | Status: DC | PRN

## 2016-03-24 MED ORDER — TETRACAINE HCL 0.5 % OP SOLN
2.0000 [drp] | Freq: Once | OPHTHALMIC | Status: AC
  Administered 2016-03-24: 2 [drp] via OPHTHALMIC
  Filled 2016-03-24: qty 4

## 2016-03-24 NOTE — Discharge Instructions (Signed)
There does not appear to be an emergent cause for your eye discomfort at this time. Use your Naphcon drops as prescribed to help with your discomfort. Continue with warm compresses, Visine at home. Follow-up with your doctor as needed. Return to ED for any new or worsening symptoms as we discussed.

## 2016-03-24 NOTE — ED Notes (Signed)
Pt in c/o L eye pain onset approx 2 hours ago, states feels like she scratched her eye. Pain on closing and opening eye. No redness or deformity noted.

## 2016-03-24 NOTE — ED Provider Notes (Signed)
CSN: [id number]     Arrival date & time 03/24/16  2206 History   First MD Initiated Contact with Patient 03/24/16 2258     Chief Complaint  Patient presents with  . Eye Pain     (Consider location/radiation/quality/duration/timing/severity/associated sxs/prior Treatment) HPI Lindsay Love is a 35 y.o. female who comes in for evaluation of eye pain. Patient reports just prior to arrival, she was riding down the road, windows down when she felt like something flew into her left eye. She reports an onset discomfort, causing a headache. She tried Visine without relief of her symptoms. No other facial pain, numbness or weakness, eye redness. No sensitivity to light. Patient normally wears contact lenses, not wearing them now.  Past Medical History  Diagnosis Date  . Diabetes mellitus without complication (HCC)   . Asthma    Past Surgical History  Procedure Laterality Date  . Cesarean section    . Tonsillectomy    . Cholecystectomy    . Tooth extraction    . Cyst excision     History reviewed. No pertinent family history. Social History  Substance Use Topics  . Smoking status: Never Smoker   . Smokeless tobacco: None  . Alcohol Use: No   OB History    No data available     Review of Systems A 10 point review of systems was completed and was negative except for pertinent positives and negatives as mentioned in the history of present illness     Allergies  Review of patient's allergies indicates no known allergies.  Home Medications   Prior to Admission medications   Medication Sig Start Date End Date Taking? Authorizing Provider  HYDROcodone-acetaminophen (NORCO) 5-325 MG tablet Take 1-2 tablets by mouth every 6 (six) hours as needed (for eye pain). 12/28/15   John Molpus, MD  insulin aspart (NOVOLOG) 100 UNIT/ML injection Inject 56 Units into the skin 2 times daily at 12 noon and 4 pm.    Historical Provider, MD  insulin detemir (LEVEMIR) 100 UNIT/ML injection Inject 80  Units into the skin at bedtime.     Historical Provider, MD  metFORMIN (GLUCOPHAGE) 500 MG tablet Take 500 mg by mouth 2 (two) times daily with a meal.    Historical Provider, MD  naphazoline-pheniramine (NAPHCON-A) 0.025-0.3 % ophthalmic solution Place 1 drop into the left eye every 4 (four) hours as needed for irritation. 03/24/16   Joycie Peek, PA-C  ondansetron (ZOFRAN) 4 MG tablet Take 1 tablet (4 mg total) by mouth every 6 (six) hours as needed for nausea or vomiting. 09/11/15   Lavera Guise, MD  promethazine (PHENERGAN) 25 MG tablet Take 1 tablet (25 mg total) by mouth every 8 (eight) hours as needed for nausea or vomiting. 03/21/16   Charlestine Night, PA-C   BP 140/98 mmHg  Pulse 90  Temp(Src) 98.2 F (36.8 C) (Oral)  Resp 18  Ht  (1.702 m)  Wt 104.327 kg  BMI 36.01 kg/m2  SpO2 97%  LMP 03/21/2016 Physical Exam  Constitutional: She is oriented to person, place, and time. She appears well-developed and well-nourished.  HENT:  Head: Normocephalic and atraumatic.  Mouth/Throat: Oropharynx is clear and moist.  Eyes: Conjunctivae are normal. Pupils are equal, round, and reactive to light. Right eye exhibits no discharge. Left eye exhibits no discharge. No scleral icterus.  Pupils are equal, round and reactive to light. No consensual pain. No nystagmus. Extraocular movements are intact. Upon fluorescein staining of left eye, there is no corneal  abrasion. Upon lid eversion, no foreign bodies detected. No ulcerations or other abnormalities noted. No conjunctivitis or drainage  Neck: Neck supple.  Cardiovascular: Normal rate, regular rhythm and normal heart sounds.   Pulmonary/Chest: Effort normal and breath sounds normal. No respiratory distress. She has no wheezes. She has no rales.  Abdominal: Soft. There is no tenderness.  Musculoskeletal: She exhibits no tenderness.  Neurological: She is alert and oriented to person, place, and time.  Cranial Nerves II-XII grossly intact   Skin: Skin is warm and dry. No rash noted.  Psychiatric: She has a normal mood and affect.  Nursing note and vitals reviewed.   ED Course  Procedures (including critical care time) Labs Review Labs Reviewed - No data to display  Imaging Review No results found. I have personally reviewed and evaluated these images and lab results as part of my medical decision-making.   EKG Interpretation None       Visual Acuity  Right Eye Distance: 200/20 Left Eye Distance: 200/20 Bilateral Distance: 200/20 (Pt does not have her corrective lens with her..)  Right Eye Near:   Left Eye Near:    Bilateral Near:     MDM  Lindsay Love is a 35 y.o. female here for evaluation of left eye discomfort. Patient felt like she got something in her eye, while riding down the road with the windows down. No evidence of corneal abrasion or foreign body on exam. Visual acuity as above without corrective lenses. Overall unremarkable exam. Overall appears well, nontoxic, hemodynamically stable and appropriate for outpatient follow-up. Final diagnoses:  Left eye pain        Joycie PeekBenjamin Annie Saephan, PA-C 03/25/16 0036  Paula LibraJohn Molpus, MD 03/25/16 [phone]

## 2016-03-25 NOTE — ED Notes (Signed)
Pt given d/c instructions as per chart. Verbalizes understanding. No questions. Rx x 1 

## 2016-08-14 ENCOUNTER — Encounter (HOSPITAL_BASED_OUTPATIENT_CLINIC_OR_DEPARTMENT_OTHER): Payer: Self-pay | Admitting: *Deleted

## 2016-08-14 ENCOUNTER — Emergency Department (HOSPITAL_BASED_OUTPATIENT_CLINIC_OR_DEPARTMENT_OTHER)
Admission: EM | Admit: 2016-08-14 | Discharge: 2016-08-15 | Disposition: A | Discharge status: Home / Self Care | Payer: Medicaid Other | Attending: Emergency Medicine | Admitting: Emergency Medicine

## 2016-08-14 ENCOUNTER — Emergency Department (HOSPITAL_BASED_OUTPATIENT_CLINIC_OR_DEPARTMENT_OTHER): Payer: Medicaid Other

## 2016-08-14 DIAGNOSIS — E1140 Type 2 diabetes mellitus with diabetic neuropathy, unspecified: Secondary | ICD-10-CM | POA: Diagnosis not present

## 2016-08-14 DIAGNOSIS — Z794 Long term (current) use of insulin: Secondary | ICD-10-CM | POA: Insufficient documentation

## 2016-08-14 DIAGNOSIS — E1165 Type 2 diabetes mellitus with hyperglycemia: Secondary | ICD-10-CM | POA: Diagnosis not present

## 2016-08-14 DIAGNOSIS — L089 Local infection of the skin and subcutaneous tissue, unspecified: Secondary | ICD-10-CM

## 2016-08-14 DIAGNOSIS — L03032 Cellulitis of left toe: Secondary | ICD-10-CM

## 2016-08-14 DIAGNOSIS — Z7984 Long term (current) use of oral hypoglycemic drugs: Secondary | ICD-10-CM | POA: Insufficient documentation

## 2016-08-14 DIAGNOSIS — M79675 Pain in left toe(s): Secondary | ICD-10-CM | POA: Diagnosis present

## 2016-08-14 LAB — BASIC METABOLIC PANEL
Anion gap: 8 (ref 5–15)
BUN: 15 mg/dL (ref 6–20)
CALCIUM: 9.2 mg/dL (ref 8.9–10.3)
CO2: 27 mmol/L (ref 22–32)
CREATININE: 0.76 mg/dL (ref 0.44–1.00)
Chloride: 96 mmol/L — ABNORMAL LOW (ref 101–111)
Glucose, Bld: 521 mg/dL (ref 65–99)
Potassium: 4.3 mmol/L (ref 3.5–5.1)
SODIUM: 131 mmol/L — AB (ref 135–145)

## 2016-08-14 LAB — CBC WITH DIFFERENTIAL/PLATELET
BASOS ABS: 0 10*3/uL (ref 0.0–0.1)
Basophils Relative: 1 %
EOS ABS: 0.3 10*3/uL (ref 0.0–0.7)
EOS PCT: 4 %
HCT: 39.9 % (ref 36.0–46.0)
Hemoglobin: 13.9 g/dL (ref 12.0–15.0)
LYMPHS ABS: 2.3 10*3/uL (ref 0.7–4.0)
LYMPHS PCT: 29 %
MCH: 29.2 pg (ref 26.0–34.0)
MCHC: 34.8 g/dL (ref 30.0–36.0)
MCV: 83.8 fL (ref 78.0–100.0)
Monocytes Absolute: 0.5 10*3/uL (ref 0.1–1.0)
Monocytes Relative: 7 %
NEUTROS PCT: 59 %
Neutro Abs: 4.6 10*3/uL (ref 1.7–7.7)
PLATELETS: 344 10*3/uL (ref 150–400)
RBC: 4.76 MIL/uL (ref 3.87–5.11)
RDW: 13.4 % (ref 11.5–15.5)
WBC: 7.7 10*3/uL (ref 4.0–10.5)

## 2016-08-14 LAB — URINALYSIS, ROUTINE W REFLEX MICROSCOPIC
BILIRUBIN URINE: NEGATIVE
Glucose, UA: >1000 mg/dL — AB
KETONES UR: NEGATIVE mg/dL
NITRITE: NEGATIVE
Protein, ur: NEGATIVE mg/dL
SPECIFIC GRAVITY, URINE: 1.038 — AB (ref 1.005–1.030)
pH: 5.5 (ref 5.0–8.0)

## 2016-08-14 LAB — URINE MICROSCOPIC-ADD ON

## 2016-08-14 LAB — CBG MONITORING, ED: GLUCOSE-CAPILLARY: 350 mg/dL — AB (ref 65–99)

## 2016-08-14 MED ORDER — INSULIN REGULAR HUMAN 100 UNIT/ML IJ SOLN
10.0000 [IU] | Freq: Once | INTRAMUSCULAR | Status: AC
  Administered 2016-08-14: 10 [IU] via SUBCUTANEOUS
  Filled 2016-08-14: qty 1

## 2016-08-14 MED ORDER — SODIUM CHLORIDE 0.9 % IV BOLUS (SEPSIS)
1000.0000 mL | Freq: Once | INTRAVENOUS | Status: AC
  Administered 2016-08-14: 1000 mL via INTRAVENOUS

## 2016-08-14 NOTE — ED Provider Notes (Signed)
[facility] Provider Note   CSN: [id number] Arrival date & time: 08/14/16  1727  By signing my name below, I, Emmanuella Mensah, attest that this documentation has been prepared under the direction and in the presence of Roxy Horsemanobert Nakeysha Pasqual, PA-C. Electronically Signed: Angelene GiovanniEmmanuella Mensah, ED Scribe. 08/14/16. 6:43 PM.    History   Chief Complaint Chief Complaint  Patient presents with  . Foot Pain    HPI Comments: Lindsay Love is a 35 y.o. female with a hx of DM without complication (dx 16 years ago) who presents to the Emergency Department complaining of gradually worsening moderate pain and discoloration to her left 2nd toe onset this morning. She notes that the left 2nd toe is black in color. She reports associated left foot pain and numbness to all her toes. No alleviating factors noted. Pt has not tried any medications PTA. She states that her CBG is normally around 350-360 due to her hx of DM. Pt denies any fever, chills, gait problems, or generalized rash.   The history is provided by the patient. No language interpreter was used.    Past Medical History:  Diagnosis Date  . Asthma   . Diabetes mellitus without complication (HCC)     There are no active problems to display for this patient.   Past Surgical History:  Procedure Laterality Date  . CESAREAN SECTION    . CHOLECYSTECTOMY    . CYST EXCISION    . TONSILLECTOMY    . TOOTH EXTRACTION      OB History    No data available       Home Medications    Prior to Admission medications   Medication Sig Start Date End Date Taking? Authorizing Provider  HYDROcodone-acetaminophen (NORCO) 5-325 MG tablet Take 1-2 tablets by mouth every 6 (six) hours as needed (for eye pain). 12/28/15   John Molpus, MD  insulin aspart (NOVOLOG) 100 UNIT/ML injection Inject 56 Units into the skin 2 times daily at 12 noon and 4 pm.    Historical Provider, MD  insulin detemir (LEVEMIR) 100 UNIT/ML injection Inject 80 Units  into the skin at bedtime.     Historical Provider, MD  metFORMIN (GLUCOPHAGE) 500 MG tablet Take 500 mg by mouth 2 (two) times daily with a meal.    Historical Provider, MD  naphazoline-pheniramine (NAPHCON-A) 0.025-0.3 % ophthalmic solution Place 1 drop into the left eye every 4 (four) hours as needed for irritation. 03/24/16   Joycie PeekBenjamin Cartner, PA-C  ondansetron (ZOFRAN) 4 MG tablet Take 1 tablet (4 mg total) by mouth every 6 (six) hours as needed for nausea or vomiting. 09/11/15   Lavera Guiseana Duo Liu, MD  promethazine (PHENERGAN) 25 MG tablet Take 1 tablet (25 mg total) by mouth every 8 (eight) hours as needed for nausea or vomiting. 03/21/16   Charlestine Nighthristopher Lawyer, PA-C    Family History No family history on file.  Social History Social History  Substance Use Topics  . Smoking status: Never Smoker  . Smokeless tobacco: Not on file  . Alcohol use No     Allergies   Review of patient's allergies indicates no known allergies.   Review of Systems Review of Systems  Constitutional: Negative for chills and fever.  Musculoskeletal: Positive for arthralgias and joint swelling. Negative for gait problem.  Skin: Positive for color change. Negative for rash.  All other systems reviewed and are negative.    Physical Exam Updated Vital Signs BP 142/95   Pulse 105   Temp 98.4  F (36.9 C) (Oral)   Resp 16   Ht 5\' 7"  (1.702 m)   Wt 230 lb (104.3 kg)   LMP 07/18/2016   SpO2 98%   BMI 36.02 kg/m   Physical Exam  Constitutional: She is oriented to person, place, and time. She appears well-developed and well-nourished.  HENT:  Head: Normocephalic and atraumatic.  Eyes: Conjunctivae and EOM are normal. Pupils are equal, round, and reactive to light.  Neck: Normal range of motion. Neck supple.  Cardiovascular: Normal rate, regular rhythm and intact distal pulses.  Exam reveals no gallop and no friction rub.   No murmur heard. Intact distal pulses with brisk cap refill  Pulmonary/Chest:  Effort normal and breath sounds normal. No respiratory distress. She has no wheezes. She has no rales. She exhibits no tenderness.  Abdominal: Soft. Bowel sounds are normal. She exhibits no distension and no mass. There is no tenderness. There is no rebound and no guarding.  Musculoskeletal: Normal range of motion. She exhibits no edema or tenderness.  ROM and strength of left foot and toes is 5/5  Neurological: She is alert and oriented to person, place, and time.  Skin: Skin is warm and dry.  Left 2nd toe is purple and discolored, it is warm, there is no discharge  Psychiatric: She has a normal mood and affect. Her behavior is normal. Judgment and thought content normal.  Nursing note and vitals reviewed.    ED Treatments / Results  DIAGNOSTIC STUDIES: Oxygen Saturation is 98% on RA, normal by my interpretation.    COORDINATION OF CARE: 6:34 PM- Pt advised of plan for treatment and pt agrees. Pt will receive x-ray for further evaluation.    Labs (all labs ordered are listed, but only abnormal results are displayed) Labs Reviewed - No data to display  EKG  EKG Interpretation None       Radiology No results found.  Procedures Procedures (including critical care time)  Medications Ordered in ED Medications - No data to display   Initial Impression / Assessment and Plan / ED Course  Roxy Horseman, PA-C has reviewed the triage vital signs and the nursing notes.  Pertinent labs & imaging results that were available during my care of the patient were reviewed by me and considered in my medical decision making (see chart for details).  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  Will check labs and plain films.  9:54 PM Patient seen by and discussed with Dr. Rhunette Croft.  Recommends MRI for further evaluation of discolored toe to rule out osteo.  No trauma.  Plain films unremarkable.  Good pulses.  Foot is warm.  She does  not have a PCP.  Plan:  MRI pending.  If MRI is negative DC with abx and ortho follow-up.  Patient discussed with Dr. Dalene Seltzer at [facility] ED, who accepts the patient in transfer.  Final Clinical Impressions(s) / ED Diagnoses   Final diagnoses:  None    New Prescriptions New Prescriptions   No medications on file   I personally performed the services described in this documentation, which was scribed in my presence. The recorded information has been reviewed and is accurate.      Roxy Horseman, PA-C 08/14/16 2221    Derwood Kaplan, MD 08/15/16 1610

## 2016-08-14 NOTE — ED Provider Notes (Signed)
[facility] to further evaluate toe on the left foot. I have seen patient since her arrival to the ED, she is doing well. No complaints at this time. 11:45pm   MRI has been ordered of left foot w/ contrast.  4:30 am - IMPRESSION: Mild edema and contrast enhancement around the second and third toes may indicate cellulitis. No abscess. Bones appear intact. No evidence of osteomyelitis.   Electronically Signed By: Burman NievesWilliam Stevens M.D. On: 08/15/2016 04:25   Patient started on Clindamycin and asked to arrange close follow-up with her PCP or return to the ER if symptoms change or worsen.  Medications  clindamycin (CLEOCIN) capsule 300 mg (not administered)  sodium chloride 0.9 % bolus 1,000 mL (0 mLs Intravenous Stopped 08/14/16 2153)  insulin regular (NOVOLIN R,HUMULIN R) 100 units/mL injection 10 Units (10 Units Subcutaneous Given 08/14/16 2009)  gadobenate dimeglumine (MULTIHANCE) injection 20 mL (20 mLs Intravenous Contrast Given 08/15/16 0215)    I discussed results, diagnoses and plan with Hedy CamaraBrandena Peeters. They voice there understanding and questions were answered. We discussed follow-up recommendations and return precautions.    Marlon Peliffany Chevonne Bostrom, PA-C 08/15/16 [phone]    Gilda Creasehristopher J Pollina, MD 08/16/16 [phone]

## 2016-08-14 NOTE — ED Triage Notes (Signed)
Pt c/o left foot pain with 2nd toe redness and black in color

## 2016-08-14 NOTE — ED Notes (Signed)
PA at bedside [facility].

## 2016-08-15 ENCOUNTER — Emergency Department (HOSPITAL_COMMUNITY): Payer: Medicaid Other

## 2016-08-15 LAB — CBG MONITORING, ED: Glucose-Capillary: 301 mg/dL — ABNORMAL HIGH (ref 65–99)

## 2016-08-15 MED ORDER — CLINDAMYCIN HCL 150 MG PO CAPS
300.0000 mg | ORAL_CAPSULE | Freq: Once | ORAL | Status: AC
  Administered 2016-08-15: 300 mg via ORAL
  Filled 2016-08-15: qty 2

## 2016-08-15 MED ORDER — CLINDAMYCIN HCL 150 MG PO CAPS: 150.0000 mg | ORAL_CAPSULE | Freq: Four times a day (QID) | ORAL | 0 refills | Status: DC

## 2016-08-15 MED ORDER — GADOBENATE DIMEGLUMINE 529 MG/ML IV SOLN
20.0000 mL | Freq: Once | INTRAVENOUS | Status: AC
  Administered 2016-08-15: 20 mL via INTRAVENOUS

## 2016-08-15 NOTE — ED Notes (Signed)
Pt in MRI.

## 2016-08-31 ENCOUNTER — Encounter (HOSPITAL_BASED_OUTPATIENT_CLINIC_OR_DEPARTMENT_OTHER): Payer: Self-pay | Admitting: *Deleted

## 2016-08-31 ENCOUNTER — Emergency Department (HOSPITAL_BASED_OUTPATIENT_CLINIC_OR_DEPARTMENT_OTHER)
Admission: EM | Admit: 2016-08-31 | Discharge: 2016-08-31 | Disposition: A | Discharge status: Home / Self Care | Payer: Medicaid Other | Attending: Emergency Medicine | Admitting: Emergency Medicine

## 2016-08-31 DIAGNOSIS — E119 Type 2 diabetes mellitus without complications: Secondary | ICD-10-CM | POA: Diagnosis not present

## 2016-08-31 DIAGNOSIS — Z794 Long term (current) use of insulin: Secondary | ICD-10-CM | POA: Diagnosis not present

## 2016-08-31 DIAGNOSIS — Y939 Activity, unspecified: Secondary | ICD-10-CM | POA: Insufficient documentation

## 2016-08-31 DIAGNOSIS — Y999 Unspecified external cause status: Secondary | ICD-10-CM | POA: Diagnosis not present

## 2016-08-31 DIAGNOSIS — X58XXXA Exposure to other specified factors, initial encounter: Secondary | ICD-10-CM | POA: Insufficient documentation

## 2016-08-31 DIAGNOSIS — Y929 Unspecified place or not applicable: Secondary | ICD-10-CM | POA: Diagnosis not present

## 2016-08-31 DIAGNOSIS — Z7984 Long term (current) use of oral hypoglycemic drugs: Secondary | ICD-10-CM | POA: Insufficient documentation

## 2016-08-31 DIAGNOSIS — N760 Acute vaginitis: Secondary | ICD-10-CM | POA: Insufficient documentation

## 2016-08-31 DIAGNOSIS — J45909 Unspecified asthma, uncomplicated: Secondary | ICD-10-CM | POA: Insufficient documentation

## 2016-08-31 DIAGNOSIS — T192XXA Foreign body in vulva and vagina, initial encounter: Secondary | ICD-10-CM | POA: Diagnosis present

## 2016-08-31 LAB — WET PREP, GENITAL
Sperm: NONE SEEN
Trich, Wet Prep: NONE SEEN

## 2016-08-31 LAB — CBG MONITORING, ED
GLUCOSE-CAPILLARY: 415 mg/dL — AB (ref 65–99)
GLUCOSE-CAPILLARY: 313 mg/dL — AB (ref 65–99)

## 2016-08-31 LAB — CBC WITH DIFFERENTIAL/PLATELET
BASOS PCT: 1 %
Basophils Absolute: 0.1 10*3/uL (ref 0.0–0.1)
EOS ABS: 0.3 10*3/uL (ref 0.0–0.7)
EOS PCT: 3 %
HCT: 40.3 % (ref 36.0–46.0)
HEMOGLOBIN: 14.1 g/dL (ref 12.0–15.0)
Lymphocytes Relative: 35 %
Lymphs Abs: 2.7 10*3/uL (ref 0.7–4.0)
MCH: 29 pg (ref 26.0–34.0)
MCHC: 35 g/dL (ref 30.0–36.0)
MCV: 82.9 fL (ref 78.0–100.0)
Monocytes Absolute: 0.4 10*3/uL (ref 0.1–1.0)
Monocytes Relative: 6 %
NEUTROS PCT: 55 %
Neutro Abs: 4.4 10*3/uL (ref 1.7–7.7)
PLATELETS: 384 10*3/uL (ref 150–400)
RBC: 4.86 MIL/uL (ref 3.87–5.11)
RDW: 13.4 % (ref 11.5–15.5)
WBC: 7.9 10*3/uL (ref 4.0–10.5)

## 2016-08-31 LAB — BASIC METABOLIC PANEL
Anion gap: 8 (ref 5–15)
BUN: 12 mg/dL (ref 6–20)
CALCIUM: 9.3 mg/dL (ref 8.9–10.3)
CO2: 23 mmol/L (ref 22–32)
CREATININE: 0.72 mg/dL (ref 0.44–1.00)
Chloride: 99 mmol/L — ABNORMAL LOW (ref 101–111)
GLUCOSE: 504 mg/dL — AB (ref 65–99)
Potassium: 4.4 mmol/L (ref 3.5–5.1)
Sodium: 130 mmol/L — ABNORMAL LOW (ref 135–145)

## 2016-08-31 LAB — URINE MICROSCOPIC-ADD ON: WBC, UA: NONE SEEN WBC/hpf (ref 0–5)

## 2016-08-31 LAB — URINALYSIS, ROUTINE W REFLEX MICROSCOPIC
Bilirubin Urine: NEGATIVE
KETONES UR: NEGATIVE mg/dL
LEUKOCYTES UA: NEGATIVE
Nitrite: NEGATIVE
Protein, ur: NEGATIVE mg/dL
Specific Gravity, Urine: 1.036 — ABNORMAL HIGH (ref 1.005–1.030)
pH: 5.5 (ref 5.0–8.0)

## 2016-08-31 LAB — PREGNANCY, URINE: PREG TEST UR: NEGATIVE

## 2016-08-31 MED ORDER — INSULIN ASPART 100 UNIT/ML ~~LOC~~ SOLN: 35.0000 [IU] | Freq: Two times a day (BID) | SUBCUTANEOUS | 11 refills | Status: DC

## 2016-08-31 MED ORDER — SODIUM CHLORIDE 0.9 % IV BOLUS (SEPSIS)
2000.0000 mL | Freq: Once | INTRAVENOUS | Status: AC
  Administered 2016-08-31: 2000 mL via INTRAVENOUS

## 2016-08-31 MED ORDER — FLUCONAZOLE 50 MG PO TABS
150.0000 mg | ORAL_TABLET | Freq: Once | ORAL | Status: AC
  Administered 2016-08-31: 150 mg via ORAL
  Filled 2016-08-31: qty 1

## 2016-08-31 MED ORDER — INSULIN ASPART 100 UNIT/ML ~~LOC~~ SOLN
15.0000 [IU] | Freq: Once | SUBCUTANEOUS | Status: AC
  Administered 2016-08-31: 15 [IU] via SUBCUTANEOUS
  Filled 2016-08-31: qty 1

## 2016-08-31 MED ORDER — FLUCONAZOLE 150 MG PO TABS: 150.0000 mg | ORAL_TABLET | Freq: Once | ORAL | 0 refills | Status: AC

## 2016-08-31 MED ORDER — CEPHALEXIN 250 MG PO CAPS
1000.0000 mg | ORAL_CAPSULE | Freq: Once | ORAL | Status: AC
Start: 2016-08-31 — End: 2016-08-31
  Administered 2016-08-31: 1000 mg via ORAL
  Filled 2016-08-31: qty 4

## 2016-08-31 MED ORDER — INSULIN DETEMIR 100 UNIT/ML ~~LOC~~ SOLN: 100.0000 [IU] | Freq: Every day | SUBCUTANEOUS | 0 refills | Status: DC

## 2016-08-31 MED ORDER — METRONIDAZOLE 500 MG PO TABS: 500.0000 mg | ORAL_TABLET | Freq: Two times a day (BID) | ORAL | 0 refills | Status: DC

## 2016-08-31 NOTE — ED Notes (Signed)
Snacks given 

## 2016-08-31 NOTE — ED Triage Notes (Addendum)
Unable to find the string to remove her tampon. It has been in since last night. She wants to be treated for a yeast infection. States she no longer has a physician and needs to have her rx's filled while she is here.

## 2016-08-31 NOTE — Discharge Instructions (Signed)
Check your blood sugars 4 times daily, before each meal and at bedtime and write the numbers down and show that that is to your new doctor. Keep your scheduled appointment for 09/04/2016 11 AM

## 2016-08-31 NOTE — Progress Notes (Signed)
ED CM received consult from EDP [facility]ld[facility]Jacubowitz [facility] care [facility]/u, pt has had 4 ED visits in the past 6 months for similar complaints.. CM spoke with patient by phone, patient reports not having a PCP after being discharge from [facility] several months ago. Patient is a Di[place] [facility]. [facility] and services rendered, patient is agreeable with plan. Appt [facility]  to establish care Tuesday 10/10 at 11am with Dr. Venetia NightAmao. Clinic contact information provided. Patient verbalized understanding teach back done.  No further CM needs identified.

## 2016-08-31 NOTE — ED Provider Notes (Signed)
[facility]Y [facility] Provider Note   CSN: [id number] Arrival date & time: 08/31/16  1411     History   [id number] Complaint Chief Complaint  Patient presents with  . Foreign Body in Vagina    HPI Lindsay Love is a 35 y.o. female.  HPI patient patient reports she left [place] tampon in her vagina last night. She also reports that she's had a yeast infection for the past 2 weeks. Last normal menstrual period started did not 3 days ago. Denies abdominal pain. She's run out of her insulin consisting of Levemir 100 units and NovoLog 35 units twice daily. Blood sugar yesterday was 374. She did not check her blood sugar today. She denies fever denies other associated symptoms. She also reports of painful distal left second toe for the past 2 weeks. She reports she's been on antibiotics which she completed, without relief.  Past Medical History:  Diagnosis Date  . Asthma   . Diabetes mellitus without complication (HCC)     There are no active problems to display for this patient.   Past Surgical History:  Procedure Laterality Date  . CESAREAN SECTION    . CHOLECYSTECTOMY    . CYST EXCISION    . TONSILLECTOMY    . TOOTH EXTRACTION      OB History    No data available       Home Medications    Prior to Admission medications   Medication Sig Start Date End Date Taking? Authorizing Provider  insulin aspart (NOVOLOG) 100 UNIT/ML injection Inject 56 Units into the skin 2 times daily at 12 noon and 4 pm.   Yes Historical Provider, MD  insulin detemir (LEVEMIR) 100 UNIT/ML injection Inject 80 Units into the skin at bedtime.    Yes Historical Provider, MD  clindamycin (CLEOCIN) 150 MG capsule Take 1 capsule (150 mg total) by mouth every 6 (six) hours. 08/15/16   Tiffany Neva SeatGreene, PA-C  HYDROcodone-acetaminophen (NORCO) 5-325 MG tablet Take 1-2 tablets by mouth every 6 (six) hours as needed (for eye pain). 12/28/15   John Molpus, MD  metFORMIN (GLUCOPHAGE) 500 MG tablet Take 500 mg by  mouth 2 (two) times daily with a meal.    Historical Provider, MD  naphazoline-pheniramine (NAPHCON-A) 0.025-0.3 % ophthalmic solution Place 1 drop into the left eye every 4 (four) hours as needed for irritation. 03/24/16   Joycie PeekBenjamin Cartner, PA-C  ondansetron (ZOFRAN) 4 MG tablet Take 1 tablet (4 mg total) by mouth every 6 (six) hours as needed for nausea or vomiting. 09/11/15   Lavera Guiseana Duo Liu, MD  promethazine (PHENERGAN) 25 MG tablet Take 1 tablet (25 mg total) by mouth every 8 (eight) hours as needed for nausea or vomiting. 03/21/16   Charlestine Nighthristopher Lawyer, PA-C    Family History No family history on file.  Social History Social History  Substance Use Topics  . Smoking status: Never Smoker  . Smokeless tobacco: Never Used  . Alcohol use No     Allergies   Review of patient's allergies indicates no known allergies.   Review of Systems Review of Systems  Constitutional: Negative.   HENT: Negative.   Respiratory: Negative.   Cardiovascular: Negative.   Gastrointestinal: Negative.   Genitourinary: Positive for vaginal discharge.       Foreign body in vagina  Musculoskeletal: Negative.   Skin: Negative.   Allergic/Immunologic: Positive for immunocompromised state.       Diabetic  Neurological: Negative.   Psychiatric/Behavioral: Negative.   All other systems reviewed  and are negative.    Physical Exam Updated Vital Signs BP (!) 147/102 (BP Location: Left Arm)   Pulse 100   Temp 98.1 F (36.7 C) (Oral)   Resp 18   Ht 5\' 7"  (1.702 m)   Wt 230 lb (104.3 kg)   LMP 08/26/2016   SpO2 100%   BMI 36.02 kg/m   Physical Exam  Constitutional: She appears well-developed and well-nourished. No distress.  HENT:  Head: Normocephalic and atraumatic.  Eyes: Conjunctivae are normal. Pupils are equal, round, and reactive to light.  Neck: Neck supple. No tracheal deviation present. No thyromegaly present.  Cardiovascular: Normal rate and regular rhythm.   No murmur  heard. Pulmonary/Chest: Effort normal and breath sounds normal.  Abdominal: Soft. Bowel sounds are normal. She exhibits no distension. There is no tenderness.  Obese  Genitourinary:  Genitourinary Comments: No external lesion yellowish mucousy discharge in vault mixed with slight amount of blood and also has cheesy-like whitish discharge. No foreign body cervical os closed no cervical motion tenderness no adnexal masses or tenderness.  Musculoskeletal: Normal range of motion. She exhibits no edema or tenderness.  Left lower extremity skin intact. Second toe minimally tender at distal Phalen's. Not red or hot. No obvious paronychia. Good capillary refill. Nail is intact. DP pulse 2+. All other extremities without redness swelling or tenderness neurovascularly intact  Neurological: She is alert. Coordination normal.  Skin: Skin is warm and dry. No rash noted.  Psychiatric: She has a normal mood and affect.  Nursing note and vitals reviewed.    ED Treatments / Results  Labs (all labs ordered are listed, but only abnormal results are displayed) Labs Reviewed - No data to display  EKG  EKG Interpretation None       Radiology No results found.  Procedures Procedures (including critical care time)  Medications Ordered in ED Medications - No data to display  Results for orders placed or performed during the hospital encounter of 08/31/16  Wet prep, genital  Result Value Ref Range   Yeast Wet Prep HPF POC PRESENT (A) NONE SEEN   Trich, Wet Prep NONE SEEN NONE SEEN   Clue Cells Wet Prep HPF POC PRESENT (A) NONE SEEN   WBC, Wet Prep HPF POC MANY (A) NONE SEEN   Sperm NONE SEEN   Basic metabolic panel  Result Value Ref Range   Sodium 130 (L) 135 - 145 mmol/L   Potassium 4.4 3.5 - 5.1 mmol/L   Chloride 99 (L) 101 - 111 mmol/L   CO2 23 22 - 32 mmol/L   Glucose, Bld 504 (HH) 65 - 99 mg/dL   BUN 12 6 - 20 mg/dL   Creatinine, Ser 1.61 0.44 - 1.00 mg/dL   Calcium 9.3 8.9 - 09.6  mg/dL   GFR calc non Af Amer >60 >60 mL/min   GFR calc Af Amer >60 >60 mL/min   Anion gap 8 5 - 15  CBC with Differential/Platelet  Result Value Ref Range   WBC 7.9 4.0 - 10.5 K/uL   RBC 4.86 3.87 - 5.11 MIL/uL   Hemoglobin 14.1 12.0 - 15.0 g/dL   HCT 04.5 40.9 - 81.1 %   MCV 82.9 78.0 - 100.0 fL   MCH 29.0 26.0 - 34.0 pg   MCHC 35.0 30.0 - 36.0 g/dL   RDW 91.4 78.2 - 95.6 %   Platelets 384 150 - 400 K/uL   Neutrophils Relative % 55 %   Neutro Abs 4.4 1.7 - 7.7 K/uL  Lymphocytes Relative 35 %   Lymphs Abs 2.7 0.7 - 4.0 K/uL   Monocytes Relative 6 %   Monocytes Absolute 0.4 0.1 - 1.0 K/uL   Eosinophils Relative 3 %   Eosinophils Absolute 0.3 0.0 - 0.7 K/uL   Basophils Relative 1 %   Basophils Absolute 0.1 0.0 - 0.1 K/uL  Pregnancy, urine  Result Value Ref Range   Preg Test, Ur NEGATIVE NEGATIVE  Urinalysis, Routine w reflex microscopic (not at [facility])  Result Value Ref Range   Color, Urine YELLOW YELLOW   APPearance CLEAR CLEAR   Specific Gravity, Urine 1.036 (H) 1.005 - 1.030   pH 5.5 5.0 - 8.0   Glucose, UA >1000 (A) NEGATIVE mg/dL   Hgb urine dipstick MODERATE (A) NEGATIVE   Bilirubin Urine NEGATIVE NEGATIVE   Ketones, ur NEGATIVE NEGATIVE mg/dL   Protein, ur NEGATIVE NEGATIVE mg/dL   Nitrite NEGATIVE NEGATIVE   Leukocytes, UA NEGATIVE NEGATIVE  Urine microscopic-add on  Result Value Ref Range   Squamous Epithelial / LPF 0-5 (A) NONE SEEN   WBC, UA NONE SEEN 0 - 5 WBC/hpf   RBC / HPF 6-30 0 - 5 RBC/hpf   Bacteria, UA RARE (A) NONE SEEN  CBG monitoring, ED  Result Value Ref Range   Glucose-Capillary 415 (H) 65 - 99 mg/dL  CBG monitoring, ED  Result Value Ref Range   Glucose-Capillary 313 (H) 65 - 99 mg/dL   Comment 1 Call MD NNP PA CNM    Mr Foot Left W Wo Contrast  Result Date: 08/15/2016 CLINICAL DATA:  Gradually worsening pain and discoloration to the left second toe since this morning. Left foot pain and numbness. History of diabetes. EXAM: MRI OF THE LEFT  FOREFOOT WITHOUT AND WITH CONTRAST TECHNIQUE: Multiplanar, multisequence MR imaging was performed both before and after administration of intravenous contrast. CONTRAST:  20mL MULTIHANCE GADOBENATE DIMEGLUMINE 529 MG/ML IV SOLN COMPARISON:  Left foot radiographs 08/14/2016 FINDINGS: Bones and joints appear intact. No abnormal marrow signal intensity, cortical changes, or articular erosions. No focal bone lesion or expansile change. Soft tissues suggest mild edema around the second and third toes. Mild contrast enhancement in the soft tissues around the second and third toes may indicate cellulitis. No discrete abscess. Visualized muscles and tendons appear intact. IMPRESSION: Mild edema and contrast enhancement around the second and third toes may indicate cellulitis. No abscess. Bones appear intact. No evidence of osteomyelitis. Electronically Signed   By: Burman Nieves M.D.   On: 08/15/2016 04:25   Dg Foot Complete Left  Result Date: 08/14/2016 CLINICAL DATA:  Acute onset of left foot pain and discoloration at the left second toe. Initial encounter. EXAM: LEFT FOOT - COMPLETE 3+ VIEW COMPARISON:  None. FINDINGS: There is no evidence of fracture or dislocation. The joint spaces are preserved. There is no evidence of talar subluxation; the subtalar joint is unremarkable in appearance. There is a bipartite lateral sesamoid of the first toe. Known second toe soft tissue abnormalities are not well characterized. No osseous erosions are seen. IMPRESSION: 1. No evidence of fracture or dislocation. No osseous erosions seen, though evaluation for osteomyelitis is limited on radiograph. 2. Bipartite lateral sesamoid of the first toe. Electronically Signed   By: Roanna Raider M.D.   On: 08/14/2016 19:28   Initial Impression / Assessment and Plan / ED Course  I have reviewed the triage vital signs and the nursing notes.  Pertinent labs & imaging results that were available during my care of the  patient were  reviewed by me and considered in my medical decision making (see chart for details).  Clinical Course    8 PM patient resting comfortably after treatment with intravenous fluids and subcutaneous insulin. Case manager was consulted and arranged for outpatient follow-up with her at [facility] next week. Sugar prescriptions for Flagyl, Diflucan, Levemir and NovoLog insulin. There is no retained tampon or foreign body in vagina Final Clinical Impressions(s) / ED Diagnoses  Diagnoses #1 vaginitis #2 hyperglycemia #3 medication noncompliance Final diagnoses:  None    New Prescriptions New Prescriptions   No medications on file     Doug Sou, MD 08/31/16 2006

## 2016-09-01 LAB — RPR: RPR: NONREACTIVE

## 2016-09-01 LAB — HIV ANTIBODY (ROUTINE TESTING W REFLEX): HIV SCREEN 4TH GENERATION: NONREACTIVE

## 2016-09-03 LAB — GC/CHLAMYDIA PROBE AMP (~~LOC~~) NOT AT ARMC
Chlamydia: NEGATIVE
NEISSERIA GONORRHEA: NEGATIVE

## 2016-09-04 ENCOUNTER — Inpatient Hospital Stay: Payer: Medicaid Other | Admitting: Family Medicine

## 2016-10-21 ENCOUNTER — Encounter (HOSPITAL_BASED_OUTPATIENT_CLINIC_OR_DEPARTMENT_OTHER): Payer: Self-pay | Admitting: Adult Health

## 2016-10-21 DIAGNOSIS — B351 Tinea unguium: Secondary | ICD-10-CM | POA: Diagnosis present

## 2016-10-21 DIAGNOSIS — M609 Myositis, unspecified: Secondary | ICD-10-CM | POA: Diagnosis present

## 2016-10-21 DIAGNOSIS — L03031 Cellulitis of right toe: Principal | ICD-10-CM | POA: Diagnosis present

## 2016-10-21 DIAGNOSIS — I4430 Unspecified atrioventricular block: Secondary | ICD-10-CM | POA: Diagnosis not present

## 2016-10-21 DIAGNOSIS — K3184 Gastroparesis: Secondary | ICD-10-CM | POA: Diagnosis present

## 2016-10-21 DIAGNOSIS — Z794 Long term (current) use of insulin: Secondary | ICD-10-CM

## 2016-10-21 DIAGNOSIS — Z79899 Other long term (current) drug therapy: Secondary | ICD-10-CM

## 2016-10-21 DIAGNOSIS — D649 Anemia, unspecified: Secondary | ICD-10-CM | POA: Diagnosis present

## 2016-10-21 DIAGNOSIS — E1142 Type 2 diabetes mellitus with diabetic polyneuropathy: Secondary | ICD-10-CM | POA: Diagnosis present

## 2016-10-21 DIAGNOSIS — L03115 Cellulitis of right lower limb: Secondary | ICD-10-CM | POA: Diagnosis present

## 2016-10-21 DIAGNOSIS — E1165 Type 2 diabetes mellitus with hyperglycemia: Secondary | ICD-10-CM | POA: Diagnosis present

## 2016-10-21 DIAGNOSIS — J45909 Unspecified asthma, uncomplicated: Secondary | ICD-10-CM | POA: Diagnosis present

## 2016-10-21 DIAGNOSIS — K589 Irritable bowel syndrome without diarrhea: Secondary | ICD-10-CM | POA: Diagnosis present

## 2016-10-21 DIAGNOSIS — E1143 Type 2 diabetes mellitus with diabetic autonomic (poly)neuropathy: Secondary | ICD-10-CM | POA: Diagnosis present

## 2016-10-21 MED ORDER — OXYCODONE-ACETAMINOPHEN 5-325 MG PO TABS
1.0000 | ORAL_TABLET | ORAL | Status: AC | PRN
  Administered 2016-10-21 – 2016-10-22 (×2): 1 via ORAL
  Filled 2016-10-21 (×2): qty 1

## 2016-10-21 NOTE — ED Triage Notes (Signed)
PResents with worsening cellulitis of right foot. Began one week ago and [facility], finished one antibiotic-bactrim and another one , the infection is worsening and more painful. Right foot red, warm and edematous.

## 2016-10-22 ENCOUNTER — Encounter (HOSPITAL_COMMUNITY): Payer: Self-pay | Admitting: Physician Assistant

## 2016-10-22 ENCOUNTER — Inpatient Hospital Stay (HOSPITAL_COMMUNITY): Payer: Medicaid Other

## 2016-10-22 ENCOUNTER — Inpatient Hospital Stay (HOSPITAL_BASED_OUTPATIENT_CLINIC_OR_DEPARTMENT_OTHER)
Admission: EM | Admit: 2016-10-22 | Discharge: 2016-10-25 | DRG: 603 | Disposition: A | Discharge status: Home / Self Care | Payer: Medicaid Other | Attending: Family Medicine | Admitting: Family Medicine

## 2016-10-22 DIAGNOSIS — E118 Type 2 diabetes mellitus with unspecified complications: Secondary | ICD-10-CM | POA: Diagnosis not present

## 2016-10-22 DIAGNOSIS — K589 Irritable bowel syndrome without diarrhea: Secondary | ICD-10-CM | POA: Diagnosis not present

## 2016-10-22 DIAGNOSIS — E1142 Type 2 diabetes mellitus with diabetic polyneuropathy: Secondary | ICD-10-CM | POA: Diagnosis present

## 2016-10-22 DIAGNOSIS — L02611 Cutaneous abscess of right foot: Secondary | ICD-10-CM | POA: Diagnosis present

## 2016-10-22 DIAGNOSIS — J45909 Unspecified asthma, uncomplicated: Secondary | ICD-10-CM | POA: Diagnosis present

## 2016-10-22 DIAGNOSIS — Z794 Long term (current) use of insulin: Secondary | ICD-10-CM | POA: Diagnosis not present

## 2016-10-22 DIAGNOSIS — M869 Osteomyelitis, unspecified: Secondary | ICD-10-CM

## 2016-10-22 DIAGNOSIS — D649 Anemia, unspecified: Secondary | ICD-10-CM | POA: Diagnosis present

## 2016-10-22 DIAGNOSIS — E1165 Type 2 diabetes mellitus with hyperglycemia: Secondary | ICD-10-CM

## 2016-10-22 DIAGNOSIS — I4430 Unspecified atrioventricular block: Secondary | ICD-10-CM | POA: Diagnosis not present

## 2016-10-22 DIAGNOSIS — L03031 Cellulitis of right toe: Principal | ICD-10-CM | POA: Diagnosis present

## 2016-10-22 DIAGNOSIS — L03115 Cellulitis of right lower limb: Secondary | ICD-10-CM | POA: Diagnosis present

## 2016-10-22 DIAGNOSIS — Z789 Other specified health status: Secondary | ICD-10-CM | POA: Diagnosis not present

## 2016-10-22 DIAGNOSIS — B351 Tinea unguium: Secondary | ICD-10-CM | POA: Diagnosis present

## 2016-10-22 DIAGNOSIS — M609 Myositis, unspecified: Secondary | ICD-10-CM | POA: Diagnosis present

## 2016-10-22 DIAGNOSIS — K3184 Gastroparesis: Secondary | ICD-10-CM | POA: Diagnosis present

## 2016-10-22 DIAGNOSIS — M79609 Pain in unspecified limb: Secondary | ICD-10-CM

## 2016-10-22 DIAGNOSIS — L03116 Cellulitis of left lower limb: Secondary | ICD-10-CM | POA: Insufficient documentation

## 2016-10-22 DIAGNOSIS — Z79899 Other long term (current) drug therapy: Secondary | ICD-10-CM | POA: Diagnosis not present

## 2016-10-22 DIAGNOSIS — R739 Hyperglycemia, unspecified: Secondary | ICD-10-CM

## 2016-10-22 DIAGNOSIS — L0390 Cellulitis, unspecified: Secondary | ICD-10-CM

## 2016-10-22 DIAGNOSIS — E1169 Type 2 diabetes mellitus with other specified complication: Secondary | ICD-10-CM

## 2016-10-22 DIAGNOSIS — IMO0002 Reserved for concepts with insufficient information to code with codable children: Secondary | ICD-10-CM

## 2016-10-22 DIAGNOSIS — K582 Mixed irritable bowel syndrome: Secondary | ICD-10-CM

## 2016-10-22 DIAGNOSIS — J4520 Mild intermittent asthma, uncomplicated: Secondary | ICD-10-CM | POA: Diagnosis not present

## 2016-10-22 DIAGNOSIS — M79604 Pain in right leg: Secondary | ICD-10-CM | POA: Diagnosis present

## 2016-10-22 DIAGNOSIS — E1143 Type 2 diabetes mellitus with diabetic autonomic (poly)neuropathy: Secondary | ICD-10-CM | POA: Diagnosis present

## 2016-10-22 HISTORY — DX: Irritable bowel syndrome, unspecified: K58.9

## 2016-10-22 HISTORY — DX: Cellulitis of left lower limb: L03.116

## 2016-10-22 LAB — CBC WITH DIFFERENTIAL/PLATELET
BASOS PCT: 1 %
Basophils Absolute: 0.1 10*3/uL (ref 0.0–0.1)
EOS ABS: 0.2 10*3/uL (ref 0.0–0.7)
EOS PCT: 2 %
HEMATOCRIT: 41.7 % (ref 36.0–46.0)
HEMOGLOBIN: 14.6 g/dL (ref 12.0–15.0)
LYMPHS PCT: 27 %
Lymphs Abs: 3 10*3/uL (ref 0.7–4.0)
MCH: 28.9 pg (ref 26.0–34.0)
MCHC: 35 g/dL (ref 30.0–36.0)
MCV: 82.6 fL (ref 78.0–100.0)
MONOS PCT: 6 %
Monocytes Absolute: 0.7 10*3/uL (ref 0.1–1.0)
NEUTROS ABS: 7 10*3/uL (ref 1.7–7.7)
NEUTROS PCT: 64 %
Platelets: 484 10*3/uL — ABNORMAL HIGH (ref 150–400)
RBC: 5.05 MIL/uL (ref 3.87–5.11)
RDW: 13.3 % (ref 11.5–15.5)
WBC: 11 10*3/uL — ABNORMAL HIGH (ref 4.0–10.5)

## 2016-10-22 LAB — URINALYSIS, ROUTINE W REFLEX MICROSCOPIC
Bilirubin Urine: NEGATIVE
KETONES UR: NEGATIVE mg/dL
LEUKOCYTES UA: NEGATIVE
Nitrite: NEGATIVE
PH: 5.5 (ref 5.0–8.0)
PROTEIN: NEGATIVE mg/dL
Specific Gravity, Urine: 1.042 — ABNORMAL HIGH (ref 1.005–1.030)

## 2016-10-22 LAB — COMPREHENSIVE METABOLIC PANEL
ALBUMIN: 3.7 g/dL (ref 3.5–5.0)
ALK PHOS: 81 U/L (ref 38–126)
ALT: 22 U/L (ref 14–54)
ANION GAP: 11 (ref 5–15)
AST: 21 U/L (ref 15–41)
BUN: 15 mg/dL (ref 6–20)
CALCIUM: 9.8 mg/dL (ref 8.9–10.3)
CHLORIDE: 94 mmol/L — AB (ref 101–111)
CO2: 24 mmol/L (ref 22–32)
Creatinine, Ser: 0.67 mg/dL (ref 0.44–1.00)
GFR calc non Af Amer: >60 mL/min (ref >60)
GLUCOSE: 537 mg/dL — AB (ref 65–99)
Potassium: 4.3 mmol/L (ref 3.5–5.1)
SODIUM: 129 mmol/L — AB (ref 135–145)
Total Bilirubin: 0.5 mg/dL (ref 0.3–1.2)
Total Protein: 8.6 g/dL — ABNORMAL HIGH (ref 6.5–8.1)

## 2016-10-22 LAB — URINE MICROSCOPIC-ADD ON: BACTERIA UA: NONE SEEN

## 2016-10-22 LAB — SEDIMENTATION RATE: Sed Rate: 70 mm/hr — ABNORMAL HIGH (ref 0–22)

## 2016-10-22 LAB — GLUCOSE, CAPILLARY
GLUCOSE-CAPILLARY: 388 mg/dL — AB (ref 65–99)
Glucose-Capillary: 368 mg/dL — ABNORMAL HIGH (ref 65–99)
Glucose-Capillary: 291 mg/dL — ABNORMAL HIGH (ref 65–99)
Glucose-Capillary: 362 mg/dL — ABNORMAL HIGH (ref 65–99)

## 2016-10-22 LAB — I-STAT CG4 LACTIC ACID, ED: Lactic Acid, Venous: 2.24 mmol/L (ref 0.5–1.9)

## 2016-10-22 LAB — CBG MONITORING, ED: GLUCOSE-CAPILLARY: 332 mg/dL — AB (ref 65–99)

## 2016-10-22 LAB — PREGNANCY, URINE: Preg Test, Ur: NEGATIVE

## 2016-10-22 LAB — LACTIC ACID, PLASMA
LACTIC ACID, VENOUS: 1 mmol/L (ref 0.5–1.9)
Lactic Acid, Venous: 0.9 mmol/L (ref 0.5–1.9)

## 2016-10-22 MED ORDER — ENOXAPARIN SODIUM 40 MG/0.4ML ~~LOC~~ SOLN: 40.0000 mg | SUBCUTANEOUS | Status: DC

## 2016-10-22 MED ORDER — SODIUM CHLORIDE 0.9 % IV BOLUS (SEPSIS)
1000.0000 mL | Freq: Once | INTRAVENOUS | Status: AC
  Administered 2016-10-22: 1000 mL via INTRAVENOUS

## 2016-10-22 MED ORDER — ONDANSETRON HCL 4 MG/2ML IJ SOLN
4.0000 mg | Freq: Four times a day (QID) | INTRAMUSCULAR | Status: DC | PRN
  Administered 2016-10-22 – 2016-10-24 (×3): 4 mg via INTRAVENOUS
  Filled 2016-10-22 (×3): qty 2

## 2016-10-22 MED ORDER — HYDROCODONE-ACETAMINOPHEN 5-325 MG PO TABS
1.0000 | ORAL_TABLET | ORAL | Status: DC | PRN
  Administered 2016-10-23: 2 via ORAL
  Filled 2016-10-22 (×2): qty 2

## 2016-10-22 MED ORDER — ACETAMINOPHEN 650 MG RE SUPP: 650.0000 mg | Freq: Four times a day (QID) | RECTAL | Status: DC | PRN

## 2016-10-22 MED ORDER — INSULIN ASPART 100 UNIT/ML ~~LOC~~ SOLN
0.0000 [IU] | Freq: Three times a day (TID) | SUBCUTANEOUS | Status: DC
  Administered 2016-10-22: 9 [IU] via SUBCUTANEOUS

## 2016-10-22 MED ORDER — ONDANSETRON HCL 4 MG PO TABS: 4.0000 mg | ORAL_TABLET | Freq: Four times a day (QID) | ORAL | Status: DC | PRN

## 2016-10-22 MED ORDER — TRAZODONE HCL 50 MG PO TABS
25.0000 mg | ORAL_TABLET | Freq: Every evening | ORAL | Status: DC | PRN
  Administered 2016-10-22: 50 mg via ORAL
  Administered 2016-10-23: 25 mg via ORAL
  Filled 2016-10-22 (×2): qty 1

## 2016-10-22 MED ORDER — VANCOMYCIN HCL IN DEXTROSE 1-5 GM/200ML-% IV SOLN
1000.0000 mg | Freq: Once | INTRAVENOUS | Status: AC
  Administered 2016-10-22: 1000 mg via INTRAVENOUS
  Filled 2016-10-22: qty 200

## 2016-10-22 MED ORDER — PIPERACILLIN-TAZOBACTAM 3.375 G IVPB
3.3750 g | Freq: Three times a day (TID) | INTRAVENOUS | Status: DC
  Administered 2016-10-22 – 2016-10-25 (×8): 3.375 g via INTRAVENOUS
  Filled 2016-10-22 (×11): qty 50

## 2016-10-22 MED ORDER — INSULIN ASPART 100 UNIT/ML ~~LOC~~ SOLN
0.0000 [IU] | Freq: Every day | SUBCUTANEOUS | Status: DC
  Administered 2016-10-22: 5 [IU] via SUBCUTANEOUS

## 2016-10-22 MED ORDER — ACETAMINOPHEN 325 MG PO TABS: 650.0000 mg | ORAL_TABLET | Freq: Four times a day (QID) | ORAL | Status: DC | PRN

## 2016-10-22 MED ORDER — ENOXAPARIN SODIUM 60 MG/0.6ML ~~LOC~~ SOLN
50.0000 mg | SUBCUTANEOUS | Status: DC
  Administered 2016-10-22 – 2016-10-25 (×4): 50 mg via SUBCUTANEOUS
  Filled 2016-10-22 (×4): qty 0.6

## 2016-10-22 MED ORDER — INSULIN ASPART 100 UNIT/ML ~~LOC~~ SOLN: 0.0000 [IU] | Freq: Three times a day (TID) | SUBCUTANEOUS | Status: DC

## 2016-10-22 MED ORDER — VANCOMYCIN HCL IN DEXTROSE 1-5 GM/200ML-% IV SOLN
1000.0000 mg | Freq: Three times a day (TID) | INTRAVENOUS | Status: DC
  Administered 2016-10-22 – 2016-10-25 (×9): 1000 mg via INTRAVENOUS
  Filled 2016-10-22 (×11): qty 200

## 2016-10-22 MED ORDER — MORPHINE SULFATE (PF) 4 MG/ML IV SOLN
4.0000 mg | Freq: Once | INTRAVENOUS | Status: AC
Start: 2016-10-22 — End: 2016-10-22
  Administered 2016-10-22: 4 mg via INTRAVENOUS
  Filled 2016-10-22: qty 1

## 2016-10-22 MED ORDER — INSULIN DETEMIR 100 UNIT/ML ~~LOC~~ SOLN: 50.0000 [IU] | Freq: Two times a day (BID) | SUBCUTANEOUS | Status: DC

## 2016-10-22 MED ORDER — ONDANSETRON HCL 4 MG/2ML IJ SOLN
4.0000 mg | Freq: Once | INTRAMUSCULAR | Status: AC
  Administered 2016-10-22: 4 mg via INTRAVENOUS
  Filled 2016-10-22: qty 2

## 2016-10-22 MED ORDER — SENNOSIDES-DOCUSATE SODIUM 8.6-50 MG PO TABS
1.0000 | ORAL_TABLET | Freq: Every evening | ORAL | Status: DC | PRN
  Filled 2016-10-22: qty 1

## 2016-10-22 MED ORDER — INSULIN ASPART 100 UNIT/ML ~~LOC~~ SOLN
0.0000 [IU] | Freq: Three times a day (TID) | SUBCUTANEOUS | Status: DC
  Administered 2016-10-22: 8 [IU] via SUBCUTANEOUS
  Administered 2016-10-23 (×2): 11 [IU] via SUBCUTANEOUS
  Administered 2016-10-23: 8 [IU] via SUBCUTANEOUS
  Administered 2016-10-24: 5 [IU] via SUBCUTANEOUS

## 2016-10-22 MED ORDER — BISACODYL 10 MG RE SUPP: 10.0000 mg | Freq: Every day | RECTAL | Status: DC | PRN

## 2016-10-22 MED ORDER — MAGNESIUM CITRATE PO SOLN: 1.0000 | Freq: Once | ORAL | Status: DC | PRN

## 2016-10-22 MED ORDER — PIPERACILLIN-TAZOBACTAM 3.375 G IVPB 30 MIN
3.3750 g | Freq: Once | INTRAVENOUS | Status: AC
Start: 2016-10-22 — End: 2016-10-22
  Administered 2016-10-22: 3.375 g via INTRAVENOUS
  Filled 2016-10-22 (×2): qty 50

## 2016-10-22 MED ORDER — INSULIN ASPART 100 UNIT/ML IV SOLN
10.0000 [IU] | Freq: Once | INTRAVENOUS | Status: AC
  Administered 2016-10-22: 10 [IU] via INTRAVENOUS
  Filled 2016-10-22: qty 1

## 2016-10-22 MED ORDER — INSULIN DETEMIR 100 UNIT/ML ~~LOC~~ SOLN
40.0000 [IU] | Freq: Two times a day (BID) | SUBCUTANEOUS | Status: DC
  Administered 2016-10-22 – 2016-10-23 (×2): 40 [IU] via SUBCUTANEOUS
  Filled 2016-10-22 (×3): qty 0.4

## 2016-10-22 MED ORDER — SODIUM CHLORIDE 0.9 % IV SOLN
INTRAVENOUS | Status: DC
  Administered 2016-10-22 – 2016-10-23 (×3): via INTRAVENOUS

## 2016-10-22 MED ORDER — SODIUM CHLORIDE 0.9% FLUSH
3.0000 mL | Freq: Two times a day (BID) | INTRAVENOUS | Status: DC
  Administered 2016-10-22 – 2016-10-24 (×5): 3 mL via INTRAVENOUS

## 2016-10-22 NOTE — Progress Notes (Signed)
Inpatient Diabetes Program Recommendations  AACE/ADA: New Consensus Statement on Inpatient Glycemic Control (2015)  Target Ranges:  Prepandial:   less than 140 mg/dL      Peak postprandial:   less than 180 mg/dL (1-2 hours)      Critically ill patients:  140 - 180 mg/dL   Lab Results  Component Value Date   GLUCAP 368 (H) 10/22/2016   HGBA1C (H) 11/28/2007    10.9 (NOTE)   The ADA recommends the following therapeutic goals for glycemic   control related to Hgb A1C measurement:   Goal of Therapy:   < 7.0% Hgb A1C   Action Suggested:  > 8.0% Hgb A1C   Ref:  Diabetes Care, 22, Suppl. 1, 1999    HGB A1C = 11.3 (A) on 03/30/16 [facility]  Review of Glycemic Control  Diabetes history: DM2, Obesity, BUN & Creatinine WNL Outpatient Diabetes medications: Novolog 35-45 units QID, Levemir 100 units, Metformin 500 mg BID Current orders for Inpatient glycemic control: Novolog 0-9 units TIDAC, Carb Mod Diet  Inpatient Diabetes Program Recommendations - Please consider:      Basal coverage of Levemir 30 units (100 Kg x 0.3 units/Kg) BID due to lower po intake.  Thank you,  Kristine LineaKaren Tywone Bembenek, RN, BSN Diabetes Coordinator Inpatient Diabetes Program [phone] (Team Pager)

## 2016-10-22 NOTE — Progress Notes (Signed)
*  Preliminary Results* Right lower extremity venous duplex completed. Right lower extremity is negative for deep vein thrombosis. There is no evidence of right Baker's cyst.  Incidental finding: there is a heterogenous area of the right groin measuring 2.9cm, suggestive of a possible enlarged inguinal lymph node.  10/22/2016 9:40 AM  Gertie FeyMichelle Emmilynn Marut, BS, RVT, RDCS, RDMS

## 2016-10-22 NOTE — H&P (Signed)
History and Physical    Lindsay Love UKG:[id number] DOB: 28-Feb-1981 DOA: 10/22/2016   PCP: No PCP Per Patient   Patient coming from:  Home   Chief Complaint: Right foot pain   HPI: Lindsay Love is a 35 y.o. female  with recent diagnosis of right foot cellulitis, treated  with Bactrim at [place] as outpatient, failing its management, 17 to med Center at [facility] with worsening symptoms. She noted increased redness and swelling of the right food, with pain worse with movement. She denies any insect bites, or trauma to the area. She also denies any other recent infections. The patient reports the pain now to be extending to the right groin and leg area. She denies any leg swelling. No recent long distance trips. No changes in her medications. She is fairly active. She was taking ibuprofen, which was not giving relief. She also reported subjective fevers as well as chills and sweats.  Denies any respiratory complaints. Denies any chest pain or palpitations. Denies lower extremity swelling. Denies nausea, heartburn or change in bowel habits.  Denies abdominal pain. Appetite is normal. Denies any dysuria. Denies abnormal skin rashes, Denies any bleeding issues such as epistaxis, hematemesis, hematuria or hematochezia.     ED Course:  BP 122/80 (BP Location: Left Arm)   Pulse (!) 103   Temp 98.4 F (36.9 C) (Oral)   Resp 19   Ht 5' 7" (1.702 m)   Wt 104.6 kg (230 lb 9.6 oz)   LMP 09/30/2016   SpO2 100%   BMI 36.12 kg/m    white count 11 platelets 484 hemoglobin 14.6 lactic acid 2.24 Sed rate 70   Review of Systems: As per HPI otherwise 10 point review of systems negative.   Past Medical History:  Diagnosis Date  . Asthma   . Cellulitis of left foot   . Diabetes mellitus without complication ([place])   . IBS (irritable bowel syndrome)     Past Surgical History:  Procedure Laterality Date  . CESAREAN SECTION    . CHOLECYSTECTOMY    . CYST EXCISION    . TONSILLECTOMY    . TOOTH  EXTRACTION      Social History Social History   Social History  . Marital status: Single    Spouse name: N/A  . Number of children: N/A  . Years of education: N/A   Occupational History  . unemployed    Social History Main Topics  . Smoking status: Never Smoker  . Smokeless tobacco: Never Used  . Alcohol use No  . Drug use: No  . Sexual activity: Yes    Birth control/ protection: None   Other Topics Concern  . Not on file   Social History Narrative  . No narrative on file     No Known Allergies  Family History  Problem Relation Age of Onset  . Diabetes Mother   . Diabetes Father       Prior to Admission medications   Medication Sig Start Date End Date Taking? Authorizing Provider  clindamycin (CLEOCIN) 150 MG capsule Take 1 capsule (150 mg total) by mouth every 6 (six) hours. 08/15/16   Tiffany Carlota Raspberry, PA-C  HYDROcodone-acetaminophen (NORCO) 5-325 MG tablet Take 1-2 tablets by mouth every 6 (six) hours as needed (for eye pain). 12/28/15   John Molpus, MD  insulin aspart (NOVOLOG) 100 UNIT/ML injection Inject 35 Units into the skin 2 (two) times daily before a meal. 08/31/16   Orlie Dakin, MD  insulin detemir (LEVEMIR)  100 UNIT/ML injection Inject 1 mL (100 Units total) into the skin at bedtime. 08/31/16   Orlie Dakin, MD  metFORMIN (GLUCOPHAGE) 500 MG tablet Take 500 mg by mouth 2 (two) times daily with a meal.    Historical Provider, MD  metroNIDAZOLE (FLAGYL) 500 MG tablet Take 1 tablet (500 mg total) by mouth 2 (two) times daily. 08/31/16   Orlie Dakin, MD  naphazoline-pheniramine (NAPHCON-A) 0.025-0.3 % ophthalmic solution Place 1 drop into the left eye every 4 (four) hours as needed for irritation. 03/24/16   Comer Locket, PA-C  ondansetron (ZOFRAN) 4 MG tablet Take 1 tablet (4 mg total) by mouth every 6 (six) hours as needed for nausea or vomiting. 09/11/15   Forde Dandy, MD  promethazine (PHENERGAN) 25 MG tablet Take 1 tablet (25 mg total) by mouth  every 8 (eight) hours as needed for nausea or vomiting. 03/21/16   Dalia Heading, PA-C    Physical Exam:    Vitals:   10/22/16 0330 10/22/16 0345 10/22/16 0400 10/22/16 0524  BP: 128/72 122/78 131/83 122/80  Pulse: 103 105 106 (!) 103  Resp: _0 Temp:    98.4 F (36.9 C)  TempSrc:    Oral  SpO2: 100% 98% 99% 100%  Weight:    104.6 kg (230 lb 9.6 oz)  Height:    5' 7" (1.702 m)       Constitutional: NAD, calm, comfortable  Vitals:   10/22/16 0330 10/22/16 0345 10/22/16 0400 10/22/16 0524  BP: 128/72 122/78 131/83 122/80  Pulse: 103 105 106 (!) 103  Resp: _1 Temp:    98.4 F (36.9 C)  TempSrc:    Oral  SpO2: 100% 98% 99% 100%  Weight:    104.6 kg (230 lb 9.6 oz)  Height:    5' 7" (1.702 m)   Eyes: PERRL, lids and conjunctivae normal ENMT: Mucous membranes are moist. Posterior pharynx clear of any exudate or lesions.Normal dentition.  Neck: normal, supple, no masses, no thyromegaly Respiratory: clear to auscultation bilaterally, no wheezing, no crackles. Normal respiratory effort. No accessory muscle use.  Cardiovascular: Regular rate and rhythm, no murmurs / rubs / gallops. No extremity edema. RLE very tender to the touch, warm, no cords. 2+ pedal pulses. No carotid bruits.  Abdomen: no tenderness, no masses palpated. No hepatosplenomegaly. Bowel sounds positive.  Musculoskeletal: no clubbing / cyanosis. No joint deformity upper and lower extremities. Good ROM, no contractures. Normal muscle tone.  Skin: there is anything man swelling on the right 1st tell, 2nd toe, with mild edema in the area, that is some tenderness to palpation in the right calf and thigh, without swelling. No apparent open lesions or erythema in the area . There is no difference of circumference in both lower extremities.  Neurologic: CN 2-12 grossly intact. Sensation intact, DTR normal. Strength 5/5 in all 4.  Psychiatric: Normal judgment and insight. Alert and oriented x 3.  Normal mood.     Labs on Admission: I have personally reviewed following labs and imaging studies  CBC:  Recent Labs Lab 10/22/16 0139  WBC 11.0*  NEUTROABS 7.0  HGB 14.6  HCT 41.7  MCV 82.6  PLT 484*    Basic Metabolic Panel:  Recent Labs Lab 10/22/16 0139  NA 129*  K 4.3  CL 94*  CO2 24  GLUCOSE 537*  BUN 15  CREATININE 0.67  CALCIUM 9.8    GFR: Estimated Creatinine Clearance: 122.1 mL/min (by C-G formula  based on SCr of 0.67 mg/dL).  Liver Function Tests:  Recent Labs Lab 10/22/16 0139  AST 21  ALT 22  ALKPHOS 81  BILITOT 0.5  PROT 8.6*  ALBUMIN 3.7   No results for input(s): LIPASE, AMYLASE in the last 168 hours. No results for input(s): AMMONIA in the last 168 hours.  Coagulation Profile: No results for input(s): INR, PROTIME in the last 168 hours.  Cardiac Enzymes: No results for input(s): CKTOTAL, CKMB, CKMBINDEX, TROPONINI in the last 168 hours.  BNP (last 3 results) No results for input(s): PROBNP in the last 8760 hours.  HbA1C: No results for input(s): HGBA1C in the last 72 hours.  CBG:  Recent Labs Lab 10/22/16 0400 10/22/16 0749  GLUCAP 332* 388*    Lipid Profile: No results for input(s): CHOL, HDL, LDLCALC, TRIG, CHOLHDL, LDLDIRECT in the last 72 hours.  Thyroid Function Tests: No results for input(s): TSH, T4TOTAL, FREET4, T3FREE, THYROIDAB in the last 72 hours.  Anemia Panel: No results for input(s): VITAMINB12, FOLATE, FERRITIN, TIBC, IRON, RETICCTPCT in the last 72 hours.  Urine analysis:    Component Value Date/Time   COLORURINE YELLOW 10/22/2016 0113   APPEARANCEUR CLEAR 10/22/2016 0113   LABSPEC 1.042 (H) 10/22/2016 0113   PHURINE 5.5 10/22/2016 0113   GLUCOSEU >1000 (A) 10/22/2016 0113   HGBUR MODERATE (A) 10/22/2016 0113   BILIRUBINUR NEGATIVE 10/22/2016 0113   KETONESUR NEGATIVE 10/22/2016 0113   PROTEINUR NEGATIVE 10/22/2016 0113   UROBILINOGEN 0.2 07/11/2014 1555   NITRITE NEGATIVE 10/22/2016 0113     LEUKOCYTESUR NEGATIVE 10/22/2016 0113    Sepsis Labs: _0 (procalcitonin:4,lacticidven:4) ) Recent Results (from the past 240 hour(s))  Blood Culture (routine x 2)     Status: None (Preliminary result)   Collection Time: 10/22/16  1:40 AM  Result Value Ref Range Status   Specimen Description BLOOD LEFT FOREARM  Final   Special Requests BOTTLES DRAWN AEROBIC AND ANAEROBIC 5CC  Final   Culture PENDING  Incomplete   Report Status PENDING  Incomplete  Blood Culture (routine x 2)     Status: None (Preliminary result)   Collection Time: 10/22/16  1:40 AM  Result Value Ref Range Status   Specimen Description BLOOD RIGHT FOREARM  Final   Special Requests BOTTLES DRAWN AEROBIC AND ANAEROBIC 5CC  Final   Culture PENDING  Incomplete   Report Status PENDING  Incomplete     Radiological Exams on Admission: No results found.  EKG: Independently reviewed.  Assessment/Plan Active Problems:   Cellulitis of right foot   Diabetes (HCC)   IBS (irritable bowel syndrome)   Asthma   Right leg pain   Cellulitis of the right foot failing OP management with Augmentin and Azithromycin. Pain now extends to the right leg  WBC 11. ESR 70  Afebrile Initial lactic acid 2.24  VSS, nontoxic appearing. Received IV Zosyn and Vanco, 3 l IVF  -Admit to inpatient  telemetry Cellulitis order set X ray R foot  -Continue broad spectrum antibiotics with vancomycin and Zosyn Blood cultures  Check venous doppler of the RLE  If cellulitis not improving by tomorrow, will obtain hand surgery and ID consult   Type II Diabetes Current blood sugar level is 537 without evidence of ketoacidosis, now 388 after receiving 1 dose of IV insulin 10 U  Lab Results  Component Value Date   HGBA1C (H) 11/28/2007    10.9 (NOTE)   The ADA recommends the following therapeutic goals for glycemic   control related to Hgb  A1C measurement:   Goal of Therapy:   < 7.0% Hgb A1C   Action Suggested:  > 8.0% Hgb A1C   Ref:  Diabetes  Care, 22, Suppl. 1, 1999   Hgb A1C Hold home oral diabetic medications.  SSI Heart healthy carb modified diet.  IBS without flare  No intervention is indicated at this time   Asthma without exacerbation, Sat in the 90s in RA No intervention is indicated at this time    DVT prophylaxis: Lovenox   Code Status:   Full    Family Communication:  Discussed with patient Disposition Plan: Expect patient to be discharged to home after condition improves Consults called:    None Admission status:Tele  Inpatient     [facility] E, PA-C [facility]   10/22/2016, 8:40 AM

## 2016-10-22 NOTE — Progress Notes (Signed)
Pharmacy Antibiotic Note  Lindsay CamaraBrandena Love is a 35 y.o. female admitted on 10/22/2016 with cellulitis.  Pharmacy has been consulted for Vancomycin/Zosyn dosing. She is a diabetic. WBC mildly elevated. Renal function good.   Plan: -Vancomycin 1000 mg IV q8h -Zosyn 3.375G IV q8h to be infused over 4 hours -Trend WBC, temp, renal function  -Drug levels at steady state  Height: 5\' 7"  (170.2 cm) Weight: 220 lb (99.8 kg) IBW/kg (Calculated) : 61.6  Temp (24hrs), Avg:98 F (36.7 C), Min:97.9 F (36.6 C), Max:98.2 F (36.8 C)   Recent Labs Lab 10/22/16 0139 10/22/16 0154  WBC 11.0*  --   CREATININE 0.67  --   LATICACIDVEN  --  2.24*    Estimated Creatinine Clearance: 119.2 mL/min (by C-G formula based on SCr of 0.67 mg/dL).    No Known Allergies   Lindsay Love, Lindsay Love 10/22/2016 5:23 AM

## 2016-10-22 NOTE — ED Provider Notes (Signed)
[facility] Provider Note   CSN: [id number] Arrival date & time: 10/21/16  2256     History   Chief Complaint Chief Complaint  Patient presents with  . Cellulitis    HPI Lindsay Love is a 35 y.o. female.  She is complaining of pain in her right foot radiating up her left leg to the groin. She was seen at [facility] but week ago and started on Bactrim and Keflex for cellulitis, but states symptoms are getting worse. She's noted increased redness and swelling of the foot. Pain in leg is worse with movement. She rates pain at 6/10. She's been taking ibuprofen which is not giving her relief. She has had subjective fever as well as chills and sweats. She is concerned because symptoms seem to be getting worse in spite of taking the antibiotics. She is diabetic and states her blood sugars have been running in the high 300s.   The history is provided by the patient.    Past Medical History:  Diagnosis Date  . Asthma   . Diabetes mellitus without complication (HCC)     There are no active problems to display for this patient.   Past Surgical History:  Procedure Laterality Date  . CESAREAN SECTION    . CHOLECYSTECTOMY    . CYST EXCISION    . TONSILLECTOMY    . TOOTH EXTRACTION      OB History    No data available       Home Medications    Prior to Admission medications   Medication Sig Start Date End Date Taking? Authorizing Provider  clindamycin (CLEOCIN) 150 MG capsule Take 1 capsule (150 mg total) by mouth every 6 (six) hours. 08/15/16   Tiffany Neva Seat, PA-C  HYDROcodone-acetaminophen (NORCO) 5-325 MG tablet Take 1-2 tablets by mouth every 6 (six) hours as needed (for eye pain). 12/28/15   John Molpus, MD  insulin aspart (NOVOLOG) 100 UNIT/ML injection Inject 35 Units into the skin 2 (two) times daily before a meal. 08/31/16   Doug Sou, MD  insulin detemir (LEVEMIR) 100 UNIT/ML injection Inject 1 mL (100 Units total) into the  skin at bedtime. 08/31/16   Doug Sou, MD  metFORMIN (GLUCOPHAGE) 500 MG tablet Take 500 mg by mouth 2 (two) times daily with a meal.    Historical Provider, MD  metroNIDAZOLE (FLAGYL) 500 MG tablet Take 1 tablet (500 mg total) by mouth 2 (two) times daily. 08/31/16   Doug Sou, MD  naphazoline-pheniramine (NAPHCON-A) 0.025-0.3 % ophthalmic solution Place 1 drop into the left eye every 4 (four) hours as needed for irritation. 03/24/16   Joycie Peek, PA-C  ondansetron (ZOFRAN) 4 MG tablet Take 1 tablet (4 mg total) by mouth every 6 (six) hours as needed for nausea or vomiting. 09/11/15   Lavera Guise, MD  promethazine (PHENERGAN) 25 MG tablet Take 1 tablet (25 mg total) by mouth every 8 (eight) hours as needed for nausea or vomiting. 03/21/16   Charlestine Night, PA-C    Family History History reviewed. No pertinent family history.  Social History Social History  Substance Use Topics  . Smoking status: Never Smoker  . Smokeless tobacco: Never Used  . Alcohol use No     Allergies   Patient has no known allergies.   Review of Systems Review of Systems  All other systems reviewed and are negative.    Physical Exam Updated Vital Signs BP 133/96 (BP Location: Right Arm)   Pulse 118  Temp 97.9 F (36.6 C) (Oral)   Resp 18   Ht 5\' 7"  (1.702 m)   Wt 220 lb (99.8 kg)   LMP 09/30/2016   SpO2 100%   BMI 34.46 kg/m   Physical Exam  Nursing note and vitals reviewed.  35 year old female, resting comfortably and in no acute distress. Vital signs are Significant for tachycardia. Oxygen saturation is 100%, which is normal. Head is normocephalic and atraumatic. PERRLA, EOMI. Oropharynx is clear. Neck is nontender and supple without adenopathy or JVD. Back is nontender and there is no CVA tenderness. Lungs are clear without rales, wheezes, or rhonchi. Chest is nontender. Heart has regular rate and rhythm without murmur. Abdomen is soft, flat, nontender without masses or  hepatosplenomegaly and peristalsis is normoactive. Extremities: There is erythema and swelling of the right first toe and second toe. 2-3+ pedal edema is present on the right foot. There is a callus on the plantar surface of the right foot with some fluctuance. Tinea pedis is present in the intertriginous space between the first and second toes. There is tenderness palpation of the right calf and thigh but no swelling. Calf and thigh circumference is symmetric. Skin is warm and dry without other rash. Neurologic: Mental status is normal, cranial nerves are intact, there are no motor or sensory deficits.  ED Treatments / Results  Labs (all labs ordered are listed, but only abnormal results are displayed) Labs Reviewed  COMPREHENSIVE METABOLIC PANEL - Abnormal; Notable for the following:       Result Value   Sodium 129 (*)    Chloride 94 (*)    Glucose, Bld 537 (*)    Total Protein 8.6 (*)    All other components within normal limits  CBC WITH DIFFERENTIAL/PLATELET - Abnormal; Notable for the following:    WBC 11.0 (*)    Platelets 484 (*)    All other components within normal limits  URINALYSIS, ROUTINE W REFLEX MICROSCOPIC (NOT AT [facility]) - Abnormal; Notable for the following:    Specific Gravity, Urine 1.042 (*)    Glucose, UA >1000 (*)    Hgb urine dipstick MODERATE (*)    All other components within normal limits  URINE MICROSCOPIC-ADD ON - Abnormal; Notable for the following:    Squamous Epithelial / LPF 0-5 (*)    All other components within normal limits  I-STAT CG4 LACTIC ACID, ED - Abnormal; Notable for the following:    Lactic Acid, Venous 2.24 (*)    All other components within normal limits  CULTURE, BLOOD (ROUTINE X 2)  CULTURE, BLOOD (ROUTINE X 2)  URINE CULTURE  PREGNANCY, URINE  SEDIMENTATION RATE    Procedures Procedures (including critical care time)  Medications Ordered in ED Medications  oxyCODONE-acetaminophen (PERCOCET/ROXICET) 5-325 MG per tablet 1  tablet (1 tablet Oral Given 10/21/16 2333)  vancomycin (VANCOCIN) IVPB 1000 mg/200 mL premix (1,000 mg Intravenous New Bag/Given 10/22/16 0241)  sodium chloride 0.9 % bolus 1,000 mL (not administered)  insulin aspart (novoLOG) injection 10 Units (not administered)  piperacillin-tazobactam (ZOSYN) IVPB 3.375 g (3.375 g Intravenous New Bag/Given 10/22/16 0208)  morphine 4 MG/ML injection 4 mg (4 mg Intravenous Given 10/22/16 0207)  sodium chloride 0.9 % bolus 1,000 mL (1,000 mLs Intravenous New Bag/Given 10/22/16 0240)  sodium chloride 0.9 % bolus 1,000 mL (1,000 mLs Intravenous New Bag/Given 10/22/16 0207)     Initial Impression / Assessment and Plan / ED Course  I have reviewed the triage vital signs and the  nursing notes.  Pertinent labs & imaging results that were available during my care of the patient were reviewed by me and considered in my medical decision making (see chart for details).  Clinical Course    Cellulitis of the left foot which is getting worse in spite of appropriate treatment. Old records are reviewed confirming ED visit [facility] one week ago with prescriptions given for cephalexin and trimethoprim-sulfamethoxazole as well as ketoconazole. According to the note, she was supposed to return for venous Doppler, but that never happened. She presents as a failure of outpatient treatment with what seemed to be appropriate antibiotics. Only sign of sepsis is tachycardia, but sepsis workup is initiated. She is started on vancomycin and Zosyn.  WBC is come back only mildly elevated. Glucose is severely elevated at over 500 but without evidence of ketoacidosis. She is given a dose of intravenous insulin. Lactic acid level has come back minimally elevated, so early goal-directed fluid treatment has been initiated. Case is discussed with Dr. Toniann FailKakrakandy of [facility] who agrees to admit the patient.  Final Clinical Impressions(s) / ED Diagnoses    Final diagnoses:  Cellulitis and abscess of toe of right foot  Hyperglycemia  Failure of outpatient treatment    New Prescriptions New Prescriptions   No medications on file     Dione Boozeavid Zakariya Knickerbocker, MD 10/22/16 [phone]

## 2016-10-23 DIAGNOSIS — E1142 Type 2 diabetes mellitus with diabetic polyneuropathy: Secondary | ICD-10-CM

## 2016-10-23 DIAGNOSIS — M79604 Pain in right leg: Secondary | ICD-10-CM

## 2016-10-23 LAB — CBC
HEMATOCRIT: 32.9 % — AB (ref 36.0–46.0)
HEMOGLOBIN: 11.1 g/dL — AB (ref 12.0–15.0)
MCH: 28.2 pg (ref 26.0–34.0)
MCHC: 33.7 g/dL (ref 30.0–36.0)
MCV: 83.7 fL (ref 78.0–100.0)
Platelets: 365 10*3/uL (ref 150–400)
RBC: 3.93 MIL/uL (ref 3.87–5.11)
RDW: 13.2 % (ref 11.5–15.5)
WBC: 8.3 10*3/uL (ref 4.0–10.5)

## 2016-10-23 LAB — GLUCOSE, CAPILLARY
GLUCOSE-CAPILLARY: 361 mg/dL — AB (ref 65–99)
GLUCOSE-CAPILLARY: 331 mg/dL — AB (ref 65–99)
GLUCOSE-CAPILLARY: 297 mg/dL — AB (ref 65–99)
Glucose-Capillary: 345 mg/dL — ABNORMAL HIGH (ref 65–99)
Glucose-Capillary: 260 mg/dL — ABNORMAL HIGH (ref 65–99)

## 2016-10-23 LAB — COMPREHENSIVE METABOLIC PANEL
ALBUMIN: 2.4 g/dL — AB (ref 3.5–5.0)
ALT: 19 U/L (ref 14–54)
ANION GAP: 5 (ref 5–15)
AST: 17 U/L (ref 15–41)
Alkaline Phosphatase: 66 U/L (ref 38–126)
BILIRUBIN TOTAL: 0.7 mg/dL (ref 0.3–1.2)
BUN: 6 mg/dL (ref 6–20)
CO2: 27 mmol/L (ref 22–32)
Calcium: 8.5 mg/dL — ABNORMAL LOW (ref 8.9–10.3)
Chloride: 100 mmol/L — ABNORMAL LOW (ref 101–111)
Creatinine, Ser: 0.66 mg/dL (ref 0.44–1.00)
GLUCOSE: 376 mg/dL — AB (ref 65–99)
POTASSIUM: 3.7 mmol/L (ref 3.5–5.1)
Sodium: 132 mmol/L — ABNORMAL LOW (ref 135–145)
TOTAL PROTEIN: 5.5 g/dL — AB (ref 6.5–8.1)

## 2016-10-23 LAB — HEMOGLOBIN A1C
HEMOGLOBIN A1C: 11.5 % — AB (ref 4.8–5.6)
Mean Plasma Glucose: 283 mg/dL

## 2016-10-23 LAB — URINE CULTURE: CULTURE: NO GROWTH

## 2016-10-23 MED ORDER — POTASSIUM CHLORIDE CRYS ER 20 MEQ PO TBCR
40.0000 meq | EXTENDED_RELEASE_TABLET | Freq: Once | ORAL | Status: AC
  Administered 2016-10-23: 40 meq via ORAL
  Filled 2016-10-23: qty 2

## 2016-10-23 MED ORDER — LIVING WELL WITH DIABETES BOOK
Freq: Once | Status: AC
  Administered 2016-10-23: 15:00:00
  Filled 2016-10-23: qty 1

## 2016-10-23 MED ORDER — INSULIN ASPART 100 UNIT/ML ~~LOC~~ SOLN
20.0000 [IU] | Freq: Three times a day (TID) | SUBCUTANEOUS | Status: DC
  Administered 2016-10-23 – 2016-10-25 (×3): 20 [IU] via SUBCUTANEOUS

## 2016-10-23 MED ORDER — INSULIN DETEMIR 100 UNIT/ML ~~LOC~~ SOLN
50.0000 [IU] | Freq: Two times a day (BID) | SUBCUTANEOUS | Status: DC
  Administered 2016-10-23 – 2016-10-25 (×3): 50 [IU] via SUBCUTANEOUS
  Filled 2016-10-23 (×5): qty 0.5

## 2016-10-23 NOTE — Progress Notes (Signed)
PROGRESS NOTE    Lindsay Love  ZOX:[id number] DOB: 12/24/1980 DOA: 10/22/2016 PCP: No PCP Per Patient   Subjective: Patient is doing well. Denies worsening of symptoms. She does state she does not have sensation of her feet but this is unchanged.  Brief Narrative:  Lindsay Love is a 35 y.o. female  with recent diagnosis of right foot cellulitis for ten days, failed OP Bactrim aand Keflex, [facility] with worsening symptoms. She is now on zosyn and vancomycin. Noncontrast MR of right foot 11/27 shows diffuse cellulitis and myofasciitis without discrete drainable abscess, septic arthritis or osteomyelitis. Right lower extremity venous duplex on 11/27 is negative. Improvement in her labs as follows: WBC 8.4, platelets 365, glucose 361, lactic acid 1.0, and hb 11.1 on 11/28. Her hbA1c on 11/28 is 11.5.  Assessment & Plan:   Principal Problem:   Cellulitis of right foot Active Problems:   Diabetes (HCC)   IBS (irritable bowel syndrome)   Asthma   Right leg pain   Cellulitis and abscess of toe of right foot   Diabetic peripheral neuropathy (HCC)   Failure of outpatient treatment   Cellulitis of the right foot  - Failed OP management with Augmentin, Azithromycin, Bactrim and Keflex.  - Pain extends to the right leg   - WBC 8.4. Afebrile. Initial lactic acid 2.24, it is now 1.0 on 11/28.  VSS, nontoxic appearing.  - Noncontrast MR of right foot 11/27 shows diffuse cellulitis and myofasciitis without discrete drainable abscess, septic arthritis or osteomyelitis  - Right lower extremity venous duplex on 11/27 is negative  - Continue broad spectrum antibiotics with vancomycin and Zosyn - Blood cultures pending - Possible surgery consult in regards to potential drainage.  Type II Diabetes  - Blood sugar level was 537 on admission without evidence of ketoacidosis, now 361 after receiving 1 dose of IV insulin 10 U  - Her hbA1c on 11/28 is 11.5. - Increase the  Levemir insulin to 50 BID, and adjust dose according to the CBGs, start Novolog at 20 units  Anemia in setting of acute illness - Hb 11.1 on 11/28, will follow.   DVT prophylaxis: Lovenox Code Status: Full code Family Communication: None at bedside Disposition Plan: Remain inpatient   Consultants:   General surgery  Procedures:   Venous duplex 11/27  Antimicrobials:   Zosyn 11/27 >>  Vancomycin 11/27 >>>     Objective: Vitals:   10/22/16 1628 10/22/16 2057 10/23/16 0505 10/23/16 0900  BP: 114/69 120/75 127/72 131/74  Pulse: 100 92 91 96  Resp: 18 17 20    Temp: 97.9 F (36.6 C) 98.2 F (36.8 C) 98.3 F (36.8 C) 97.8 F (36.6 C)  TempSrc: Oral     SpO2: 98% 98% 99% 100%  Weight:      Height:        Intake/Output Summary (Last 24 hours) at 10/23/16 1020 Last data filed at 10/23/16 0926  Gross per 24 hour  Intake          3916.66 ml  Output                0 ml  Net          3916.66 ml   Filed Weights   10/21/16 2324 10/22/16 0524  Weight: 99.8 kg (220 lb) 104.6 kg (230 lb 9.6 oz)    Examination:  General exam: Appears calm and comfortable  Respiratory system: Clear to auscultation. Respiratory effort normal. Cardiovascular system:  S1 & S2 heard, RRR. No JVD, murmurs, rubs, gallops or clicks. Bilateral pedal edema Central nervous system: Alert and oriented.  Extremities: Right foot cellulitis. Erythematous, pus-filled. Decreased sensation of plantar aspect of feet bilaterally.  Skin:  Erythematous, pus-filled area of right foot. No rashes, lesions or ulcers Psychiatry: Judgement and insight appear normal. Mood & affect appropriate.     Data Reviewed: I have personally reviewed following labs and imaging studies  CBC:  Recent Labs Lab 10/22/16 0139 10/23/16 0448  WBC 11.0* 8.3  NEUTROABS 7.0  --   HGB 14.6 11.1*  HCT 41.7 32.9*  MCV 82.6 83.7  PLT 484* 365   Basic Metabolic Panel:  Recent Labs Lab 10/22/16 0139 10/23/16 0448  NA  129* 132*  K 4.3 3.7  CL 94* 100*  CO2 24 27  GLUCOSE 537* 376*  BUN 15 6  CREATININE 0.67 0.66  CALCIUM 9.8 8.5*   GFR: Estimated Creatinine Clearance: 122.1 mL/min (by C-G formula based on SCr of 0.66 mg/dL). Liver Function Tests:  Recent Labs Lab 10/22/16 0139 10/23/16 0448  AST 21 17  ALT 22 19  ALKPHOS 81 66  BILITOT 0.5 0.7  PROT 8.6* 5.5*  ALBUMIN 3.7 2.4*   No results for input(s): LIPASE, AMYLASE in the last 168 hours. No results for input(s): AMMONIA in the last 168 hours. Coagulation Profile: No results for input(s): INR, PROTIME in the last 168 hours. Cardiac Enzymes: No results for input(s): CKTOTAL, CKMB, CKMBINDEX, TROPONINI in the last 168 hours. BNP (last 3 results) No results for input(s): PROBNP in the last 8760 hours. HbA1C:  Recent Labs  10/22/16 1103  HGBA1C 11.5*   CBG:  Recent Labs Lab 10/22/16 1136 10/22/16 1639 10/22/16 2146 10/23/16 0311 10/23/16 0804  GLUCAP 368* 291* 362* 361* 345*   Lipid Profile: No results for input(s): CHOL, HDL, LDLCALC, TRIG, CHOLHDL, LDLDIRECT in the last 72 hours. Thyroid Function Tests: No results for input(s): TSH, T4TOTAL, FREET4, T3FREE, THYROIDAB in the last 72 hours. Anemia Panel: No results for input(s): VITAMINB12, FOLATE, FERRITIN, TIBC, IRON, RETICCTPCT in the last 72 hours. Sepsis Labs:  Recent Labs Lab 10/22/16 0154 10/22/16 1446 10/22/16 1843  LATICACIDVEN 2.24* 0.9 1.0    Recent Results (from the past 240 hour(s))  Urine culture     Status: None   Collection Time: 10/22/16  1:13 AM  Result Value Ref Range Status   Specimen Description URINE, RANDOM  Final   Special Requests NONE  Final   Culture NO GROWTH [facility]   Final   Report Status 10/23/2016 FINAL  Final  Blood Culture (routine x 2)     Status: None (Preliminary result)   Collection Time: 10/22/16  1:40 AM  Result Value Ref Range Status   Specimen Description BLOOD LEFT FOREARM  Final    Special Requests BOTTLES DRAWN AEROBIC AND ANAEROBIC 5CC  Final   Culture PENDING  Incomplete   Report Status PENDING  Incomplete  Blood Culture (routine x 2)     Status: None (Preliminary result)   Collection Time: 10/22/16  1:40 AM  Result Value Ref Range Status   Specimen Description BLOOD RIGHT FOREARM  Final   Special Requests BOTTLES DRAWN AEROBIC AND ANAEROBIC 5CC  Final   Culture PENDING  Incomplete   Report Status PENDING  Incomplete         Radiology Studies: Mr Foot Right Wo Contrast  Result Date: 10/23/2016 CLINICAL DATA:  Cellulitis with fairly air of treatment.  Persistent redness and swelling involving the foot. EXAM: MRI OF THE RIGHT FOREFOOT WITHOUT CONTRAST TECHNIQUE: Multiplanar, multisequence MR imaging of the right foot was performed. No intravenous contrast was administered. COMPARISON:  Right ankle radiographs 11/22/2015 FINDINGS: Diffuse subcutaneous soft tissue swelling/ edema/ fluid involving the entire foot and ankle. There is also diffuse edema like signal abnormality involving the foot musculature consistent with myofasciitis. No discrete drainable fluid collection to suggest an abscess. No findings suspicious for septic arthritis or osteomyelitis. The major tendons and ligaments are intact. IMPRESSION: Diffuse cellulitis and myofasciitis without discrete drainable abscess, septic arthritis or osteomyelitis. Electronically Signed   By: Rudie Meyer M.D.   On: 10/23/2016 08:33        Scheduled Meds: . enoxaparin (LOVENOX) injection  50 mg Subcutaneous Q24H  . insulin aspart  0-15 Units Subcutaneous TID WC  . insulin aspart  0-5 Units Subcutaneous QHS  . insulin detemir  40 Units Subcutaneous BID  . piperacillin-tazobactam (ZOSYN)  IV  3.375 g Intravenous Q8H  . sodium chloride flush  3 mL Intravenous Q12H  . vancomycin  1,000 mg Intravenous Q8H   Continuous Infusions: . sodium chloride 100 mL/hr at 10/23/16 0000     LOS: 1 day    Time spent:  25    Randa Lynn, PA-S  Wrote some of this note [facility] Pager [phone]  If 7PM-7AM, please contact night-coverage [email or web address] Password [email or web address] 10/23/2016, 10:20 AM

## 2016-10-23 NOTE — Progress Notes (Addendum)
Inpatient Diabetes Program Recommendations  AACE/ADA: New Consensus Statement on Inpatient Glycemic Control (2015)  Target Ranges:  Prepandial:   less than 140 mg/dL      Peak postprandial:   less than 180 mg/dL (1-2 hours)      Critically ill patients:  140 - 180 mg/dL   Lab Results  Component Value Date   GLUCAP 345 (H) 10/23/2016   HGBA1C 11.5 (H) 10/22/2016    Review of Glycemic Control  Results for Lindsay Love, [name] (MRN [id number]) as of 10/23/2016 11:40  Ref. Range 10/22/2016 11:36 10/22/2016 16:39 10/22/2016 21:46 10/23/2016 03:11 10/23/2016 08:04  Glucose-Capillary Latest Ref Range: 65 - 99 mg/dL 409368 (H) 811291 (H) 914362 (H) 361 (H) 345 (H)    Diabetes history: DM2  Outpatient Diabetes medications: Novolog 35-45 units QID, Levemir 100 units qday at hs, Metformin 500 mg BID  Current orders for Inpatient glycemic control: Novolog 0-15 units TID, Novolog 0-5 units qhs  Inpatient Diabetes Program Recommendations - Please consider basal insulin,  Levemir 30 units (100 Kg x 0.3 units/Kg) BID.  Susette RacerJulie Adriauna Campton, RN, BA, MHA, CDE Diabetes Coordinator Inpatient Diabetes Program  [phone] (Team Pager) [phone] [facility] Office) 10/23/2016 11:45 AM

## 2016-10-23 NOTE — Progress Notes (Signed)
Inpatient Diabetes Program Recommendations  AACE/ADA: New Consensus Statement on Inpatient Glycemic Control (2015)  Target Ranges:  Prepandial:   less than 140 mg/dL      Peak postprandial:   less than 180 mg/dL (1-2 hours)      Critically ill patients:  140 - 180 mg/dL   Lab Results  Component Value Date   GLUCAP 331 (H) 10/23/2016   HGBA1C 11.5 (H) 10/22/2016    Review of Glycemic Control  Results for [name], Lindsay Love (MRN [id number]) as of 10/23/2016 14:52  Ref. Range 10/22/2016 16:39 10/22/2016 21:46 10/23/2016 03:11 10/23/2016 08:04 10/23/2016 12:08  Glucose-Capillary Latest Ref Range: 65 - 99 mg/dL 782291 (H) 956362 (H) 213361 (H) 345 (H) 331 (H)    Diabetes history: Type 2 Outpatient Diabetes medications: Levemir 100 units q day, Novolog 35- 45 units tid with meals  Spent a great deal of time with the patient regarding her diabetes.  Last A1C was >12 % but prior to that, it had been 11% when she was on [place].  MD had to stop Evaristo Buryresiba because it was not covered by her insurance.  Encouraged the patient to check blood sugars 3-4 times per day and work with her physician to increase/ add or change diabetes medications as needed to decrease blood sugars.  Encourage the patient to discuss insulin pump therapy, continuous glucose monitor, Invokana, Victoza, and/or Metformin (she was taken off of it).  Strongly encouraged to use the plate method of eating. Encouraged three meals per day. Patient very aware that elevated blood sugars are "killing me" but frustrated that "I'm doing everything they're telling me to do".  Encouraged to make a follow up appointment with her endocrinologist Dr. Huel CoteMonica Doer for early next week.  Susette RacerJulie Albi Rappaport, RN, BA, MHA, CDE Diabetes Coordinator Inpatient Diabetes Program  [phone] (Team Pager) [phone] [facility] Office) 10/23/2016 3:00 PM

## 2016-10-23 NOTE — Progress Notes (Signed)
Occupational Therapy Evaluation Patient Details Name: Lindsay Love MRN: [id number] DOB: 04/29/1981 Today's Date: 10/23/2016    History of Present Illness 35 y.o. female  who presents with R foot cellulitis.  Poorly controlled DM at baseline w/ presenting glucose >500 w/o ketoacidosis.   Clinical Impression   PTA, pt lived at home with her partner and was independent with mobility and ADL. Pt states she fatigues more easily and is concerned about her balance due to her "numb feet". Pt became tearful during the session and said "I'm only 35 and I can't accept that my body is doing this". Will follow acutely to establish a HEP and educate on energy conservation and reducing risk of falls at home. Pt very appreciative.     Follow Up Recommendations  No OT follow up;Supervision - Intermittent    Equipment Recommendations  Tub/shower bench    Recommendations for Other Services       Precautions / Restrictions Precautions Precautions: Fall      Mobility Bed Mobility Overal bed mobility: Independent                Transfers Overall transfer level: Independent                    Balance Overall balance assessment:  (need to further assess. Pt reports recent fall)                                          ADL Overall ADL's : Needs assistance/impaired                                     Functional mobility during ADLs: Modified independent General ADL Comments: Pt states she fatigues easily with ADL and gets light headed when bending over to pick up items from floor. States she has tripped over the tub and has difficulty keeping her balance at times in the shower when she closes her eyes      Vision     Perception     Praxis      Pertinent Vitals/Pain Pain Assessment: 0-10 Pain Score: 4  Pain Location: R foot Pain Descriptors / Indicators: Discomfort Pain Intervention(s): Limited activity within patient's tolerance      Hand Dominance Right   Extremity/Trunk Assessment Upper Extremity Assessment Upper Extremity Assessment: Overall WFL for tasks assessed   Lower Extremity Assessment Lower Extremity Assessment: Defer to PT evaluation (R foot edem; "NUMB FEET")   Cervical / Trunk Assessment Cervical / Trunk Assessment: Normal   Communication Communication Communication: No difficulties   Cognition Arousal/Alertness: Awake/alert Behavior During Therapy: WFL for tasks assessed/performed Overall Cognitive Status: Within Functional Limits for tasks assessed                     General Comments       Exercises       Shoulder Instructions      Home Living Family/patient expects to be discharged to:: Private residence Living Arrangements: Spouse/significant other Available Help at Discharge: Family;Available PRN/intermittently Type of Home: House Home Access: Stairs to enter Entergy CorporationEntrance Stairs-Number of Steps: 4 Entrance Stairs-Rails: Right Home Layout: Two level;Able to live on main level with bedroom/bathroom     Bathroom Shower/Tub: Tub/shower unit;Curtain Shower/tub characteristics: Engineer, building servicesCurtain Bathroom Toilet: Standard Bathroom Accessibility: Yes How Accessible:  Accessible via walker Home Equipment: None          Prior Functioning/Environment Level of Independence: Independent                 OT Problem List: Decreased knowledge of use of DME or AE;Impaired sensation;Obesity;Pain   OT Treatment/Interventions: Self-care/ADL training;Therapeutic exercise;Energy conservation;DME and/or AE instruction;Therapeutic activities;Patient/family education    OT Goals(Current goals can be found in the care plan section) Acute Rehab OT Goals Patient Stated Goal: to get better OT Goal Formulation: With patient Time For Goal Achievement: 11/06/16 Potential to Achieve Goals: Good ADL Goals Pt Will Perform Tub/Shower Transfer: with modified independence;tub bench Pt/caregiver  will Perform Home Exercise Program: Increased strength;Both right and left upper extremity;With theraband;With written HEP provided Additional ADL Goal #1: Pt will verbalize 3 evergy conservation techniques  OT Frequency: Min 2X/week   Barriers to D/C:            Co-evaluation              End of Session Nurse Communication: Mobility status  Activity Tolerance: Patient tolerated treatment well Patient left: in bed;with call bell/phone within reach   Time: 1505-1520 OT Time Calculation (min): 15 min Charges:  OT General Charges $OT Visit: 1 Procedure OT Evaluation $OT Eval Moderate Complexity: 1 Procedure G-Codes:    [name],[name] 10/23/2016, 8:41 PM [name], OTR/L  [phone] 10/23/2016

## 2016-10-23 NOTE — Care Management Note (Signed)
Case Management Note  Patient Details  Name: Lindsay CamaraBrandena Love MRN: [id number] Date of Birth: 01/06/1981  Subjective/Objective:       CM following for progression and d/c planning,             Action/Plan: 10/23/2016 Plan to d/c to home, [facility]   Expected Discharge Date:                  Expected Discharge Plan:  Home w Home Health Services  In-House Referral:  NA  Discharge planning Services  CM Consult  Post Acute Care Choice:  Home Health Choice offered to:  Patient  DME Arranged:    DME Agency:     HH Arranged:  RN, PT HH Agency:     Status of Service:  In process, will continue to follow  If discussed at Long Length of Stay Meetings, dates discussed:    Additional Comments:  Starlyn SkeansRoyal, Ceil Roderick U, RN 10/23/2016, 2:38 PM

## 2016-10-24 DIAGNOSIS — Z794 Long term (current) use of insulin: Secondary | ICD-10-CM

## 2016-10-24 DIAGNOSIS — E118 Type 2 diabetes mellitus with unspecified complications: Secondary | ICD-10-CM

## 2016-10-24 DIAGNOSIS — L03115 Cellulitis of right lower limb: Secondary | ICD-10-CM

## 2016-10-24 DIAGNOSIS — J4520 Mild intermittent asthma, uncomplicated: Secondary | ICD-10-CM

## 2016-10-24 LAB — BASIC METABOLIC PANEL
Anion gap: 6 (ref 5–15)
BUN: 6 mg/dL (ref 6–20)
CALCIUM: 8.9 mg/dL (ref 8.9–10.3)
CO2: 27 mmol/L (ref 22–32)
CREATININE: 0.8 mg/dL (ref 0.44–1.00)
Chloride: 103 mmol/L (ref 101–111)
GFR calc Af Amer: >60 mL/min (ref >60)
GLUCOSE: 248 mg/dL — AB (ref 65–99)
Potassium: 3.8 mmol/L (ref 3.5–5.1)
Sodium: 136 mmol/L (ref 135–145)

## 2016-10-24 LAB — CBC
HEMATOCRIT: 35.9 % — AB (ref 36.0–46.0)
Hemoglobin: 12.3 g/dL (ref 12.0–15.0)
MCH: 28.8 pg (ref 26.0–34.0)
MCHC: 34.3 g/dL (ref 30.0–36.0)
MCV: 84.1 fL (ref 78.0–100.0)
Platelets: 401 10*3/uL — ABNORMAL HIGH (ref 150–400)
RBC: 4.27 MIL/uL (ref 3.87–5.11)
RDW: 13.2 % (ref 11.5–15.5)
WBC: 8.2 10*3/uL (ref 4.0–10.5)

## 2016-10-24 LAB — GLUCOSE, CAPILLARY
GLUCOSE-CAPILLARY: 225 mg/dL — AB (ref 65–99)
GLUCOSE-CAPILLARY: 181 mg/dL — AB (ref 65–99)
GLUCOSE-CAPILLARY: 217 mg/dL — AB (ref 65–99)
GLUCOSE-CAPILLARY: 217 mg/dL — AB (ref 65–99)
Glucose-Capillary: 181 mg/dL — ABNORMAL HIGH (ref 65–99)

## 2016-10-24 LAB — VANCOMYCIN, TROUGH: VANCOMYCIN TR: 16 ug/mL (ref 15–20)

## 2016-10-24 MED ORDER — FAMOTIDINE IN NACL 20-0.9 MG/50ML-% IV SOLN
20.0000 mg | Freq: Two times a day (BID) | INTRAVENOUS | Status: DC
  Administered 2016-10-24 – 2016-10-25 (×3): 20 mg via INTRAVENOUS
  Filled 2016-10-24 (×4): qty 50

## 2016-10-24 MED ORDER — LIVING WELL WITH DIABETES BOOK
Freq: Once | Status: AC
  Administered 2016-10-24: 11:00:00
  Filled 2016-10-24: qty 1

## 2016-10-24 MED ORDER — GLUCERNA SHAKE PO LIQD: 237.0000 mL | Freq: Two times a day (BID) | ORAL | Status: DC

## 2016-10-24 MED ORDER — PROMETHAZINE HCL 25 MG/ML IJ SOLN
12.5000 mg | INTRAMUSCULAR | Status: DC | PRN
  Administered 2016-10-24 – 2016-10-25 (×2): 12.5 mg via INTRAVENOUS
  Filled 2016-10-24 (×3): qty 1

## 2016-10-24 MED ORDER — INSULIN ASPART 100 UNIT/ML ~~LOC~~ SOLN
0.0000 [IU] | SUBCUTANEOUS | Status: DC
  Administered 2016-10-24: 3 [IU] via SUBCUTANEOUS
  Administered 2016-10-25: 2 [IU] via SUBCUTANEOUS
  Administered 2016-10-25: 5 [IU] via SUBCUTANEOUS
  Administered 2016-10-25: 2 [IU] via SUBCUTANEOUS
  Administered 2016-10-25: 5 [IU] via SUBCUTANEOUS

## 2016-10-24 NOTE — Progress Notes (Signed)
Initial Nutrition Assessment  DOCUMENTATION CODES:   Obesity unspecified  INTERVENTION:  Once diet advances, provide Glucerna Shake po BID with meals, each supplement provides 220 kcal and 10 grams of protein  Diet handout regarding diabetes given.   NUTRITION DIAGNOSIS:   Increased nutrient needs related to wound healing as evidenced by estimated needs.  GOAL:   Patient will meet greater than or equal to 90% of their needs  MONITOR:   Diet advancement, Supplement acceptance, Labs, Weight trends, Skin, I & O's  REASON FOR ASSESSMENT:   Consult Diet education  ASSESSMENT:   35 y.o. female  with recent diagnosis of right foot cellulitis for ten days, failed OP Bactrim aand Keflex, [facility] with [facility] symptoms. Noncontrast MR of right foot 11/27 shows diffuse cellulitis and myofasciitis without discrete drainable abscess, septic arthritis or osteomyelitis. Her hbA1c on 11/28 is 11.5  Pt is currently NPO due to n/v this AM. Meal completion prior to NPO status was varied from 50-100%. Pt reports some abdominal discomfort during time of visit. Pt reports eating well PTA with usual consumption of at least 3 meals a day. Weight has been stable. RD consulted for diet education regarding diabetes. Pt was trying to rest/sleep during time of visit. Handout was placed at bedside. RD to educate at next visit. RD to order Glucerna Shake to aid in wound healing as pt with cellulitis. Nursing staff to provide once diet advances.   Pt with no observed significant fat or muscle mass loss.   Labs and medications reviewed. CBG 181-361 mg/dL.   Diet Order:  Diet NPO time specified  Skin:   (Cellulitits R foot)  Last BM:  11/27  Height:   Ht Readings from Last 1 Encounters:  10/22/16 5\' 7"  (1.702 m)    Weight:   Wt Readings from Last 1 Encounters:  10/22/16 230 lb 9.6 oz (104.6 kg)    Ideal Body Weight:  61.36 kg  BMI:  Body mass index is 36.12  kg/m.  Estimated Nutritional Needs:   Kcal:  1900-2100  Protein:  105-120 grams  Fluid:  1.9 - 2.1 L/day  EDUCATION NEEDS:   Education needs addressed  Roslyn SmilingStephanie Rashid [place], MS, RD, LDN Pager # [phone] After hours/ weekend pager # [phone]

## 2016-10-24 NOTE — Progress Notes (Signed)
PROGRESS NOTE [facility]   Lindsay CamaraBrandena Himmelberger   ZOX:[id number] DOB: 01/23/1981  DOA: 10/22/2016 PCP: No PCP Per Patient   Brief Narrative:  Lindsay Love is a 35 y.o. female with PMHx Asthma, DM type II IBS,  with recent diagnosis of right foot cellulitis, treated with Bactrim and  Keflex as outpatient, failing its management, [facility] with worsening symptoms. She noted increased redness and swelling of the right food, with pain worse with movement. Was admitted for right foot and toe cellulitis, started on IV abx and fluids.   Subjective: Seen and examined, leg swelling has decrease, erythema improving. Patient this morning c/o nausea and vomiting x 2. Nurse reported event on tele monitor where there was a AV block with drop beat. Event was not saved on tele monitor, printed report to be scanned. Patient denies chest pain, palpitations or dizziness.   Assessment & Plan:  Cellulitis of the right foot - failed OP therapy, improving on IV abx  11/27 MRI of the right foot show diffuse cellulitis and myofascitis without discrete drainable abscess septic arthritis or osteomyelitis Right lower extremity venous Doppler negative Blood cultures pending Ortho recommendations appreciated no surgical interventions at this time We'll continue empiric IV antibiotics with Vanc and Zosyn. If cultures negative for 48 hours, will narrow abx spectrum.   Type II Diabetes - CBGs trending down, A1c 11.5 on 11/28 Continue SSI Continue Levemir current dose Monitor CBGs  Asthma - stable  DVT prophylaxis: Lovenox  Code Status: Full Family Communication: None at bedside  Disposition Plan: Anticipate d/c home when medically stable   Consultants:   Ortho - Dr. Lajoyce Cornersuda  Procedures:   None   Antimicrobials:  Zosyn 11/27  Vanco 11/27   Objective: Vitals:   10/23/16 1757 10/23/16 2107 10/24/16 0548 10/24/16 0705  BP: (!) 148/92 (!) 147/90 126/66 (!) 113/56    Pulse: 88 88 95 87  Resp: 18 18 20 18   Temp: 97.6 F (36.4 C) 98.2 F (36.8 C) 98.8 F (37.1 C) 98.5 F (36.9 C)  TempSrc: Oral     SpO2: 100% 99% 96% 99%  Weight:      Height:        Intake/Output Summary (Last 24 hours) at 10/24/16 1005 Last data filed at 10/24/16 0200  Gross per 24 hour  Intake              630 ml  Output                0 ml  Net              630 ml   Filed Weights   10/21/16 2324 10/22/16 0524  Weight: 99.8 kg (220 lb) 104.6 kg (230 lb 9.6 oz)    Examination:  General exam: Appears calm and comfortable  Respiratory system: Clear to auscultation. No wheezes,crackle or rhonchi Cardiovascular system: S1 & S2 heard, RRR. No JVD, murmurs, rubs or gallops Gastrointestinal system: Abdomen is nondistended, soft and nontender. Normal bowel sounds heard. Extremities: 2+ L pedal edema. Pulse presents and strong  Skin: erythematous and swelling on the R foot per patient has improved from yesterday.   Data Reviewed: I have personally reviewed following labs and imaging studies  CBC:  Recent Labs Lab 10/22/16 0139 10/23/16 0448 10/24/16 0639  WBC 11.0* 8.3 8.2  NEUTROABS 7.0  --   --   HGB 14.6 11.1* 12.3  HCT 41.7 32.9* 35.9*  MCV 82.6 83.7 84.1  PLT 484* 365 401*   Basic Metabolic Panel:  Recent Labs Lab 10/22/16 0139 10/23/16 0448 10/24/16 0639  NA 129* 132* 136  K 4.3 3.7 3.8  CL 94* 100* 103  CO2 24 27 27   GLUCOSE 537* 376* 248*  BUN 15 6 6   CREATININE 0.67 0.66 0.80  CALCIUM 9.8 8.5* 8.9   GFR: Estimated Creatinine Clearance: 122.1 mL/min (by C-G formula based on SCr of 0.8 mg/dL). Liver Function Tests:  Recent Labs Lab 10/22/16 0139 10/23/16 0448  AST 21 17  ALT 22 19  ALKPHOS 81 66  BILITOT 0.5 0.7  PROT 8.6* 5.5*  ALBUMIN 3.7 2.4*   No results for input(s): LIPASE, AMYLASE in the last 168 hours. No results for input(s): AMMONIA in the last 168 hours. Coagulation Profile: No results for input(s): INR, PROTIME in the  last 168 hours. Cardiac Enzymes: No results for input(s): CKTOTAL, CKMB, CKMBINDEX, TROPONINI in the last 168 hours. BNP (last 3 results) No results for input(s): PROBNP in the last 8760 hours. HbA1C:  Recent Labs  10/22/16 1103  HGBA1C 11.5*   CBG:  Recent Labs Lab 10/23/16 0804 10/23/16 1208 10/23/16 1642 10/23/16 2106 10/24/16 0659  GLUCAP 345* 331* 297* 260* 225*   Lipid Profile: No results for input(s): CHOL, HDL, LDLCALC, TRIG, CHOLHDL, LDLDIRECT in the last 72 hours. Thyroid Function Tests: No results for input(s): TSH, T4TOTAL, FREET4, T3FREE, THYROIDAB in the last 72 hours. Anemia Panel: No results for input(s): VITAMINB12, FOLATE, FERRITIN, TIBC, IRON, RETICCTPCT in the last 72 hours. Sepsis Labs:  Recent Labs Lab 10/22/16 0154 10/22/16 1446 10/22/16 1843  LATICACIDVEN 2.24* 0.9 1.0    Recent Results (from the past 240 hour(s))  Urine culture     Status: None   Collection Time: 10/22/16  1:13 AM  Result Value Ref Range Status   Specimen Description URINE, RANDOM  Final   Special Requests NONE  Final   Culture NO GROWTH Performed at [facility]   Final   Report Status 10/23/2016 FINAL  Final  Blood Culture (routine x 2)     Status: None (Preliminary result)   Collection Time: 10/22/16  1:40 AM  Result Value Ref Range Status   Specimen Description BLOOD LEFT FOREARM  Final   Special Requests BOTTLES DRAWN AEROBIC AND ANAEROBIC 5CC  Final   Culture   Final    NO GROWTH 1 DAY Performed at [facility]    Report Status PENDING  Incomplete  Blood Culture (routine x 2)     Status: None (Preliminary result)   Collection Time: 10/22/16  1:40 AM  Result Value Ref Range Status   Specimen Description BLOOD RIGHT FOREARM  Final   Special Requests BOTTLES DRAWN AEROBIC AND ANAEROBIC 5CC  Final   Culture   Final    NO GROWTH 1 DAY Performed at [facility]    Report Status PENDING  Incomplete     Radiology Studies: Mr Foot Right  Wo Contrast  Result Date: 10/23/2016 CLINICAL DATA:  Cellulitis with fairly air of treatment. Persistent redness and swelling involving the foot. EXAM: MRI OF THE RIGHT FOREFOOT WITHOUT CONTRAST TECHNIQUE: Multiplanar, multisequence MR imaging of the right foot was performed. No intravenous contrast was administered. COMPARISON:  Right ankle radiographs 11/22/2015 FINDINGS: Diffuse subcutaneous soft tissue swelling/ edema/ fluid involving the entire foot and ankle. There is also diffuse edema like signal abnormality involving the foot musculature consistent with myofasciitis. No discrete drainable fluid collection to suggest an abscess.  No findings suspicious for septic arthritis or osteomyelitis. The major tendons and ligaments are intact. IMPRESSION: Diffuse cellulitis and myofasciitis without discrete drainable abscess, septic arthritis or osteomyelitis. Electronically Signed   By: Rudie MeyerP.  Gallerani M.D.   On: 10/23/2016 08:33     Scheduled Meds: . enoxaparin (LOVENOX) injection  50 mg Subcutaneous Q24H  . famotidine (PEPCID) IV  20 mg Intravenous Q12H  . insulin aspart  0-15 Units Subcutaneous TID WC  . insulin aspart  20 Units Subcutaneous TID WC  . insulin detemir  50 Units Subcutaneous BID  . piperacillin-tazobactam (ZOSYN)  IV  3.375 g Intravenous Q8H  . sodium chloride flush  3 mL Intravenous Q12H  . vancomycin  1,000 mg Intravenous Q8H   Continuous Infusions:   LOS: 2 days    Latrelle DodrillEdwin Silva, MD [facility] Pager [phone]  If 7PM-7AM, please contact night-coverage [email or web address] Password [email or web address] 10/24/2016, 10:05 AM

## 2016-10-24 NOTE — Progress Notes (Signed)
Patient had a 30 sec run of 2nd degree HB/3rd degree HB at 0634 this am. At this time patient also had an episode of vomiting with 100cc of bile looking emesis.  Vitals were unremarkable. CBG 225. 12 lead EKG said NSR. Pt now in NSR on telemetry and says she feels better. MD notified, will continue to monitor.

## 2016-10-24 NOTE — Progress Notes (Signed)
Dr Edward JollySilva made pt NPO due to Nausea and vomiting, and we are to hold her insulin coverage at meal times as well as Levemir.

## 2016-10-24 NOTE — Consult Note (Signed)
ORTHOPAEDIC CONSULTATION  REQUESTING PHYSICIAN: Lenox PondsEdwin Silva Zapata, MD  Chief Complaint: Cellulitis right great toe  HPI: Lindsay Love is a 35 y.o. female who presents with a 11 day history of cellulitis of right great toe patient states that she has undergone oral antibiotics as an outpatient was [facility] and was transferred here due to persistent cellulitis. Patient states that her foot is looking much better on IV antibiotics.  Past Medical History:  Diagnosis Date  . Asthma   . Cellulitis of left foot   . Diabetes mellitus without complication (HCC)   . IBS (irritable bowel syndrome)    Past Surgical History:  Procedure Laterality Date  . CESAREAN SECTION    . CHOLECYSTECTOMY    . CYST EXCISION    . TONSILLECTOMY    . TOOTH EXTRACTION     Social History   Social History  . Marital status: Single    Spouse name: N/A  . Number of children: N/A  . Years of education: N/A   Occupational History  . unemployed    Social History Main Topics  . Smoking status: Never Smoker  . Smokeless tobacco: Never Used  . Alcohol use No  . Drug use: No  . Sexual activity: Yes    Birth control/ protection: None   Other Topics Concern  . None   Social History Narrative  . None   Family History  Problem Relation Age of Onset  . Diabetes Mother   . Diabetes Father    - negative except otherwise stated in the family history section No Known Allergies Prior to Admission medications   Medication Sig Start Date End Date Taking? Authorizing Provider  metFORMIN (GLUCOPHAGE) 500 MG tablet Take 500 mg by mouth 2 (two) times daily with a meal.   Yes Historical Provider, MD  cephALEXin (KEFLEX) 500 MG capsule Take 500 mg by mouth 4 (four) times daily.    Historical Provider, MD  HYDROcodone-acetaminophen (NORCO) 5-325 MG tablet Take 1-2 tablets by mouth every 6 (six) hours as needed (for eye pain). 12/28/15   John Molpus, MD  insulin aspart (NOVOLOG) 100  UNIT/ML injection Inject 35 Units into the skin 2 (two) times daily before a meal. Patient taking differently: Inject 35-45 Units into the skin 4 (four) times daily. Pt takes with meals 3x/day and a snack CBG >400 = 45 units CBG > 300 = 35 units 08/31/16   Doug SouSam Jacubowitz, MD  insulin detemir (LEVEMIR) 100 UNIT/ML injection Inject 1 mL (100 Units total) into the skin at bedtime. Patient taking differently: Inject 100 Units into the skin daily.  08/31/16   Doug SouSam Jacubowitz, MD  metroNIDAZOLE (FLAGYL) 500 MG tablet Take 1 tablet (500 mg total) by mouth 2 (two) times daily. 08/31/16   Doug SouSam Jacubowitz, MD  naphazoline-pheniramine (NAPHCON-A) 0.025-0.3 % ophthalmic solution Place 1 drop into the left eye every 4 (four) hours as needed for irritation. 03/24/16   Joycie PeekBenjamin Cartner, PA-C  ondansetron (ZOFRAN) 4 MG tablet Take 1 tablet (4 mg total) by mouth every 6 (six) hours as needed for nausea or vomiting. 09/11/15   Lavera Guiseana Duo Liu, MD  promethazine (PHENERGAN) 25 MG tablet Take 1 tablet (25 mg total) by mouth every 8 (eight) hours as needed for nausea or vomiting. 03/21/16   Charlestine Nighthristopher Lawyer, PA-C  sulfamethoxazole-trimethoprim (BACTRIM DS,SEPTRA DS) 800-160 MG tablet Take 1 tablet by mouth 2 (two) times daily.    Historical Provider, MD   Mr Foot Right  Wo Contrast  Result Date: 10/23/2016 CLINICAL DATA:  Cellulitis with fairly air of treatment. Persistent redness and swelling involving the foot. EXAM: MRI OF THE RIGHT FOREFOOT WITHOUT CONTRAST TECHNIQUE: Multiplanar, multisequence MR imaging of the right foot was performed. No intravenous contrast was administered. COMPARISON:  Right ankle radiographs 11/22/2015 FINDINGS: Diffuse subcutaneous soft tissue swelling/ edema/ fluid involving the entire foot and ankle. There is also diffuse edema like signal abnormality involving the foot musculature consistent with myofasciitis. No discrete drainable fluid collection to suggest an abscess. No findings suspicious for  septic arthritis or osteomyelitis. The major tendons and ligaments are intact. IMPRESSION: Diffuse cellulitis and myofasciitis without discrete drainable abscess, septic arthritis or osteomyelitis. Electronically Signed   By: Rudie MeyerP.  Gallerani M.D.   On: 10/23/2016 08:33   - pertinent xrays, CT, MRI studies were reviewed and independently interpreted  Positive ROS: All other systems have been reviewed and were otherwise negative with the exception of those mentioned in the HPI and as above.  Physical Exam: General: Alert, no acute distress Psychiatric: Patient is competent for consent with normal mood and affect Lymphatic: No axillary or cervical lymphadenopathy Cardiovascular: No pedal edema Respiratory: No cyanosis, no use of accessory musculature GI: No organomegaly, abdomen is soft and non-tender  Skin: Examination patient has some superficial blistering around the great toe and the first webspace. There is cellulitis there is no fluctuance no mass no drainage no pain with range of motion of the joints no ascending cellulitis.   Neurologic: Patient does not have protective sensation bilateral lower extremities.   MUSCULOSKELETAL:  Patient has a good dorsalis pedis pulse noted tenderness to palpation No pain with range of motion of the ankle or toes. No indication of a septic joint no fluctuant abscess. MRI scan is reviewed which shows cellulitis but no osteomyelitis or abscess.  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 I will follow-up in the office in 2 weeks. No indication for surgical intervention at this time.  Thank you for the consult and the opportunity to see Ms. Rudene AndaLemons  Marcus Duda, MD [facility] [phone] 7:17 AM

## 2016-10-24 NOTE — Progress Notes (Signed)
Pharmacy Antibiotic Note  Lindsay Love is a 35 y.o. female admitted on 10/22/2016 with cellulitis.  Pharmacy has been consulted for Vancomycin/Zosyn dosing.  VT returns therapeutic at 16 ug/ml on 1g q8h. Remains afebrile, WBC WNL. Slight bump in SCr - 0.66>>0.80. Cultures remain negative to date.  Plan: -Continue Vancomycin 1000 mg IV q8h -Continue Zosyn 3.375G IV q8h to be infused over 4 hours -Monitor renal fxn, clinical course, LOT  Height: 5\' 7"  (170.2 cm) Weight: 230 lb 9.6 oz (104.6 kg) IBW/kg (Calculated) : 61.6  Temp (24hrs), Avg:98.5 F (36.9 C), Min:98 F (36.7 C), Max:98.9 F (37.2 C)   Recent Labs Lab 10/22/16 0139 10/22/16 0154 10/22/16 1446 10/22/16 1843 10/23/16 0448 10/24/16 0639 10/24/16 1830  WBC 11.0*  --   --   --  8.3 8.2  --   CREATININE 0.67  --   --   --  0.66 0.80  --   LATICACIDVEN  --  2.24* 0.9 1.0  --   --   --   VANCOTROUGH  --   --   --   --   --   --  16    Estimated Creatinine Clearance: 122.1 mL/min (by C-G formula based on SCr of 0.8 mg/dL).    No Known Allergies   Sherle Poeob Vincent, PharmD Clinical Pharmacist 7:24 PM, 10/24/2016

## 2016-10-24 NOTE — Progress Notes (Addendum)
Inpatient Diabetes Program Recommendations  AACE/ADA: New Consensus Statement on Inpatient Glycemic Control (2015)  Target Ranges:  Prepandial:   less than 140 mg/dL      Peak postprandial:   less than 180 mg/dL (1-2 hours)      Critically ill patients:  140 - 180 mg/dL   Lab Results  Component Value Date   GLUCAP 225 (H) 10/24/2016   HGBA1C 11.5 (H) 10/22/2016   Review of Glycemic Control  Diabetes history: DM 2 Outpatient Diabetes medications: Levemir 100 units Daily, Novolog 35-40 units (35 with smaller meals 40 units with largest meal) Current orders for Inpatient glycemic control: Levemir 50 units BID, Novolog Moderate correction TID + Novolog 20 units TID meal coverage  Inpatient Diabetes Program Recommendations:   Patient very tired during visit. Spoke with patient about DM control. Patient sees Dr. Girtha HakeMonica E. Doerr (Endocrinology, [facility]) for DM management. Patient last saw Dr. Roanna Raideroerr May 2017. Patient received phone call from Dr. Nicholes Mangooerrs office making an appointment for January 2nd for Endocrinology follow up. Patient reports knowing her A1c is near 12% and has been there for quiet sometime. Patient reports receiving DM education at Dr. Oneita Krasoerr's office and declines further specific education. Patient has not DM questions. I left DM meal planning guide for patient at bedside. Patient reports doing "alright" with meals and exercising "when she can".  Will watch glucose trends on current insulin regimen while inpatient.  Thanks,  Christena DeemShannon Tyrique Sporn RN, MSN, [facility] Inpatient Diabetes Coordinator Team Pager [phone] (8a-5p)

## 2016-10-24 NOTE — Progress Notes (Signed)
12 lead EKG obtained. NSR.

## 2016-10-25 DIAGNOSIS — E1143 Type 2 diabetes mellitus with diabetic autonomic (poly)neuropathy: Secondary | ICD-10-CM

## 2016-10-25 DIAGNOSIS — K3184 Gastroparesis: Secondary | ICD-10-CM

## 2016-10-25 LAB — GLUCOSE, CAPILLARY
GLUCOSE-CAPILLARY: 194 mg/dL — AB (ref 65–99)
GLUCOSE-CAPILLARY: 257 mg/dL — AB (ref 65–99)
Glucose-Capillary: 297 mg/dL — ABNORMAL HIGH (ref 65–99)

## 2016-10-25 MED ORDER — FAMOTIDINE 20 MG PO TABS: 20.0000 mg | ORAL_TABLET | Freq: Two times a day (BID) | ORAL | 0 refills | Status: DC

## 2016-10-25 MED ORDER — DOXYCYCLINE HYCLATE 50 MG PO CAPS: 100.0000 mg | ORAL_CAPSULE | Freq: Two times a day (BID) | ORAL | 0 refills | Status: AC

## 2016-10-25 MED ORDER — INSULIN DETEMIR 100 UNIT/ML ~~LOC~~ SOLN: 100.0000 [IU] | Freq: Every day | SUBCUTANEOUS | 0 refills | Status: DC

## 2016-10-25 MED ORDER — METOCLOPRAMIDE HCL 5 MG PO TABS: 5.0000 mg | ORAL_TABLET | Freq: Three times a day (TID) | ORAL | 0 refills | Status: DC

## 2016-10-25 MED ORDER — CEFAZOLIN SODIUM-DEXTROSE 2-4 GM/100ML-% IV SOLN
2.0000 g | Freq: Three times a day (TID) | INTRAVENOUS | Status: DC
  Administered 2016-10-25: 2 g via INTRAVENOUS
  Filled 2016-10-25 (×3): qty 100

## 2016-10-25 MED ORDER — INSULIN ASPART 100 UNIT/ML ~~LOC~~ SOLN: 35.0000 [IU] | Freq: Two times a day (BID) | SUBCUTANEOUS | 0 refills | Status: DC

## 2016-10-25 MED ORDER — TERBINAFINE HCL 250 MG PO TABS: 250.0000 mg | ORAL_TABLET | Freq: Every day | ORAL | 0 refills | Status: DC

## 2016-10-25 MED ORDER — BLOOD GLUCOSE METER KIT: PACK | 0 refills | Status: AC

## 2016-10-25 NOTE — Progress Notes (Signed)
Occupational Therapy Treatment Patient Details Name: Roanne Haye MRN: [id number] DOB: 1981-09-10 Today's Date: 10/25/2016    History of present illness 35 y.o. female  who presents with R foot cellulitis.  Poorly controlled DM at baseline w/ presenting glucose >500 w/o ketoacidosis.   OT comments  Completed education regarding compensatory techniques for ADL, reducing risk of falls and educating pt on HEP. Pt ready for DC when medically stable.   Follow Up Recommendations  No OT follow up;Supervision - Intermittent    Equipment Recommendations  Tub/shower bench    Recommendations for Other Services      Precautions / Restrictions Precautions Precautions: Fall       Mobility Bed Mobility Overal bed mobility: Independent                Transfers Overall transfer level: Independent                    Balance Overall balance assessment: No apparent balance deficits (not formally assessed)                                 ADL                                         General ADL Comments: Pt issued reacher and long handled sponge. Educated on energy conservation. Educated on reducing risk of falls.  Continue to recommend tub bench for safety and to reduce risk of falls when bathing/transferring into tub.      Vision                     Perception     Praxis      Cognition   Behavior During Therapy: WFL for tasks assessed/performed Overall Cognitive Status: Within Functional Limits for tasks assessed                       Extremity/Trunk Assessment               Exercises Other Exercises Other Exercises: Educated on general theraband strengthening ex. Pt demosntrated understanding.   Shoulder Instructions       General Comments      Pertinent Vitals/ Pain       Pain Assessment: No/denies pain  Home Living                                          Prior  Functioning/Environment              Frequency  Min 2X/week        Progress Toward Goals  OT Goals(current goals can now be found in the care plan section)  Progress towards OT goals: Goals met/education completed, patient discharged from OT  Acute Rehab OT Goals Patient Stated Goal: to get better OT Goal Formulation: With patient Time For Goal Achievement: 11/06/16 Potential to Achieve Goals: Good ADL Goals Pt Will Perform Tub/Shower Transfer: with modified independence;tub bench Pt/caregiver will Perform Home Exercise Program: Increased strength;Both right and left upper extremity;With theraband;With written HEP provided Additional ADL Goal #1: Pt will verbalize 3 evergy conservation techniques  Plan Discharge plan remains appropriate    Co-evaluation  End of Session     Activity Tolerance Patient tolerated treatment well   Patient Left in bed;with call bell/phone within reach   Nurse Communication Mobility status        Time: 6767-2094 OT Time Calculation (min): 12 min  Charges: OT General Charges $OT Visit: 1 Procedure OT Treatments $Self Care/Home Management : 8-22 mins  Michaela Shankel,[name] 10/25/2016, 12:23 PM  [facility], OTR/L  [phone] 10/25/2016

## 2016-10-25 NOTE — Discharge Summary (Signed)
Physician Discharge Summary  Lindsay Love  XYV:[id number]  DOB: November 10, 1981  DOA: 10/22/2016 PCP: No PCP Per Patient  Admit date: 10/22/2016 Discharge date: 10/25/2016  Admitted From: Home   Disposition:  Home   Recommendations for Outpatient Follow-up:  1. Follow up with PCP in 1-2 weeks 2. Please obtain BMP/CBC in one week  Home Health: None  Equipment/Devices: None   Discharge Condition: Stable  CODE STATUS: Full Diet recommendation: Heart Healthy / Carb Modified   Brief/Interim Summary: Lindsay Lemonsis a 35 y.o.female with PMHx Asthma, DM type II IBS, with recent diagnosis of right foot cellulitis, treated with Bactrim and  Keflex as outpatient, failing its management, transferred from [place] at [facility] with worsening symptoms. She noted increased redness and swelling of the right food, with pain worse with movement. Was admitted for right foot and toe cellulitis, started on IV abx and fluids. Develop nausea and vomiting. Patient was recently nothing by mouth, Love Reglan and Pepcid. Subsequently improved and tolerated diet. Cellulitis of the right foot/toe has significantly improve blood cultures have been negative, orthopedic evaluation was done and recommended no surgery. Patient was noted to have onychomycosis. Patient will be discharged home with oral antibiotics, antifungal. Management for diabetes gastroparesis.  Subjective: Patient seen and examined this morning. She is hungry. Will like to take a shower since her menstrual period just started. She has no new complaints. Denies nausea and vomiting and abdominal pain.  Discharge Diagnoses:   Cellulitis of the right foot - failed OP therapy, Significant improvement  11/27 MRI of the right foot show diffuse cellulitis and myofascitis without discrete drainable abscess septic arthritis or osteomyelitis Right lower extremity venous Doppler negative Blood cultures negative UTD  Ortho recommendations appreciated  no surgical interventions at this time Will discharge on oral doxycycline for 7 days  Follow-up with PMD in 1 week  Type II Diabetes - CBGs stable, A1c 11.5 on 11/28 Continue insuline home regimen  Monitor Finger stick goal <140 fasting  Follow-up with PMD - recommend establish care at the [facility]  DM gastroparesis  Reglan 75m TID + QHS Pepcid 20 mg BID  Educated about small frequents meals   Asthma - stable Resume home medication   Onychomycosis - could be contributing to foot infection  LFTs normal  Terbinafine 250 mg daily, for 6 weeks Repeat LFTs in 4 weeks.  Discharge Instructions  Discharge Instructions    Call MD for:  difficulty breathing, headache or visual disturbances    Complete by:  As directed    Call MD for:  extreme fatigue    Complete by:  As directed    Call MD for:  hives    Complete by:  As directed    Call MD for:  persistant dizziness or light-headedness    Complete by:  As directed    Call MD for:  persistant nausea and vomiting    Complete by:  As directed    Call MD for:  redness, tenderness, or signs of infection (pain, swelling, redness, odor or green/yellow discharge around incision site)    Complete by:  As directed    Call MD for:  severe uncontrolled pain    Complete by:  As directed    Call MD for:  temperature >100.4    Complete by:  As directed    Diet - low sodium heart healthy    Complete by:  As directed    Increase activity slowly    Complete by:  As directed        Medication List    STOP taking these medications   cephALEXin 500 MG capsule Commonly known as:  KEFLEX   HYDROcodone-acetaminophen 5-325 MG tablet Commonly known as:  NORCO   metroNIDAZOLE 500 MG tablet Commonly known as:  FLAGYL   naphazoline-pheniramine 0.025-0.3 % ophthalmic solution Commonly known as:  NAPHCON-A   promethazine 25 MG tablet Commonly known as:  PHENERGAN   sulfamethoxazole-trimethoprim 800-160 MG tablet Commonly  known as:  BACTRIM DS,SEPTRA DS     TAKE these medications   blood glucose meter kit and supplies Dispense based on patient and insurance preference. Use up to four times daily as directed. (FOR ICD-9 250.00, 250.01).   doxycycline 50 MG capsule Commonly known as:  VIBRAMYCIN Take 2 capsules (100 mg total) by mouth 2 (two) times daily.   famotidine 20 MG tablet Commonly known as:  PEPCID Take 1 tablet (20 mg total) by mouth 2 (two) times daily.   insulin aspart 100 UNIT/ML injection Commonly known as:  NOVOLOG Inject 35 Units into the skin 2 (two) times daily before a meal. What changed:  how much to take  when to take this  additional instructions   insulin detemir 100 UNIT/ML injection Commonly known as:  LEVEMIR Inject 1 mL (100 Units total) into the skin at bedtime. What changed:  when to take this   metFORMIN 500 MG tablet Commonly known as:  GLUCOPHAGE Take 500 mg by mouth 2 (two) times daily with a meal.   metoCLOPramide 5 MG tablet Commonly known as:  REGLAN Take 1 tablet (5 mg total) by mouth 4 (four) times daily -  before meals and at bedtime.   ondansetron 4 MG tablet Commonly known as:  ZOFRAN Take 1 tablet (4 mg total) by mouth every 6 (six) hours as needed for nausea or vomiting.   terbinafine 250 MG tablet Commonly known as:  LAMISIL Take 1 tablet (250 mg total) by mouth daily.            Durable Medical Equipment        Start     Ordered   10/25/16 1230  For home use only DME Tub bench  Once    Comments:  10/25/2016 Pt for d/c to home today.   10/25/16 1230     Follow-up Information    Lindsay Minion, MD Follow up in 2 week(s).   Specialty:  Orthopedic Surgery Contact information: [place] [address] [place]. Schedule an appointment as soon as possible for a visit in 1 week(s).   Contact information: [address] [place]  [id number] [phone]         No Known Allergies  Consultations:  Orthopedics - Lindsay Love    Procedures/Studies: Mr Foot Right Wo Contrast  Result Date: 10/23/2016 CLINICAL DATA:  Cellulitis with fairly air of treatment. Persistent redness and swelling involving the foot. EXAM: MRI OF THE RIGHT FOREFOOT WITHOUT CONTRAST TECHNIQUE: Multiplanar, multisequence MR imaging of the right foot was performed. No intravenous contrast was administered. COMPARISON:  Right ankle radiographs 11/22/2015 FINDINGS: Diffuse subcutaneous soft tissue swelling/ edema/ fluid involving the entire foot and ankle. There is also diffuse edema like signal abnormality involving the foot musculature consistent with myofasciitis. No discrete drainable fluid collection to suggest an abscess. No findings suspicious for septic arthritis or osteomyelitis. The major tendons and ligaments are  intact. IMPRESSION: Diffuse cellulitis and myofasciitis without discrete drainable abscess, septic arthritis or osteomyelitis. Electronically Signed   By: Marijo Sanes M.D.   On: 10/23/2016 08:33     Discharge Exam: Vitals:   10/25/16 0432 10/25/16 0920  BP: (!) 154/80 (!) 164/78  Pulse: 88 84  Resp: 18 18  Temp: 99.1 F (37.3 C) 98.4 F (36.9 C)   Vitals:   10/24/16 1729 10/24/16 2050 10/25/16 0432 10/25/16 0920  BP: (!) 145/82 (!) 167/61 (!) 154/80 (!) 164/78  Pulse: 92 86 88 84  Resp: '17 18 18 18  ' Temp: 98.9 F (37.2 C) 98.5 F (36.9 C) 99.1 F (37.3 C) 98.4 F (36.9 C)  TempSrc: Oral Oral Oral Oral  SpO2: 96% 92% 96% 96%  Weight:      Height:        General: Pt is alert, awake, not in acute distress Cardiovascular: RRR, S1/S2 +, no rubs, no gallops Respiratory: CTA bilaterally, no wheezing, no rhonchi Abdominal: Soft, NT, ND, bowel sounds + Extremities: Mild erythema in the right great toe. Swelling improving. Blister between first and second toe nonerythematous. Yellow thickening of all toenails.   The  results of significant diagnostics from this hospitalization (including imaging, microbiology, ancillary and laboratory) are listed below for reference.     Microbiology: Recent Results (from the past 240 hour(s))  Urine culture     Status: None   Collection Time: 10/22/16  1:13 AM  Result Value Ref Range Status   Specimen Description URINE, RANDOM  Final   Special Requests NONE  Final   Culture NO GROWTH Performed at [facility]   Final   Report Status 10/23/2016 FINAL  Final  Blood Culture (routine x 2)     Status: None (Preliminary result)   Collection Time: 10/22/16  1:40 AM  Result Value Ref Range Status   Specimen Description BLOOD LEFT FOREARM  Final   Special Requests BOTTLES DRAWN AEROBIC AND ANAEROBIC 5CC  Final   Culture   Final    NO GROWTH 2 DAYS Performed at [facility]    Report Status PENDING  Incomplete  Blood Culture (routine x 2)     Status: None (Preliminary result)   Collection Time: 10/22/16  1:40 AM  Result Value Ref Range Status   Specimen Description BLOOD RIGHT FOREARM  Final   Special Requests BOTTLES DRAWN AEROBIC AND ANAEROBIC 5CC  Final   Culture   Final    NO GROWTH 2 DAYS Performed at [facility]    Report Status PENDING  Incomplete     Labs: BNP (last 3 results) No results for input(s): BNP in the last 8760 hours. Basic Metabolic Panel:  Recent Labs Lab 10/22/16 0139 10/23/16 0448 10/24/16 0639  NA 129* 132* 136  K 4.3 3.7 3.8  CL 94* 100* 103  CO2 '24 27 27  ' GLUCOSE 537* 376* 248*  BUN '15 6 6  ' CREATININE 0.67 0.66 0.80  CALCIUM 9.8 8.5* 8.9   Liver Function Tests:  Recent Labs Lab 10/22/16 0139 10/23/16 0448  AST 21 17  ALT 22 19  ALKPHOS 81 66  BILITOT 0.5 0.7  PROT 8.6* 5.5*  ALBUMIN 3.7 2.4*   No results for input(s): LIPASE, AMYLASE in the last 168 hours. No results for input(s): AMMONIA in the last 168 hours. CBC:  Recent Labs Lab 10/22/16 0139 10/23/16 0448 10/24/16 0639  WBC  11.0* 8.3 8.2  NEUTROABS 7.0  --   --  HGB 14.6 11.1* 12.3  HCT 41.7 32.9* 35.9*  MCV 82.6 83.7 84.1  PLT 484* 365 401*   Cardiac Enzymes: No results for input(s): CKTOTAL, CKMB, CKMBINDEX, TROPONINI in the last 168 hours. BNP: Invalid input(s): POCBNP CBG:  Recent Labs Lab 10/24/16 2019 10/25/16 0000 10/25/16 0409 10/25/16 0724 10/25/16 1154  GLUCAP 217* 181* 194* 257* 297*   D-Dimer No results for input(s): DDIMER in the last 72 hours. Hgb A1c No results for input(s): HGBA1C in the last 72 hours. Lipid Profile No results for input(s): CHOL, HDL, LDLCALC, TRIG, CHOLHDL, LDLDIRECT in the last 72 hours. Thyroid function studies No results for input(s): TSH, T4TOTAL, T3FREE, THYROIDAB in the last 72 hours.  Invalid input(s): FREET3 Anemia work up No results for input(s): VITAMINB12, FOLATE, FERRITIN, TIBC, IRON, RETICCTPCT in the last 72 hours. Urinalysis    Component Value Date/Time   COLORURINE YELLOW 10/22/2016 0113   APPEARANCEUR CLEAR 10/22/2016 0113   LABSPEC 1.042 (H) 10/22/2016 0113   PHURINE 5.5 10/22/2016 0113   GLUCOSEU >1000 (A) 10/22/2016 0113   HGBUR MODERATE (A) 10/22/2016 0113   BILIRUBINUR NEGATIVE 10/22/2016 0113   KETONESUR NEGATIVE 10/22/2016 0113   PROTEINUR NEGATIVE 10/22/2016 0113   UROBILINOGEN 0.2 07/11/2014 1555   NITRITE NEGATIVE 10/22/2016 0113   LEUKOCYTESUR NEGATIVE 10/22/2016 0113   Sepsis Labs Invalid input(s): PROCALCITONIN,  WBC,  LACTICIDVEN Microbiology Recent Results (from the past 240 hour(s))  Urine culture     Status: None   Collection Time: 10/22/16  1:13 AM  Result Value Ref Range Status   Specimen Description URINE, RANDOM  Final   Special Requests NONE  Final   Culture NO GROWTH Performed at [facility]   Final   Report Status 10/23/2016 FINAL  Final  Blood Culture (routine x 2)     Status: None (Preliminary result)   Collection Time: 10/22/16  1:40 AM  Result Value Ref Range Status   Specimen  Description BLOOD LEFT FOREARM  Final   Special Requests BOTTLES DRAWN AEROBIC AND ANAEROBIC 5CC  Final   Culture   Final    NO GROWTH 2 DAYS Performed at [facility]    Report Status PENDING  Incomplete  Blood Culture (routine x 2)     Status: None (Preliminary result)   Collection Time: 10/22/16  1:40 AM  Result Value Ref Range Status   Specimen Description BLOOD RIGHT FOREARM  Final   Special Requests BOTTLES DRAWN AEROBIC AND ANAEROBIC 5CC  Final   Culture   Final    NO GROWTH 2 DAYS Performed at [facility]    Report Status PENDING  Incomplete    Time coordinating discharge: Over 30 minutes  SIGNED:  Chipper Oman, MD  [facility] 10/25/2016, 2:28 PM Pager   If 7PM-7AM, please contact night-coverage [email or web address] Password [email or web address]

## 2016-10-25 NOTE — Progress Notes (Signed)
Patient discharge teaching given, including activity, diet, follow-up appoints, and medications. Patient verbalized understanding of all discharge instructions. IV access was d/c'd. Vitals are stable. Skin is intact except as charted in most recent assessments. Pt to be escorted out by NT, to be driven home by family.  Ashle Stief, MBA, BSN, RN 

## 2016-10-25 NOTE — Care Management Note (Signed)
Case Management Note  Patient Details  Name: Hedy CamaraBrandena Val MRN: [id number] Date of Birth: 04/23/1981  Subjective/Objective:        CM following for progression and d/c planning.             Action/Plan: 10/25/2016 Pt OT eval pt needs a tub bench, order place and [facility] notified of plan to d/c today and need for tub bench. [facility] will deliver to room. No other HH needs identified at this time.   Expected Discharge Date:      10/25/2016            Expected Discharge Plan:  Home/Self Care  In-House Referral:  NA  Discharge planning Services  CM Consult  Post Acute Care Choice:  Home Health, Durable Medical Equipment Choice offered to:  Patient  DME Arranged:  Tub bench DME Agency:  [facility]  HH Arranged:   NA HH Agency:   NA  Status of Service:  Completed, signed off  If discussed at Long Length of Stay Meetings, dates discussed:    Additional Comments:  Starlyn SkeansRoyal, Althia Egolf U, RN 10/25/2016, 12:35 PM

## 2016-10-25 NOTE — Progress Notes (Signed)
Inpatient Diabetes Program Recommendations  AACE/ADA: New Consensus Statement on Inpatient Glycemic Control (2015)  Target Ranges:  Prepandial:   less than 140 mg/dL      Peak postprandial:   less than 180 mg/dL (1-2 hours)      Critically ill patients:  140 - 180 mg/dL   Results for Lindsay Love, Lindsay Love (MRN [id number]) as of 10/25/2016 09:57  Ref. Range 10/24/2016 06:59 10/24/2016 11:48 10/24/2016 16:25 10/24/2016 20:19 10/25/2016 00:00 10/25/2016 04:09 10/25/2016 07:24  Glucose-Capillary Latest Ref Range: 65 - 99 mg/dL 782225 (H) 956181 (H) 213217 (H) 217 (H) 181 (H) 194 (H) 257 (H)   Review of Glycemic Control  Diabetes history: DM 2 Outpatient Diabetes medications: Levemir 100 units, Novolog 35-40 units TID Current orders for Inpatient glycemic control: Levemir 50 units BID, Novolog Moderate correction TID + Novolog 20 units TID meal coverage  Inpatient Diabetes Program Recommendations:   Glucose still elevated mostly in the 200's. Consider increasing Levemir to 52-54 units BID.  Thanks,  Christena DeemShannon Boe Deans RN, MSN, [facility] Inpatient Diabetes Coordinator Team Pager [phone] (8a-5p)

## 2016-10-27 LAB — CULTURE, BLOOD (ROUTINE X 2)
CULTURE: NO GROWTH
CULTURE: NO GROWTH

## 2017-03-05 ENCOUNTER — Encounter (HOSPITAL_BASED_OUTPATIENT_CLINIC_OR_DEPARTMENT_OTHER): Payer: Self-pay | Admitting: *Deleted

## 2017-03-05 ENCOUNTER — Emergency Department (HOSPITAL_BASED_OUTPATIENT_CLINIC_OR_DEPARTMENT_OTHER)
Admission: EM | Admit: 2017-03-05 | Discharge: 2017-03-05 | Disposition: A | Discharge status: Home / Self Care | Payer: Medicaid Other | Attending: Emergency Medicine | Admitting: Emergency Medicine

## 2017-03-05 ENCOUNTER — Emergency Department (HOSPITAL_BASED_OUTPATIENT_CLINIC_OR_DEPARTMENT_OTHER): Payer: Medicaid Other

## 2017-03-05 DIAGNOSIS — E119 Type 2 diabetes mellitus without complications: Secondary | ICD-10-CM | POA: Insufficient documentation

## 2017-03-05 DIAGNOSIS — J45909 Unspecified asthma, uncomplicated: Secondary | ICD-10-CM | POA: Diagnosis not present

## 2017-03-05 DIAGNOSIS — R002 Palpitations: Secondary | ICD-10-CM | POA: Insufficient documentation

## 2017-03-05 DIAGNOSIS — Z794 Long term (current) use of insulin: Secondary | ICD-10-CM | POA: Insufficient documentation

## 2017-03-05 DIAGNOSIS — M7989 Other specified soft tissue disorders: Secondary | ICD-10-CM | POA: Diagnosis not present

## 2017-03-05 DIAGNOSIS — R0602 Shortness of breath: Secondary | ICD-10-CM | POA: Insufficient documentation

## 2017-03-05 DIAGNOSIS — Z79899 Other long term (current) drug therapy: Secondary | ICD-10-CM | POA: Insufficient documentation

## 2017-03-05 DIAGNOSIS — R0600 Dyspnea, unspecified: Secondary | ICD-10-CM

## 2017-03-05 LAB — RAPID URINE DRUG SCREEN, HOSP PERFORMED
AMPHETAMINES: NOT DETECTED
Barbiturates: NOT DETECTED
Benzodiazepines: NOT DETECTED
Cocaine: NOT DETECTED
OPIATES: NOT DETECTED
Tetrahydrocannabinol: NOT DETECTED

## 2017-03-05 LAB — TROPONIN I

## 2017-03-05 LAB — BASIC METABOLIC PANEL
ANION GAP: 8 (ref 5–15)
BUN: 12 mg/dL (ref 6–20)
CALCIUM: 9.5 mg/dL (ref 8.9–10.3)
CO2: 25 mmol/L (ref 22–32)
CREATININE: 0.69 mg/dL (ref 0.44–1.00)
Chloride: 103 mmol/L (ref 101–111)
GFR calc Af Amer: >60 mL/min (ref >60)
GLUCOSE: 204 mg/dL — AB (ref 65–99)
Potassium: 3.1 mmol/L — ABNORMAL LOW (ref 3.5–5.1)
Sodium: 136 mmol/L (ref 135–145)

## 2017-03-05 LAB — URINALYSIS, ROUTINE W REFLEX MICROSCOPIC
BILIRUBIN URINE: NEGATIVE
KETONES UR: 15 mg/dL — AB
Leukocytes, UA: NEGATIVE
Nitrite: NEGATIVE
PH: 6 (ref 5.0–8.0)
PROTEIN: 100 mg/dL — AB
Specific Gravity, Urine: 1.031 — ABNORMAL HIGH (ref 1.005–1.030)

## 2017-03-05 LAB — URINALYSIS, MICROSCOPIC (REFLEX)

## 2017-03-05 LAB — CBC
HCT: 39.6 % (ref 36.0–46.0)
HEMOGLOBIN: 14.1 g/dL (ref 12.0–15.0)
MCH: 29.3 pg (ref 26.0–34.0)
MCHC: 35.6 g/dL (ref 30.0–36.0)
MCV: 82.3 fL (ref 78.0–100.0)
PLATELETS: 466 10*3/uL — AB (ref 150–400)
RBC: 4.81 MIL/uL (ref 3.87–5.11)
RDW: 13.4 % (ref 11.5–15.5)
WBC: 9.4 10*3/uL (ref 4.0–10.5)

## 2017-03-05 LAB — PREGNANCY, URINE: Preg Test, Ur: NEGATIVE

## 2017-03-05 LAB — D-DIMER, QUANTITATIVE (NOT AT ARMC): D DIMER QUANT: 0.34 ug{FEU}/mL (ref 0.00–0.50)

## 2017-03-05 MED ORDER — SODIUM CHLORIDE 0.9 % IV BOLUS (SEPSIS)
500.0000 mL | Freq: Once | INTRAVENOUS | Status: AC
  Administered 2017-03-05: 500 mL via INTRAVENOUS

## 2017-03-05 NOTE — ED Notes (Signed)
ED Provider at bedside. 

## 2017-03-05 NOTE — ED Provider Notes (Signed)
[facility] DEPT [facility] Provider Note   CSN: [id number] Arrival date & time: 03/05/17  2012  By signing my name below, I, Hansel Feinstein, attest that this documentation has been prepared under the direction and in the presence of Davonna Belling, MD. Electronically Signed: Hansel Feinstein, ED Scribe. 03/05/17. 9:44 PM.     History   Chief Complaint Chief Complaint  Patient presents with  . Palpitations  . Shortness of Breath    HPI Lindsay Love is a 36 y.o. female who presents to the Emergency Department complaining of intermittent palpitations that began at 4:30 am.  Pt states she woke up hot and diaphoretic with palpitations described as her "heart beating fast". She notes this slightly improved as the day went on but she then began to have SOB. No h/o of similar symptoms. Pt states she was slightly stressed yesterday. She also notes a recent dry cough and left leg swelling. No h/o thyroid disease. She denies fever, nausea, vomiting, diarrhea.   The history is provided by the patient. No language interpreter was used.    Past Medical History:  Diagnosis Date  . Asthma   . Cellulitis of left foot   . Diabetes mellitus without complication ([place])   . IBS (irritable bowel syndrome)     Patient Active Problem List   Diagnosis Date Noted  . Cellulitis of right foot 10/22/2016  . Diabetes ([place]) 10/22/2016  . IBS (irritable bowel syndrome) 10/22/2016  . Asthma 10/22/2016  . Right leg pain 10/22/2016  . Cellulitis and abscess of toe of right foot   . Diabetic peripheral neuropathy ([place])   . Failure of outpatient treatment     Past Surgical History:  Procedure Laterality Date  . CESAREAN SECTION    . CHOLECYSTECTOMY    . CYST EXCISION    . TONSILLECTOMY    . TOOTH EXTRACTION      OB History    No data available       Home Medications    Prior to Admission medications   Medication Sig Start Date End Date Taking? Authorizing Provider  blood glucose meter kit and  supplies Dispense based on patient and insurance preference. Use up to four times daily as directed. (FOR ICD-9 250.00, 250.01). 10/25/16  Yes Doreatha Lew, MD  insulin aspart (NOVOLOG) 100 UNIT/ML injection Inject 35 Units into the skin 2 (two) times daily before a meal. 10/25/16  Yes Doreatha Lew, MD  insulin detemir (LEVEMIR) 100 UNIT/ML injection Inject 1 mL (100 Units total) into the skin at bedtime. 10/25/16  Yes Doreatha Lew, MD  metFORMIN (GLUCOPHAGE) 500 MG tablet Take 500 mg by mouth 2 (two) times daily with a meal.   Yes Historical Provider, MD  famotidine (PEPCID) 20 MG tablet Take 1 tablet (20 mg total) by mouth 2 (two) times daily. 10/25/16   Doreatha Lew, MD  metoCLOPramide (REGLAN) 5 MG tablet Take 1 tablet (5 mg total) by mouth 4 (four) times daily -  before meals and at bedtime. 10/25/16   Doreatha Lew, MD  ondansetron (ZOFRAN) 4 MG tablet Take 1 tablet (4 mg total) by mouth every 6 (six) hours as needed for nausea or vomiting. 09/11/15   Forde Dandy, MD  terbinafine (LAMISIL) 250 MG tablet Take 1 tablet (250 mg total) by mouth daily. 10/25/16   Doreatha Lew, MD    Family History Family History  Problem Relation Age of Onset  . Diabetes Mother   . Diabetes Father  Social History Social History  Substance Use Topics  . Smoking status: Never Smoker  . Smokeless tobacco: Never Used  . Alcohol use No     Allergies   Patient has no known allergies.   Review of Systems Review of Systems  Constitutional: Positive for diaphoresis. Negative for fever.  Respiratory: Positive for shortness of breath.   Cardiovascular: Positive for palpitations and leg swelling.  Gastrointestinal: Negative for diarrhea, nausea and vomiting.     Physical Exam Updated Vital Signs BP (!) 137/98   Pulse 96   Temp 98 F (36.7 C) (Oral)   Resp 15   Ht '5\' 7"'  (1.702 m)   Wt 230 lb (104.3 kg)   LMP 02/26/2017   SpO2 98%   BMI 36.02 kg/m    Physical Exam  Constitutional: She appears well-developed and well-nourished.  HENT:  Head: Normocephalic.  Eyes: Conjunctivae are normal.  Neck: No thyromegaly present.  Cardiovascular: Regular rhythm.  Tachycardia present.   Pulmonary/Chest: Effort normal and breath sounds normal. No respiratory distress. She has no wheezes. She has no rales.  Abdominal: She exhibits no distension.  Musculoskeletal: Normal range of motion. She exhibits edema and tenderness.  Mild edema of left lower leg with mild tenderness over left calf  Neurological: She is alert.  Skin: Skin is warm and dry.  Psychiatric: She has a normal mood and affect. Her behavior is normal.  Nursing note and vitals reviewed.    ED Treatments / Results   DIAGNOSTIC STUDIES: Oxygen Saturation is 100% on RA, normal by my interpretation.    COORDINATION OF CARE: 9:33 PM Discussed treatment plan with pt at bedside which includes labs, CXR and pt agreed to plan.    Labs (all labs ordered are listed, but only abnormal results are displayed) Labs Reviewed  BASIC METABOLIC PANEL - Abnormal; Notable for the following:       Result Value   Potassium 3.1 (*)    Glucose, Bld 204 (*)    All other components within normal limits  CBC - Abnormal; Notable for the following:    Platelets 466 (*)    All other components within normal limits  URINALYSIS, ROUTINE W REFLEX MICROSCOPIC - Abnormal; Notable for the following:    Specific Gravity, Urine 1.031 (*)    Glucose, UA >=500 (*)    Hgb urine dipstick MODERATE (*)    Ketones, ur 15 (*)    Protein, ur 100 (*)    All other components within normal limits  URINALYSIS, MICROSCOPIC (REFLEX) - Abnormal; Notable for the following:    Bacteria, UA FEW (*)    Squamous Epithelial / LPF 0-5 (*)    All other components within normal limits  TROPONIN I  D-DIMER, QUANTITATIVE (NOT AT [facility])  RAPID URINE DRUG SCREEN, HOSP PERFORMED  PREGNANCY, URINE  TSH    EKG  EKG  Interpretation  Date/Time:  Tuesday March 05 2017 20:17:38 EDT Ventricular Rate:  118 PR Interval:  162 QRS Duration: 72 QT Interval:  302 QTC Calculation: 423 R Axis:   71 Text Interpretation:  Sinus tachycardia Abnormal ECG Confirmed by Alvino Chapel  MD, Ovid Curd [phone]) on 03/05/2017 9:35:24 PM       Radiology Dg Chest 2 View  Result Date: 03/05/2017 CLINICAL DATA:  Cough for several days.  Shortness of breath today. EXAM: CHEST  2 VIEW COMPARISON:  PA and lateral chest 09/11/2015 and 03/02/2014. FINDINGS: The lungs are clear. Heart size is normal. No pneumothorax or pleural effusion. No bony  abnormality. IMPRESSION: Normal chest. Electronically Signed   By: Inge Rise M.D.   On: 03/05/2017 21:11    Procedures Procedures (including critical care time)  Medications Ordered in ED Medications  sodium chloride 0.9 % bolus 500 mL (0 mLs Intravenous Stopped 03/05/17 2300)     Initial Impression / Assessment and Plan / ED Course  I have reviewed the triage vital signs and the nursing notes.  Pertinent labs & imaging results that were available during my care of the patient were reviewed by me and considered in my medical decision making (see chart for details).     Patient with palpitations and shortness of breath. Has reportedly had some tachycardia in the past. Lungs are clear. Slight swelling of left calf. Negative d-dimer rules out PE and someone with this risk factors. Heart rate improved with IV fluids. Will discharge home.  Final Clinical Impressions(s) / ED Diagnoses   Final diagnoses:  Palpitations  Dyspnea, unspecified type    New Prescriptions New Prescriptions   No medications on file    I personally performed the services described in this documentation, which was scribed in my presence. The recorded information has been reviewed and is accurate.      Davonna Belling, MD 03/05/17 [phone]

## 2017-03-05 NOTE — ED Triage Notes (Addendum)
She woke at 4:30 am with palpitations and diaphoresis. States she has had symptoms on and off all day. She has no hx of asthma but has been given an inhaler in the past for sob. Lungs are clear. She is ambulatory to triage in no distress.

## 2017-03-05 NOTE — Discharge Instructions (Signed)
Follow-up with your doctor as needed for further management.

## 2017-03-06 LAB — TSH: TSH: 1.763 u[IU]/mL (ref 0.350–4.500)

## 2017-05-03 ENCOUNTER — Encounter (HOSPITAL_BASED_OUTPATIENT_CLINIC_OR_DEPARTMENT_OTHER): Payer: Self-pay

## 2017-05-03 ENCOUNTER — Emergency Department (HOSPITAL_BASED_OUTPATIENT_CLINIC_OR_DEPARTMENT_OTHER)
Admission: EM | Admit: 2017-05-03 | Discharge: 2017-05-03 | Disposition: A | Discharge status: Home / Self Care | Payer: Medicaid Other | Attending: Emergency Medicine | Admitting: Emergency Medicine

## 2017-05-03 DIAGNOSIS — J45909 Unspecified asthma, uncomplicated: Secondary | ICD-10-CM | POA: Diagnosis not present

## 2017-05-03 DIAGNOSIS — Z79899 Other long term (current) drug therapy: Secondary | ICD-10-CM | POA: Diagnosis not present

## 2017-05-03 DIAGNOSIS — Z794 Long term (current) use of insulin: Secondary | ICD-10-CM | POA: Insufficient documentation

## 2017-05-03 DIAGNOSIS — E119 Type 2 diabetes mellitus without complications: Secondary | ICD-10-CM | POA: Diagnosis not present

## 2017-05-03 DIAGNOSIS — N390 Urinary tract infection, site not specified: Secondary | ICD-10-CM | POA: Insufficient documentation

## 2017-05-03 DIAGNOSIS — R300 Dysuria: Secondary | ICD-10-CM | POA: Diagnosis present

## 2017-05-03 LAB — URINALYSIS, MICROSCOPIC (REFLEX)

## 2017-05-03 LAB — URINALYSIS, ROUTINE W REFLEX MICROSCOPIC
Bilirubin Urine: NEGATIVE
Ketones, ur: NEGATIVE mg/dL
Nitrite: NEGATIVE
PROTEIN: 30 mg/dL — AB
SPECIFIC GRAVITY, URINE: 1.011 (ref 1.005–1.030)
pH: 5.5 (ref 5.0–8.0)

## 2017-05-03 LAB — PREGNANCY, URINE: Preg Test, Ur: NEGATIVE

## 2017-05-03 MED ORDER — ONDANSETRON HCL 4 MG PO TABS: 4.0000 mg | ORAL_TABLET | Freq: Four times a day (QID) | ORAL | 0 refills | Status: DC

## 2017-05-03 MED ORDER — CEPHALEXIN 500 MG PO CAPS: 500.0000 mg | ORAL_CAPSULE | Freq: Three times a day (TID) | ORAL | 0 refills | Status: DC

## 2017-05-03 MED ORDER — CEPHALEXIN 250 MG PO CAPS
500.0000 mg | ORAL_CAPSULE | Freq: Once | ORAL | Status: AC
  Administered 2017-05-03: 500 mg via ORAL
  Filled 2017-05-03: qty 2

## 2017-05-03 MED ORDER — PHENAZOPYRIDINE HCL 100 MG PO TABS
95.0000 mg | ORAL_TABLET | Freq: Once | ORAL | Status: AC
  Administered 2017-05-03: 100 mg via ORAL
  Filled 2017-05-03: qty 1

## 2017-05-03 NOTE — ED Provider Notes (Signed)
[facility] DEPT [facility] Provider Note   CSN: [id number] Arrival date & time: 05/03/17  0046     History   Chief Complaint Chief Complaint  Patient presents with  . Dysuria    HPI Lindsay Love is a 36 y.o. female.  HPI  Pt presenting with c/o dysuria. She states that for the past couple of days she has had burning with urination as well as urinary frequency and urgency.  No fever/chills. No abdominal or pelvic pain. She has had some nausea and had one episode of emesis this morning but has been drinking liquids well since then.  She states it has been over 6 months since her last episode of UTI.  No recent antibiotics.  There are no other associated systemic symptoms, there are no other alleviating or modifying factors.   Past Medical History:  Diagnosis Date  . Asthma   . Cellulitis of left foot   . Diabetes mellitus without complication ([place])   . IBS (irritable bowel syndrome)     Patient Active Problem List   Diagnosis Date Noted  . Cellulitis of right foot 10/22/2016  . Diabetes ([place]) 10/22/2016  . IBS (irritable bowel syndrome) 10/22/2016  . Asthma 10/22/2016  . Right leg pain 10/22/2016  . Cellulitis and abscess of toe of right foot   . Diabetic peripheral neuropathy ([place])   . Failure of outpatient treatment     Past Surgical History:  Procedure Laterality Date  . CESAREAN SECTION    . CHOLECYSTECTOMY    . CYST EXCISION    . TONSILLECTOMY    . TOOTH EXTRACTION      OB History    No data available       Home Medications    Prior to Admission medications   Medication Sig Start Date End Date Taking? Authorizing Provider  blood glucose meter kit and supplies Dispense based on patient and insurance preference. Use up to four times daily as directed. (FOR ICD-9 250.00, 250.01). 10/25/16   Patrecia Pour, Christean Grief, MD  cephALEXin (KEFLEX) 500 MG capsule Take 1 capsule (500 mg total) by mouth 3 (three) times daily. 05/03/17   Alfonzo Beers, MD  famotidine  (PEPCID) 20 MG tablet Take 1 tablet (20 mg total) by mouth 2 (two) times daily. 10/25/16   Doreatha Lew, MD  insulin aspart (NOVOLOG) 100 UNIT/ML injection Inject 35 Units into the skin 2 (two) times daily before a meal. 10/25/16   Patrecia Pour, Christean Grief, MD  insulin detemir (LEVEMIR) 100 UNIT/ML injection Inject 1 mL (100 Units total) into the skin at bedtime. 10/25/16   Doreatha Lew, MD  metFORMIN (GLUCOPHAGE) 500 MG tablet Take 500 mg by mouth 2 (two) times daily with a meal.    [provider]  metoCLOPramide (REGLAN) 5 MG tablet Take 1 tablet (5 mg total) by mouth 4 (four) times daily -  before meals and at bedtime. 10/25/16   Doreatha Lew, MD  ondansetron (ZOFRAN) 4 MG tablet Take 1 tablet (4 mg total) by mouth every 6 (six) hours. 05/03/17   Alfonzo Beers, MD  terbinafine (LAMISIL) 250 MG tablet Take 1 tablet (250 mg total) by mouth daily. 10/25/16   Doreatha Lew, MD    Family History Family History  Problem Relation Age of Onset  . Diabetes Mother   . Diabetes Father     Social History Social History  Substance Use Topics  . Smoking status: Never Smoker  . Smokeless tobacco: Never Used  . Alcohol  use No     Allergies   Patient has no known allergies.   Review of Systems Review of Systems  ROS reviewed and all otherwise negative except for mentioned in HPI   Physical Exam Updated Vital Signs BP (!) 152/94 (BP Location: Left Arm)   Pulse (!) 101   Temp 98 F (36.7 C) (Oral)   Resp 18   Ht '5\' 7"'  (1.702 m)   Wt 96.2 kg (212 lb)   LMP 04/25/2017   SpO2 100%   BMI 33.20 kg/m  Vitals reviewed Physical Exam Physical Examination: General appearance - alert, well appearing, and in no distress Mental status - alert, oriented to person, place, and time Eyes - no conjunctival injection, no scleral icterus Chest - clear to auscultation, no wheezes, rales or rhonchi, symmetric air entry Heart - normal rate, regular rhythm, normal S1, S2,  no murmurs, rubs, clicks or gallops Abdomen - soft, mild suprapubic tenderness to palpation, no gaurding or rebound tenderness, nondistended, no masses or organomegaly Neurological - alert, oriented, normal speech Extremities - peripheral pulses normal, no pedal edema, no clubbing or cyanosis Skin - normal coloration and turgor, no rashes  ED Treatments / Results  Labs (all labs ordered are listed, but only abnormal results are displayed) Labs Reviewed  URINALYSIS, ROUTINE W REFLEX MICROSCOPIC - Abnormal; Notable for the following:       Result Value   APPearance CLOUDY (*)    Glucose, UA >=500 (*)    Hgb urine dipstick MODERATE (*)    Protein, ur 30 (*)    Leukocytes, UA LARGE (*)    All other components within normal limits  URINALYSIS, MICROSCOPIC (REFLEX) - Abnormal; Notable for the following:    Bacteria, UA MANY (*)    Squamous Epithelial / LPF 6-30 (*)    All other components within normal limits  URINE CULTURE  PREGNANCY, URINE    EKG  EKG Interpretation None       Radiology No results found.  Procedures Procedures (including critical care time)  Medications Ordered in ED Medications  cephALEXin (KEFLEX) capsule 500 mg (500 mg Oral Given 05/03/17 0245)  phenazopyridine (PYRIDIUM) tablet 100 mg (100 mg Oral Given 05/03/17 0245)     Initial Impression / Assessment and Plan / ED Course  I have reviewed the triage vital signs and the nursing notes.  Pertinent labs & imaging results that were available during my care of the patient were reviewed by me and considered in my medical decision making (see chart for details).     Pt presenting with symptoms of dysuria, urgency and frequency with suprapubic discomfort. Her urine is c/w UTI.  Pt treated with keflex, given first dose in the ED. Also recommended pyridium to help with symptoms.  She is able to tolerate po fluids.  Nontoxic appearing.  Discharged with strict return precautions.  Pt agreeable with  plan.  Final Clinical Impressions(s) / ED Diagnoses   Final diagnoses:  Lower urinary tract infectious disease    New Prescriptions Discharge Medication List as of 05/03/2017  2:38 AM    START taking these medications   Details  cephALEXin (KEFLEX) 500 MG capsule Take 1 capsule (500 mg total) by mouth 3 (three) times daily., Starting Fri 05/03/2017, Print         Alfonzo Beers, MD 05/03/17 [phone]

## 2017-05-03 NOTE — ED Triage Notes (Signed)
Pt c/o painful urination for two days, no fevers at home, no new back pain

## 2017-05-03 NOTE — ED Notes (Signed)
Pt verbalizes understanding of d/c instructions and denies any further needs at this time. 

## 2017-05-03 NOTE — Discharge Instructions (Signed)
Return to the ED with any concerns including vomiting and not able to keep down liquids or your medications, abdominal pain especially if it localizes to the right lower abdomen, fever or chills, and decreased urine output, decreased level of alertness or lethargy, or any other alarming symptoms.  °

## 2017-05-05 LAB — URINE CULTURE

## 2017-05-06 ENCOUNTER — Telehealth (HOSPITAL_BASED_OUTPATIENT_CLINIC_OR_DEPARTMENT_OTHER): Payer: Self-pay | Admitting: Emergency Medicine

## 2017-05-06 NOTE — Telephone Encounter (Signed)
Post ED Visit - Positive Culture Follow-up  Culture report reviewed by antimicrobial stewardship pharmacist:  []  Enzo BiNathan Batchelder, Pharm.D. []  Celedonio MiyamotoJeremy Frens, Pharm.D., BCPS AQ-ID []  Garvin FilaMike Maccia, Pharm.D., BCPS [x]  Georgina PillionElizabeth Martin, Pharm.D., BCPS []  FairviewMinh Pham, 17[address] Rainbow BoulevardPharm.D., BCPS, AAHIVP []  Estella HuskMichelle Turner, Pharm.D., BCPS, AAHIVP []  Lysle Pearlachel Rumbarger, PharmD, BCPS []  Casilda Carlsaylor Stone, PharmD, BCPS []  Pollyann SamplesAndy Johnston, PharmD, BCPS  Positive urine culture Treated with cephalexin, organism sensitive to the same and no further patient follow-up is required at this time.  Berle MullMiller, Marieann Zipp 05/06/2017, 12:06 PM

## 2017-05-24 ENCOUNTER — Encounter (HOSPITAL_BASED_OUTPATIENT_CLINIC_OR_DEPARTMENT_OTHER): Payer: Self-pay | Admitting: Emergency Medicine

## 2017-05-24 ENCOUNTER — Emergency Department (HOSPITAL_BASED_OUTPATIENT_CLINIC_OR_DEPARTMENT_OTHER)
Admission: EM | Admit: 2017-05-24 | Discharge: 2017-05-25 | Disposition: A | Discharge status: Home / Self Care | Payer: Medicaid Other | Attending: Emergency Medicine | Admitting: Emergency Medicine

## 2017-05-24 DIAGNOSIS — M79671 Pain in right foot: Secondary | ICD-10-CM | POA: Diagnosis present

## 2017-05-24 DIAGNOSIS — Z794 Long term (current) use of insulin: Secondary | ICD-10-CM | POA: Insufficient documentation

## 2017-05-24 DIAGNOSIS — L03115 Cellulitis of right lower limb: Secondary | ICD-10-CM | POA: Insufficient documentation

## 2017-05-24 DIAGNOSIS — Z7984 Long term (current) use of oral hypoglycemic drugs: Secondary | ICD-10-CM | POA: Diagnosis not present

## 2017-05-24 DIAGNOSIS — L97519 Non-pressure chronic ulcer of other part of right foot with unspecified severity: Secondary | ICD-10-CM | POA: Diagnosis not present

## 2017-05-24 DIAGNOSIS — E11621 Type 2 diabetes mellitus with foot ulcer: Secondary | ICD-10-CM | POA: Insufficient documentation

## 2017-05-24 DIAGNOSIS — J45909 Unspecified asthma, uncomplicated: Secondary | ICD-10-CM | POA: Diagnosis not present

## 2017-05-24 DIAGNOSIS — L97511 Non-pressure chronic ulcer of other part of right foot limited to breakdown of skin: Secondary | ICD-10-CM

## 2017-05-24 NOTE — ED Triage Notes (Signed)
No known injury but rt little toe pain x several days .  States noticed toe was turning black today

## 2017-05-25 ENCOUNTER — Emergency Department (HOSPITAL_BASED_OUTPATIENT_CLINIC_OR_DEPARTMENT_OTHER): Payer: Medicaid Other

## 2017-05-25 LAB — CBC WITH DIFFERENTIAL/PLATELET
BASOS ABS: 0 10*3/uL (ref 0.0–0.1)
BASOS PCT: 0 %
Eosinophils Absolute: 0.2 10*3/uL (ref 0.0–0.7)
Eosinophils Relative: 2 %
HEMATOCRIT: 39.5 % (ref 36.0–46.0)
HEMOGLOBIN: 13.9 g/dL (ref 12.0–15.0)
Lymphocytes Relative: 16 %
Lymphs Abs: 1.8 10*3/uL (ref 0.7–4.0)
MCH: 29 pg (ref 26.0–34.0)
MCHC: 35.2 g/dL (ref 30.0–36.0)
MCV: 82.3 fL (ref 78.0–100.0)
MONOS PCT: 5 %
Monocytes Absolute: 0.6 10*3/uL (ref 0.1–1.0)
NEUTROS ABS: 8.5 10*3/uL — AB (ref 1.7–7.7)
NEUTROS PCT: 77 %
Platelets: 422 10*3/uL — ABNORMAL HIGH (ref 150–400)
RBC: 4.8 MIL/uL (ref 3.87–5.11)
RDW: 13.8 % (ref 11.5–15.5)
WBC: 11.1 10*3/uL — ABNORMAL HIGH (ref 4.0–10.5)

## 2017-05-25 LAB — I-STAT CG4 LACTIC ACID, ED: Lactic Acid, Venous: 1.04 mmol/L (ref 0.5–1.9)

## 2017-05-25 LAB — BASIC METABOLIC PANEL
Anion gap: 8 (ref 5–15)
BUN: 15 mg/dL (ref 6–20)
CO2: 30 mmol/L (ref 22–32)
Calcium: 9.4 mg/dL (ref 8.9–10.3)
Chloride: 100 mmol/L — ABNORMAL LOW (ref 101–111)
Creatinine, Ser: 0.69 mg/dL (ref 0.44–1.00)
GFR calc non Af Amer: >60 mL/min (ref >60)
Glucose, Bld: 128 mg/dL — ABNORMAL HIGH (ref 65–99)
POTASSIUM: 3.6 mmol/L (ref 3.5–5.1)
SODIUM: 138 mmol/L (ref 135–145)

## 2017-05-25 LAB — PREGNANCY, URINE: Preg Test, Ur: NEGATIVE

## 2017-05-25 LAB — CBG MONITORING, ED: GLUCOSE-CAPILLARY: 125 mg/dL — AB (ref 65–99)

## 2017-05-25 MED ORDER — MORPHINE SULFATE (PF) 4 MG/ML IV SOLN
4.0000 mg | Freq: Once | INTRAVENOUS | Status: AC
  Administered 2017-05-25: 4 mg via INTRAVENOUS
  Filled 2017-05-25: qty 1

## 2017-05-25 MED ORDER — CEPHALEXIN 500 MG PO CAPS
500.0000 mg | ORAL_CAPSULE | Freq: Four times a day (QID) | ORAL | 0 refills | Status: DC
Start: 2017-05-25 — End: 2017-07-02

## 2017-05-25 MED ORDER — SULFAMETHOXAZOLE-TRIMETHOPRIM 800-160 MG PO TABS: 1.0000 | ORAL_TABLET | Freq: Two times a day (BID) | ORAL | 0 refills | Status: AC

## 2017-05-25 MED ORDER — VANCOMYCIN HCL IN DEXTROSE 1-5 GM/200ML-% IV SOLN
1000.0000 mg | Freq: Once | INTRAVENOUS | Status: AC
  Administered 2017-05-25: 1000 mg via INTRAVENOUS
  Filled 2017-05-25: qty 200

## 2017-05-25 MED ORDER — KETOROLAC TROMETHAMINE 30 MG/ML IJ SOLN
30.0000 mg | Freq: Once | INTRAMUSCULAR | Status: AC
  Administered 2017-05-25: 30 mg via INTRAVENOUS
  Filled 2017-05-25: qty 1

## 2017-05-25 MED ORDER — SODIUM CHLORIDE 0.9 % IV BOLUS (SEPSIS)
1000.0000 mL | Freq: Once | INTRAVENOUS | Status: AC
  Administered 2017-05-25: 1000 mL via INTRAVENOUS

## 2017-05-25 MED ORDER — TRAMADOL HCL 50 MG PO TABS: 50.0000 mg | ORAL_TABLET | Freq: Four times a day (QID) | ORAL | 0 refills | Status: DC | PRN

## 2017-05-25 MED ORDER — FLUCONAZOLE 150 MG PO TABS: 150.0000 mg | ORAL_TABLET | Freq: Once | ORAL | 0 refills | Status: AC

## 2017-05-25 MED ORDER — CEFTRIAXONE SODIUM 1 G IJ SOLR
1.0000 g | Freq: Once | INTRAMUSCULAR | Status: AC
  Administered 2017-05-25: 1 g via INTRAVENOUS
  Filled 2017-05-25: qty 10

## 2017-05-25 MED ORDER — PROBIOTIC PO CAPS: 1.0000 | ORAL_CAPSULE | Freq: Every day | ORAL | 1 refills | Status: DC

## 2017-05-25 NOTE — ED Provider Notes (Signed)
TIME SEEN: 12:25 AM  CHIEF COMPLAINT: Possible right foot infection  HPI: Patient is a 36 year old insulin-dependent diabetic with history of asthma who presents emergency department with redness, warmth and pain to the right foot. She states for the past 2 weeks she has had an ulcer to the lateral aspect of the right fifth toe. She denies any injury to this area. States over the past 24 hours she has noticed redness in the dorsal aspect of the right foot and increasing pain and mild swelling. No history of DVT. No calf tenderness or calf swelling. She reports associated chills and nausea but no vomiting or diarrhea. No documented fever. States her blood glucose has been under control. She has an endocrinologist but states she does not have a primary care physician. She previously has had cellulitis in the left foot that failed outpatient oral antibiotics and she had to be admitted to the hospital for 5 days with IV antibiotics. At this time she would like to avoid admission.  ROS: See HPI Constitutional: no fever  Eyes: no drainage  ENT: no runny nose   Cardiovascular:  no chest pain  Resp: no SOB  GI: no vomiting GU: no dysuria Integumentary: no rash  Allergy: no hives  Musculoskeletal: no leg swelling  Neurological: no slurred speech ROS otherwise negative  PAST MEDICAL HISTORY/PAST SURGICAL HISTORY:  Past Medical History:  Diagnosis Date  . Asthma   . Cellulitis of left foot   . Diabetes mellitus without complication ([place])   . IBS (irritable bowel syndrome)     MEDICATIONS:  Prior to Admission medications   Medication Sig Start Date End Date Taking? Authorizing Provider  blood glucose meter kit and supplies Dispense based on patient and insurance preference. Use up to four times daily as directed. (FOR ICD-9 250.00, 250.01). 10/25/16   Patrecia Pour, Christean Grief, MD  cephALEXin (KEFLEX) 500 MG capsule Take 1 capsule (500 mg total) by mouth 3 (three) times daily. 05/03/17   Alfonzo Beers,  MD  famotidine (PEPCID) 20 MG tablet Take 1 tablet (20 mg total) by mouth 2 (two) times daily. 10/25/16   Doreatha Lew, MD  insulin aspart (NOVOLOG) 100 UNIT/ML injection Inject 35 Units into the skin 2 (two) times daily before a meal. 10/25/16   Patrecia Pour, Christean Grief, MD  insulin detemir (LEVEMIR) 100 UNIT/ML injection Inject 1 mL (100 Units total) into the skin at bedtime. 10/25/16   Doreatha Lew, MD  metFORMIN (GLUCOPHAGE) 500 MG tablet Take 500 mg by mouth 2 (two) times daily with a meal.    [provider]  metoCLOPramide (REGLAN) 5 MG tablet Take 1 tablet (5 mg total) by mouth 4 (four) times daily -  before meals and at bedtime. 10/25/16   Doreatha Lew, MD  ondansetron (ZOFRAN) 4 MG tablet Take 1 tablet (4 mg total) by mouth every 6 (six) hours. 05/03/17   Alfonzo Beers, MD  terbinafine (LAMISIL) 250 MG tablet Take 1 tablet (250 mg total) by mouth daily. 10/25/16   Doreatha Lew, MD    ALLERGIES:  No Known Allergies  SOCIAL HISTORY:  Social History  Substance Use Topics  . Smoking status: Never Smoker  . Smokeless tobacco: Never Used  . Alcohol use No    FAMILY HISTORY: Family History  Problem Relation Age of Onset  . Diabetes Mother   . Diabetes Father     EXAM: BP 122/85   Pulse (!) 120   Temp 98.7 F (37.1 C) (Oral)  Resp 20   Ht '5\' 7"'  (1.702 m)   Wt (!) 212 kg (467 lb 6 oz)   LMP 04/25/2017   SpO2 99%   BMI 73.20 kg/m  CONSTITUTIONAL: Alert and oriented and responds appropriately to questions. Well-appearing; well-nourished, In no distress HEAD: Normocephalic EYES: Conjunctivae clear, pupils appear equal, EOMI ENT: normal nose; moist mucous membranes NECK: Supple, no meningismus, no nuchal rigidity, no LAD  CARD: Regular and tachycardic; S1 and S2 appreciated; no murmurs, no clicks, no rubs, no gallops RESP: Normal chest excursion without splinting or tachypnea; breath sounds clear and equal bilaterally; no wheezes, no rhonchi,  no rales, no hypoxia or respiratory distress, speaking full sentences ABD/GI: Normal bowel sounds; non-distended; soft, non-tender, no rebound, no guarding, no peritoneal signs, no hepatosplenomegaly BACK:  The back appears normal and is non-tender to palpation, there is no CVA tenderness EXT: Patient has a superficial ulcer noted to the right fifth toe to the lateral aspect without any drainage or surrounding erythema or warmth. She does have redness over the right dorsal foot but does not encompass the entire foot. There is no joint effusion noted on exam. She has 2+ DP pulses bilaterally. Normal ROM in all joints; tender over the area of cellulitis but otherwise extremity is are non-tender to palpation; no edema; normal capillary refill; no cyanosis, no calf tenderness or swelling, normal sensation throughout both legs diffusely SKIN: Normal color for age and race; warm; no rash NEURO: Moves all extremities equally, able to ambulate with normal gait PSYCH: The patient's mood and manner are appropriate. Grooming and personal hygiene are appropriate.  MEDICAL DECISION MAKING: Patient here with cellulitis of the right foot in a chronic ulcer that does not appear infected to the right fifth toe. She has normal capillary refill diffusely and strong pulses. No sign of any necrosis at this time. I do not think this is vascular in nature but rather infectious secondary to her history of diabetes. Her blood glucose here is 125. Will obtain x-ray of this foot to ensure no bony destruction. Will check labs given she is tachycardic here although she states her heart rate is normally in the 110 hours. We'll give IV fluids, pain medication. Will also give a dose of IV vancomycin and IV Rocephin for broad coverage for cellulitis in a diabetic patient.  ED PROGRESS: His labs show mild leukocytosis with left shift but otherwise unremarkable with normal lactate. On my evaluation her heart rate is 103. Her pain is  improved. She is not pregnant. X-ray shows no acute abnormality other than soft tissue swelling. Again she would prefer to go home and not be admitted at this time which I feel is not unreasonable. I have recommended close follow-up with her primary care physician or a podiatrist and tight glucose control at home. We have outlined the area of redness with a tissue marker and I will discharge her with Keflex and Bactrim for broad coverage. We'll discharge with prescription of pain medication. Given outpatient follow-up information. She is comfortable with this plan. Discussed return precautions.  At this time, I do not feel there is any life-threatening condition present. I have reviewed and discussed all results (EKG, imaging, lab, urine as appropriate) and exam findings with patient/family. I have reviewed nursing notes and appropriate previous records.  I feel the patient is safe to be discharged home without further emergent workup and can continue workup as an outpatient as needed. Discussed usual and customary return precautions. Patient/family verbalize understanding and  are comfortable with this plan.  Outpatient follow-up has been provided if needed. All questions have been answered.      Brendt Dible, Delice Bison, DO 05/25/17 [phone]

## 2017-05-25 NOTE — ED Notes (Signed)
CBG 125 °

## 2017-05-25 NOTE — Discharge Instructions (Signed)
You may alternate Tylenol 1000 mg every 6 hours as needed for pain and Ibuprofen 800 mg every 8 hours as needed for pain.  Please take Ibuprofen with food. ° ° ° °To find a primary care or specialty doctor please call [phone] or [phone] to access "[facility] Find a Doctor Service." ° °You may also go on the [facility] website at [email or web address] ° °There are also multiple [facility], [facility], [facility] and [facility] practices throughout the [place] that are frequently accepting new patients. You may find a clinic that is close to your home and contact them. ° °[facility] -  °[address] °[place] [address] °[phone] ° ° °[facility] -  °[address] °[place] [address] [phone] ° ° °[facility] - °[address]  °[place] [address] °[phone] ° ° °

## 2017-07-02 ENCOUNTER — Emergency Department (HOSPITAL_BASED_OUTPATIENT_CLINIC_OR_DEPARTMENT_OTHER): Payer: Medicaid Other

## 2017-07-02 ENCOUNTER — Emergency Department (HOSPITAL_BASED_OUTPATIENT_CLINIC_OR_DEPARTMENT_OTHER)
Admission: EM | Admit: 2017-07-02 | Discharge: 2017-07-02 | Disposition: A | Discharge status: Home / Self Care | Payer: Medicaid Other | Attending: Emergency Medicine | Admitting: Emergency Medicine

## 2017-07-02 ENCOUNTER — Encounter (HOSPITAL_BASED_OUTPATIENT_CLINIC_OR_DEPARTMENT_OTHER): Payer: Self-pay

## 2017-07-02 DIAGNOSIS — E1140 Type 2 diabetes mellitus with diabetic neuropathy, unspecified: Secondary | ICD-10-CM | POA: Diagnosis not present

## 2017-07-02 DIAGNOSIS — M79674 Pain in right toe(s): Secondary | ICD-10-CM | POA: Diagnosis present

## 2017-07-02 DIAGNOSIS — L97519 Non-pressure chronic ulcer of other part of right foot with unspecified severity: Secondary | ICD-10-CM | POA: Diagnosis not present

## 2017-07-02 DIAGNOSIS — Z23 Encounter for immunization: Secondary | ICD-10-CM | POA: Insufficient documentation

## 2017-07-02 DIAGNOSIS — L03031 Cellulitis of right toe: Secondary | ICD-10-CM | POA: Diagnosis not present

## 2017-07-02 DIAGNOSIS — Z794 Long term (current) use of insulin: Secondary | ICD-10-CM | POA: Insufficient documentation

## 2017-07-02 DIAGNOSIS — E11621 Type 2 diabetes mellitus with foot ulcer: Secondary | ICD-10-CM | POA: Insufficient documentation

## 2017-07-02 DIAGNOSIS — J45909 Unspecified asthma, uncomplicated: Secondary | ICD-10-CM | POA: Diagnosis not present

## 2017-07-02 DIAGNOSIS — L539 Erythematous condition, unspecified: Secondary | ICD-10-CM

## 2017-07-02 MED ORDER — DOXYCYCLINE HYCLATE 100 MG PO CAPS: 100.0000 mg | ORAL_CAPSULE | Freq: Two times a day (BID) | ORAL | 0 refills | Status: DC

## 2017-07-02 MED ORDER — TETANUS-DIPHTH-ACELL PERTUSSIS 5-2.5-18.5 LF-MCG/0.5 IM SUSP
0.5000 mL | Freq: Once | INTRAMUSCULAR | Status: AC
  Administered 2017-07-02: 0.5 mL via INTRAMUSCULAR
  Filled 2017-07-02: qty 0.5

## 2017-07-02 MED FILL — DOXYCYCLINE HYCLATE 100 MG: 100 | 7 days supply | Qty: 14 | Fill #0

## 2017-07-02 NOTE — ED Triage Notes (Signed)
C/o pain redness to right pinky toe x 3 months-states she has been seen x 3 for same c/o-abx x 3-NAD-limping gait

## 2017-07-02 NOTE — ED Provider Notes (Signed)
[facility] Provider Note   CSN: [id number] Arrival date & time: 07/02/17  1042     History   Chief Complaint Chief Complaint  Patient presents with  . Toe Pain    HPI Lindsay Love is a 36 y.o. female.  Patient is a 36-year-old female with a history of diabetes who presents with redness and pain to her right fifth toe. She states she's had a chronic ulcer to that toe and has had recurrent episodes of cellulitis. She states she was on antibiotics about a month ago and it improved but over the last week it's gotten worse again. She has seen her endocrinologist but has no other provider taking care of this ulcer. She states she is still taking her diabetic medications. She denies any fevers. She does note some drainage from the wound. She has a throbbing pain to the toe that radiates up her foot and lower leg. She denies any swelling to the leg.      Past Medical History:  Diagnosis Date  . Asthma   . Cellulitis of left foot   . Diabetes mellitus without complication ([facility])   . IBS (irritable bowel syndrome)     Patient Active Problem List   Diagnosis Date Noted  . Cellulitis of right foot 10/22/2016  . Diabetes ([facility]) 10/22/2016  . IBS (irritable bowel syndrome) 10/22/2016  . Asthma 10/22/2016  . Right leg pain 10/22/2016  . Cellulitis and abscess of toe of right foot   . Diabetic peripheral neuropathy ([facility])   . Failure of outpatient treatment     Past Surgical History:  Procedure Laterality Date  . CESAREAN SECTION    . CHOLECYSTECTOMY    . CYST EXCISION    . TONSILLECTOMY    . TOOTH EXTRACTION      OB History    No data available       Home Medications    Prior to Admission medications   Medication Sig Start Date End Date Taking? Authorizing Provider  blood glucose meter kit and supplies Dispense based on patient and insurance preference. Use up to four times daily as directed. (FOR ICD-9 250.00, 250.01). 10/25/16   Silva Zapata, Edwin, MD    doxycycline (VIBRAMYCIN) 100 MG capsule Take 1 capsule (100 mg total) by mouth 2 (two) times daily. One po bid x 7 days 07/02/17   Belfi, Melanie, MD  famotidine (PEPCID) 20 MG tablet Take 1 tablet (20 mg total) by mouth 2 (two) times daily. 10/25/16   Silva Zapata, Edwin, MD  insulin aspart (NOVOLOG) 100 UNIT/ML injection Inject 35 Units into the skin 2 (two) times daily before a meal. 10/25/16   Silva Zapata, Edwin, MD  insulin detemir (LEVEMIR) 100 UNIT/ML injection Inject 1 mL (100 Units total) into the skin at bedtime. 10/25/16   Silva Zapata, Edwin, MD  metFORMIN (GLUCOPHAGE) 500 MG tablet Take 500 mg by mouth 2 (two) times daily with a meal.    [provider]  Probiotic CAPS Take 1 capsule by mouth daily. 05/25/17   Ward, Kristen N, DO  traMADol (ULTRAM) 50 MG tablet Take 1 tablet (50 mg total) by mouth every 6 (six) hours as needed. 05/25/17   Ward, Kristen N, DO    Family History Family History  Problem Relation Age of Onset  . Diabetes Mother   . Diabetes Father     Social History Social History  Substance Use Topics  . Smoking status: Never Smoker  . Smokeless tobacco: Never Used  .   Alcohol use No     Allergies   Patient has no known allergies.   Review of Systems Review of Systems  Constitutional: Negative for chills, diaphoresis, fatigue and fever.  HENT: Negative for congestion, rhinorrhea and sneezing.   Eyes: Negative.   Respiratory: Negative for cough, chest tightness and shortness of breath.   Cardiovascular: Negative for chest pain and leg swelling.  Gastrointestinal: Negative for abdominal pain, blood in stool, diarrhea, nausea and vomiting.  Genitourinary: Negative for difficulty urinating, flank pain, frequency and hematuria.  Musculoskeletal: Positive for arthralgias. Negative for back pain.  Skin: Positive for color change and wound. Negative for rash.  Neurological: Negative for dizziness, speech difficulty, weakness, numbness and headaches.      Physical Exam Updated Vital Signs BP (!) 137/94 (BP Location: Right Arm)   Pulse 95   Temp 99.2 F (37.3 C) (Oral)   Resp 18   Wt 99.6 kg (219 lb 9.3 oz)   LMP 06/26/2017   SpO2 95%   BMI 34.39 kg/m   Physical Exam  Constitutional: She is oriented to person, place, and time. She appears well-developed and well-nourished.  HENT:  Head: Normocephalic and atraumatic.  Eyes: Pupils are equal, round, and reactive to light.  Neck: Normal range of motion. Neck supple.  Cardiovascular: Normal rate, regular rhythm and normal heart sounds.   Pulmonary/Chest: Effort normal and breath sounds normal. No respiratory distress. She has no wheezes. She has no rales. She exhibits no tenderness.  Abdominal: Soft. Bowel sounds are normal. There is no tenderness. There is no rebound and no guarding.  Musculoskeletal: Normal range of motion. She exhibits no edema.  Patient has mild redness to the right fifth digit of the foot. There is no extension to the foot. There is a dry appearing scabbed over ulcer to the lateral aspect of the toe. No drainage is noted. The toe is tender on palpation. There is no other bony tenderness to the foot. No streaking up the foot. Pedal pulses are intact.  Lymphadenopathy:    She has no cervical adenopathy.  Neurological: She is alert and oriented to person, place, and time.  Skin: Skin is warm and dry. No rash noted.  Psychiatric: She has a normal mood and affect.     ED Treatments / Results  Labs (all labs ordered are listed, but only abnormal results are displayed) Labs Reviewed - No data to display  EKG  EKG Interpretation None       Radiology Dg Toe 5th Right  Result Date: 07/02/2017 CLINICAL DATA:  Open wound over fifth toe for 3 months. On antibiotics. EXAM: RIGHT FIFTH TOE COMPARISON:  05/25/2017 FINDINGS: Mild diffuse soft tissue swelling noted. No underlying fracture or dislocation. No focal bone erosion identified. IMPRESSION: 1. No acute  bone abnormality. 2. Soft tissue swelling. Electronically Signed   By: Taylor  Stroud M.D.   On: 07/02/2017 12:42    Procedures Procedures (including critical care time)  Medications Ordered in ED Medications  Tdap (BOOSTRIX) injection 0.5 mL (0.5 mLs Intramuscular Given 07/02/17 1314)     Initial Impression / Assessment and Plan / ED Course  I have reviewed the triage vital signs and the nursing notes.  Pertinent labs & imaging results that were available during my care of the patient were reviewed by me and considered in my medical decision making (see chart for details).     Patient presents with some redness to her toe. I will treat her for cellulitis. There is no   open wounds. She has good perfusion to the foot. There is no extension of the infection past her toe. No evidence of osteomyelitis. I will start her on doxycycline. I gave her referral to follow-up with the wound clinic for possible treatment since she's had this long-standing ulcer. Return precautions were given.  Final Clinical Impressions(s) / ED Diagnoses   Final diagnoses:  Redness  Cellulitis of toe of right foot    New Prescriptions New Prescriptions   DOXYCYCLINE (VIBRAMYCIN) 100 MG CAPSULE    Take 1 capsule (100 mg total) by mouth 2 (two) times daily. One po bid x 7 days     Belfi, Melanie, MD 07/02/17 1339  

## 2017-08-05 ENCOUNTER — Encounter (HOSPITAL_BASED_OUTPATIENT_CLINIC_OR_DEPARTMENT_OTHER): Payer: Self-pay | Admitting: *Deleted

## 2017-08-05 ENCOUNTER — Emergency Department (HOSPITAL_BASED_OUTPATIENT_CLINIC_OR_DEPARTMENT_OTHER)
Admission: EM | Admit: 2017-08-05 | Discharge: 2017-08-06 | Disposition: A | Discharge status: Home / Self Care | Payer: Medicaid Other | Attending: Emergency Medicine | Admitting: Emergency Medicine

## 2017-08-05 DIAGNOSIS — Z794 Long term (current) use of insulin: Secondary | ICD-10-CM | POA: Insufficient documentation

## 2017-08-05 DIAGNOSIS — J45909 Unspecified asthma, uncomplicated: Secondary | ICD-10-CM | POA: Insufficient documentation

## 2017-08-05 DIAGNOSIS — E119 Type 2 diabetes mellitus without complications: Secondary | ICD-10-CM | POA: Diagnosis not present

## 2017-08-05 DIAGNOSIS — R300 Dysuria: Secondary | ICD-10-CM | POA: Diagnosis present

## 2017-08-05 DIAGNOSIS — N3000 Acute cystitis without hematuria: Secondary | ICD-10-CM

## 2017-08-05 LAB — URINALYSIS, MICROSCOPIC (REFLEX)

## 2017-08-05 LAB — URINALYSIS, ROUTINE W REFLEX MICROSCOPIC
Bilirubin Urine: NEGATIVE
Glucose, UA: >=500 mg/dL — AB
Ketones, ur: NEGATIVE mg/dL
NITRITE: POSITIVE — AB
PH: 5 (ref 5.0–8.0)
Protein, ur: 100 mg/dL — AB
Specific Gravity, Urine: 1.01 (ref 1.005–1.030)

## 2017-08-05 LAB — PREGNANCY, URINE: Preg Test, Ur: NEGATIVE

## 2017-08-05 NOTE — ED Triage Notes (Signed)
Pt c/o painful freq urination x 2 weeks 

## 2017-08-06 MED ORDER — CEPHALEXIN 500 MG PO CAPS: 500.0000 mg | ORAL_CAPSULE | Freq: Four times a day (QID) | ORAL | 0 refills | Status: DC

## 2017-08-06 MED ORDER — CEFTRIAXONE SODIUM 1 G IJ SOLR
1.0000 g | Freq: Once | INTRAMUSCULAR | Status: AC
  Administered 2017-08-06: 1 g via INTRAMUSCULAR
  Filled 2017-08-06: qty 10

## 2017-08-06 MED ORDER — LIDOCAINE HCL 1 % IJ SOLN
INTRAMUSCULAR | Status: AC
  Filled 2017-08-06: qty 10

## 2017-08-06 NOTE — ED Provider Notes (Signed)
[place] DEPT [facility] Provider Note   CSN: [id number] Arrival date & time: 08/05/17  2159     History   Chief Complaint Chief Complaint  Patient presents with  . Dysuria    HPI Lindsay Love is a 36 y.o. female.  Patient is a 36 year old female with history of recurrent UTIs presenting with complaints of dysuria, suprapubic pain, and urinary urgency. This is been ongoing for the past week and has been unrelieved with A's oh. She denies any fevers or chills. She denies any nausea or vomiting.   The history is provided by the patient.  Dysuria   This is a new problem. Episode onset: 1 week ago. The problem occurs every urination. The problem has been gradually worsening. The quality of the pain is described as burning. The pain is moderate. There has been no fever. Associated symptoms include urgency. Pertinent negatives include no chills, no discharge, no hematuria, no hesitancy and no flank pain. Treatments tried: Azo.    Past Medical History:  Diagnosis Date  . Asthma   . Cellulitis of left foot   . Diabetes mellitus without complication ([place])   . IBS (irritable bowel syndrome)     Patient Active Problem List   Diagnosis Date Noted  . Cellulitis of right foot 10/22/2016  . Diabetes ([place]) 10/22/2016  . IBS (irritable bowel syndrome) 10/22/2016  . Asthma 10/22/2016  . Right leg pain 10/22/2016  . Cellulitis and abscess of toe of right foot   . Diabetic peripheral neuropathy ([place])   . Failure of outpatient treatment     Past Surgical History:  Procedure Laterality Date  . CESAREAN SECTION    . CHOLECYSTECTOMY    . CYST EXCISION    . TONSILLECTOMY    . TOOTH EXTRACTION      OB History    No data available       Home Medications    Prior to Admission medications   Medication Sig Start Date End Date Taking? Authorizing Provider  blood glucose meter kit and supplies Dispense based on patient and insurance preference. Use up to four times daily as  directed. (FOR ICD-9 250.00, 250.01). 10/25/16   Patrecia Pour, Christean Grief, MD  doxycycline (VIBRAMYCIN) 100 MG capsule Take 1 capsule (100 mg total) by mouth 2 (two) times daily. One po bid x 7 days 07/02/17   Malvin Johns, MD  famotidine (PEPCID) 20 MG tablet Take 1 tablet (20 mg total) by mouth 2 (two) times daily. 10/25/16   Doreatha Lew, MD  insulin aspart (NOVOLOG) 100 UNIT/ML injection Inject 35 Units into the skin 2 (two) times daily before a meal. 10/25/16   Patrecia Pour, Christean Grief, MD  insulin detemir (LEVEMIR) 100 UNIT/ML injection Inject 1 mL (100 Units total) into the skin at bedtime. 10/25/16   Doreatha Lew, MD  metFORMIN (GLUCOPHAGE) 500 MG tablet Take 500 mg by mouth 2 (two) times daily with a meal.    [provider]  Probiotic CAPS Take 1 capsule by mouth daily. 05/25/17   Ward, Delice Bison, DO  traMADol (ULTRAM) 50 MG tablet Take 1 tablet (50 mg total) by mouth every 6 (six) hours as needed. 05/25/17   Ward, Delice Bison, DO    Family History Family History  Problem Relation Age of Onset  . Diabetes Mother   . Diabetes Father     Social History Social History  Substance Use Topics  . Smoking status: Never Smoker  . Smokeless tobacco: Never Used  . Alcohol  use No     Allergies   Patient has no known allergies.   Review of Systems Review of Systems  Constitutional: Negative for chills.  Genitourinary: Positive for dysuria and urgency. Negative for flank pain, hematuria and hesitancy.  All other systems reviewed and are negative.    Physical Exam Updated Vital Signs BP 137/82   Pulse (!) 114   Temp 98.6 F (37 C)   Resp 18   Ht '5\' 7"'  (1.702 m)   Wt 95.3 kg (210 lb)   LMP 07/30/2017   SpO2 100%   BMI 32.89 kg/m   Physical Exam  Constitutional: She is oriented to person, place, and time. She appears well-developed and well-nourished. No distress.  HENT:  Head: Normocephalic and atraumatic.  Neck: Normal range of motion. Neck supple.    Cardiovascular: Normal rate and regular rhythm.  Exam reveals no gallop and no friction rub.   No murmur heard. Pulmonary/Chest: Effort normal and breath sounds normal. No respiratory distress. She has no wheezes.  Abdominal: Soft. Bowel sounds are normal. She exhibits no distension. There is tenderness. There is no rebound and no guarding.  There is mild suprapubic tenderness.  Musculoskeletal: Normal range of motion.  Neurological: She is alert and oriented to person, place, and time.  Skin: Skin is warm and dry. She is not diaphoretic.  Nursing note and vitals reviewed.    ED Treatments / Results  Labs (all labs ordered are listed, but only abnormal results are displayed) Labs Reviewed  URINALYSIS, ROUTINE W REFLEX MICROSCOPIC - Abnormal; Notable for the following:       Result Value   Color, Urine ORANGE (*)    APPearance CLOUDY (*)    Glucose, UA >=500 (*)    Hgb urine dipstick MODERATE (*)    Protein, ur 100 (*)    Nitrite POSITIVE (*)    Leukocytes, UA TRACE (*)    All other components within normal limits  URINALYSIS, MICROSCOPIC (REFLEX) - Abnormal; Notable for the following:    Bacteria, UA FEW (*)    Squamous Epithelial / LPF 6-30 (*)    All other components within normal limits  PREGNANCY, URINE    EKG  EKG Interpretation None       Radiology No results found.  Procedures Procedures (including critical care time)  Medications Ordered in ED Medications  cefTRIAXone (ROCEPHIN) injection 1 g (not administered)     Initial Impression / Assessment and Plan / ED Course  I have reviewed the triage vital signs and the nursing notes.  Pertinent labs & imaging results that were available during my care of the patient were reviewed by me and considered in my medical decision making (see chart for details).  UA consistent with a urinary tract infection. This will be treated with IM Rocephin, by mouth Keflex, and follow-up as needed.  Final Clinical  Impressions(s) / ED Diagnoses   Final diagnoses:  None    New Prescriptions New Prescriptions   No medications on file     Veryl Speak, MD 08/06/17 [phone]

## 2017-08-06 NOTE — Discharge Instructions (Signed)
Keflex as prescribed.  Follow-up with primary Dr. if symptoms are not improving in the next 2-3 days, and return to the ER if you develop high fever, severe abdominal pain, or other new and concerning symptoms

## 2017-08-10 ENCOUNTER — Encounter (HOSPITAL_BASED_OUTPATIENT_CLINIC_OR_DEPARTMENT_OTHER): Payer: Self-pay | Admitting: Emergency Medicine

## 2017-08-10 ENCOUNTER — Emergency Department (HOSPITAL_BASED_OUTPATIENT_CLINIC_OR_DEPARTMENT_OTHER)
Admission: EM | Admit: 2017-08-10 | Discharge: 2017-08-11 | Disposition: A | Discharge status: Home / Self Care | Payer: Medicaid Other | Attending: Emergency Medicine | Admitting: Emergency Medicine

## 2017-08-10 DIAGNOSIS — N3090 Cystitis, unspecified without hematuria: Secondary | ICD-10-CM | POA: Diagnosis not present

## 2017-08-10 DIAGNOSIS — J45909 Unspecified asthma, uncomplicated: Secondary | ICD-10-CM | POA: Insufficient documentation

## 2017-08-10 DIAGNOSIS — E1140 Type 2 diabetes mellitus with diabetic neuropathy, unspecified: Secondary | ICD-10-CM | POA: Insufficient documentation

## 2017-08-10 DIAGNOSIS — B9620 Unspecified Escherichia coli [E. coli] as the cause of diseases classified elsewhere: Secondary | ICD-10-CM | POA: Insufficient documentation

## 2017-08-10 DIAGNOSIS — R300 Dysuria: Secondary | ICD-10-CM

## 2017-08-10 DIAGNOSIS — Z79899 Other long term (current) drug therapy: Secondary | ICD-10-CM | POA: Diagnosis not present

## 2017-08-10 DIAGNOSIS — Z794 Long term (current) use of insulin: Secondary | ICD-10-CM | POA: Insufficient documentation

## 2017-08-10 LAB — URINALYSIS, MICROSCOPIC (REFLEX)

## 2017-08-10 LAB — URINALYSIS, ROUTINE W REFLEX MICROSCOPIC
BILIRUBIN URINE: NEGATIVE
Glucose, UA: >=500 mg/dL — AB
Ketones, ur: NEGATIVE mg/dL
Nitrite: NEGATIVE
PH: 6 (ref 5.0–8.0)
Protein, ur: NEGATIVE mg/dL
SPECIFIC GRAVITY, URINE: 1.015 (ref 1.005–1.030)

## 2017-08-10 MED ORDER — PHENAZOPYRIDINE HCL 100 MG PO TABS
95.0000 mg | ORAL_TABLET | Freq: Once | ORAL | Status: AC
  Administered 2017-08-11: 100 mg via ORAL
  Filled 2017-08-10: qty 1

## 2017-08-10 NOTE — ED Triage Notes (Signed)
PT presents with c/o painful urination. Pt reports she was here a few days ago for same and given antibiotics and is not feeling better.

## 2017-08-11 ENCOUNTER — Emergency Department (HOSPITAL_BASED_OUTPATIENT_CLINIC_OR_DEPARTMENT_OTHER): Payer: Medicaid Other

## 2017-08-11 LAB — BASIC METABOLIC PANEL
ANION GAP: 7 (ref 5–15)
BUN: 15 mg/dL (ref 6–20)
CALCIUM: 9.3 mg/dL (ref 8.9–10.3)
CO2: 24 mmol/L (ref 22–32)
Chloride: 103 mmol/L (ref 101–111)
Creatinine, Ser: 0.73 mg/dL (ref 0.44–1.00)
Glucose, Bld: 263 mg/dL — ABNORMAL HIGH (ref 65–99)
Potassium: 3.6 mmol/L (ref 3.5–5.1)
Sodium: 134 mmol/L — ABNORMAL LOW (ref 135–145)

## 2017-08-11 LAB — CBC WITH DIFFERENTIAL/PLATELET
Basophils Absolute: 0 10*3/uL (ref 0.0–0.1)
Basophils Relative: 0 %
EOS ABS: 0.3 10*3/uL (ref 0.0–0.7)
EOS PCT: 4 %
HCT: 42.3 % (ref 36.0–46.0)
Hemoglobin: 14.5 g/dL (ref 12.0–15.0)
LYMPHS ABS: 1.8 10*3/uL (ref 0.7–4.0)
LYMPHS PCT: 24 %
MCH: 28.3 pg (ref 26.0–34.0)
MCHC: 34.3 g/dL (ref 30.0–36.0)
MCV: 82.6 fL (ref 78.0–100.0)
MONO ABS: 0.4 10*3/uL (ref 0.1–1.0)
Monocytes Relative: 6 %
Neutro Abs: 4.7 10*3/uL (ref 1.7–7.7)
Neutrophils Relative %: 66 %
PLATELETS: 406 10*3/uL — AB (ref 150–400)
RBC: 5.12 MIL/uL — AB (ref 3.87–5.11)
RDW: 14 % (ref 11.5–15.5)
WBC: 7.2 10*3/uL (ref 4.0–10.5)

## 2017-08-11 LAB — PREGNANCY, URINE: PREG TEST UR: NEGATIVE

## 2017-08-11 MED ORDER — CIPROFLOXACIN HCL 500 MG PO TABS: 500.0000 mg | ORAL_TABLET | Freq: Two times a day (BID) | ORAL | 0 refills | Status: DC

## 2017-08-11 MED ORDER — PHENAZOPYRIDINE HCL 100 MG PO TABS: 100.0000 mg | ORAL_TABLET | Freq: Three times a day (TID) | ORAL | 0 refills | Status: DC | PRN

## 2017-08-11 NOTE — ED Provider Notes (Signed)
[place] DEPT [facility] Provider Note   CSN: [id number] Arrival date & time: 08/10/17  2318     History   Chief Complaint Chief Complaint  Patient presents with  . Dysuria    HPI Lindsay Love is a 36 y.o. female.  HPI   This is a 36 year old female with history of diabetes who presents with persistent dysuria and urgency. Patient was seen and evaluated 4 days ago for the same. She is diagnosed with UTI. She was discharged with Keflex. She reports that she has been taking it as prescribed. She reports persistent urgency symptoms and dysuria. She also reports suprapubic pain. No fevers. She does report some right-sided back pain. She is a history of UTIs and states "I usually get a different antibiotic." Currently she rates her pain at 10. She has taken over-the-counter AZO which is not helping.   I reviewed the patient's chart. No urine culture was sent. Her most recent urine culture in June 2018 showed pansensitive Escherichia coli.  Past Medical History:  Diagnosis Date  . Asthma   . Cellulitis of left foot   . Diabetes mellitus without complication ([place])   . IBS (irritable bowel syndrome)     Patient Active Problem List   Diagnosis Date Noted  . Cellulitis of right foot 10/22/2016  . Diabetes ([place]) 10/22/2016  . IBS (irritable bowel syndrome) 10/22/2016  . Asthma 10/22/2016  . Right leg pain 10/22/2016  . Cellulitis and abscess of toe of right foot   . Diabetic peripheral neuropathy ([place])   . Failure of outpatient treatment     Past Surgical History:  Procedure Laterality Date  . CESAREAN SECTION    . CHOLECYSTECTOMY    . CYST EXCISION    . TONSILLECTOMY    . TOOTH EXTRACTION      OB History    No data available       Home Medications    Prior to Admission medications   Medication Sig Start Date End Date Taking? Authorizing Provider  blood glucose meter kit and supplies Dispense based on patient and insurance preference. Use up to four times daily  as directed. (FOR ICD-9 250.00, 250.01). 10/25/16   Patrecia Pour, Christean Grief, MD  ciprofloxacin (CIPRO) 500 MG tablet Take 1 tablet (500 mg total) by mouth 2 (two) times daily. 08/11/17   Shraga Custard, Barbette Hair, MD  doxycycline (VIBRAMYCIN) 100 MG capsule Take 1 capsule (100 mg total) by mouth 2 (two) times daily. One po bid x 7 days 07/02/17   Malvin Johns, MD  famotidine (PEPCID) 20 MG tablet Take 1 tablet (20 mg total) by mouth 2 (two) times daily. 10/25/16   Doreatha Lew, MD  insulin aspart (NOVOLOG) 100 UNIT/ML injection Inject 35 Units into the skin 2 (two) times daily before a meal. 10/25/16   Patrecia Pour, Christean Grief, MD  insulin detemir (LEVEMIR) 100 UNIT/ML injection Inject 1 mL (100 Units total) into the skin at bedtime. 10/25/16   Doreatha Lew, MD  metFORMIN (GLUCOPHAGE) 500 MG tablet Take 500 mg by mouth 2 (two) times daily with a meal.    [provider]  phenazopyridine (PYRIDIUM) 100 MG tablet Take 1 tablet (100 mg total) by mouth 3 (three) times daily as needed for pain. 08/11/17   Tarryn Bogdan, Barbette Hair, MD  Probiotic CAPS Take 1 capsule by mouth daily. 05/25/17   Ward, Delice Bison, DO  traMADol (ULTRAM) 50 MG tablet Take 1 tablet (50 mg total) by mouth every 6 (six) hours as needed.  05/25/17   Ward, Delice Bison, DO    Family History Family History  Problem Relation Age of Onset  . Diabetes Mother   . Diabetes Father     Social History Social History  Substance Use Topics  . Smoking status: Never Smoker  . Smokeless tobacco: Never Used  . Alcohol use No     Allergies   Patient has no known allergies.   Review of Systems Review of Systems  Constitutional: Negative for fever.  Respiratory: Negative for shortness of breath.   Cardiovascular: Negative for chest pain.  Gastrointestinal: Negative for abdominal pain, diarrhea, nausea and vomiting.  Genitourinary: Positive for dysuria, pelvic pain and urgency. Negative for vaginal discharge.  All other systems reviewed  and are negative.    Physical Exam Updated Vital Signs BP 130/86 (BP Location: Right Arm)   Pulse (!) 104   Temp 98.7 F (37.1 C) (Oral)   Resp 19   LMP 07/30/2017   SpO2 99%   Physical Exam  Constitutional: She is oriented to person, place, and time. She appears well-developed and well-nourished.  Obese, no acute distress  HENT:  Head: Normocephalic and atraumatic.  Cardiovascular: Normal rate, regular rhythm and normal heart sounds.   Pulmonary/Chest: Effort normal. No respiratory distress. She has no wheezes.  Abdominal: Soft. Bowel sounds are normal. She exhibits no mass. There is tenderness. There is no guarding.  Suprapubic tenderness to palpation without rebound or guarding, no CVA tenderness  Neurological: She is alert and oriented to person, place, and time.  Skin: Skin is warm and dry.  Psychiatric: She has a normal mood and affect.  Nursing note and vitals reviewed.    ED Treatments / Results  Labs (all labs ordered are listed, but only abnormal results are displayed) Labs Reviewed  URINALYSIS, ROUTINE W REFLEX MICROSCOPIC - Abnormal; Notable for the following:       Result Value   APPearance CLOUDY (*)    Glucose, UA >=500 (*)    Hgb urine dipstick MODERATE (*)    Leukocytes, UA TRACE (*)    All other components within normal limits  CBC WITH DIFFERENTIAL/PLATELET - Abnormal; Notable for the following:    RBC 5.12 (*)    Platelets 406 (*)    All other components within normal limits  BASIC METABOLIC PANEL - Abnormal; Notable for the following:    Sodium 134 (*)    Glucose, Bld 263 (*)    All other components within normal limits  URINALYSIS, MICROSCOPIC (REFLEX) - Abnormal; Notable for the following:    Bacteria, UA FEW (*)    Squamous Epithelial / LPF 0-5 (*)    All other components within normal limits  URINE CULTURE  PREGNANCY, URINE    EKG  EKG Interpretation None       Radiology Ct Renal Stone Study  Result Date: 08/11/2017 CLINICAL  DATA:  Dysuria EXAM: CT ABDOMEN AND PELVIS WITHOUT CONTRAST TECHNIQUE: Multidetector CT imaging of the abdomen and pelvis was performed following the standard protocol without oral or intravenous contrast maternal administration. COMPARISON:  Apr 10, 2017 FINDINGS: Lower chest: Lung bases are clear. Hepatobiliary: No focal liver lesions are evident on this noncontrast enhanced study. Gallbladder is absent. No biliary duct dilatation. Pancreas: No pancreatic mass or inflammatory focus. Spleen: Spleen measures 13.7 x 7.2 x 11.5 cm with a measured splenic volume of 567 cubic cm. No focal splenic lesions are evident on this noncontrast enhanced study. Adrenals/Urinary Tract: Adrenals appear normal bilaterally. Kidneys bilaterally show  no evident mass or hydronephrosis on either side. There is no renal or ureteral calculus on either side. Urinary bladder is midline with wall thickness within normal limits. Stomach/Bowel: There is no appreciable bowel wall or mesenteric thickening. No bowel obstruction. No free air or portal venous air. Vascular/Lymphatic: There is no abdominal aortic aneurysm. No vascular lesions are seen on this noncontrast enhanced study. There is no appreciable adenopathy in the abdomen or pelvis. Reproductive: Uterus is anteverted. Uterus appears enlarged. There is an apparent leiomyoma arising from the rightward aspect of the uterus measuring 4.8 x 4.4 cm. No extrauterine pelvic mass is demonstrated on this study. Other: There is no periappendiceal region inflammation. No abscess or ascites is evident in the abdomen or pelvis. There is a rather minimal ventral hernia containing only fat. Musculoskeletal: There are no blastic or lytic bone lesions. No intramuscular or abdominal wall lesion. IMPRESSION: 1. No renal or ureteral calculus. No hydronephrosis. Urinary bladder wall thickness within normal limits. 2.  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 3. Splenomegaly of uncertain etiology.  No focal splenic lesions are evident. 4. No bowel obstruction. No abscess. No periappendiceal region inflammation. 5.  Small ventral hernia containing only fat. 6.  Gallbladder absent. Electronically Signed   By: Lowella Grip III M.D.   On: 08/11/2017 01:27    Procedures Procedures (including critical care time)  Medications Ordered in ED Medications  phenazopyridine (PYRIDIUM) tablet 100 mg (100 mg Oral Given 08/11/17 0055)     Initial Impression / Assessment and Plan / ED Course  I have reviewed the triage vital signs and the nursing notes.  Pertinent labs & imaging results that were available during my care of the patient were reviewed by me and considered in my medical decision making (see chart for details).     Patient presents with persistent symptoms of UTI. She is nontoxic on exam. Vital signs notable for mild tachycardia. No fever. No CVA tenderness. Given persistent symptoms and, basic labwork was obtained. No significant leukocytosis. Kidneys function preserved. Hyperglycemic without anion gap. Urinalysis appears improved from prior. Urine culture was added. Given persistent symptoms and improving urinalysis, CT renal stone study was obtained to rule out kidney stone. CT stone study is negative. Patient was given Pyridium. Given the persistence of symptoms and prior good response to Cipro, will switch to ciprofloxacin given recent urine culture with pansensitive Escherichia coli. Patient also given Pyridium at discharge for symptom control.  After history, exam, and medical workup I feel the patient has been appropriately medically screened and is safe for discharge home. Pertinent diagnoses were discussed with the patient. Patient was given return precautions.  Final Clinical Impressions(s) / ED Diagnoses   Final diagnoses:  Cystitis  Dysuria    New Prescriptions New Prescriptions   CIPROFLOXACIN (CIPRO) 500 MG TABLET    Take 1 tablet (500 mg total) by mouth 2 (two)  times daily.   PHENAZOPYRIDINE (PYRIDIUM) 100 MG TABLET    Take 1 tablet (100 mg total) by mouth 3 (three) times daily as needed for pain.     Merryl Hacker, MD 08/11/17 [phone]

## 2017-08-11 NOTE — Discharge Instructions (Signed)
You were seen today for worsening dysuria. Your urine appears better than prior. However, urine culture is now pending. You will be switched on antibiotics given your persistent symptoms. If you develop fevers or worsening back pain you need to be reevaluated.

## 2017-08-12 LAB — URINE CULTURE: CULTURE: NO GROWTH

## 2017-08-19 ENCOUNTER — Emergency Department (HOSPITAL_BASED_OUTPATIENT_CLINIC_OR_DEPARTMENT_OTHER)
Admission: EM | Admit: 2017-08-19 | Discharge: 2017-08-20 | Disposition: A | Discharge status: Home / Self Care | Payer: Medicaid Other | Attending: Emergency Medicine | Admitting: Emergency Medicine

## 2017-08-19 ENCOUNTER — Encounter (HOSPITAL_BASED_OUTPATIENT_CLINIC_OR_DEPARTMENT_OTHER): Payer: Self-pay | Admitting: *Deleted

## 2017-08-19 DIAGNOSIS — Z5321 Procedure and treatment not carried out due to patient leaving prior to being seen by health care provider: Secondary | ICD-10-CM | POA: Diagnosis not present

## 2017-08-19 DIAGNOSIS — M79606 Pain in leg, unspecified: Secondary | ICD-10-CM | POA: Insufficient documentation

## 2017-08-19 NOTE — ED Triage Notes (Signed)
Pt states she was falling and her legs spreaded apart, pain to rt upper inner thigh area x1wk

## 2017-08-20 NOTE — ED Notes (Signed)
Pt reports "doubling up" on her pain medication that was given last week and no longer has the medication.

## 2017-08-24 ENCOUNTER — Emergency Department (HOSPITAL_BASED_OUTPATIENT_CLINIC_OR_DEPARTMENT_OTHER)
Admission: EM | Admit: 2017-08-24 | Discharge: 2017-08-24 | Disposition: A | Discharge status: Home / Self Care | Payer: Medicaid Other | Attending: Emergency Medicine | Admitting: Emergency Medicine

## 2017-08-24 ENCOUNTER — Encounter (HOSPITAL_BASED_OUTPATIENT_CLINIC_OR_DEPARTMENT_OTHER): Payer: Self-pay | Admitting: Emergency Medicine

## 2017-08-24 DIAGNOSIS — Z79899 Other long term (current) drug therapy: Secondary | ICD-10-CM | POA: Diagnosis not present

## 2017-08-24 DIAGNOSIS — M79604 Pain in right leg: Secondary | ICD-10-CM | POA: Insufficient documentation

## 2017-08-24 DIAGNOSIS — J45909 Unspecified asthma, uncomplicated: Secondary | ICD-10-CM | POA: Diagnosis not present

## 2017-08-24 DIAGNOSIS — Z794 Long term (current) use of insulin: Secondary | ICD-10-CM | POA: Insufficient documentation

## 2017-08-24 DIAGNOSIS — E1140 Type 2 diabetes mellitus with diabetic neuropathy, unspecified: Secondary | ICD-10-CM | POA: Diagnosis not present

## 2017-08-24 NOTE — ED Notes (Signed)
EDP into room, prior to RN assessment, see MD notes, orders received to d/c.   Care assumed at time of d/c. Alert, NAD, calm, interactive, resps e/u, speaking in clear complete sentences, no dyspnea noted, skin W&D, VSS, here for R hamstring pain, reports pulled muscle ~2 weeks ago, ran out of norco, some relief with advil, (denies: numbness, tingling, spasms, urinary sx, fever, NVD, sob, dizziness or visual changes). Family at [facility].

## 2017-08-24 NOTE — ED Provider Notes (Signed)
[place] DEPT [facility] Provider Note   CSN: [id number] Arrival date & time: 08/24/17  2046     History   Chief Complaint Chief Complaint  Patient presents with  . Leg Pain    HPI Lindsay Love is a 36 y.o. female.  HPI Patient presents with concern of ongoing leg pain. Patient notes that 2 weeks ago she accidentally did a splits-like maneuver at home. At that time she felt sudden onset of pain in the right quadriceps and hamstring area. She went to [facility], what was diagnosed with a muscle tear. She notes over the past 2 weeks she has had persistent, worsening pain specifically in the hamstring area worse with motion, ambulation. There is associated discoloration of the medial distal thigh, as well as knee. She remains ambulatory, though she feels better using crutches. No new distal loss of sensation or weakness. No new fall.  Past Medical History:  Diagnosis Date  . Asthma   . Cellulitis of left foot   . Diabetes mellitus without complication ([place])   . IBS (irritable bowel syndrome)     Patient Active Problem List   Diagnosis Date Noted  . Cellulitis of right foot 10/22/2016  . Diabetes ([place]) 10/22/2016  . IBS (irritable bowel syndrome) 10/22/2016  . Asthma 10/22/2016  . Right leg pain 10/22/2016  . Cellulitis and abscess of toe of right foot   . Diabetic peripheral neuropathy ([place])   . Failure of outpatient treatment     Past Surgical History:  Procedure Laterality Date  . CESAREAN SECTION    . CHOLECYSTECTOMY    . CYST EXCISION    . TONSILLECTOMY    . TOOTH EXTRACTION      OB History    No data available       Home Medications    Prior to Admission medications   Medication Sig Start Date End Date Taking? Authorizing Provider  blood glucose meter kit and supplies Dispense based on patient and insurance preference. Use up to four times daily as directed. (FOR ICD-9 250.00, 250.01). 10/25/16   Patrecia Pour, Christean Grief, MD    ciprofloxacin (CIPRO) 500 MG tablet Take 1 tablet (500 mg total) by mouth 2 (two) times daily. 08/11/17   Horton, Barbette Hair, MD  doxycycline (VIBRAMYCIN) 100 MG capsule Take 1 capsule (100 mg total) by mouth 2 (two) times daily. One po bid x 7 days 07/02/17   Malvin Johns, MD  famotidine (PEPCID) 20 MG tablet Take 1 tablet (20 mg total) by mouth 2 (two) times daily. 10/25/16   Doreatha Lew, MD  insulin aspart (NOVOLOG) 100 UNIT/ML injection Inject 35 Units into the skin 2 (two) times daily before a meal. 10/25/16   Patrecia Pour, Christean Grief, MD  insulin detemir (LEVEMIR) 100 UNIT/ML injection Inject 1 mL (100 Units total) into the skin at bedtime. 10/25/16   Doreatha Lew, MD  metFORMIN (GLUCOPHAGE) 500 MG tablet Take 500 mg by mouth 2 (two) times daily with a meal.    [provider]  phenazopyridine (PYRIDIUM) 100 MG tablet Take 1 tablet (100 mg total) by mouth 3 (three) times daily as needed for pain. 08/11/17   Horton, Barbette Hair, MD  Probiotic CAPS Take 1 capsule by mouth daily. 05/25/17   Ward, Delice Bison, DO  traMADol (ULTRAM) 50 MG tablet Take 1 tablet (50 mg total) by mouth every 6 (six) hours as needed. 05/25/17   Ward, Delice Bison, DO    Family History Family History  Problem Relation Age of Onset  . Diabetes Mother   . Diabetes Father     Social History Social History  Substance Use Topics  . Smoking status: Never Smoker  . Smokeless tobacco: Never Used  . Alcohol use No     Allergies   Patient has no known allergies.   Review of Systems Review of Systems  Constitutional:       Per HPI, otherwise negative  HENT:       Per HPI, otherwise negative  Respiratory:       Per HPI, otherwise negative  Cardiovascular:       Per HPI, otherwise negative  Gastrointestinal: Negative for vomiting.  Endocrine:       Negative aside from HPI  Genitourinary:       Neg aside from HPI   Musculoskeletal:       Per HPI, otherwise negative  Skin: Positive for color  change.  Neurological: Negative for syncope.     Physical Exam Updated Vital Signs BP (!) 157/92 (BP Location: Left Arm)   Pulse 90   Temp 98.4 F (36.9 C) (Oral)   Resp 18   Ht _0  (1.702 m)   Wt 97.5 kg (215 lb)   LMP 08/18/2017   SpO2 100%   BMI 33.67 kg/m   Physical Exam  Constitutional: She is oriented to person, place, and time. She appears well-developed and well-nourished. No distress.  HENT:  Head: Normocephalic and atraumatic.  Eyes: Conjunctivae and EOM are normal.  Cardiovascular: Normal rate and regular rhythm.   Pulmonary/Chest: Effort normal and breath sounds normal. No stridor. No respiratory distress.  Abdominal: She exhibits no distension.  Musculoskeletal: She exhibits no edema.  Pelvis stable, patient flexes and extends spontaneously. There is pain with knee flexion against resistance, minimal pain with knee extension against resistance. Range of motion is somewhat limited secondary to pain in the thigh.   Neurological: She is alert and oriented to person, place, and time. No cranial nerve deficit.  Skin: Skin is warm and dry.     Psychiatric: She has a normal mood and affect.  Nursing note and vitals reviewed.    ED Treatments / Results   Procedures Procedures (including critical care time)  Medications Ordered in ED Medications - No data to display   Initial Impression / Assessment and Plan / ED Course  I have reviewed the triage vital signs and the nursing notes.  Pertinent labs & imaging results that were available during my care of the patient were reviewed by me and considered in my medical decision making (see chart for details).  Well-appearing female presents several weeks after initial injury, now with ongoing pain, discoloration about the right medial 5. No evidence for DVT, no distal loss of sensation, symptoms likely resolving muscle tear, and with no red flags, the patient was advised to continue using NSAIDs, ice, crutches,  follow up with orthopedic.  Final Clinical Impressions(s) / ED Diagnoses  Right leg pain   Carmin Muskrat, MD 08/24/17 2219

## 2017-08-24 NOTE — Discharge Instructions (Signed)
As discussed, your evaluation today has been largely reassuring.  But, it is important that you monitor your condition carefully, and do not hesitate to return to the ED if you develop new, or concerning changes in your condition. ? ?Otherwise, please follow-up with your physician for appropriate ongoing care. ? ?

## 2017-08-24 NOTE — ED Triage Notes (Signed)
PT presents with c/o right left pain . PT states she did a split about 2 weeks ago and went to [facility] and was told she tore her hamstring and was sent home with pain meds . But now is concerned because the bruising is worse and spreading to her knee and pain is worse.

## 2017-08-24 NOTE — ED Notes (Signed)
Ambulatory from triage to exam room, steady gait. Alert, NAD, calm, interactive, resps e/u, speaking in clear complete sentences, no dyspnea noted. EDP into room. Family at [facility].

## 2017-10-27 ENCOUNTER — Other Ambulatory Visit: Payer: Self-pay

## 2017-10-27 ENCOUNTER — Emergency Department (HOSPITAL_BASED_OUTPATIENT_CLINIC_OR_DEPARTMENT_OTHER): Payer: Medicaid Other

## 2017-10-27 ENCOUNTER — Encounter (HOSPITAL_BASED_OUTPATIENT_CLINIC_OR_DEPARTMENT_OTHER): Payer: Self-pay | Admitting: Emergency Medicine

## 2017-10-27 ENCOUNTER — Inpatient Hospital Stay (HOSPITAL_BASED_OUTPATIENT_CLINIC_OR_DEPARTMENT_OTHER)
Admission: EM | Admit: 2017-10-27 | Discharge: 2017-11-02 | DRG: 240 | Disposition: A | Discharge status: Home / Self Care | Payer: Medicaid Other | Attending: Internal Medicine | Admitting: Internal Medicine

## 2017-10-27 DIAGNOSIS — E1165 Type 2 diabetes mellitus with hyperglycemia: Secondary | ICD-10-CM | POA: Diagnosis present

## 2017-10-27 DIAGNOSIS — J069 Acute upper respiratory infection, unspecified: Secondary | ICD-10-CM | POA: Diagnosis present

## 2017-10-27 DIAGNOSIS — L03119 Cellulitis of unspecified part of limb: Secondary | ICD-10-CM | POA: Diagnosis not present

## 2017-10-27 DIAGNOSIS — J4520 Mild intermittent asthma, uncomplicated: Secondary | ICD-10-CM | POA: Diagnosis not present

## 2017-10-27 DIAGNOSIS — M86171 Other acute osteomyelitis, right ankle and foot: Secondary | ICD-10-CM | POA: Diagnosis present

## 2017-10-27 DIAGNOSIS — E872 Acidosis, unspecified: Secondary | ICD-10-CM | POA: Diagnosis present

## 2017-10-27 DIAGNOSIS — L089 Local infection of the skin and subcutaneous tissue, unspecified: Secondary | ICD-10-CM

## 2017-10-27 DIAGNOSIS — L03031 Cellulitis of right toe: Secondary | ICD-10-CM | POA: Diagnosis not present

## 2017-10-27 DIAGNOSIS — Z79899 Other long term (current) drug therapy: Secondary | ICD-10-CM

## 2017-10-27 DIAGNOSIS — Z6836 Body mass index (BMI) 36.0-36.9, adult: Secondary | ICD-10-CM

## 2017-10-27 DIAGNOSIS — J45909 Unspecified asthma, uncomplicated: Secondary | ICD-10-CM | POA: Diagnosis present

## 2017-10-27 DIAGNOSIS — E871 Hypo-osmolality and hyponatremia: Secondary | ICD-10-CM | POA: Diagnosis present

## 2017-10-27 DIAGNOSIS — G2581 Restless legs syndrome: Secondary | ICD-10-CM | POA: Diagnosis present

## 2017-10-27 DIAGNOSIS — Z794 Long term (current) use of insulin: Secondary | ICD-10-CM | POA: Diagnosis not present

## 2017-10-27 DIAGNOSIS — E118 Type 2 diabetes mellitus with unspecified complications: Secondary | ICD-10-CM | POA: Diagnosis not present

## 2017-10-27 DIAGNOSIS — E669 Obesity, unspecified: Secondary | ICD-10-CM | POA: Diagnosis present

## 2017-10-27 DIAGNOSIS — A419 Sepsis, unspecified organism: Secondary | ICD-10-CM

## 2017-10-27 DIAGNOSIS — K589 Irritable bowel syndrome without diarrhea: Secondary | ICD-10-CM | POA: Diagnosis present

## 2017-10-27 DIAGNOSIS — E11621 Type 2 diabetes mellitus with foot ulcer: Secondary | ICD-10-CM | POA: Diagnosis present

## 2017-10-27 DIAGNOSIS — E1169 Type 2 diabetes mellitus with other specified complication: Secondary | ICD-10-CM | POA: Diagnosis present

## 2017-10-27 DIAGNOSIS — E08621 Diabetes mellitus due to underlying condition with foot ulcer: Secondary | ICD-10-CM | POA: Diagnosis not present

## 2017-10-27 DIAGNOSIS — R739 Hyperglycemia, unspecified: Secondary | ICD-10-CM | POA: Diagnosis present

## 2017-10-27 DIAGNOSIS — L03115 Cellulitis of right lower limb: Secondary | ICD-10-CM | POA: Diagnosis present

## 2017-10-27 DIAGNOSIS — E1152 Type 2 diabetes mellitus with diabetic peripheral angiopathy with gangrene: Secondary | ICD-10-CM | POA: Diagnosis present

## 2017-10-27 DIAGNOSIS — E11628 Type 2 diabetes mellitus with other skin complications: Secondary | ICD-10-CM | POA: Diagnosis present

## 2017-10-27 DIAGNOSIS — IMO0002 Reserved for concepts with insufficient information to code with codable children: Secondary | ICD-10-CM

## 2017-10-27 DIAGNOSIS — K219 Gastro-esophageal reflux disease without esophagitis: Secondary | ICD-10-CM | POA: Diagnosis present

## 2017-10-27 DIAGNOSIS — E1142 Type 2 diabetes mellitus with diabetic polyneuropathy: Secondary | ICD-10-CM | POA: Diagnosis present

## 2017-10-27 DIAGNOSIS — B3731 Acute candidiasis of vulva and vagina: Secondary | ICD-10-CM | POA: Diagnosis present

## 2017-10-27 DIAGNOSIS — B373 Candidiasis of vulva and vagina: Secondary | ICD-10-CM | POA: Diagnosis present

## 2017-10-27 DIAGNOSIS — L97511 Non-pressure chronic ulcer of other part of right foot limited to breakdown of skin: Secondary | ICD-10-CM | POA: Diagnosis not present

## 2017-10-27 DIAGNOSIS — M869 Osteomyelitis, unspecified: Secondary | ICD-10-CM

## 2017-10-27 DIAGNOSIS — L97519 Non-pressure chronic ulcer of other part of right foot with unspecified severity: Secondary | ICD-10-CM | POA: Diagnosis present

## 2017-10-27 DIAGNOSIS — E876 Hypokalemia: Secondary | ICD-10-CM | POA: Diagnosis not present

## 2017-10-27 HISTORY — DX: Restless legs syndrome: G25.81

## 2017-10-27 HISTORY — DX: Gastro-esophageal reflux disease without esophagitis: K21.9

## 2017-10-27 LAB — CBC WITH DIFFERENTIAL/PLATELET
BASOS ABS: 0 10*3/uL (ref 0.0–0.1)
BASOS ABS: 0 10*3/uL (ref 0.0–0.1)
BASOS PCT: 1 %
Basophils Relative: 1 %
EOS ABS: 0.2 10*3/uL (ref 0.0–0.7)
EOS ABS: 0.2 10*3/uL (ref 0.0–0.7)
EOS PCT: 3 %
EOS PCT: 3 %
HCT: 31.5 % — ABNORMAL LOW (ref 36.0–46.0)
HEMATOCRIT: 36.1 % (ref 36.0–46.0)
HEMOGLOBIN: 12.4 g/dL (ref 12.0–15.0)
Hemoglobin: 10.7 g/dL — ABNORMAL LOW (ref 12.0–15.0)
LYMPHS PCT: 29 %
Lymphocytes Relative: 31 %
Lymphs Abs: 2.3 10*3/uL (ref 0.7–4.0)
Lymphs Abs: 2.4 10*3/uL (ref 0.7–4.0)
MCH: 28.3 pg (ref 26.0–34.0)
MCH: 28.4 pg (ref 26.0–34.0)
MCHC: 34.3 g/dL (ref 30.0–36.0)
MCHC: 34 g/dL (ref 30.0–36.0)
MCV: 82.4 fL (ref 78.0–100.0)
MCV: 83.6 fL (ref 78.0–100.0)
MONO ABS: 0.5 10*3/uL (ref 0.1–1.0)
Monocytes Absolute: 0.5 10*3/uL (ref 0.1–1.0)
Monocytes Relative: 7 %
Monocytes Relative: 6 %
Neutro Abs: 4.8 10*3/uL (ref 1.7–7.7)
Neutro Abs: 4.6 10*3/uL (ref 1.7–7.7)
Neutrophils Relative %: 61 %
Neutrophils Relative %: 59 %
PLATELETS: 312 10*3/uL (ref 150–400)
Platelets: 385 10*3/uL (ref 150–400)
RBC: 4.38 MIL/uL (ref 3.87–5.11)
RBC: 3.77 MIL/uL — AB (ref 3.87–5.11)
RDW: 13.9 % (ref 11.5–15.5)
RDW: 14 % (ref 11.5–15.5)
WBC: 8 10*3/uL (ref 4.0–10.5)
WBC: 7.8 10*3/uL (ref 4.0–10.5)

## 2017-10-27 LAB — COMPREHENSIVE METABOLIC PANEL
ALK PHOS: 83 U/L (ref 38–126)
ALK PHOS: 70 U/L (ref 38–126)
ALT: 13 U/L — ABNORMAL LOW (ref 14–54)
ALT: 12 U/L — ABNORMAL LOW (ref 14–54)
ANION GAP: 9 (ref 5–15)
ANION GAP: 4 — AB (ref 5–15)
AST: 23 U/L (ref 15–41)
AST: 17 U/L (ref 15–41)
Albumin: 3 g/dL — ABNORMAL LOW (ref 3.5–5.0)
Albumin: 2.5 g/dL — ABNORMAL LOW (ref 3.5–5.0)
BILIRUBIN TOTAL: 0.5 mg/dL (ref 0.3–1.2)
BUN: 15 mg/dL (ref 6–20)
BUN: 9 mg/dL (ref 6–20)
CALCIUM: 8.7 mg/dL — AB (ref 8.9–10.3)
CALCIUM: 7.9 mg/dL — AB (ref 8.9–10.3)
CO2: 23 mmol/L (ref 22–32)
CO2: 24 mmol/L (ref 22–32)
Chloride: 94 mmol/L — ABNORMAL LOW (ref 101–111)
Chloride: 106 mmol/L (ref 101–111)
Creatinine, Ser: 0.96 mg/dL (ref 0.44–1.00)
Creatinine, Ser: 0.68 mg/dL (ref 0.44–1.00)
GFR calc non Af Amer: >60 mL/min (ref >60)
GFR calc non Af Amer: >60 mL/min (ref >60)
Glucose, Bld: 594 mg/dL (ref 65–99)
Glucose, Bld: 401 mg/dL — ABNORMAL HIGH (ref 65–99)
Potassium: 3.8 mmol/L (ref 3.5–5.1)
Potassium: 4.2 mmol/L (ref 3.5–5.1)
SODIUM: 126 mmol/L — AB (ref 135–145)
SODIUM: 134 mmol/L — AB (ref 135–145)
TOTAL PROTEIN: 7.8 g/dL (ref 6.5–8.1)
TOTAL PROTEIN: 6.8 g/dL (ref 6.5–8.1)
Total Bilirubin: 0.4 mg/dL (ref 0.3–1.2)

## 2017-10-27 LAB — URINALYSIS, ROUTINE W REFLEX MICROSCOPIC
Bilirubin Urine: NEGATIVE
Ketones, ur: NEGATIVE mg/dL
Leukocytes, UA: NEGATIVE
Nitrite: NEGATIVE
PH: 6 (ref 5.0–8.0)
PROTEIN: NEGATIVE mg/dL
Specific Gravity, Urine: 1.01 (ref 1.005–1.030)

## 2017-10-27 LAB — I-STAT CG4 LACTIC ACID, ED
LACTIC ACID, VENOUS: 4.36 mmol/L — AB (ref 0.5–1.9)
Lactic Acid, Venous: 1.48 mmol/L (ref 0.5–1.9)

## 2017-10-27 LAB — PROTIME-INR
INR: 1.56
INR: 0.96
PROTHROMBIN TIME: 12.7 s (ref 11.4–15.2)
Prothrombin Time: 18.5 seconds — ABNORMAL HIGH (ref 11.4–15.2)

## 2017-10-27 LAB — CBG MONITORING, ED
GLUCOSE-CAPILLARY: 501 mg/dL — AB (ref 65–99)
GLUCOSE-CAPILLARY: 426 mg/dL — AB (ref 65–99)
Glucose-Capillary: 446 mg/dL — ABNORMAL HIGH (ref 65–99)
Glucose-Capillary: 433 mg/dL — ABNORMAL HIGH (ref 65–99)
Glucose-Capillary: 356 mg/dL — ABNORMAL HIGH (ref 65–99)

## 2017-10-27 LAB — URINALYSIS, MICROSCOPIC (REFLEX)

## 2017-10-27 LAB — GLUCOSE, CAPILLARY
GLUCOSE-CAPILLARY: 432 mg/dL — AB (ref 65–99)
Glucose-Capillary: 368 mg/dL — ABNORMAL HIGH (ref 65–99)

## 2017-10-27 LAB — PREGNANCY, URINE: Preg Test, Ur: NEGATIVE

## 2017-10-27 LAB — APTT: APTT: 34 s (ref 24–36)

## 2017-10-27 LAB — MAGNESIUM: Magnesium: 1.8 mg/dL (ref 1.7–2.4)

## 2017-10-27 LAB — PROCALCITONIN

## 2017-10-27 LAB — PHOSPHORUS: PHOSPHORUS: 2.2 mg/dL — AB (ref 2.5–4.6)

## 2017-10-27 MED ORDER — SODIUM CHLORIDE 0.9 % IV SOLN: INTRAVENOUS | Status: DC

## 2017-10-27 MED ORDER — ALBUTEROL SULFATE (2.5 MG/3ML) 0.083% IN NEBU: 2.5000 mg | INHALATION_SOLUTION | RESPIRATORY_TRACT | Status: DC | PRN

## 2017-10-27 MED ORDER — MOMETASONE FURO-FORMOTEROL FUM 200-5 MCG/ACT IN AERO
2.0000 | INHALATION_SPRAY | Freq: Two times a day (BID) | RESPIRATORY_TRACT | Status: DC
  Administered 2017-10-28 – 2017-11-01 (×6): 2 via RESPIRATORY_TRACT
  Filled 2017-10-27 (×2): qty 8.8

## 2017-10-27 MED ORDER — CLOTRIMAZOLE 1 % VA CREA
1.0000 | TOPICAL_CREAM | Freq: Every day | VAGINAL | Status: DC
Start: 2017-10-27 — End: 2017-11-02
  Administered 2017-10-27 – 2017-10-31 (×5): 1 via VAGINAL
  Filled 2017-10-27 (×3): qty 45

## 2017-10-27 MED ORDER — CIPROFLOXACIN IN D5W 400 MG/200ML IV SOLN
400.0000 mg | Freq: Two times a day (BID) | INTRAVENOUS | Status: DC
  Administered 2017-10-27 – 2017-10-28 (×2): 400 mg via INTRAVENOUS
  Filled 2017-10-27 (×2): qty 200

## 2017-10-27 MED ORDER — VANCOMYCIN HCL IN DEXTROSE 1-5 GM/200ML-% IV SOLN
1000.0000 mg | Freq: Once | INTRAVENOUS | Status: AC
  Administered 2017-10-27: 1000 mg via INTRAVENOUS
  Filled 2017-10-27: qty 200

## 2017-10-27 MED ORDER — ONDANSETRON HCL 4 MG/2ML IJ SOLN
4.0000 mg | Freq: Once | INTRAMUSCULAR | Status: AC
  Administered 2017-10-27: 4 mg via INTRAVENOUS
  Filled 2017-10-27: qty 2

## 2017-10-27 MED ORDER — FLUCONAZOLE 100 MG PO TABS
100.0000 mg | ORAL_TABLET | Freq: Every day | ORAL | Status: DC
  Administered 2017-10-28 – 2017-11-02 (×6): 100 mg via ORAL
  Filled 2017-10-27 (×6): qty 1

## 2017-10-27 MED ORDER — INSULIN ASPART 100 UNIT/ML ~~LOC~~ SOLN
10.0000 [IU] | Freq: Three times a day (TID) | SUBCUTANEOUS | Status: DC
  Administered 2017-10-27: 10 [IU] via SUBCUTANEOUS

## 2017-10-27 MED ORDER — KETOROLAC TROMETHAMINE 30 MG/ML IJ SOLN
30.0000 mg | Freq: Four times a day (QID) | INTRAMUSCULAR | Status: AC | PRN
  Administered 2017-10-30 (×2): 30 mg via INTRAVENOUS
  Filled 2017-10-27 (×3): qty 1

## 2017-10-27 MED ORDER — SODIUM CHLORIDE 0.9 % IV BOLUS (SEPSIS)
1000.0000 mL | Freq: Once | INTRAVENOUS | Status: AC
  Administered 2017-10-27: 1000 mL via INTRAVENOUS

## 2017-10-27 MED ORDER — PHENAZOPYRIDINE HCL 100 MG PO TABS: 100.0000 mg | ORAL_TABLET | Freq: Three times a day (TID) | ORAL | Status: DC

## 2017-10-27 MED ORDER — INSULIN ASPART 100 UNIT/ML ~~LOC~~ SOLN
0.0000 [IU] | Freq: Three times a day (TID) | SUBCUTANEOUS | Status: DC
  Administered 2017-10-28: 7 [IU] via SUBCUTANEOUS
  Administered 2017-10-28: 11 [IU] via SUBCUTANEOUS
  Administered 2017-10-28 – 2017-10-29 (×2): 15 [IU] via SUBCUTANEOUS
  Administered 2017-10-29: 3 [IU] via SUBCUTANEOUS
  Administered 2017-10-29: 15 [IU] via SUBCUTANEOUS
  Administered 2017-10-30: 7 [IU] via SUBCUTANEOUS
  Administered 2017-10-30: 11 [IU] via SUBCUTANEOUS
  Administered 2017-10-30: 7 [IU] via SUBCUTANEOUS
  Administered 2017-10-31 (×3): 3 [IU] via SUBCUTANEOUS
  Administered 2017-11-01 (×2): 4 [IU] via SUBCUTANEOUS
  Administered 2017-11-01 – 2017-11-02 (×2): 11 [IU] via SUBCUTANEOUS
  Administered 2017-11-02: 7 [IU] via SUBCUTANEOUS

## 2017-10-27 MED ORDER — ZOLPIDEM TARTRATE 5 MG PO TABS
5.0000 mg | ORAL_TABLET | Freq: Every evening | ORAL | Status: DC | PRN
  Filled 2017-10-27: qty 1

## 2017-10-27 MED ORDER — ACETAMINOPHEN 325 MG PO TABS
650.0000 mg | ORAL_TABLET | Freq: Four times a day (QID) | ORAL | Status: DC | PRN
  Administered 2017-10-27 – 2017-10-28 (×3): 650 mg via ORAL
  Filled 2017-10-27 (×3): qty 2

## 2017-10-27 MED ORDER — HYDROCODONE-HOMATROPINE 5-1.5 MG/5ML PO SYRP: 5.0000 mL | ORAL_SOLUTION | Freq: Four times a day (QID) | ORAL | Status: DC | PRN

## 2017-10-27 MED ORDER — ONDANSETRON HCL 4 MG PO TABS: 4.0000 mg | ORAL_TABLET | Freq: Four times a day (QID) | ORAL | Status: DC | PRN

## 2017-10-27 MED ORDER — INSULIN ASPART 100 UNIT/ML ~~LOC~~ SOLN
0.0000 [IU] | Freq: Three times a day (TID) | SUBCUTANEOUS | Status: DC
  Administered 2017-10-27 (×3): 9 [IU] via SUBCUTANEOUS
  Filled 2017-10-27 (×2): qty 1

## 2017-10-27 MED ORDER — PIPERACILLIN-TAZOBACTAM 3.375 G IVPB 30 MIN
3.3750 g | Freq: Once | INTRAVENOUS | Status: AC
  Administered 2017-10-27: 3.375 g via INTRAVENOUS
  Filled 2017-10-27 (×2): qty 50

## 2017-10-27 MED ORDER — SENNA 8.6 MG PO TABS
1.0000 | ORAL_TABLET | Freq: Two times a day (BID) | ORAL | Status: DC
  Administered 2017-10-28 – 2017-11-02 (×9): 8.6 mg via ORAL
  Filled 2017-10-27 (×10): qty 1

## 2017-10-27 MED ORDER — FLUTICASONE PROPIONATE 50 MCG/ACT NA SUSP
2.0000 | Freq: Every day | NASAL | Status: DC
  Administered 2017-10-28: 2 via NASAL
  Filled 2017-10-27: qty 16

## 2017-10-27 MED ORDER — PROBIOTIC PO CAPS: 1.0000 | ORAL_CAPSULE | Freq: Every day | ORAL | Status: DC

## 2017-10-27 MED ORDER — GABAPENTIN 300 MG PO CAPS
600.0000 mg | ORAL_CAPSULE | Freq: Two times a day (BID) | ORAL | Status: DC
  Administered 2017-10-27 – 2017-11-02 (×12): 600 mg via ORAL
  Filled 2017-10-27 (×12): qty 2

## 2017-10-27 MED ORDER — SORBITOL 70 % SOLN
30.0000 mL | Freq: Every day | Status: DC | PRN
  Filled 2017-10-27: qty 30

## 2017-10-27 MED ORDER — INSULIN DETEMIR 100 UNIT/ML ~~LOC~~ SOLN
50.0000 [IU] | Freq: Two times a day (BID) | SUBCUTANEOUS | Status: DC
  Administered 2017-10-27 – 2017-10-28 (×2): 50 [IU] via SUBCUTANEOUS
  Filled 2017-10-27 (×3): qty 0.5

## 2017-10-27 MED ORDER — ALBUTEROL SULFATE (2.5 MG/3ML) 0.083% IN NEBU
2.5000 mg | INHALATION_SOLUTION | Freq: Four times a day (QID) | RESPIRATORY_TRACT | Status: DC
  Administered 2017-10-27: 2.5 mg via RESPIRATORY_TRACT
  Filled 2017-10-27: qty 3

## 2017-10-27 MED ORDER — ENOXAPARIN SODIUM 40 MG/0.4ML ~~LOC~~ SOLN
40.0000 mg | SUBCUTANEOUS | Status: DC
  Administered 2017-10-27 – 2017-11-01 (×6): 40 mg via SUBCUTANEOUS
  Filled 2017-10-27 (×6): qty 0.4

## 2017-10-27 MED ORDER — OXYCODONE-ACETAMINOPHEN 5-325 MG PO TABS
1.0000 | ORAL_TABLET | ORAL | Status: DC | PRN
  Administered 2017-10-27 – 2017-10-30 (×2): 1 via ORAL
  Filled 2017-10-27 (×2): qty 1

## 2017-10-27 MED ORDER — SODIUM CHLORIDE 0.9 % IV BOLUS (SEPSIS): 1000.0000 mL | Freq: Once | INTRAVENOUS | Status: DC

## 2017-10-27 MED ORDER — MORPHINE SULFATE (PF) 4 MG/ML IV SOLN
4.0000 mg | Freq: Once | INTRAVENOUS | Status: AC
  Administered 2017-10-27: 4 mg via INTRAVENOUS
  Filled 2017-10-27: qty 1

## 2017-10-27 MED ORDER — ONDANSETRON HCL 4 MG/2ML IJ SOLN
4.0000 mg | Freq: Three times a day (TID) | INTRAMUSCULAR | Status: DC | PRN
  Administered 2017-10-27 (×2): 4 mg via INTRAVENOUS
  Filled 2017-10-27 (×2): qty 2

## 2017-10-27 MED ORDER — INSULIN ASPART 100 UNIT/ML ~~LOC~~ SOLN
0.0000 [IU] | Freq: Every day | SUBCUTANEOUS | Status: DC
  Administered 2017-10-27: 5 [IU] via SUBCUTANEOUS
  Administered 2017-10-28: 2 [IU] via SUBCUTANEOUS
  Administered 2017-11-01: 4 [IU] via SUBCUTANEOUS

## 2017-10-27 MED ORDER — FLUCONAZOLE IN SODIUM CHLORIDE 200-0.9 MG/100ML-% IV SOLN
200.0000 mg | Freq: Once | INTRAVENOUS | Status: AC
  Administered 2017-10-27: 200 mg via INTRAVENOUS
  Filled 2017-10-27: qty 100

## 2017-10-27 MED ORDER — ENOXAPARIN SODIUM 40 MG/0.4ML ~~LOC~~ SOLN: 40.0000 mg | SUBCUTANEOUS | Status: DC

## 2017-10-27 MED ORDER — MAGNESIUM CITRATE PO SOLN: 1.0000 | Freq: Once | ORAL | Status: DC | PRN

## 2017-10-27 MED ORDER — VANCOMYCIN HCL IN DEXTROSE 1-5 GM/200ML-% IV SOLN
1000.0000 mg | Freq: Once | INTRAVENOUS | Status: AC
  Administered 2017-10-27: 1000 mg via INTRAVENOUS

## 2017-10-27 MED ORDER — INSULIN DETEMIR 100 UNIT/ML ~~LOC~~ SOLN
70.0000 [IU] | Freq: Every day | SUBCUTANEOUS | Status: DC
  Filled 2017-10-27: qty 0.7

## 2017-10-27 MED ORDER — POTASSIUM PHOSPHATES 15 MMOLE/5ML IV SOLN
30.0000 mmol | Freq: Once | INTRAVENOUS | Status: AC
  Administered 2017-10-28: 30 mmol via INTRAVENOUS
  Filled 2017-10-27: qty 10

## 2017-10-27 MED ORDER — FAMOTIDINE 20 MG PO TABS
20.0000 mg | ORAL_TABLET | Freq: Two times a day (BID) | ORAL | Status: DC
  Administered 2017-10-27 – 2017-11-02 (×13): 20 mg via ORAL
  Filled 2017-10-27 (×13): qty 1

## 2017-10-27 MED ORDER — VANCOMYCIN HCL IN DEXTROSE 1-5 GM/200ML-% IV SOLN: 1000.0000 mg | Freq: Once | INTRAVENOUS | Status: DC

## 2017-10-27 MED ORDER — GUAIFENESIN ER 600 MG PO TB12: 1200.0000 mg | ORAL_TABLET | Freq: Two times a day (BID) | ORAL | Status: DC

## 2017-10-27 MED ORDER — LORATADINE 10 MG PO TABS
10.0000 mg | ORAL_TABLET | Freq: Every day | ORAL | Status: DC
  Administered 2017-10-27 – 2017-11-02 (×7): 10 mg via ORAL
  Filled 2017-10-27 (×7): qty 1

## 2017-10-27 MED ORDER — GUAIFENESIN ER 600 MG PO TB12
1200.0000 mg | ORAL_TABLET | Freq: Two times a day (BID) | ORAL | Status: DC
  Administered 2017-10-27 – 2017-11-02 (×12): 1200 mg via ORAL
  Filled 2017-10-27 (×12): qty 2

## 2017-10-27 MED ORDER — ONDANSETRON HCL 4 MG/2ML IJ SOLN: 4.0000 mg | Freq: Four times a day (QID) | INTRAMUSCULAR | Status: DC | PRN

## 2017-10-27 MED ORDER — POLYETHYLENE GLYCOL 3350 17 G PO PACK: 17.0000 g | PACK | Freq: Every day | ORAL | Status: DC | PRN

## 2017-10-27 MED ORDER — SODIUM CHLORIDE 0.9 % IV SOLN
INTRAVENOUS | Status: DC
  Administered 2017-10-27: 100 mL/h via INTRAVENOUS
  Administered 2017-10-28 – 2017-10-29 (×3): via INTRAVENOUS

## 2017-10-27 MED ORDER — VANCOMYCIN HCL IN DEXTROSE 1-5 GM/200ML-% IV SOLN
1000.0000 mg | Freq: Three times a day (TID) | INTRAVENOUS | Status: DC
  Administered 2017-10-27 – 2017-10-28 (×2): 1000 mg via INTRAVENOUS
  Filled 2017-10-27 (×2): qty 200

## 2017-10-27 MED ORDER — INSULIN REGULAR HUMAN 100 UNIT/ML IJ SOLN
10.0000 [IU] | Freq: Once | INTRAMUSCULAR | Status: AC
  Administered 2017-10-27: 10 [IU] via INTRAVENOUS
  Filled 2017-10-27: qty 1

## 2017-10-27 NOTE — Progress Notes (Signed)
Patient CBG 432, had large meal from outside hospital. [facility] Order for 6 units coverage. Given per orders. Will c/t monitor.

## 2017-10-27 NOTE — ED Triage Notes (Signed)
Pt presents with c/o right foot pain and infection. PT states the wound has been there for a few months. Pt also reports cough

## 2017-10-27 NOTE — H&P (Signed)
History and Physical    Lindsay Love:[id number] DOB: [birth year] DOA: 10/27/2017  PCP: Patient, No Pcp Per (Confirm with patient/family/NH records and if not entered, this has to be entered at [facility] point of entry) Patient coming from: Home  I have personally briefly reviewed patient's old medical records in [place]  Chief Complaint: Right foot pain  HPI: Lindsay Love is a 36 y.o. female with medical history significant of poorly controlled diabetes mellitus with neuropathy, asthma, restless leg syndrome, gastroesophageal reflux disease, IBS who presented to the med center freestanding ED complains of a 3-day history of worsening right foot and right fifth toe pain with increasing erythema, warmth.  Patient also endorses a 10-day history of upper respiratory symptoms including a cough, congestion, intermittent shortness of breath, intermittent wheezing.  Patient states has had a wound on the lateral portion of her right fifth toe for approximately 2-3 months which she had been managing at home with over-the-counter Neosporin and daily cleaning.  Patient does state over the past 3 days started to notice some redness, swelling, pain shooting in her right foot.  Patient endorses some subjective fevers, chills, nonproductive cough, congestion, constipation times 2 weeks, some shortness of breath, wheezing, serosanguineous drainage from wound on lateral fifth right foot toe.  Patient denies any nausea, no vomiting, no abdominal pain, no diarrhea, no melena, no hematemesis, no hematochezia.  Patient also complaining of vaginal candidiasis.  ED Course: Patient seen in the ED, noted to have a lactic acid level of 4.36.  Compressive metabolic profile had a sodium of 126, chloride of 94, glucose of 594, calcium of 8.7 otherwise is within normal limits.  Pro-calcitonin was less than 0.10.  CBC was unremarkable.  INR was 1.56.  Urinalysis had greater than 500 glucose, moderate hemoglobin, leukocytes  negative, nitrite negative, 0-5 WBCs.  Urine pregnancy test was negative.  Chest x-ray negative for any active cardiopulmonary disease.  Films of the right foot showed soft tissue ulceration at the lateral margin of the right fifth PIP joint.  No evidence of associated osteomyelitis.  No other acute abnormality identified about the foot.  EKG showed a sinus tachycardia.  Patient was given a dose of IV vancomycin IV Zosyn in the ED.  Review of Systems: As per HPI otherwise 10 point review of systems negative.   Past Medical History:  Diagnosis Date  . Asthma   . Cellulitis of left foot   . Diabetes mellitus without complication ([place])   . GERD (gastroesophageal reflux disease) 10/27/2017  . IBS (irritable bowel syndrome)   . RLS (restless legs syndrome) 10/27/2017    Past Surgical History:  Procedure Laterality Date  . CESAREAN SECTION    . CHOLECYSTECTOMY    . CYST EXCISION    . TONSILLECTOMY    . TOOTH EXTRACTION       reports that  has never smoked. she has never used smokeless tobacco. She reports that she does not drink alcohol or use drugs.  Allergies  Allergen Reactions  . Morphine And Related Nausea And Vomiting    Family History  Problem Relation Age of Onset  . Diabetes Mother   . Cancer Mother   . Irritable bowel syndrome Mother   . Hypertension Mother   . Sleep apnea Mother   . Diabetes Father   . Restless legs syndrome Father    Mother alive age 92 with a history of breast cancer and lung cancer, history of IBS, diabetes, hypertension, obstructive sleep apnea.  Father  alive age 75 history of diabetes, restless leg syndrome.  Prior to Admission medications   Medication Sig Start Date End Date Taking? Authorizing Provider  albuterol (PROVENTIL HFA;VENTOLIN HFA) 108 (90 Base) MCG/ACT inhaler Inhale 2 puffs into the lungs every 6 (six) hours as needed for wheezing or shortness of breath.   Yes [provider]  blood glucose meter kit and supplies Dispense  based on patient and insurance preference. Use up to four times daily as directed. (FOR ICD-9 250.00, 250.01). 10/25/16  Yes Patrecia Pour, Christean Grief, MD  famotidine (PEPCID) 20 MG tablet Take 1 tablet (20 mg total) by mouth 2 (two) times daily. 10/25/16  Yes Patrecia Pour, Christean Grief, MD  gabapentin (NEURONTIN) 300 MG capsule Take 600 mg by mouth 2 (two) times daily.   Yes [provider]  ibuprofen (ADVIL,MOTRIN) 200 MG tablet Take 600 mg by mouth every 6 (six) hours as needed for headache or moderate pain.   Yes [provider]  insulin aspart (NOVOLOG) 100 UNIT/ML injection Inject 35 Units into the skin 2 (two) times daily before a meal. Patient taking differently: Inject 35 Units into the skin 3 (three) times daily with meals.  10/25/16  Yes Patrecia Pour, Christean Grief, MD  insulin glargine (LANTUS) 100 UNIT/ML injection Inject 100 Units into the skin daily.   Yes [provider]  ketotifen (ZADITOR) 0.025 % ophthalmic solution Place 1 drop into both eyes as needed (allergies).   Yes [provider]  metFORMIN (GLUCOPHAGE) 500 MG tablet Take 500 mg by mouth 2 (two) times daily with a meal.   Yes [provider]  phenazopyridine (PYRIDIUM) 100 MG tablet Take 1 tablet (100 mg total) by mouth 3 (three) times daily as needed for pain. 08/11/17  Yes Horton, Barbette Hair, MD  insulin detemir (LEVEMIR) 100 UNIT/ML injection Inject 1 mL (100 Units total) into the skin at bedtime. Patient not taking: Reported on 10/27/2017 10/25/16   Patrecia Pour, Christean Grief, MD    Physical Exam: Vitals:   10/27/17 1330 10/27/17 1338 10/27/17 1547 10/27/17 1721  BP:  (!) 130/95 133/89   Pulse: 92 98 96   Resp:  18 18   Temp:  98.1 F (36.7 C)    TempSrc:  Oral    SpO2: 92% 94% 98%   Weight:    103.9 kg (229 lb 0.9 oz)  Height:    _0  (1.702 m)    Constitutional: NAD, calm, comfortable Vitals:   10/27/17 1330 10/27/17 1338 10/27/17 1547 10/27/17 1721  BP:  (!) 130/95 133/89   Pulse: 92  98 96   Resp:  18 18   Temp:  98.1 F (36.7 C)    TempSrc:  Oral    SpO2: 92% 94% 98%   Weight:    103.9 kg (229 lb 0.9 oz)  Height:    _1  (1.702 m)   Eyes: PERRLA, EOMI, lids and conjunctivae normal ENMT: Mucous membranes are moist. Posterior pharynx clear of any exudate or lesions.Normal dentition.  Neck: normal, supple, no masses, no thyromegaly Respiratory: clear to auscultation bilaterally, no wheezing, no crackles. Normal respiratory effort. No accessory muscle use.  Cardiovascular: Regular rate and rhythm, no murmurs / rubs / gallops.  Right lower extremity with 1-2+ edema. 2+ pedal pulses. No carotid bruits.  Abdomen: no tenderness, no masses palpated. No hepatosplenomegaly. Bowel sounds positive.  Musculoskeletal: no clubbing / cyanosis. No joint deformity upper and lower extremities. Good ROM, no contractures. Normal muscle tone.  Skin: Right foot fifth  toe with 1 x 2 cm ulceration noted on the lateral surface, erythema of the right foot streaking from the right fifth toe proximally up to the foot, warmth.2+ edema right foot.  Callus noted on the plantar surface of the right great toe. Neurologic: CN 2-12 grossly intact. Sensation intact, DTR normal. Strength 5/5 in all 4.  Psychiatric: Normal judgment and insight. Alert and oriented x 3. Normal mood.   Labs on Admission: I have personally reviewed following labs and imaging studies  CBC: Recent Labs  Lab 10/27/17 0249  WBC 8.0  NEUTROABS 4.8  HGB 12.4  HCT 36.1  MCV 82.4  PLT 630   Basic Metabolic Panel: Recent Labs  Lab 10/27/17 0249  NA 126*  K 3.8  CL 94*  CO2 23  GLUCOSE 594*  BUN 15  CREATININE 0.96  CALCIUM 8.7*   GFR: Estimated Creatinine Clearance: 100.4 mL/min (by C-G formula based on SCr of 0.96 mg/dL). Liver Function Tests: Recent Labs  Lab 10/27/17 0249  AST 23  ALT 13*  ALKPHOS 83  BILITOT 0.5  PROT 7.8  ALBUMIN 3.0*   No results for input(s): LIPASE, AMYLASE in the last 168  hours. No results for input(s): AMMONIA in the last 168 hours. Coagulation Profile: Recent Labs  Lab 10/27/17 0249  INR 1.56   Cardiac Enzymes: No results for input(s): CKTOTAL, CKMB, CKMBINDEX, TROPONINI in the last 168 hours. BNP (last 3 results) No results for input(s): PROBNP in the last 8760 hours. HbA1C: No results for input(s): HGBA1C in the last 72 hours. CBG: Recent Labs  Lab 10/27/17 0531 10/27/17 0950 10/27/17 1116 10/27/17 1337 10/27/17 1724  GLUCAP 446* 433* 426* 356* 368*   Lipid Profile: No results for input(s): CHOL, HDL, LDLCALC, TRIG, CHOLHDL, LDLDIRECT in the last 72 hours. Thyroid Function Tests: No results for input(s): TSH, T4TOTAL, FREET4, T3FREE, THYROIDAB in the last 72 hours. Anemia Panel: No results for input(s): VITAMINB12, FOLATE, FERRITIN, TIBC, IRON, RETICCTPCT in the last 72 hours. Urine analysis:    Component Value Date/Time   COLORURINE YELLOW 10/27/2017 0249   APPEARANCEUR CLEAR 10/27/2017 0249   LABSPEC 1.010 10/27/2017 0249   PHURINE 6.0 10/27/2017 0249   GLUCOSEU >=500 (A) 10/27/2017 0249   HGBUR MODERATE (A) 10/27/2017 0249   BILIRUBINUR NEGATIVE 10/27/2017 0249   KETONESUR NEGATIVE 10/27/2017 0249   PROTEINUR NEGATIVE 10/27/2017 0249   UROBILINOGEN 0.2 07/11/2014 1555   NITRITE NEGATIVE 10/27/2017 0249   LEUKOCYTESUR NEGATIVE 10/27/2017 0249    Radiological Exams on Admission: Dg Chest 2 View  Result Date: 10/27/2017 CLINICAL DATA:  Initial evaluation for acute cough for 2 weeks. EXAM: CHEST  2 VIEW COMPARISON:  Prior radiograph from 06/12/2017. FINDINGS: The cardiac and mediastinal silhouettes are stable in size and contour, and remain within normal limits. The lungs are normally inflated. No airspace consolidation, pleural effusion, or pulmonary edema is identified. There is no pneumothorax. No acute osseous abnormality identified. IMPRESSION: No radiographic evidence for active cardiopulmonary disease. Electronically Signed    By: Jeannine Boga M.D.   On: 10/27/2017 03:31   Dg Foot Complete Right  Result Date: 10/27/2017 CLINICAL DATA:  Initial evaluation for acute pain. Soft tissue wounds at the right fifth toe and base of first MTP joint. EXAM: RIGHT FOOT COMPLETE - 3+ VIEW COMPARISON:  Prior radiograph from 07/02/2017. FINDINGS: No acute fracture or dislocation. Joint spaces maintained without evidence for significant degenerative or erosive arthropathy. Bone marrow signal intensity within normal limits. Soft tissue irregularity at the lateral  margin of the right fifth PIP joint, suspicious for ulceration. No radiopaque foreign body. No evidence for acute osteomyelitis. No other appreciable soft tissue ulceration or abnormality identified. IMPRESSION: 1. Soft tissue ulceration at the lateral margin of the right fifth PIP joint. No evidence for associated osteomyelitis. 2. No other acute abnormality identified about the right foot. Electronically Signed   By: Jeannine Boga M.D.   On: 10/27/2017 03:34    EKG: Independently reviewed.  Sinus tachycardia.  Assessment/Plan Principal Problem:   Right foot infection Active Problems:   Diabetes (HCC)   IBS (irritable bowel syndrome)   Asthma   Diabetic peripheral neuropathy (HCC)   Cellulitis in diabetic foot (HCC)   Hyperglycemia   Cellulitis of toe of right foot   URI (upper respiratory infection)   Hyponatremia   Lactic acidosis   GERD (gastroesophageal reflux disease)   RLS (restless legs syndrome)   Vaginal candidiasis   #1 right foot infection/cellulitis of the fifth toe of the right foot Patient is a diabetic presenting with 3-day history of worsening right foot infection on the fifth toe of the right foot with some surrounding cellulitis.  Patient with history of poorly controlled diabetes mellitus with last hemoglobin A1c of 11.5 in November 2017.  Patient on admission was noted to have elevated lactic acid level, with some tachycardia and  elevated blood glucose levels in the 500s.  Patient admitted, blood cultures pending, urine cultures pending.  Plain films of the right foot negative for osteomyelitis.  Will check MRI of the right foot as patient is a poorly controlled diabetic with diabetic neuropathy to rule out osteomyelitis.  Placed empirically on IV vancomycin and IV ciprofloxacin.  Wound care consultation.  IV fluids.  Supportive care.  2.  Poorly controlled diabetes mellitus with diabetic neuropathy Last hemoglobin A1c noted was 11.5 in November 2017.  Patient states on high doses of Lantus at 100 units daily.  Patient was in the emergency room overnight and finally was able to get a bed this afternoon and as such had not received any of her long-acting insulin.  Patient was given some short acting insulin in the ED.  We will repeat comprehensive metabolic profile.  Check a CBC with differential.  Check a hemoglobin A1c.  We will place on Lantus 50 units twice daily.  Sliding scale insulin.  NovoLog 10 units with each meal for meal coverage.  Consult with diabetic coordinator for diabetic education and further management.  3.  Gastroesophageal reflux disease Pepcid.  4.  Vaginal candidiasis Placed on oral Diflucan as well as clotrimazole vaginal cream.  5.  Upper respiratory symptoms Placed on Mucinex, Claritin, scheduled nebs.  Supportive care.  6.  Hyponatremia Likely secondary to elevated glucose levels.  Repeat comprehensive metabolic profile.  Give a bolus of normal saline 1 L x1 and placed on IV fluids at 125 cc/h.  Follow.  7.  History of asthma Placed on Dulera twice daily.  Scheduled nebulizers.  Claritin.  Follow.   DVT prophylaxis: Lovenox Code Status: Full Family Communication: The patient.  No family at bedside. Disposition Plan: Likely home when medically stable with clinical improvement. Consults called: None Admission status: Admit to inpatient.   Irine Seal MD [facility] Pager  [phone]  If 7PM-7AM, please contact night-coverage [email or web address] Password [facility]  10/27/2017, 7:19 PM

## 2017-10-27 NOTE — ED Notes (Signed)
Pt on cardiac monitor and auto VS 

## 2017-10-27 NOTE — ED Provider Notes (Addendum)
[facility] EMERGENCY DEPARTMENT Provider Note   CSN: [id number] Arrival date & time: 10/27/17  0203     History   Chief Complaint Chief Complaint  Patient presents with  . Foot Pain    HPI Lindsay Love is a 36 y.o. female.  Patient is a 36 year old female with a history of diabetes, cellulitis of the foot, asthma presenting today with 3 days of worsening of the wound to her right small toe now with swelling and streaking redness.  She also has noted 10 days of cough, congestion and intermittent shortness of breath. Patient states that she has had a wound on the lateral portion of her right fifth toe for approximately 2-3 months however it is only been in the last 2-3 days that she started noticing redness, swelling and now pain shooting up her leg.  She is noted fevers at home and chills but has not taken her temperature.  She has been taking also a lot of over-the-counter cold preparations that have Tylenol.  Patient has noted her blood sugars have been out of control in the 500s despite taking insulin.  However she denies any abdominal pain, nausea or vomiting. Secondly patient's URI symptoms have been going on for approximately 10 days.  She does have a cough productive of green sputum.  The cough, shortness of breath and wheezing are not improved by her inhaler.  Symptoms are made worse by walking.  Patient does state that she had a wound on the same foot in a different area approximately 1 year ago that required hospitalization and IV antibiotics but has noticed more recently that there is an area that appears to be a wound coming back in the same spot.   The history is provided by the patient and a significant other.  Foot Pain     Past Medical History:  Diagnosis Date  . Asthma   . Cellulitis of left foot   . Diabetes mellitus without complication ([place])   . IBS (irritable bowel syndrome)     Patient Active Problem List   Diagnosis Date Noted  . Cellulitis  of right foot 10/22/2016  . Diabetes ([place]) 10/22/2016  . IBS (irritable bowel syndrome) 10/22/2016  . Asthma 10/22/2016  . Right leg pain 10/22/2016  . Cellulitis and abscess of toe of right foot   . Diabetic peripheral neuropathy ([place])   . Failure of outpatient treatment     Past Surgical History:  Procedure Laterality Date  . CESAREAN SECTION    . CHOLECYSTECTOMY    . CYST EXCISION    . TONSILLECTOMY    . TOOTH EXTRACTION      OB History    No data available       Home Medications    Prior to Admission medications   Medication Sig Start Date End Date Taking? Authorizing Provider  blood glucose meter kit and supplies Dispense based on patient and insurance preference. Use up to four times daily as directed. (FOR ICD-9 250.00, 250.01). 10/25/16   Patrecia Pour, Christean Grief, MD  ciprofloxacin (CIPRO) 500 MG tablet Take 1 tablet (500 mg total) by mouth 2 (two) times daily. 08/11/17   Horton, Barbette Hair, MD  doxycycline (VIBRAMYCIN) 100 MG capsule Take 1 capsule (100 mg total) by mouth 2 (two) times daily. One po bid x 7 days 07/02/17   Malvin Johns, MD  famotidine (PEPCID) 20 MG tablet Take 1 tablet (20 mg total) by mouth 2 (two) times daily. 10/25/16   Doreatha Lew,  MD  insulin aspart (NOVOLOG) 100 UNIT/ML injection Inject 35 Units into the skin 2 (two) times daily before a meal. 10/25/16   Patrecia Pour, Christean Grief, MD  insulin detemir (LEVEMIR) 100 UNIT/ML injection Inject 1 mL (100 Units total) into the skin at bedtime. 10/25/16   Doreatha Lew, MD  metFORMIN (GLUCOPHAGE) 500 MG tablet Take 500 mg by mouth 2 (two) times daily with a meal.    [provider]  phenazopyridine (PYRIDIUM) 100 MG tablet Take 1 tablet (100 mg total) by mouth 3 (three) times daily as needed for pain. 08/11/17   Horton, Barbette Hair, MD  Probiotic CAPS Take 1 capsule by mouth daily. 05/25/17   Ward, Delice Bison, DO  traMADol (ULTRAM) 50 MG tablet Take 1 tablet (50 mg total) by mouth every 6 (six)  hours as needed. 05/25/17   Ward, Delice Bison, DO    Family History Family History  Problem Relation Age of Onset  . Diabetes Mother   . Diabetes Father     Social History Social History   Tobacco Use  . Smoking status: Never Smoker  . Smokeless tobacco: Never Used  Substance Use Topics  . Alcohol use: No  . Drug use: No     Allergies   Patient has no known allergies.   Review of Systems Review of Systems  All other systems reviewed and are negative.    Physical Exam Updated Vital Signs BP (!) 142/98 (BP Location: Right Arm)   Pulse (!) 120   Temp 98.5 F (36.9 C) (Oral)   Resp 18   Ht 5' 7" (1.702 m)   Wt 97.5 kg (215 lb)   LMP 10/14/2017   SpO2 99%   BMI 33.67 kg/m   Physical Exam  Constitutional: She is oriented to person, place, and time. She appears well-developed and well-nourished. No distress.  HENT:  Head: Normocephalic and atraumatic.  Mouth/Throat: Oropharynx is clear and moist.  Eyes: Conjunctivae and EOM are normal. Pupils are equal, round, and reactive to light.  Neck: Normal range of motion. Neck supple.  Cardiovascular: Regular rhythm and intact distal pulses. Tachycardia present.  No murmur heard. Pulmonary/Chest: Effort normal and breath sounds normal. No respiratory distress. She has no wheezes. She has no rales.  Recurrent cough but no audible wheeze  Abdominal: Soft. She exhibits no distension. There is no tenderness. There is no rebound and no guarding.  Musculoskeletal: Normal range of motion. She exhibits edema and tenderness.       Feet:  Neurological: She is alert and oriented to person, place, and time.  Skin: Skin is warm and dry. No rash noted. No erythema.  Psychiatric: She has a normal mood and affect. Her behavior is normal.  Nursing note and vitals reviewed.    ED Treatments / Results  Labs (all labs ordered are listed, but only abnormal results are displayed) Labs Reviewed  COMPREHENSIVE METABOLIC PANEL - Abnormal;  Notable for the following components:      Result Value   Sodium 126 (*)    Chloride 94 (*)    Glucose, Bld 594 (*)    Calcium 8.7 (*)    Albumin 3.0 (*)    ALT 13 (*)    All other components within normal limits  URINALYSIS, ROUTINE W REFLEX MICROSCOPIC - Abnormal; Notable for the following components:   Glucose, UA >=500 (*)    Hgb urine dipstick MODERATE (*)    All other components within normal limits  URINALYSIS, MICROSCOPIC (REFLEX) -  Abnormal; Notable for the following components:   Bacteria, UA RARE (*)    Squamous Epithelial / LPF 0-5 (*)    All other components within normal limits  I-STAT CG4 LACTIC ACID, ED - Abnormal; Notable for the following components:   Lactic Acid, Venous 4.36 (*)    All other components within normal limits  CBG MONITORING, ED - Abnormal; Notable for the following components:   Glucose-Capillary 501 (*)    All other components within normal limits  CULTURE, BLOOD (ROUTINE X 2)  CULTURE, BLOOD (ROUTINE X 2)  CBC WITH DIFFERENTIAL/PLATELET  PREGNANCY, URINE  LACTIC ACID, PLASMA  LACTIC ACID, PLASMA  PROCALCITONIN  PROTIME-INR  APTT  I-STAT CG4 LACTIC ACID, ED  I-STAT CG4 LACTIC ACID, ED  CBG MONITORING, ED    EKG  EKG Interpretation  Date/Time:  Sunday October 27 2017 02:54:01 EST Ventricular Rate:  116 PR Interval:    QRS Duration: 87 QT Interval:  321 QTC Calculation: 446 R Axis:   62 Text Interpretation:  Sinus tachycardia Low voltage, precordial leads No significant change since last tracing Confirmed by Blanchie Dessert [id number]) on 10/27/2017 2:57:13 AM       Radiology Dg Chest 2 View  Result Date: 10/27/2017 CLINICAL DATA:  Initial evaluation for acute cough for 2 weeks. EXAM: CHEST  2 VIEW COMPARISON:  Prior radiograph from 06/12/2017. FINDINGS: The cardiac and mediastinal silhouettes are stable in size and contour, and remain within normal limits. The lungs are normally inflated. No airspace consolidation, pleural  effusion, or pulmonary edema is identified. There is no pneumothorax. No acute osseous abnormality identified. IMPRESSION: No radiographic evidence for active cardiopulmonary disease. Electronically Signed   By: Jeannine Boga M.D.   On: 10/27/2017 03:31   Dg Foot Complete Right  Result Date: 10/27/2017 CLINICAL DATA:  Initial evaluation for acute pain. Soft tissue wounds at the right fifth toe and base of first MTP joint. EXAM: RIGHT FOOT COMPLETE - 3+ VIEW COMPARISON:  Prior radiograph from 07/02/2017. FINDINGS: No acute fracture or dislocation. Joint spaces maintained without evidence for significant degenerative or erosive arthropathy. Bone marrow signal intensity within normal limits. Soft tissue irregularity at the lateral margin of the right fifth PIP joint, suspicious for ulceration. No radiopaque foreign body. No evidence for acute osteomyelitis. No other appreciable soft tissue ulceration or abnormality identified. IMPRESSION: 1. Soft tissue ulceration at the lateral margin of the right fifth PIP joint. No evidence for associated osteomyelitis. 2. No other acute abnormality identified about the right foot. Electronically Signed   By: Jeannine Boga M.D.   On: 10/27/2017 03:34    Procedures Procedures (including critical care time)  Medications Ordered in ED Medications  sodium chloride 0.9 % bolus 1,000 mL (not administered)     Initial Impression / Assessment and Plan / ED Course  I have reviewed the triage vital signs and the nursing notes.  Pertinent labs & imaging results that were available during my care of the patient were reviewed by me and considered in my medical decision making (see chart for details).     Patient presenting today with a diabetic foot wound that appears to have overlying cellulitis.  And URI symptoms which could be bronchitis versus pneumonia.  No symptoms of chest pain suggestive of ACS, PE or CHF.  Patient is asthmatic but a non-smoker.   Patient is not describing symptoms suggestive of DKA at this time.  Patient is tachycardic but respiratory rate and oxygen saturations are reassuring. Patient given IV  fluids for her reported hyperglycemia also hope that will help with tachycardia.  CBC, CMP, lactate, EKG, chest x-ray, right foot films pending.  Patient meets code sepsis criteria with tachycardia, reported fever and evidence of cellulitis.  Lactate of 4.36.  Patient also had blood cultures done, code sepsis was called and was given 30/kg equaling 3 L of fluid.  Patient was started on vancomycin and Zosyn per sepsis orders.  3:53 AM Pt's labs with hyperglycemia of almost 600 currently getting fluids and will recheck CBG.  CBC wnl.  CXR and foot films wnl.  HR in the 1 teens.  Pt desires to go to [facility] will call for availability.  3:56 AM Nothing available [facility].  Will attempt to admit [facility].  AFter 2L of NS pt's blood sugar is 500 and she was given 10units of insulin.  Sepsis - Repeat Assessment  Performed at:    0500  Vitals     Blood pressure 127/86, pulse (!) 110, temperature 98.5 F (36.9 C), temperature source Oral, resp. rate 19, height 5' 7" (1.702 m), weight 97.5 kg (215 lb), last menstrual period 10/14/2017, SpO2 100 %.  Heart:     Tachycardic  Lungs:    CTA  Capillary Refill:   <2 sec  Peripheral Pulse:   Radial pulse palpable, Dorsalis pedis pulse  palpable and Posterior tibialis pulse  palpable  Skin:     Dry     Final Clinical Impressions(s) / ED Diagnoses   Final diagnoses:  Cellulitis of toe of right foot  Diabetic ulcer of toe of right foot associated with diabetes mellitus due to underlying condition, limited to breakdown of skin (HCC)  Hyperglycemia  Sepsis, due to unspecified organism [facility])    ED Discharge Orders    None       Blanchie Dessert, MD 10/27/17 2637    Blanchie Dessert, MD 10/27/17 8588    Blanchie Dessert, MD 10/27/17 [phone]

## 2017-10-27 NOTE — ED Notes (Signed)
[facility], continue to wait for available bed

## 2017-10-27 NOTE — Progress Notes (Signed)
This is a no charge note  [facility] Dr. Olin PiaPlunketts  36 year old lady with past medical history for diabetes, IBS, asthma, GERD, who presents with right foot wound infection, which has been going on for several months. Patient was found to have WBC 8.0, lactic acid of 4.36, negative pregnancy test, negative urinalysis, creatinine normal, temperature normal, has tachycardia, no tachypnea, oxygen sat 99% on room air. Negative chest x-ray. X-ray of right foot has no osteomyelitis. Patient is admitted to telemetry bed as inpatient. IV vancomycin and Zosyn was started.  Please call manager of [facility] at [phone] when pt arrives to floor   Lorretta HarpXilin Adela Esteban, MD  [facility] Pager [phone]  If 7PM-7AM, please contact night-coverage [email or web address] Password [email or web address] 10/27/2017, 4:19 AM

## 2017-10-27 NOTE — Progress Notes (Signed)
Pharmacy Antibiotic Note  Lindsay CamaraBrandena Love is a 36 y.o. female admitted on 10/27/2017 with cellulitis.  Pharmacy has been consulted for vancomycin dosing. She was given zosyn 3.375 gm and [facility]anc 2 g[facility]. WBC WNL, Cr WNL, AF. Wt 104 kg.   Plan: Vancomycin 1 gm  IV q8h for AUC est 468 (using Cr 0.96, ABW for Vd) F/u WBC, temp, renal function, culture data Vancomycin levels as needed Also getting Diflucan 200 mg IV x 1 then 100 po qday for vaginal candidiasis and also clotrimazole vaginal cream has been ordered x 7 nights  Height: 5\' 7"  (170.2 cm) Weight: 229 lb 0.9 oz (103.9 kg) IBW/kg (Calculated) : 61.6  Temp (24hrs), Avg:98.3 F (36.8 C), Min:98 F (36.7 C), Max:98.6 F (37 C)  Recent Labs  Lab 10/27/17 0249 10/27/17 0257 10/27/17 0534  WBC 8.0  --   --   CREATININE 0.96  --   --   LATICACIDVEN  --  4.36* 1.48    Estimated Creatinine Clearance: 100.4 mL/min (by C-G formula based on SCr of 0.96 mg/dL).    Allergies  Allergen Reactions  . Morphine And Related Nausea And Vomiting    Antimicrobials this admission: 12/2 zosyn x 1  12/2 vanc >> 12/2 flucazole>> 12/2 clotrimazole vaginal >> Dose adjustments this admission:  Microbiology results: 12/2 BCx2>> 12/2 Ucx>> 12/2 HIV>>  Thank you for allowing pharmacy to be a part of this patient's care. Herby AbrahamMichelle T. Ashayla Subia, Pharm.D. [phone] 10/27/2017 7:34 PM

## 2017-10-27 NOTE — ED Notes (Signed)
Nurse Patrecia Pacerystal Adkins that Code Sepsis was called [facility] to [facility]([place])

## 2017-10-27 NOTE — ED Notes (Signed)
Patient transported to X-ray 

## 2017-10-28 ENCOUNTER — Inpatient Hospital Stay (HOSPITAL_COMMUNITY): Payer: Medicaid Other

## 2017-10-28 DIAGNOSIS — E08621 Diabetes mellitus due to underlying condition with foot ulcer: Secondary | ICD-10-CM

## 2017-10-28 DIAGNOSIS — L97511 Non-pressure chronic ulcer of other part of right foot limited to breakdown of skin: Secondary | ICD-10-CM

## 2017-10-28 DIAGNOSIS — L089 Local infection of the skin and subcutaneous tissue, unspecified: Secondary | ICD-10-CM

## 2017-10-28 DIAGNOSIS — M86171 Other acute osteomyelitis, right ankle and foot: Secondary | ICD-10-CM

## 2017-10-28 DIAGNOSIS — E872 Acidosis: Secondary | ICD-10-CM

## 2017-10-28 LAB — BASIC METABOLIC PANEL
ANION GAP: 6 (ref 5–15)
BUN: 6 mg/dL (ref 6–20)
CALCIUM: 8.3 mg/dL — AB (ref 8.9–10.3)
CHLORIDE: 102 mmol/L (ref 101–111)
CO2: 25 mmol/L (ref 22–32)
Creatinine, Ser: 0.64 mg/dL (ref 0.44–1.00)
Glucose, Bld: 319 mg/dL — ABNORMAL HIGH (ref 65–99)
Potassium: 3.9 mmol/L (ref 3.5–5.1)
Sodium: 133 mmol/L — ABNORMAL LOW (ref 135–145)

## 2017-10-28 LAB — GLUCOSE, CAPILLARY
GLUCOSE-CAPILLARY: 310 mg/dL — AB (ref 65–99)
GLUCOSE-CAPILLARY: 220 mg/dL — AB (ref 65–99)
Glucose-Capillary: 287 mg/dL — ABNORMAL HIGH (ref 65–99)
Glucose-Capillary: 266 mg/dL — ABNORMAL HIGH (ref 65–99)
Glucose-Capillary: 250 mg/dL — ABNORMAL HIGH (ref 65–99)

## 2017-10-28 LAB — CBC
HCT: 33.7 % — ABNORMAL LOW (ref 36.0–46.0)
HEMOGLOBIN: 11.6 g/dL — AB (ref 12.0–15.0)
MCH: 28.3 pg (ref 26.0–34.0)
MCHC: 34.4 g/dL (ref 30.0–36.0)
MCV: 82.2 fL (ref 78.0–100.0)
PLATELETS: 347 10*3/uL (ref 150–400)
RBC: 4.1 MIL/uL (ref 3.87–5.11)
RDW: 13.9 % (ref 11.5–15.5)
WBC: 8 10*3/uL (ref 4.0–10.5)

## 2017-10-28 LAB — HIV ANTIBODY (ROUTINE TESTING W REFLEX): HIV Screen 4th Generation wRfx: NONREACTIVE

## 2017-10-28 LAB — PHOSPHORUS: Phosphorus: 3.6 mg/dL (ref 2.5–4.6)

## 2017-10-28 LAB — PROTIME-INR
INR: 0.98
PROTHROMBIN TIME: 12.9 s (ref 11.4–15.2)

## 2017-10-28 LAB — APTT: aPTT: 24 seconds (ref 24–36)

## 2017-10-28 MED ORDER — PROMETHAZINE HCL 25 MG/ML IJ SOLN
12.5000 mg | Freq: Four times a day (QID) | INTRAMUSCULAR | Status: DC | PRN
  Administered 2017-10-28: 12.5 mg via INTRAVENOUS
  Filled 2017-10-28: qty 1

## 2017-10-28 MED ORDER — INSULIN ASPART 100 UNIT/ML ~~LOC~~ SOLN
16.0000 [IU] | Freq: Three times a day (TID) | SUBCUTANEOUS | Status: DC
  Administered 2017-10-28 (×2): 16 [IU] via SUBCUTANEOUS

## 2017-10-28 MED ORDER — INSULIN DETEMIR 100 UNIT/ML ~~LOC~~ SOLN
60.0000 [IU] | Freq: Two times a day (BID) | SUBCUTANEOUS | Status: DC
  Administered 2017-10-28: 60 [IU] via SUBCUTANEOUS
  Filled 2017-10-28 (×2): qty 0.6

## 2017-10-28 MED ORDER — HYDROCERIN EX CREA
TOPICAL_CREAM | Freq: Two times a day (BID) | CUTANEOUS | Status: DC
  Administered 2017-10-28 – 2017-11-01 (×8): via TOPICAL
  Administered 2017-11-01: 1 via TOPICAL
  Administered 2017-11-02: 11:00:00 via TOPICAL
  Filled 2017-10-28 (×3): qty 113

## 2017-10-28 MED ORDER — PROCHLORPERAZINE EDISYLATE 5 MG/ML IJ SOLN: 10.0000 mg | Freq: Four times a day (QID) | INTRAMUSCULAR | Status: DC | PRN

## 2017-10-28 MED ORDER — CEFEPIME HCL 1 G IJ SOLR
1.0000 g | Freq: Three times a day (TID) | INTRAMUSCULAR | Status: DC
  Administered 2017-10-28 – 2017-11-02 (×14): 1 g via INTRAVENOUS
  Filled 2017-10-28 (×19): qty 1

## 2017-10-28 MED ORDER — VANCOMYCIN HCL 10 G IV SOLR
1250.0000 mg | Freq: Two times a day (BID) | INTRAVENOUS | Status: DC
  Administered 2017-10-29 – 2017-11-02 (×9): 1250 mg via INTRAVENOUS
  Filled 2017-10-28 (×12): qty 1250

## 2017-10-28 MED ORDER — GADOBENATE DIMEGLUMINE 529 MG/ML IV SOLN
20.0000 mL | Freq: Once | INTRAVENOUS | Status: AC | PRN
  Administered 2017-10-28: 20 mL via INTRAVENOUS

## 2017-10-28 NOTE — Progress Notes (Addendum)
Inpatient Diabetes Program Recommendations  AACE/ADA: New Consensus Statement on Inpatient Glycemic Control (2015)  Target Ranges:  Prepandial:   less than 140 mg/dL      Peak postprandial:   less than 180 mg/dL (1-2 hours)      Critically ill patients:  140 - 180 mg/dL   Results for Lindsay Love, Lindsay Love (MRN [id number]) as of 10/28/2017 11:06  Ref. Range 10/27/2017 04:08 10/27/2017 05:31 10/27/2017 09:50 10/27/2017 11:16 10/27/2017 13:37 10/27/2017 17:24 10/27/2017 21:15  Glucose-Capillary Latest Ref Range: 65 - 99 mg/dL 409501 (HH) 811446 (H) 914433 (H) 426 (H) 356 (H) 368 (H) 432 (H)   Results for Lindsay Love, Lindsay Love (MRN [id number]) as of 10/28/2017 11:06  Ref. Range 10/28/2017 08:45  Glucose-Capillary Latest Ref Range: 65 - 99 mg/dL 086266 (H)   Admit with: R Foot Cellulitis  History: DM  Home DM Meds: Lantus 100 units daily       Novolog 35 units TID       Metformin 500 mg BID  Current Insulin Orders: Levemir 50 units BID      Novolog Resistant Correction Scale/ SSI (0-20 units) TID AC + HS      Novolog 16 units TID      MD- Note Levemir 50 units BID started last PM.  2nd dose to be given at 10am today.  Novolog Meal Coverage to start at 12pm today.  Note that patient last saw her Endocrinologist ([facility] Ocie [facility]ica [facility] [place] on 06/06/2017.  Not sure why patient stated in her Home Med Rec that she hasn't been taking her basal insulin b/c she doesn't have a PCP??  Has Medicaid coverage and has been seeing an Endocrinologist.  Was seen by the DM Coordinator and counseled at length regarding the importance of good CBG control back in November 2017.   Will follow up today.    --Will follow patient during hospitalization--  Ambrose FinlandJeannine Johnston Talis Iwan RN, MSN, CDE Diabetes Coordinator Inpatient Glycemic Control Team Team Pager: [phone] (8a-5p)

## 2017-10-28 NOTE — Progress Notes (Signed)
PROGRESS NOTE    Lindsay Love  ZOX:[id number] DOB: 07/08/1981 DOA: 10/27/2017 PCP: Patient, No Pcp Per   Brief Narrative:  This 36 year old female history of poorly controlled diabetes mellitus with neuropathy, asthma, gastroesophageal reflux disease presented today ED with a 3-day history of worsening right foot swelling, pain increasing erythema and warmth.  Noted to have a cellulitis.  X-rays were negative for any osteomyelitis.  MRI of right foot consistent with osteomyelitis of the right fifth toe.  Patient on empiric IV vancomycin and IV cefepime.  Orthopedics, Dr. Lajoyce Cornersuda consulted.   Assessment & Plan:   Principal Problem:   Right foot infection Active Problems:   Acute osteomyelitis of toe, right (HCC)   Diabetes (HCC)   IBS (irritable bowel syndrome)   Asthma   Diabetic peripheral neuropathy (HCC)   Cellulitis in diabetic foot (HCC)   Hyperglycemia   Cellulitis of toe of right foot   URI (upper respiratory infection)   Hyponatremia   Lactic acidosis   GERD (gastroesophageal reflux disease)   RLS (restless legs syndrome)   Vaginal candidiasis  #1 right foot infection/cellulitis of the fifth toe of the right foot/osteomyelitis of right fifth toe Patient is a diabetic presenting with 3-day history of worsening right foot infection on the fifth toe of the right foot with some surrounding cellulitis.  Patient with history of poorly controlled diabetes mellitus with last hemoglobin A1c of 11.5 in November 2017.  Patient on admission was noted to have elevated lactic acid level, with some tachycardia and elevated blood glucose levels in the 500s.  Patient admitted, blood cultures pending, urine cultures pending.  Plain films of the right foot negative for osteomyelitis.  MRI of foot consistent with osteomyelitis of the right fifth toe.Continue empiric IV vancomycin.  Discontinue IV ciprofloxacin and placed on IV cefepime.  Consult with orthopedics, Dr. Lajoyce Cornersuda for further evaluation  and management.  2.  Poorly controlled diabetes mellitus with diabetic neuropathy Last hemoglobin A1c noted was 11.5 in November 2017.  Patient states on high doses of Lantus at 100 units daily.  Patient was in the emergency room overnight and finally was able to get a bed this afternoon and as such had not received any of her long-acting insulin.  Patient was given some short acting insulin in the ED.  Hemoglobin A1c pending.  CBGs have ranged from 266-310.  Increase Levemir to 60 units twice daily.  Increase meal coverage insulin to 16 units 3 times daily with meals.  Continue sliding scale insulin.  Diabetic coordinator following.   3.  Gastroesophageal reflux disease Pepcid.  4.  Vaginal candidiasis Continue oral Diflucan as well as clotrimazole vaginal cream.  5.  Upper respiratory symptoms Clinical improvement.  Continue Mucinex, Claritin, scheduled nebs.  Supportive care.  6.  Hyponatremia Likely secondary to elevated glucose levels.    Improved with hydration.  Follow.   7.  History of asthma Continue Dulera twice daily.  Scheduled nebulizers.  Claritin.  Follow.       DVT prophylaxis: Lovenox Code Status: Full Family Communication: Updated patient.  No family at bedside. Disposition Plan: Likely home once medically stable.   Consultants:   Orthopedics: Dr. Lajoyce Cornersuda  Procedures:  MRI right foot 10/28/2017  Chest x-ray 10/27/2017  Pain films of the right foot 10/27/2017  Antimicrobials:   IV vancomycin 10/27/2017  IV ciprofloxacin 10/27/2017>>> 10/28/2017  IV cefepime 10/28/2017   Subjective: Patient states had nausea emesis overnight and this morning.  Denies any shortness of breath or chest  pain.    Objective: Vitals:   10/27/17 2110 10/28/17 0449 10/28/17 0555 10/28/17 0942  BP: (!) 143/88 (!) 161/105 (!) 142/93   Pulse: 96 99    Resp: 18 18    Temp: 97.8 F (36.6 C) 97.8 F (36.6 C)    TempSrc: Axillary Oral    SpO2: 98% 99%  98%  Weight:        Height:        Intake/Output Summary (Last 24 hours) at 10/28/2017 1316 Last data filed at 10/28/2017 1009 Gross per 24 hour  Intake 3615 ml  Output -  Net 3615 ml   Filed Weights   10/27/17 0211 10/27/17 1721  Weight: 97.5 kg (215 lb) 103.9 kg (229 lb 0.9 oz)    Examination:  General exam: Appears calm and comfortable  Respiratory system: Clear to auscultation. Respiratory effort normal. Cardiovascular system: S1 & S2 heard, RRR. No JVD, murmurs, rubs, gallops or clicks. No pedal edema. Gastrointestinal system: Abdomen is nondistended, soft and nontender. No organomegaly or masses felt. Normal bowel sounds heard. Central nervous system: Alert and oriented. No focal neurological deficits. Extremities: Symmetric 5 x 5 power. Skin: Right foot with decreased erythema, decreased swelling around the right fifth toe.  Ulceration noted.  Psychiatry: Judgement and insight appear normal. Mood & affect appropriate.     Data Reviewed: I have personally reviewed following labs and imaging studies  CBC: Recent Labs  Lab 10/27/17 0249 10/27/17 1931 10/28/17 0552  WBC 8.0 7.8 8.0  NEUTROABS 4.8 4.6  --   HGB 12.4 10.7* 11.6*  HCT 36.1 31.5* 33.7*  MCV 82.4 83.6 82.2  PLT 385 312 347   Basic Metabolic Panel: Recent Labs  Lab 10/27/17 0249 10/27/17 1931 10/28/17 0552  NA 126* 134* 133*  K 3.8 4.2 3.9  CL 94* 106 102  CO2 23 24 25   GLUCOSE 594* 401* 319*  BUN 15 9 6   CREATININE 0.96 0.68 0.64  CALCIUM 8.7* 7.9* 8.3*  MG  --  1.8  --   PHOS  --  2.2* 3.6   GFR: Estimated Creatinine Clearance: 120.5 mL/min (by C-G formula based on SCr of 0.64 mg/dL). Liver Function Tests: Recent Labs  Lab 10/27/17 0249 10/27/17 1931  AST 23 17  ALT 13* 12*  ALKPHOS 83 70  BILITOT 0.5 0.4  PROT 7.8 6.8  ALBUMIN 3.0* 2.5*   No results for input(s): LIPASE, AMYLASE in the last 168 hours. No results for input(s): AMMONIA in the last 168 hours. Coagulation Profile: Recent Labs   Lab 10/27/17 0249 10/27/17 1931 10/28/17 0552  INR 1.56 0.96 0.98   Cardiac Enzymes: No results for input(s): CKTOTAL, CKMB, CKMBINDEX, TROPONINI in the last 168 hours. BNP (last 3 results) No results for input(s): PROBNP in the last 8760 hours. HbA1C: No results for input(s): HGBA1C in the last 72 hours. CBG: Recent Labs  Lab 10/27/17 1724 10/27/17 2115 10/28/17 0301 10/28/17 0845 10/28/17 1210  GLUCAP 368* 432* 287* 266* 310*   Lipid Profile: No results for input(s): CHOL, HDL, LDLCALC, TRIG, CHOLHDL, LDLDIRECT in the last 72 hours. Thyroid Function Tests: No results for input(s): TSH, T4TOTAL, FREET4, T3FREE, THYROIDAB in the last 72 hours. Anemia Panel: No results for input(s): VITAMINB12, FOLATE, FERRITIN, TIBC, IRON, RETICCTPCT in the last 72 hours. Sepsis Labs: Recent Labs  Lab 10/27/17 0249 10/27/17 0257 10/27/17 0534  PROCALCITON <0.10  --   --   LATICACIDVEN  --  4.36* 1.48    Recent Results (from  the past 240 hour(s))  Blood culture (routine x 2)     Status: None (Preliminary result)   Collection Time: 10/27/17  3:13 AM  Result Value Ref Range Status   Specimen Description BLOOD RIGHT ARM  Final   Special Requests   Final    BOTTLES DRAWN AEROBIC AND ANAEROBIC Blood Culture adequate volume   Culture   Final    NO GROWTH 1 DAY Performed at [facility], [address], [place], [place] [address]    Report Status PENDING  Incomplete  Blood culture (routine x 2)     Status: None (Preliminary result)   Collection Time: 10/27/17  3:13 AM  Result Value Ref Range Status   Specimen Description BLOOD RIGHT WRIST  Final   Special Requests   Final    BOTTLES DRAWN AEROBIC AND ANAEROBIC Blood Culture adequate volume   Culture   Final    NO GROWTH 1 DAY Performed at [facility], [address] [place], [place] [address]    Report Status PENDING  Incomplete         Radiology Studies: Dg Chest 2 View  Result Date: 10/27/2017 CLINICAL  DATA:  Initial evaluation for acute cough for 2 weeks. EXAM: CHEST  2 VIEW COMPARISON:  Prior radiograph from 06/12/2017. FINDINGS: The cardiac and mediastinal silhouettes are stable in size and contour, and remain within normal limits. The lungs are normally inflated. No airspace consolidation, pleural effusion, or pulmonary edema is identified. There is no pneumothorax. No acute osseous abnormality identified. IMPRESSION: No radiographic evidence for active cardiopulmonary disease. Electronically Signed   By: Rise Mu M.D.   On: 10/27/2017 03:31   Mr Foot Right W Wo Contrast  Result Date: 10/28/2017 CLINICAL DATA:  Diabetic patient with a nonhealing wound on the right fifth toe. Question osteomyelitis. EXAM: MRI OF THE RIGHT FOREFOOT WITHOUT AND WITH CONTRAST TECHNIQUE: Multiplanar, multisequence MR imaging of the right forefoot was performed before and after the administration of intravenous contrast. CONTRAST:  20 ml MULTIHANCE GADOBENATE DIMEGLUMINE 529 MG/ML IV SOLN COMPARISON:  Plain films right foot 10/27/2017. MRI right foot 10/22/2017. FINDINGS: Bones/Joint/Cartilage There is intense marrow edema and enhancement throughout the fifth toe consistent with osteomyelitis. Bone marrow signal is otherwise normal. Ligaments Intact. Muscles and Tendons Intact. Mildly increased T2 signal in intrinsic musculature of the foot with minimal enhancement could be due to inflammatory change or early denervation atrophy. No intramuscular abscess. No muscle or tendon tear. Soft tissues Subcutaneous edema is present over the dorsum of the foot. No soft tissue abscess. No evidence of septic joint. IMPRESSION: Findings consistent with osteomyelitis throughout the fifth toe without abscess or septic joint. Mild edema and enhancement intrinsic musculature the foot could be due to inflammatory change or early denervation atrophy. Electronically Signed   By: Drusilla Kanner M.D.   On: 10/28/2017 09:23   Dg Foot  Complete Right  Result Date: 10/27/2017 CLINICAL DATA:  Initial evaluation for acute pain. Soft tissue wounds at the right fifth toe and base of first MTP joint. EXAM: RIGHT FOOT COMPLETE - 3+ VIEW COMPARISON:  Prior radiograph from 07/02/2017. FINDINGS: No acute fracture or dislocation. Joint spaces maintained without evidence for significant degenerative or erosive arthropathy. Bone marrow signal intensity within normal limits. Soft tissue irregularity at the lateral margin of the right fifth PIP joint, suspicious for ulceration. No radiopaque foreign body. No evidence for acute osteomyelitis. No other appreciable soft tissue ulceration or abnormality identified. IMPRESSION: 1. Soft  tissue ulceration at the lateral margin of the right fifth PIP joint. No evidence for associated osteomyelitis. 2. No other acute abnormality identified about the right foot. Electronically Signed   By: Rise Mu M.D.   On: 10/27/2017 03:34        Scheduled Meds: . clotrimazole  1 Applicatorful Vaginal QHS  . enoxaparin (LOVENOX) injection  40 mg Subcutaneous Q24H  . famotidine  20 mg Oral BID  . fluconazole  100 mg Oral Daily  . fluticasone  2 spray Each Nare Daily  . gabapentin  600 mg Oral BID  . guaiFENesin  1,200 mg Oral BID  . hydrocerin   Topical BID  . insulin aspart  0-20 Units Subcutaneous TID WC  . insulin aspart  0-5 Units Subcutaneous QHS  . insulin aspart  16 Units Subcutaneous TID WC  . insulin detemir  50 Units Subcutaneous BID  . loratadine  10 mg Oral Daily  . mometasone-formoterol  2 puff Inhalation BID  . senna  1 tablet Oral BID   Continuous Infusions: . sodium chloride 125 mL/hr at 10/28/17 1128  . ciprofloxacin Stopped (10/28/17 1226)  . vancomycin       LOS: 1 day    Time spent: 35 minutes    Ramiro Harvest, MD [facility] Pager [phone]  If 7PM-7AM, please contact night-coverage [email or web address] Password [email or web address] 10/28/2017, 1:16 PM

## 2017-10-28 NOTE — Progress Notes (Signed)
OT Cancellation Note  Patient Details Name: Lindsay CamaraBrandena Heffern MRN: [id number] DOB: 04/12/1981   Cancelled Treatment:    Reason Eval/Treat Not Completed: OT screened, no needs identified, will sign off  [name], [place] KungLorraine D 10/28/2017, 11:19 AM

## 2017-10-28 NOTE — Progress Notes (Signed)
Patient feels nauseous and feels too warm. Room heat was slightly elevated per previous request. Patient HR 92, regular, CBG 232. Patient given cool cloth and paged provider because patient was given Zofran at 2311 and nothing else ordered. States she was given MSO4 in ED and every time she has been given MSO4 in past she gets similar symptoms like nausea and vomiting. Reduced temp in room, given cool cloth and beverage and waiting for provider to return call. Will c/t monitor.

## 2017-10-28 NOTE — Consult Note (Signed)
ORTHOPAEDIC CONSULTATION  REQUESTING PHYSICIAN: Eugenie Filler, MD  Chief Complaint: Swelling cellulitis pain right little toe  HPI: Lindsay Love is a 36 y.o. female who presents with swelling of the right lower extremity with cellulitis ulceration and drainage from the right little toe.  Patient reports that her hemoglobin A1c has improved from a 13 to an 11 and states that she is trying to work on controlling her glucose.  She does not smoke.  Past Medical History:  Diagnosis Date  . Asthma   . Cellulitis of left foot   . Diabetes mellitus without complication ([place])   . GERD (gastroesophageal reflux disease) 10/27/2017  . IBS (irritable bowel syndrome)   . RLS (restless legs syndrome) 10/27/2017   Past Surgical History:  Procedure Laterality Date  . CESAREAN SECTION    . CHOLECYSTECTOMY    . CYST EXCISION    . TONSILLECTOMY    . TOOTH EXTRACTION     Social History   Socioeconomic History  . Marital status: Single    Spouse name: None  . Number of children: None  . Years of education: None  . Highest education level: None  Social Needs  . Financial resource strain: None  . Food insecurity - worry: None  . Food insecurity - inability: None  . Transportation needs - medical: None  . Transportation needs - non-medical: None  Occupational History  . Occupation: unemployed  Tobacco Use  . Smoking status: Never Smoker  . Smokeless tobacco: Never Used  Substance and Sexual Activity  . Alcohol use: No  . Drug use: No  . Sexual activity: None  Other Topics Concern  . None  Social History Narrative  . None   Family History  Problem Relation Age of Onset  . Diabetes Mother   . Cancer Mother   . Irritable bowel syndrome Mother   . Hypertension Mother   . Sleep apnea Mother   . Diabetes Father   . Restless legs syndrome Father    - negative except otherwise stated in the family history section Allergies  Allergen Reactions  . Morphine And Related  Nausea And Vomiting   Prior to Admission medications   Medication Sig Start Date End Date Taking? Authorizing Provider  albuterol (PROVENTIL HFA;VENTOLIN HFA) 108 (90 Base) MCG/ACT inhaler Inhale 2 puffs into the lungs every 6 (six) hours as needed for wheezing or shortness of breath.   Yes [provider]  blood glucose meter kit and supplies Dispense based on patient and insurance preference. Use up to four times daily as directed. (FOR ICD-9 250.00, 250.01). 10/25/16  Yes Patrecia Pour, Christean Grief, MD  famotidine (PEPCID) 20 MG tablet Take 1 tablet (20 mg total) by mouth 2 (two) times daily. 10/25/16  Yes Patrecia Pour, Christean Grief, MD  gabapentin (NEURONTIN) 300 MG capsule Take 600 mg by mouth 2 (two) times daily.   Yes [provider]  ibuprofen (ADVIL,MOTRIN) 200 MG tablet Take 600 mg by mouth every 6 (six) hours as needed for headache or moderate pain.   Yes [provider]  insulin aspart (NOVOLOG) 100 UNIT/ML injection Inject 35 Units into the skin 2 (two) times daily before a meal. Patient taking differently: Inject 35 Units into the skin 3 (three) times daily with meals.  10/25/16  Yes Patrecia Pour, Christean Grief, MD  insulin glargine (LANTUS) 100 UNIT/ML injection Inject 100 Units into the skin daily.   Yes [provider]  ketotifen (ZADITOR) 0.025 % ophthalmic solution Place 1 drop  into both eyes as needed (allergies).   Yes [provider]  metFORMIN (GLUCOPHAGE) 500 MG tablet Take 500 mg by mouth 2 (two) times daily with a meal.   Yes [provider]  phenazopyridine (PYRIDIUM) 100 MG tablet Take 1 tablet (100 mg total) by mouth 3 (three) times daily as needed for pain. 08/11/17  Yes Horton, Barbette Hair, MD  insulin detemir (LEVEMIR) 100 UNIT/ML injection Inject 1 mL (100 Units total) into the skin at bedtime. Patient not taking: Reported on 10/27/2017 10/25/16   Doreatha Lew, MD   Dg Chest 2 View  Result Date: 10/27/2017 CLINICAL DATA:   Initial evaluation for acute cough for 2 weeks. EXAM: CHEST  2 VIEW COMPARISON:  Prior radiograph from 06/12/2017. FINDINGS: The cardiac and mediastinal silhouettes are stable in size and contour, and remain within normal limits. The lungs are normally inflated. No airspace consolidation, pleural effusion, or pulmonary edema is identified. There is no pneumothorax. No acute osseous abnormality identified. IMPRESSION: No radiographic evidence for active cardiopulmonary disease. Electronically Signed   By: Jeannine Boga M.D.   On: 10/27/2017 03:31   Mr Foot Right W Wo Contrast  Result Date: 10/28/2017 CLINICAL DATA:  Diabetic patient with a nonhealing wound on the right fifth toe. Question osteomyelitis. EXAM: MRI OF THE RIGHT FOREFOOT WITHOUT AND WITH CONTRAST TECHNIQUE: Multiplanar, multisequence MR imaging of the right forefoot was performed before and after the administration of intravenous contrast. CONTRAST:  20 ml MULTIHANCE GADOBENATE DIMEGLUMINE 529 MG/ML IV SOLN COMPARISON:  Plain films right foot 10/27/2017. MRI right foot 10/22/2017. FINDINGS: Bones/Joint/Cartilage There is intense marrow edema and enhancement throughout the fifth toe consistent with osteomyelitis. Bone marrow signal is otherwise normal. Ligaments Intact. Muscles and Tendons Intact. Mildly increased T2 signal in intrinsic musculature of the foot with minimal enhancement could be due to inflammatory change or early denervation atrophy. No intramuscular abscess. No muscle or tendon tear. Soft tissues Subcutaneous edema is present over the dorsum of the foot. No soft tissue abscess. No evidence of septic joint. IMPRESSION: Findings consistent with osteomyelitis throughout the fifth toe without abscess or septic joint. Mild edema and enhancement intrinsic musculature the foot could be due to inflammatory change or early denervation atrophy. Electronically Signed   By: Inge Rise M.D.   On: 10/28/2017 09:23   Dg Foot Complete  Right  Result Date: 10/27/2017 CLINICAL DATA:  Initial evaluation for acute pain. Soft tissue wounds at the right fifth toe and base of first MTP joint. EXAM: RIGHT FOOT COMPLETE - 3+ VIEW COMPARISON:  Prior radiograph from 07/02/2017. FINDINGS: No acute fracture or dislocation. Joint spaces maintained without evidence for significant degenerative or erosive arthropathy. Bone marrow signal intensity within normal limits. Soft tissue irregularity at the lateral margin of the right fifth PIP joint, suspicious for ulceration. No radiopaque foreign body. No evidence for acute osteomyelitis. No other appreciable soft tissue ulceration or abnormality identified. IMPRESSION: 1. Soft tissue ulceration at the lateral margin of the right fifth PIP joint. No evidence for associated osteomyelitis. 2. No other acute abnormality identified about the right foot. Electronically Signed   By: Jeannine Boga M.D.   On: 10/27/2017 03:34   - pertinent xrays, CT, MRI studies were reviewed and independently interpreted  Positive ROS: All other systems have been reviewed and were otherwise negative with the exception of those mentioned in the HPI and as above.  Physical Exam: General: Alert, no acute distress Psychiatric: Patient is competent for consent with normal mood  and affect Lymphatic: No axillary or cervical lymphadenopathy Cardiovascular: No pedal edema Respiratory: No cyanosis, no use of accessory musculature GI: No organomegaly, abdomen is soft and non-tender  Skin: Patient has cellulitis of the right little toe with ulceration and swelling of the right lower extremity.   Neurologic: Patient does not have protective sensation bilateral lower extremities.   MUSCULOSKELETAL:  Patient has a good dorsalis pedis pulse with pitting edema in the foot ankle and leg.  There is cellulitis involving the little toe with ulceration there is no asending cellulitis.  Review of the MRI scan shows osteomyelitis  involving the little toe.  There is no abscess.  Assessment: Assessment: Diabetic insensate neuropathy with osteomyelitis ulceration cellulitis of the right little toe.  Plan: Plan: We will plan for a right foot fifth ray amputation.  Continue IV antibiotics through Saturday morning.  Anticipate surgery on Friday at [facility].  Patient will need to be transferred to [facility] for surgery.  Discussed the importance with patient for nonweightbearing on the right lower extremity postoperatively.  Discussed the importance of better glucose control.  Anticipate patient could be discharged to home on Saturday after surgery.  Thank you for the consult and the opportunity to see Ms. Derrell Lolling, MD [facility] [phone] 6:07 PM

## 2017-10-28 NOTE — Progress Notes (Signed)
Home DM Meds: Lantus 100 units daily                             Novolog 35 units TID                             Metformin 500 mg BID  Current Insulin Orders: Levemir 50 units BID                                       Novolog Resistant Correction Scale/ SSI (0-20 units) TID AC + HS                                       Novolog 16 units TID    MD- Please consider increasing Novolog Meal Coverage to:  Novolog 25 units TID with meals    Note that patient last saw her Endocrinologist (Dr. Ocie CornfieldMonica Doerr [facility] [place] [facility] [place] [facility]) on 06/06/2017.  Not sure why patient stated in her Home Med Rec that she hasn't been taking her basal insulin b/c she doesn't have a PCP??  Has Medicaid coverage and has been seeing an Endocrinologist.  Was seen by the DM Coordinator and counseled at length regarding the importance of good CBG control back in November 2017.  Spoke with patient today.  Pt stated she has access to medications, has CBG meter at home, and is active [place] [place] [facility].  Encouraged pt to make a follow-up appt with Dr. Roanna Raideroerr immediately after discharge to further review CBGs and Insulin doses.  Pt stated to me that she is working with Dr. Roanna Raideroerr to possibly get an insulin pump.  Not taking Metformin at home but plans to ask Dr. Roanna Raideroerr about the Metformin.  Discussed with pt that we have a current Hemoglobin A1c  Level pending.  Pt told me her last A1c was in the 12% range.  Did not have any questions or concerns for me and seemed to be an active participant in her care with her Endocrinologist even though her CBGs are incredibly high.      --Will follow patient during hospitalization--  Ambrose FinlandJeannine Johnston Cadie Sorci RN, MSN, CDE Diabetes Coordinator Inpatient Glycemic Control Team Team Pager: [phone] (8a-5p)

## 2017-10-28 NOTE — Consult Note (Signed)
[place] Nurse wound consult note Reason for Consult:Neuropathic foot ulcerations. Patient reports that for the past 3 weeks, as her right 5th digit ulceration worsened, the right first met head ulceration worsened because she was trying to alter her gait and offload the 5th digit. Patient requires follow up in an orthopedic or outpatient [facility] office with pedorthist services for the provision of diabetic shoe wear. She notes that she did not follow up with the outpatient [facility] last year due to finances. Wound type: Neuropathic Pressure Injury POA: N/A Measurement:  Right foot, 5th digit: 2cm x 1cm with depth undetermined due to the presence of necrotic tissue in wound bed.  Erythema and edema surrounds. White tissue and mild odor of necrotic tissue present. There is a purple hue over the skin at the lateral aspect of the foot measuring 2.5cm x 1cm which could indicate infection or tissue necrosis or both. Right foot 1st met head:  2cm round area of tissue injury with ecchymosis and ulceration beneath the skin. No depth. Wound bed:As noted above Drainage (amount, consistency, odor) None Periwound:Edeamtous and erythematous.  Marking around the 5th digit, anterior foot performs last evening.   Dressing procedure/placement/frequency: I will implement conservative wound care using NS dressing twice daily to both lesions.  Patient also indicates her has a heel fissure on the left heel (measures 0.8cm x 0.2cm with unappreciable depth), so we will wash and follow cleansing with Eucerin cream twice daily there. I will provide a Prevalon Boot to the right foot for elevation and protection during this infectious flare. Recommend consultation with orthopedics (Dr. Eather Colas) while in house to see if an operative procedure/debridement is needed while here and if he can follow as an outpatient.   [place] nursing team will not follow, but will remain available to this patient, the nursing and medical teams.  Please re-consult if  needed. Thanks, Maudie Flakes, MSN, RN, [place], Arther Abbott  Pager# [phone]

## 2017-10-28 NOTE — Evaluation (Signed)
Physical Therapy Evaluation Patient Details Name: Lindsay CamaraBrandena Love MRN: [id number] DOB: 06/29/1981 Today's Date: 10/28/2017   History of Present Illness  36 y.o. female with medical history significant of poorly controlled diabetes mellitus with neuropathy, asthma, restless leg syndrome, gastroesophageal reflux disease, IBS who [facility] the [facility] ED complains of a 3-day history of worsening right foot and right fifth toe pain with increasing erythema, warmth.  Patient also endorses a 10-day history of upper respiratory symptoms including a cough, congestion, intermittent shortness of breath, intermittent wheezing.  Dx of cellulitis R 5th toe.  Clinical Impression  Pt is independent with mobility, she ambulated 110' without an assistive device and had no loss of balance. Distance limited by RLE pain. Pt has crutches at home she could use to [facility] RLE for comfort. No further PT indicated as she is independent with mobility, will sign off.      Follow Up Recommendations No PT follow up    Equipment Recommendations  None recommended by PT    Recommendations for Other Services       Precautions / Restrictions Precautions Precautions: Fall Precaution Comments: fall risk 2* neuropathy -pt reports 1 fall a couple months ago Restrictions Weight Bearing Restrictions: No      Mobility  Bed Mobility Overal bed mobility: Independent                Transfers Overall transfer level: Independent                  Ambulation/Gait Ambulation/Gait assistance: Independent Ambulation Distance (Feet): 110 Feet Assistive device: None Gait Pattern/deviations: WFL(Within Functional Limits)   Gait velocity interpretation: at or above normal speed for age/gender General Gait Details: steady, no loss of balance, distance limited by increasing RLE pain   Stairs            Wheelchair Mobility    Modified Rankin (Stroke Patients Only)       Balance  Overall balance assessment: History of Falls                                           Pertinent Vitals/Pain Pain Assessment: 0-10 Pain Score: 6  Pain Location: RLE with walking Pain Descriptors / Indicators: Sore Pain Intervention(s): Limited activity within patient's tolerance;Monitored during session(pt declined pain meds)    Home Living Family/patient expects to be discharged to:: Private residence Living Arrangements: Children(6 and 36 y.o. sons) Available Help at Discharge: Available PRN/intermittently   Home Access: Stairs to enter Entrance Stairs-Rails: None Entrance Stairs-Number of Steps: 3 Home Layout: One level Home Equipment: Crutches Additional Comments: has crutches from hamstring tear a couple months ago    Prior Function Level of Independence: Independent               Hand Dominance        Extremity/Trunk Assessment   Upper Extremity Assessment Upper Extremity Assessment: Overall WFL for tasks assessed    Lower Extremity Assessment Lower Extremity Assessment: Overall WFL for tasks assessed(B feet numb to light touch at baseline 2* peripheral neuropathy; edema/redness noted R 5th MTP area)    Cervical / Trunk Assessment Cervical / Trunk Assessment: Normal  Communication   Communication: No difficulties  Cognition Arousal/Alertness: Awake/alert Behavior During Therapy: WFL for tasks assessed/performed Overall Cognitive Status: Within Functional Limits for tasks assessed  General Comments      Exercises     Assessment/Plan    PT Assessment Patent does not need any further PT services  PT Problem List         PT Treatment Interventions      PT Goals (Current goals can be found in the Care Plan section)  Acute Rehab PT Goals PT Goal Formulation: All assessment and education complete, DC therapy    Frequency     Barriers to discharge         Co-evaluation               AM-PAC PT "6 Clicks" Daily Activity  Outcome Measure Difficulty turning over in bed (including adjusting bedclothes, sheets and blankets)?: None Difficulty moving from lying on back to sitting on the side of the bed? : None Difficulty sitting down on and standing up from a chair with arms (e.g., wheelchair, bedside commode, etc,.)?: None Help needed moving to and from a bed to chair (including a wheelchair)?: None Help needed walking in hospital room?: None Help needed climbing 3-5 steps with a railing? : None 6 Click Score: 24    End of Session   Activity Tolerance: Patient tolerated treatment well Patient left: in bed;with call bell/phone within reach Nurse Communication: Mobility status      Time: 0922-0942 PT Time Calculation (min) (ACUTE ONLY): 20 min   Charges:   PT Evaluation $PT Eval Low Complexity: 1 Low     PT G Codes:         Tamala SerUhlenberg, Zeeshan Korte Kistler 10/28/2017, 9:49 AM [phone]

## 2017-10-28 NOTE — Progress Notes (Signed)
Pharmacy - Brief Note (vancomycin dosing)  Day #2 vancomycin Diabetic foot wound with osteomyelitis of the R 5th toe w/o abscess or septic joint  Based on lab parameters and empiric dosing calculations will adjust vancomycin to 1250mg  IV q12h  Juliette Alcideustin Zeigler, PharmD, BCPS.   Pager: [phone] 10/28/2017 10:47 AM

## 2017-10-28 NOTE — Progress Notes (Signed)
Date:  October 28, 2017 Chart reviewed for concurrent status and case management needs.  Will continue to follow patient progress.  Discharge Planning: following for needs  Expected discharge date: October 31, 2017  Sujey Gundry, BSN, RN3, CCM   [phone]  

## 2017-10-29 LAB — CBC WITH DIFFERENTIAL/PLATELET
Basophils Absolute: 0 10*3/uL (ref 0.0–0.1)
Basophils Relative: 0 %
EOS PCT: 3 %
Eosinophils Absolute: 0.2 10*3/uL (ref 0.0–0.7)
HCT: 31.9 % — ABNORMAL LOW (ref 36.0–46.0)
Hemoglobin: 10.8 g/dL — ABNORMAL LOW (ref 12.0–15.0)
LYMPHS ABS: 2.4 10*3/uL (ref 0.7–4.0)
LYMPHS PCT: 33 %
MCH: 28.1 pg (ref 26.0–34.0)
MCHC: 33.9 g/dL (ref 30.0–36.0)
MCV: 82.9 fL (ref 78.0–100.0)
MONO ABS: 0.4 10*3/uL (ref 0.1–1.0)
Monocytes Relative: 5 %
Neutro Abs: 4.3 10*3/uL (ref 1.7–7.7)
Neutrophils Relative %: 59 %
PLATELETS: 349 10*3/uL (ref 150–400)
RBC: 3.85 MIL/uL — ABNORMAL LOW (ref 3.87–5.11)
RDW: 13.9 % (ref 11.5–15.5)
WBC: 7.3 10*3/uL (ref 4.0–10.5)

## 2017-10-29 LAB — BASIC METABOLIC PANEL WITH GFR
Anion gap: 7 (ref 5–15)
BUN: 10 mg/dL (ref 6–20)
CO2: 23 mmol/L (ref 22–32)
Calcium: 8.5 mg/dL — ABNORMAL LOW (ref 8.9–10.3)
Chloride: 101 mmol/L (ref 101–111)
Creatinine, Ser: 0.68 mg/dL (ref 0.44–1.00)
GFR calc Af Amer: >60 mL/min
GFR calc non Af Amer: >60 mL/min
Glucose, Bld: 305 mg/dL — ABNORMAL HIGH (ref 65–99)
Potassium: 3.4 mmol/L — ABNORMAL LOW (ref 3.5–5.1)
Sodium: 131 mmol/L — ABNORMAL LOW (ref 135–145)

## 2017-10-29 LAB — GLUCOSE, CAPILLARY
Glucose-Capillary: 349 mg/dL — ABNORMAL HIGH (ref 65–99)
Glucose-Capillary: 128 mg/dL — ABNORMAL HIGH (ref 65–99)
Glucose-Capillary: 148 mg/dL — ABNORMAL HIGH (ref 65–99)

## 2017-10-29 LAB — URINE CULTURE: Culture: <10000 — AB

## 2017-10-29 LAB — HEMOGLOBIN A1C
HEMOGLOBIN A1C: 13 % — AB (ref 4.8–5.6)
MEAN PLASMA GLUCOSE: 326 mg/dL

## 2017-10-29 MED ORDER — INSULIN ASPART 100 UNIT/ML ~~LOC~~ SOLN
25.0000 [IU] | Freq: Three times a day (TID) | SUBCUTANEOUS | Status: DC
  Administered 2017-10-29 – 2017-10-31 (×8): 25 [IU] via SUBCUTANEOUS

## 2017-10-29 MED ORDER — INSULIN DETEMIR 100 UNIT/ML ~~LOC~~ SOLN
64.0000 [IU] | Freq: Two times a day (BID) | SUBCUTANEOUS | Status: DC
  Administered 2017-10-29 – 2017-10-30 (×3): 64 [IU] via SUBCUTANEOUS
  Filled 2017-10-29 (×5): qty 0.64

## 2017-10-29 MED ORDER — POTASSIUM CHLORIDE CRYS ER 20 MEQ PO TBCR
40.0000 meq | EXTENDED_RELEASE_TABLET | Freq: Once | ORAL | Status: AC
  Administered 2017-10-29: 40 meq via ORAL
  Filled 2017-10-29: qty 2

## 2017-10-29 MED ORDER — PROCHLORPERAZINE EDISYLATE 5 MG/ML IJ SOLN: 10.0000 mg | Freq: Four times a day (QID) | INTRAMUSCULAR | Status: DC | PRN

## 2017-10-29 MED ORDER — INSULIN ASPART 100 UNIT/ML ~~LOC~~ SOLN: 20.0000 [IU] | Freq: Three times a day (TID) | SUBCUTANEOUS | Status: DC

## 2017-10-29 NOTE — Progress Notes (Signed)
Report called to 6N. Pt walked downstairs with [facility] for transport.

## 2017-10-29 NOTE — Progress Notes (Signed)
PROGRESS NOTE    Hedy CamaraBrandena Winters  OZH:[id number] DOB: 09/18/1981 DOA: 10/27/2017 PCP: Patient, No Pcp Per   Brief Narrative:  This 36 year old female history of poorly controlled diabetes mellitus with neuropathy, asthma, gastroesophageal reflux disease presented today ED with a 3-day history of worsening right foot swelling, pain increasing erythema and warmth.  Noted to have a cellulitis.  X-rays were negative for any osteomyelitis.  MRI of right foot consistent with osteomyelitis of the right fifth toe.  Patient on empiric IV vancomycin and IV cefepime.  Orthopedics, Dr. Lajoyce Cornersuda consulted.   Assessment & Plan:   Principal Problem:   Right foot infection Active Problems:   Acute osteomyelitis of toe, right (HCC)   Diabetes (HCC)   IBS (irritable bowel syndrome)   Asthma   Diabetic peripheral neuropathy (HCC)   Cellulitis in diabetic foot (HCC)   Hyperglycemia   Cellulitis of toe of right foot   URI (upper respiratory infection)   Hyponatremia   Lactic acidosis   GERD (gastroesophageal reflux disease)   RLS (restless legs syndrome)   Vaginal candidiasis   Diabetic ulcer of toe of right foot associated with diabetes mellitus due to underlying condition, limited to breakdown of skin (HCC)  #1 right foot infection/cellulitis of the fifth toe of the right foot/osteomyelitis of right fifth toe Patient is a diabetic presenting with 3-day history of worsening right foot infection on the fifth toe of the right foot with some surrounding cellulitis.  Patient with history of poorly controlled diabetes mellitus with last hemoglobin A1c of 11.5 in November 2017.Hemoglobin A1c here is 13.0 on 10/27/2017.  Patient on admission was noted to have elevated lactic acid level, with some tachycardia and elevated blood glucose levels in the 500s on admission.  Patient admitted, blood cultures pending, urine cultures with less than 10,000 colonies. Plain films of the right foot negative for osteomyelitis.   MRI of foot consistent with osteomyelitis of the right fifth toe. Continue empiric IV vancomycin and IV cefepime.  IV ciprofloxacin has been discontinued.  Patient has been seen in consultation by Dr. Lajoyce Cornersuda who is recommending right foot fifth ray amputation and recommending patient to be [facility] surgery to be done hopefully on Friday, November 01, 2017.  Dr. Lajoyce Cornersuda following and I appreciate his input and recommendations..   2.  Poorly controlled diabetes mellitus with diabetic neuropathy Last hemoglobin A1c noted was 11.5 in November 2017.  Hemoglobin A1c here is 13.0 on 10/27/2017. Patient states on high doses of Lantus at 100 units daily.  Patient was in the emergency room overnight and finally was able to get a bed and as such had not received any of her long-acting insulin.  Patient was given some short acting insulin in the ED. CBGs have ranged from 220-349.  Increase Levemir to 64 units twice daily.  Increase meal coverage insulin to 25 units 3 times daily with meals.  Continue sliding scale insulin.  Diabetic coordinator following.   3.  Gastroesophageal reflux disease Pepcid.  4.  Vaginal candidiasis Continue oral Diflucan as well as clotrimazole vaginal cream.  5.  Upper respiratory symptoms Clinical improvement.  Continue Mucinex, Claritin, scheduled nebs.  Supportive care.  6.  Hyponatremia Likely secondary to elevated glucose levels.  Improving with hydration.  Follow.    7.  History of asthma Continue Dulera twice daily.  Scheduled nebulizers.  Claritin.  Follow.  8.  Hypokalemia Replete.       DVT prophylaxis: Lovenox Code Status: Full Family  Communication: Updated patient.  No family at bedside. Disposition Plan: Transfer to [facility] in anticipation of surgery on Friday, 11/01/2017.     Consultants:   Orthopedics: Dr. Lajoyce Corners  Procedures:  MRI right foot 10/28/2017  Chest x-ray 10/27/2017  Pain films of the right foot  10/27/2017  Antimicrobials:   IV vancomycin 10/27/2017  IV ciprofloxacin 10/27/2017>>> 10/28/2017  IV cefepime 10/28/2017   Subjective: Patient states nausea and emesis improved.  Patient feels it secondary to morphine.  No shortness of breath.  No chest pain.  Tolerating diet.  Objective: Vitals:   10/28/17 0942 10/28/17 1506 10/28/17 2222 10/29/17 0558  BP:  134/88 (!) 144/94 135/73  Pulse:  100 95 92  Resp:  18 18 18   Temp:  98.8 F (37.1 C) (!) 97.5 F (36.4 C) 97.8 F (36.6 C)  TempSrc:  Oral Oral Oral  SpO2: 98% 98% 98% 97%  Weight:      Height:        Intake/Output Summary (Last 24 hours) at 10/29/2017 1203 Last data filed at 10/29/2017 1033 Gross per 24 hour  Intake 2330 ml  Output -  Net 2330 ml   Filed Weights   10/27/17 0211 10/27/17 1721  Weight: 97.5 kg (215 lb) 103.9 kg (229 lb 0.9 oz)    Examination:  General exam: NAD. Respiratory system: Clear to auscultation bilaterally.  No wheezes no crackles no rhonchi. Respiratory effort normal. Cardiovascular system: Regular rate and rhythm no murmurs rubs or gallops.  No pedal edema.  Gastrointestinal system: Abdomen is soft, nontender, nondistended, positive bowel sounds.  No hepatosplenomegaly.  Central nervous system: Alert and oriented. No focal neurological deficits. Extremities: Symmetric 5 x 5 power. Skin: Right foot with decreased erythema, decreased swelling around the right fifth toe.  Ulceration noted.  Psychiatry: Judgement and insight appear normal. Mood & affect appropriate.     Data Reviewed: I have personally reviewed following labs and imaging studies  CBC: Recent Labs  Lab 10/27/17 0249 10/27/17 1931 10/28/17 0552 10/29/17 0559  WBC 8.0 7.8 8.0 7.3  NEUTROABS 4.8 4.6  --  4.3  HGB 12.4 10.7* 11.6* 10.8*  HCT 36.1 31.5* 33.7* 31.9*  MCV 82.4 83.6 82.2 82.9  PLT 385 312 347 349   Basic Metabolic Panel: Recent Labs  Lab 10/27/17 0249 10/27/17 1931 10/28/17 0552 10/29/17 0559    NA 126* 134* 133* 131*  K 3.8 4.2 3.9 3.4*  CL 94* 106 102 101  CO2 23 24 25 23   GLUCOSE 594* 401* 319* 305*  BUN 15 9 6 10   CREATININE 0.96 0.68 0.64 0.68  CALCIUM 8.7* 7.9* 8.3* 8.5*  MG  --  1.8  --   --   PHOS  --  2.2* 3.6  --    GFR: Estimated Creatinine Clearance: 120.5 mL/min (by C-G formula based on SCr of 0.68 mg/dL). Liver Function Tests: Recent Labs  Lab 10/27/17 0249 10/27/17 1931  AST 23 17  ALT 13* 12*  ALKPHOS 83 70  BILITOT 0.5 0.4  PROT 7.8 6.8  ALBUMIN 3.0* 2.5*   No results for input(s): LIPASE, AMYLASE in the last 168 hours. No results for input(s): AMMONIA in the last 168 hours. Coagulation Profile: Recent Labs  Lab 10/27/17 0249 10/27/17 1931 10/28/17 0552  INR 1.56 0.96 0.98   Cardiac Enzymes: No results for input(s): CKTOTAL, CKMB, CKMBINDEX, TROPONINI in the last 168 hours. BNP (last 3 results) No results for input(s): PROBNP in the last 8760 hours. HbA1C: Recent  Labs    10/27/17 1805  HGBA1C 13.0*   CBG: Recent Labs  Lab 10/28/17 0845 10/28/17 1210 10/28/17 1633 10/28/17 2159 10/29/17 1134  GLUCAP 266* 310* 250* 220* 349*   Lipid Profile: No results for input(s): CHOL, HDL, LDLCALC, TRIG, CHOLHDL, LDLDIRECT in the last 72 hours. Thyroid Function Tests: No results for input(s): TSH, T4TOTAL, FREET4, T3FREE, THYROIDAB in the last 72 hours. Anemia Panel: No results for input(s): VITAMINB12, FOLATE, FERRITIN, TIBC, IRON, RETICCTPCT in the last 72 hours. Sepsis Labs: Recent Labs  Lab 10/27/17 0249 10/27/17 0257 10/27/17 0534  PROCALCITON <0.10  --   --   LATICACIDVEN  --  4.36* 1.48    Recent Results (from the past 240 hour(s))  Blood culture (routine x 2)     Status: None (Preliminary result)   Collection Time: 10/27/17  3:13 AM  Result Value Ref Range Status   Specimen Description BLOOD RIGHT ARM  Final   Special Requests   Final    BOTTLES DRAWN AEROBIC AND ANAEROBIC Blood Culture adequate volume   Culture    Final    NO GROWTH 2 DAYS Performed at [facility], [address] [place], [place] [address]    Report Status PENDING  Incomplete  Blood culture (routine x 2)     Status: None (Preliminary result)   Collection Time: 10/27/17  3:13 AM  Result Value Ref Range Status   Specimen Description BLOOD RIGHT WRIST  Final   Special Requests   Final    BOTTLES DRAWN AEROBIC AND ANAEROBIC Blood Culture adequate volume   Culture   Final    NO GROWTH 2 DAYS Performed at [facility], [address] [place], [place] [address]    Report Status PENDING  Incomplete  Urine culture     Status: Abnormal   Collection Time: 10/28/17  6:45 AM  Result Value Ref Range Status   Specimen Description URINE, CLEAN CATCH  Final   Special Requests NONE  Final   Culture (A)  Final    <10,000 COLONIES/mL INSIGNIFICANT GROWTH Performed at [facility], [address] [place], [place] [address]    Report Status 10/29/2017 FINAL  Final         Radiology Studies: Mr Foot Right W Wo Contrast  Result Date: 10/28/2017 CLINICAL DATA:  Diabetic patient with a nonhealing wound on the right fifth toe. Question osteomyelitis. EXAM: MRI OF THE RIGHT FOREFOOT WITHOUT AND WITH CONTRAST TECHNIQUE: Multiplanar, multisequence MR imaging of the right forefoot was performed before and after the administration of intravenous contrast. CONTRAST:  20 ml MULTIHANCE GADOBENATE DIMEGLUMINE 529 MG/ML IV SOLN COMPARISON:  Plain films right foot 10/27/2017. MRI right foot 10/22/2017. FINDINGS: Bones/Joint/Cartilage There is intense marrow edema and enhancement throughout the fifth toe consistent with osteomyelitis. Bone marrow signal is otherwise normal. Ligaments Intact. Muscles and Tendons Intact. Mildly increased T2 signal in intrinsic musculature of the foot with minimal enhancement could be due to inflammatory change or early denervation atrophy. No intramuscular abscess. No muscle or tendon tear. Soft tissues  Subcutaneous edema is present over the dorsum of the foot. No soft tissue abscess. No evidence of septic joint. IMPRESSION: Findings consistent with osteomyelitis throughout the fifth toe without abscess or septic joint. Mild edema and enhancement intrinsic musculature the foot could be due to inflammatory change or early denervation atrophy. Electronically Signed   By: Drusilla Kannerhomas  Dalessio M.D.   On: 10/28/2017 09:23  Scheduled Meds: . clotrimazole  1 Applicatorful Vaginal QHS  . enoxaparin (LOVENOX) injection  40 mg Subcutaneous Q24H  . famotidine  20 mg Oral BID  . fluconazole  100 mg Oral Daily  . fluticasone  2 spray Each Nare Daily  . gabapentin  600 mg Oral BID  . guaiFENesin  1,200 mg Oral BID  . hydrocerin   Topical BID  . insulin aspart  0-20 Units Subcutaneous TID WC  . insulin aspart  0-5 Units Subcutaneous QHS  . insulin aspart  25 Units Subcutaneous TID WC  . insulin detemir  64 Units Subcutaneous BID  . loratadine  10 mg Oral Daily  . mometasone-formoterol  2 puff Inhalation BID  . senna  1 tablet Oral BID   Continuous Infusions: . sodium chloride 125 mL/hr at 10/29/17 0911  . ceFEPime (MAXIPIME) IV Stopped (10/29/17 0545)  . vancomycin Stopped (10/29/17 0730)     LOS: 2 days    Time spent: 35 minutes    Ramiro Harvest, MD [facility] Pager [phone]  If 7PM-7AM, please contact night-coverage [email or web address] Password [email or web address] 10/29/2017, 12:03 PM

## 2017-10-29 NOTE — Progress Notes (Signed)
Inpatient Diabetes Program Recommendations  AACE/ADA: New Consensus Statement on Inpatient Glycemic Control (2015)  Target Ranges:  Prepandial:   less than 140 mg/dL      Peak postprandial:   less than 180 mg/dL (1-2 hours)      Critically ill patients:  140 - 180 mg/dL   Results for Lindsay Love, Eriko (MRN [id number]) as of 10/29/2017 07:51  Ref. Range 10/28/2017 08:45 10/28/2017 12:10 10/28/2017 16:33 10/28/2017 21:59  Glucose-Capillary Latest Ref Range: 65 - 99 mg/dL 119266 (H)  11 units Novolog 310 (H)  31 units Novolog 250 (H)  23 units Novolog 220 (H)  2 units Novolog   Results for Lindsay Love, [name] (MRN [id number]) as of 10/29/2017 07:51  Ref. Range 10/29/2017 05:59  Glucose Latest Ref Range: 65 - 99 mg/dL 130305 (H)    Home DM Meds:Lantus 100 units daily Novolog 35 units TID Metformin 500 mg BID  Current Insulin Orders:Levemir 60 units BID Novolog Resistant Correction Scale/ SSI (0-20 units) TID AC + HS Novolog 16 units TID    MD- Note Levemir increased to 60 units BID.  Pt got total of 110 units Levemir yesterday (12/03).  Will get total of 120 units Levemir today (12/04).  Please also consider increasing Novolog Meal Coverage to:  Novolog 25 units TID with meals      --Will follow patient during hospitalization--  Ambrose FinlandJeannine Johnston Sima Lindenberger RN, MSN, CDE Diabetes Coordinator Inpatient Glycemic Control Team Team Pager: [phone] (8a-5p)

## 2017-10-30 ENCOUNTER — Other Ambulatory Visit (INDEPENDENT_AMBULATORY_CARE_PROVIDER_SITE_OTHER): Payer: Self-pay | Admitting: Family

## 2017-10-30 DIAGNOSIS — L03031 Cellulitis of right toe: Secondary | ICD-10-CM

## 2017-10-30 DIAGNOSIS — L03119 Cellulitis of unspecified part of limb: Secondary | ICD-10-CM

## 2017-10-30 DIAGNOSIS — E118 Type 2 diabetes mellitus with unspecified complications: Secondary | ICD-10-CM

## 2017-10-30 DIAGNOSIS — Z794 Long term (current) use of insulin: Secondary | ICD-10-CM

## 2017-10-30 DIAGNOSIS — J4520 Mild intermittent asthma, uncomplicated: Secondary | ICD-10-CM

## 2017-10-30 DIAGNOSIS — E11628 Type 2 diabetes mellitus with other skin complications: Secondary | ICD-10-CM

## 2017-10-30 LAB — GLUCOSE, CAPILLARY
Glucose-Capillary: 255 mg/dL — ABNORMAL HIGH (ref 65–99)
Glucose-Capillary: 217 mg/dL — ABNORMAL HIGH (ref 65–99)
Glucose-Capillary: 241 mg/dL — ABNORMAL HIGH (ref 65–99)
Glucose-Capillary: 179 mg/dL — ABNORMAL HIGH (ref 65–99)

## 2017-10-30 MED ORDER — INSULIN DETEMIR 100 UNIT/ML ~~LOC~~ SOLN
70.0000 [IU] | Freq: Two times a day (BID) | SUBCUTANEOUS | Status: DC
  Administered 2017-10-30 – 2017-11-02 (×6): 70 [IU] via SUBCUTANEOUS
  Filled 2017-10-30 (×7): qty 0.7

## 2017-10-30 NOTE — Progress Notes (Signed)
Pharmacy Antibiotic Note  Lindsay CamaraBrandena Love is a 36 y.o. female admitted on 10/27/2017 with cellulitis.  Pharmacy has been consulted for vancomycin dosing.  Continues on abx for osteomyelitis of the toe and surrounding foot cellulitis. 12/3 right foot MRI: osteomyelitis throughout the fifth toe without abscess or septic joint. Plan for amputation on 12/7. Afebrile, WBC wnl.  Plan: Continue cefepime 1g IV Q8h Continue vancomycin 1,250mg  IV Q12h Continue fluconazole 100mg  PO daily per MD Monitor clinical picture, renal function, VT prn F/U C&S, abx deescalation / LOT  Height: 5\' 7"  (170.2 cm) Weight: 215 lb (97.5 kg) IBW/kg (Calculated) : 61.6  Temp (24hrs), Avg:98.3 F (36.8 C), Min:97.7 F (36.5 C), Max:98.7 F (37.1 C)  Recent Labs  Lab 10/27/17 0249 10/27/17 0257 10/27/17 0534 10/27/17 1931 10/28/17 0552 10/29/17 0559  WBC 8.0  --   --  7.8 8.0 7.3  CREATININE 0.96  --   --  0.68 0.64 0.68  LATICACIDVEN  --  4.36* 1.48  --   --   --     Estimated Creatinine Clearance: 116.6 mL/min (by C-G formula based on SCr of 0.68 mg/dL).    Allergies  Allergen Reactions  . Morphine And Related Nausea And Vomiting   Lindsay BiNathan Tesean Love, PharmD, [facility] Clinical Pharmacist Pager [phone] 10/30/2017 8:53 AM

## 2017-10-30 NOTE — H&P (View-Only) (Signed)
Patient ID: Lindsay Love, female   DOB: 10/31/1981, 36 y.o.   MRN: [id number] Plan for surgery on Friday for right fifth ray amputation.  The discharge plan of care from an orthopedic standpoint will be included on the operative note.

## 2017-10-30 NOTE — Progress Notes (Signed)
[facility]                                                                              Patient Demographics  Lindsay Love, is a 36 y.o. female, DOB - 09-10-1981, ZOX:[id number]  Admit date - 10/27/2017   Admitting Physician Lorretta Harp, MD  Outpatient Primary MD for the patient is Patient, No Pcp Per  Outpatient specialists:   LOS - 3  days   Medical records reviewed and are as summarized below:    Chief Complaint  Patient presents with  . Foot Pain       Brief summary  This 36 year old female history of poorly controlled diabetes mellitus with neuropathy, asthma, gastroesophageal reflux disease presented today ED with a 3-day history of worsening right foot swelling, pain increasing erythema and warmth.  Noted to have a cellulitis.  X-rays were negative for any osteomyelitis.  MRI of right foot consistent with osteomyelitis of the right fifth toe.  Patient on empiric IV vancomycin and IV cefepime.  Orthopedics, Dr. Lajoyce Corners consulted.   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  Hemoglobin A1c 13.0 on 12/2 -Plain films of the right foot negative for osteo-, MRI of the foot consistent with ostium mellitus of the right fifth toe -Continue IV vancomycin and cefepime -Dr. Lajoyce Corners consulted, recommended right foot fifth ray amputation on 12/7  Poorly controlled diabetes mellitus, diabetic neuropathy -A1c 13.0 on 12/2, on high-dose Lantus 100 units daily -Uncontrolled, increase Levemir to 70 units twice daily, meal coverage, sliding scale insulin, diabetic coordinator consulted  GERD Continue Pepcid  Hyponatremia -Likely pseudohyponatremia with elevated glucose levels, improving  History of asthma -Continue Dulera, scheduled nebs  Vaginal candidiasis -Continue oral Diflucan  Code Status: full  DVT Prophylaxis:   Lovenox Family Communication: Discussed in detail with the patient, all imaging results, lab results explained to the patient   Disposition Plan:   Time Spent in minutes  25 minutes  Procedures:    Consultants:   Orthopedic  Antimicrobials:      Medications  Scheduled Meds: . clotrimazole  1 Applicatorful Vaginal QHS  . enoxaparin (LOVENOX) injection  40 mg Subcutaneous Q24H  . famotidine  20 mg Oral BID  . fluconazole  100 mg Oral Daily  . fluticasone  2 spray Each Nare Daily  . gabapentin  600 mg Oral BID  . guaiFENesin  1,200 mg Oral BID  . hydrocerin   Topical BID  . insulin aspart  0-20 Units Subcutaneous TID WC  . insulin aspart  0-5 Units Subcutaneous QHS  . insulin aspart  25 Units Subcutaneous TID WC  . insulin detemir  64 Units Subcutaneous BID  . loratadine  10 mg Oral Daily  . mometasone-formoterol  2 puff Inhalation BID  . senna  1 tablet Oral BID   Continuous Infusions: . ceFEPime (MAXIPIME) IV Stopped (10/30/17 8119)  . vancomycin Stopped (10/30/17 0801)   PRN Meds:.acetaminophen, albuterol, HYDROcodone-homatropine, ketorolac, magnesium citrate, ondansetron (ZOFRAN) IV, ondansetron **OR** ondansetron (ZOFRAN) IV, oxyCODONE-acetaminophen, polyethylene glycol, prochlorperazine, sorbitol, zolpidem  Antibiotics   Anti-infectives (From admission, onward)   Start     Dose/Rate Route Frequency Ordered Stop   10/28/17 1800  vancomycin (VANCOCIN) 1,250 mg in sodium chloride 0.9 % 250 mL IVPB     1,250 mg 166.7 mL/hr over 90 Minutes Intravenous Every 12 hours 10/28/17 1049     10/28/17 1600  ceFEPIme (MAXIPIME) 1 g in dextrose 5 % 50 mL IVPB     1 g 100 mL/hr over 30 Minutes Intravenous Every 8 hours 10/28/17 1423     10/28/17 1000  fluconazole (DIFLUCAN) tablet 100 mg     100 mg Oral Daily 10/27/17 1918     10/27/17 2200  ciprofloxacin (CIPRO) IVPB 400 mg  Status:  Discontinued     400 mg 200 mL/hr over 60 Minutes Intravenous Every 12 hours 10/27/17  2014 10/28/17 1423   10/27/17 2000  fluconazole (DIFLUCAN) IVPB 200 mg     200 mg 100 mL/hr over 60 Minutes Intravenous  Once 10/27/17 1918 10/27/17 2309   10/27/17 2000  vancomycin (VANCOCIN) IVPB 1000 mg/200 mL premix  Status:  Discontinued     1,000 mg 200 mL/hr over 60 Minutes Intravenous Every 8 hours 10/27/17 1939 10/28/17 1049   10/27/17 1930  vancomycin (VANCOCIN) IVPB 1000 mg/200 mL premix  Status:  Discontinued     1,000 mg 200 mL/hr over 60 Minutes Intravenous  Once 10/27/17 1918 10/27/17 1920   10/27/17 0315  piperacillin-tazobactam (ZOSYN) IVPB 3.375 g     3.375 g 100 mL/hr over 30 Minutes Intravenous  Once 10/27/17 0301 10/27/17 0411   10/27/17 0315  vancomycin (VANCOCIN) IVPB 1000 mg/200 mL premix  Status:  Discontinued     1,000 mg 200 mL/hr over 60 Minutes Intravenous  Once 10/27/17 0301 10/27/17 0303   10/27/17 0315  vancomycin (VANCOCIN) IVPB 1000 mg/200 mL premix     1,000 mg 200 mL/hr over 60 Minutes Intravenous  Once 10/27/17 0305 10/27/17 0432   10/27/17 0315  vancomycin (VANCOCIN) IVPB 1000 mg/200 mL premix     1,000 mg 200 mL/hr over 60 Minutes Intravenous  Once 10/27/17 0305 10/27/17 0545        Subjective:   Lindsay Love was seen and examined today.  Denies any specific complaints.  Pain controlled. Patient denies dizziness, chest pain, shortness of breath, abdominal pain, N/V/D/C, new weakness, numbess, tingling. No acute events overnight.    Objective:   Vitals:   10/30/17 0457 10/30/17 0817 10/30/17 1100 10/30/17 1140  BP: (!) 141/73  (!) 172/84 (!) 170/78  Pulse: 95     Resp: 16     Temp: 98.4 F (36.9 C)     TempSrc: Oral     SpO2: 97% 97%    Weight:      Height:        Intake/Output Summary (Last 24 hours) at 10/30/2017 1217 Last data filed at 10/30/2017 1208 Gross per 24 hour  Intake 2560 ml  Output 5 ml  Net 2555 ml     Wt Readings from Last 3 Encounters:  10/29/17 97.5 kg (215 lb)  08/24/17 97.5 kg (215 lb)  08/19/17 97.5  kg (215 lb)     Exam  General: Alert and oriented x 3, NAD  Eyes:   HEENT:   Cardiovascular: S1 S2 auscultated, no rubs, murmurs or gallops. Regular rate and rhythm.  Respiratory: Clear to auscultation bilaterally, no wheezing, rales or rhonchi  Gastrointestinal: Soft, nontender, nondistended, + bowel sounds  Ext: no pedal edema bilaterally  Neuro: no new deficits  Musculoskeletal: No digital cyanosis, clubbing  Skin: Right fifth toe with swelling and erythema and ulceration.  Right foot with erythema improving per patient  Psych: Normal affect and demeanor, alert and oriented x3    Data Reviewed:  I have personally reviewed following labs and imaging studies  Micro Results Recent Results (from the past 240 hour(s))  Blood culture (routine x 2)     Status: None (Preliminary result)   Collection Time: 10/27/17  3:13 AM  Result Value Ref Range Status   Specimen Description BLOOD RIGHT ARM  Final   Special Requests   Final    BOTTLES DRAWN AEROBIC AND ANAEROBIC Blood Culture adequate volume   Culture   Final    NO GROWTH 2 DAYS Performed at [facility], [address] [place], [place] [address]    Report Status PENDING  Incomplete  Blood culture (routine x 2)     Status: None (Preliminary result)   Collection Time: 10/27/17  3:13 AM  Result Value Ref Range Status   Specimen Description BLOOD RIGHT WRIST  Final   Special Requests   Final    BOTTLES DRAWN AEROBIC AND ANAEROBIC Blood Culture adequate volume   Culture   Final    NO GROWTH 2 DAYS Performed at [facility], [address] [place], [place] [address]    Report Status PENDING  Incomplete  Urine culture     Status: Abnormal   Collection Time: 10/28/17  6:45 AM  Result Value Ref Range Status   Specimen Description URINE, CLEAN CATCH  Final   Special Requests NONE  Final   Culture (A)  Final    <10,000 COLONIES/mL INSIGNIFICANT GROWTH Performed at [facility], [address]  [place], [place] [address]    Report Status 10/29/2017 FINAL  Final    Radiology Reports Dg Chest 2 View  Result Date: 10/27/2017 CLINICAL DATA:  Initial evaluation for acute cough for 2 weeks. EXAM: CHEST  2 VIEW COMPARISON:  Prior radiograph from 06/12/2017. FINDINGS: The cardiac and mediastinal silhouettes are stable in size and contour, and remain within normal limits. The lungs are normally inflated. No airspace consolidation, pleural effusion, or pulmonary edema is identified. There is no pneumothorax. No acute osseous abnormality identified. IMPRESSION: No radiographic evidence for active cardiopulmonary disease. Electronically Signed   By: Rise MuBenjamin  McClintock M.D.   On: 10/27/2017 03:31   Mr Foot Right W Wo Contrast  Result Date: 10/28/2017 CLINICAL DATA:  Diabetic patient with a nonhealing wound on the right fifth toe. Question osteomyelitis. EXAM: MRI OF THE RIGHT FOREFOOT WITHOUT AND WITH CONTRAST TECHNIQUE: Multiplanar, multisequence MR imaging of the right forefoot was performed before and after the administration of intravenous contrast. CONTRAST:  20 ml MULTIHANCE GADOBENATE DIMEGLUMINE 529 MG/ML IV SOLN COMPARISON:  Plain films right foot 10/27/2017. MRI right foot 10/22/2017. FINDINGS: Bones/Joint/Cartilage There is intense marrow edema and enhancement throughout the fifth toe consistent with osteomyelitis. Bone marrow signal is otherwise normal. Ligaments Intact. Muscles and Tendons Intact. Mildly increased T2 signal in intrinsic musculature of the foot with minimal enhancement could be due to inflammatory change or early denervation atrophy. No intramuscular abscess. No muscle or tendon tear. Soft tissues Subcutaneous edema is present over the dorsum of the foot. No soft tissue abscess. No evidence of septic joint. IMPRESSION: Findings consistent with osteomyelitis throughout the fifth toe without abscess or septic joint. Mild edema and enhancement intrinsic musculature the foot could be  due to inflammatory change or early denervation atrophy. Electronically Signed   By: Drusilla Kannerhomas  Dalessio M.D.   On: 10/28/2017 09:23   Dg Foot Complete Right  Result Date: 10/27/2017 CLINICAL DATA:  Initial evaluation for acute pain. Soft tissue wounds at the right fifth toe and base of first MTP joint. EXAM: RIGHT FOOT COMPLETE - 3+ VIEW COMPARISON:  Prior radiograph from 07/02/2017. FINDINGS: No acute fracture or dislocation. Joint spaces maintained without evidence for significant degenerative or erosive arthropathy. Bone marrow signal intensity within normal limits. Soft tissue irregularity at the lateral margin of the right fifth PIP joint, suspicious for ulceration. No radiopaque foreign body. No evidence for acute osteomyelitis. No other appreciable soft tissue ulceration or abnormality identified. IMPRESSION: 1. Soft tissue ulceration at the lateral margin of the right fifth PIP joint. No evidence for associated osteomyelitis. 2. No other acute abnormality identified about the right foot. Electronically Signed   By: Rise MuBenjamin  McClintock M.D.   On: 10/27/2017 03:34    Lab Data:  CBC: Recent Labs  Lab 10/27/17 0249 10/27/17 1931 10/28/17 0552 10/29/17 0559  WBC 8.0 7.8 8.0 7.3  NEUTROABS 4.8 4.6  --  4.3  HGB 12.4 10.7* 11.6* 10.8*  HCT 36.1 31.5* 33.7* 31.9*  MCV 82.4 83.6 82.2 82.9  PLT 385 312 347 349   Basic Metabolic Panel: Recent Labs  Lab 10/27/17 0249 10/27/17 1931 10/28/17 0552 10/29/17 0559  NA 126* 134* 133* 131*  K 3.8 4.2 3.9 3.4*  CL 94* 106 102 101  CO2 23 24 25 23   GLUCOSE 594* 401* 319* 305*  BUN 15 9 6 10   CREATININE 0.96 0.68 0.64 0.68  CALCIUM 8.7* 7.9* 8.3* 8.5*  MG  --  1.8  --   --   PHOS  --  2.2* 3.6  --    GFR: Estimated Creatinine Clearance: 116.6 mL/min (by C-G formula based on SCr of 0.68 mg/dL). Liver Function Tests: Recent Labs  Lab 10/27/17 0249 10/27/17 1931  AST 23 17  ALT 13* 12*  ALKPHOS 83 70  BILITOT 0.5 0.4  PROT 7.8 6.8    ALBUMIN 3.0* 2.5*   No results for input(s): LIPASE, AMYLASE in the last 168 hours. No results for input(s): AMMONIA in the last 168 hours. Coagulation Profile: Recent Labs  Lab 10/27/17 0249 10/27/17 1931 10/28/17 0552  INR 1.56 0.96 0.98   Cardiac Enzymes: No results for input(s): CKTOTAL, CKMB, CKMBINDEX, TROPONINI in the last 168 hours. BNP (last 3 results) No results for input(s): PROBNP in the last 8760 hours. HbA1C: Recent Labs    10/27/17 1805  HGBA1C 13.0*   CBG: Recent Labs  Lab 10/29/17 1134 10/29/17 1706 10/29/17 2004 10/30/17 0735 10/30/17 1158  GLUCAP 349* 128* 148* 255* 217*   Lipid Profile: No results for input(s): CHOL, HDL, LDLCALC, TRIG, CHOLHDL, LDLDIRECT in the last 72 hours. Thyroid Function Tests: No results for input(s): TSH, T4TOTAL, FREET4, T3FREE, THYROIDAB in the last 72 hours. Anemia Panel: No results for input(s): VITAMINB12, FOLATE, FERRITIN, TIBC, IRON, RETICCTPCT in the last 72 hours. Urine analysis:    Component Value Date/Time   COLORURINE YELLOW 10/27/2017 0249   APPEARANCEUR CLEAR 10/27/2017 0249   LABSPEC 1.010 10/27/2017 0249   PHURINE 6.0 10/27/2017 0249   GLUCOSEU >=500 (A) 10/27/2017 0249   HGBUR MODERATE (A) 10/27/2017 0249   BILIRUBINUR NEGATIVE 10/27/2017 0249   KETONESUR NEGATIVE 10/27/2017 0249   PROTEINUR NEGATIVE 10/27/2017 0249   UROBILINOGEN 0.2 07/11/2014 1555   NITRITE NEGATIVE  10/27/2017 0249   LEUKOCYTESUR NEGATIVE 10/27/2017 0249     Ripudeep Rai M.D. [facility] 10/30/2017, 12:17 PM  Pager: [phone] Between 7am to 7pm - call Pager - [phone]  After 7pm go to [email or web address] - password [email or web address]  Call night coverage person covering after 7pm

## 2017-10-30 NOTE — Progress Notes (Signed)
Patient ID: Lindsay Love, female   DOB: 05/20/1981, 36 y.o.   MRN: [id number] Plan for surgery on Friday for right fifth ray amputation.  The discharge plan of care from an orthopedic standpoint will be included on the operative note. 

## 2017-10-31 LAB — GLUCOSE, CAPILLARY
GLUCOSE-CAPILLARY: 148 mg/dL — AB (ref 65–99)
GLUCOSE-CAPILLARY: 169 mg/dL — AB (ref 65–99)
Glucose-Capillary: 128 mg/dL — ABNORMAL HIGH (ref 65–99)
Glucose-Capillary: 150 mg/dL — ABNORMAL HIGH (ref 65–99)

## 2017-10-31 MED ORDER — CEFAZOLIN SODIUM-DEXTROSE 2-4 GM/100ML-% IV SOLN
2.0000 g | INTRAVENOUS | Status: AC
  Administered 2017-11-01: 2 g via INTRAVENOUS
  Filled 2017-10-31: qty 100

## 2017-10-31 MED ORDER — CHLORHEXIDINE GLUCONATE 4 % EX LIQD: 60.0000 mL | Freq: Once | CUTANEOUS | Status: DC

## 2017-10-31 NOTE — Progress Notes (Signed)
[facility]                                                                              Patient Demographics  Lindsay Love, is a 36 y.o. female, DOB - 1981-10-25, ZOX:[id number]  Admit date - 10/27/2017   Admitting Physician Lorretta Harp, MD  Outpatient Primary MD for the patient is Patient, No Pcp Per  Outpatient specialists:   LOS - 4  days   Medical records reviewed and are as summarized below:    Chief Complaint  Patient presents with  . Foot Pain       Brief summary  This 36 year old female history of poorly controlled diabetes mellitus with neuropathy, asthma, gastroesophageal reflux disease presented today ED with a 3-day history of worsening right foot swelling, pain increasing erythema and warmth.  Noted to have a cellulitis.  X-rays were negative for any osteomyelitis.  MRI of right foot consistent with osteomyelitis of the right fifth toe.  Patient on empiric IV vancomycin and IV cefepime.  Orthopedics, Dr. Lajoyce Corners consulted.   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  Hemoglobin A1c 13.0 on 12/2 -Plain films of the right foot negative for osteo-, MRI of the foot consistent with ostium mellitus of the right fifth toe -Continue IV vancomycin and cefepime -Dr. do the following, right foot fifth ray amputation on 12/7, n.p.o. after midnight tonight   Poorly controlled diabetes mellitus, diabetic neuropathy -A1c 13.0 on 12/2, on high-dose Lantus 100 units daily -CBGs improving, continue levemir to 70 units twice daily, meal coverage, sliding scale insulin -  diabetic coordinator consulted  GERD Continue Pepcid  Hyponatremia -Likely pseudohyponatremia with elevated glucose levels, improving  History of asthma -Continue Dulera, scheduled nebs  Vaginal candidiasis -Continue oral Diflucan  Code Status: full    DVT Prophylaxis:  Lovenox Family Communication: Discussed in detail with the patient, all imaging results, lab results explained to the patient   Disposition Plan:   Time Spent in minutes  25 minutes  Procedures:    Consultants:   Orthopedic  Antimicrobials:      Medications  Scheduled Meds: . clotrimazole  1 Applicatorful Vaginal QHS  . enoxaparin (LOVENOX) injection  40 mg Subcutaneous Q24H  . famotidine  20 mg Oral BID  . fluconazole  100 mg Oral Daily  . fluticasone  2 spray Each Nare Daily  . gabapentin  600 mg Oral BID  . guaiFENesin  1,200 mg Oral BID  . hydrocerin   Topical BID  . insulin aspart  0-20 Units Subcutaneous TID WC  . insulin aspart  0-5 Units Subcutaneous QHS  . insulin aspart  25 Units Subcutaneous TID WC  . insulin detemir  70 Units Subcutaneous BID  . loratadine  10 mg Oral Daily  . mometasone-formoterol  2 puff Inhalation BID  . senna  1 tablet Oral BID   Continuous Infusions: . ceFEPime (MAXIPIME) IV Stopped (10/31/17 8119)  . vancomycin Stopped (10/31/17 0740)   PRN Meds:.acetaminophen, albuterol, HYDROcodone-homatropine, ketorolac, magnesium citrate, ondansetron (ZOFRAN) IV, ondansetron **OR** ondansetron (  ZOFRAN) IV, oxyCODONE-acetaminophen, polyethylene glycol, prochlorperazine, sorbitol, zolpidem   Antibiotics   Anti-infectives (From admission, onward)   Start     Dose/Rate Route Frequency Ordered Stop   10/28/17 1800  vancomycin (VANCOCIN) 1,250 mg in sodium chloride 0.9 % 250 mL IVPB     1,250 mg 166.7 mL/hr over 90 Minutes Intravenous Every 12 hours 10/28/17 1049     10/28/17 1600  ceFEPIme (MAXIPIME) 1 g in dextrose 5 % 50 mL IVPB     1 g 100 mL/hr over 30 Minutes Intravenous Every 8 hours 10/28/17 1423     10/28/17 1000  fluconazole (DIFLUCAN) tablet 100 mg     100 mg Oral Daily 10/27/17 1918     10/27/17 2200  ciprofloxacin (CIPRO) IVPB 400 mg  Status:  Discontinued     400 mg 200 mL/hr over 60 Minutes Intravenous Every  12 hours 10/27/17 2014 10/28/17 1423   10/27/17 2000  fluconazole (DIFLUCAN) IVPB 200 mg     200 mg 100 mL/hr over 60 Minutes Intravenous  Once 10/27/17 1918 10/27/17 2309   10/27/17 2000  vancomycin (VANCOCIN) IVPB 1000 mg/200 mL premix  Status:  Discontinued     1,000 mg 200 mL/hr over 60 Minutes Intravenous Every 8 hours 10/27/17 1939 10/28/17 1049   10/27/17 1930  vancomycin (VANCOCIN) IVPB 1000 mg/200 mL premix  Status:  Discontinued     1,000 mg 200 mL/hr over 60 Minutes Intravenous  Once 10/27/17 1918 10/27/17 1920   10/27/17 0315  piperacillin-tazobactam (ZOSYN) IVPB 3.375 g     3.375 g 100 mL/hr over 30 Minutes Intravenous  Once 10/27/17 0301 10/27/17 0411   10/27/17 0315  vancomycin (VANCOCIN) IVPB 1000 mg/200 mL premix  Status:  Discontinued     1,000 mg 200 mL/hr over 60 Minutes Intravenous  Once 10/27/17 0301 10/27/17 0303   10/27/17 0315  vancomycin (VANCOCIN) IVPB 1000 mg/200 mL premix     1,000 mg 200 mL/hr over 60 Minutes Intravenous  Once 10/27/17 0305 10/27/17 0432   10/27/17 0315  vancomycin (VANCOCIN) IVPB 1000 mg/200 mL premix     1,000 mg 200 mL/hr over 60 Minutes Intravenous  Once 10/27/17 0305 10/27/17 0545        Subjective:   Lindsay Love was seen and examined today.  No complaints today.  Patient denies dizziness, chest pain, shortness of breath, abdominal pain, N/V/D/C, new weakness, numbess, tingling. No acute events overnight.    Objective:   Vitals:   10/30/17 2149 10/31/17 0455 10/31/17 0500 10/31/17 0711  BP: (!) 155/84 132/72    Pulse: 86 88    Resp: 16 17    Temp: 98.2 F (36.8 C) 98.3 F (36.8 C)    TempSrc: Oral Oral    SpO2: 99% 97%  100%  Weight:   100 kg (220 lb 7.4 oz)   Height:        Intake/Output Summary (Last 24 hours) at 10/31/2017 1226 Last data filed at 10/31/2017 1610 Gross per 24 hour  Intake 1050 ml  Output -  Net 1050 ml     Wt Readings from Last 3 Encounters:  10/31/17 100 kg (220 lb 7.4 oz)  08/24/17  97.5 kg (215 lb)  08/19/17 97.5 kg (215 lb)     Exam   General: Alert and oriented x 3, NAD  Eyes:   HEENT:   Cardiovascular: S1 S2 clear, RRR. No pedal edema b/l  Respiratory: Clear to auscultation bilaterally, no wheezing, rales or rhonchi  Gastrointestinal: Soft,  nontender, nondistended, + bowel sounds  Ext: no pedal edema bilaterally  Neuro: no new deficits  Musculoskeletal: No digital cyanosis, clubbing  Skin: Right foot with erythema, tenderness improving  Psych: Normal affect and demeanor, alert and oriented x3    Data Reviewed:  I have personally reviewed following labs and imaging studies  Micro Results Recent Results (from the past 240 hour(s))  Blood culture (routine x 2)     Status: None (Preliminary result)   Collection Time: 10/27/17  3:13 AM  Result Value Ref Range Status   Specimen Description BLOOD RIGHT ARM  Final   Special Requests   Final    BOTTLES DRAWN AEROBIC AND ANAEROBIC Blood Culture adequate volume   Culture   Final    NO GROWTH 4 DAYS Performed at [facility], [address] [place], [place] [address]    Report Status PENDING  Incomplete  Blood culture (routine x 2)     Status: None (Preliminary result)   Collection Time: 10/27/17  3:13 AM  Result Value Ref Range Status   Specimen Description BLOOD RIGHT WRIST  Final   Special Requests   Final    BOTTLES DRAWN AEROBIC AND ANAEROBIC Blood Culture adequate volume   Culture   Final    NO GROWTH 4 DAYS Performed at [facility], [address] [place], [place] [address]    Report Status PENDING  Incomplete  Urine culture     Status: Abnormal   Collection Time: 10/28/17  6:45 AM  Result Value Ref Range Status   Specimen Description URINE, CLEAN CATCH  Final   Special Requests NONE  Final   Culture (A)  Final    <10,000 COLONIES/mL INSIGNIFICANT GROWTH Performed at [facility], [address] [place], [place] [address]    Report Status 10/29/2017 FINAL   Final    Radiology Reports Dg Chest 2 View  Result Date: 10/27/2017 CLINICAL DATA:  Initial evaluation for acute cough for 2 weeks. EXAM: CHEST  2 VIEW COMPARISON:  Prior radiograph from 06/12/2017. FINDINGS: The cardiac and mediastinal silhouettes are stable in size and contour, and remain within normal limits. The lungs are normally inflated. No airspace consolidation, pleural effusion, or pulmonary edema is identified. There is no pneumothorax. No acute osseous abnormality identified. IMPRESSION: No radiographic evidence for active cardiopulmonary disease. Electronically Signed   By: Rise MuBenjamin  McClintock M.D.   On: 10/27/2017 03:31   Mr Foot Right W Wo Contrast  Result Date: 10/28/2017 CLINICAL DATA:  Diabetic patient with a nonhealing wound on the right fifth toe. Question osteomyelitis. EXAM: MRI OF THE RIGHT FOREFOOT WITHOUT AND WITH CONTRAST TECHNIQUE: Multiplanar, multisequence MR imaging of the right forefoot was performed before and after the administration of intravenous contrast. CONTRAST:  20 ml MULTIHANCE GADOBENATE DIMEGLUMINE 529 MG/ML IV SOLN COMPARISON:  Plain films right foot 10/27/2017. MRI right foot 10/22/2017. FINDINGS: Bones/Joint/Cartilage There is intense marrow edema and enhancement throughout the fifth toe consistent with osteomyelitis. Bone marrow signal is otherwise normal. Ligaments Intact. Muscles and Tendons Intact. Mildly increased T2 signal in intrinsic musculature of the foot with minimal enhancement could be due to inflammatory change or early denervation atrophy. No intramuscular abscess. No muscle or tendon tear. Soft tissues Subcutaneous edema is present over the dorsum of the foot. No soft tissue abscess. No evidence of septic joint. IMPRESSION: Findings consistent with osteomyelitis throughout the fifth toe without abscess or septic joint. Mild edema and enhancement intrinsic musculature the foot could  be due to inflammatory change or early denervation atrophy.  Electronically Signed   By: Drusilla Kannerhomas  Dalessio M.D.   On: 10/28/2017 09:23   Dg Foot Complete Right  Result Date: 10/27/2017 CLINICAL DATA:  Initial evaluation for acute pain. Soft tissue wounds at the right fifth toe and base of first MTP joint. EXAM: RIGHT FOOT COMPLETE - 3+ VIEW COMPARISON:  Prior radiograph from 07/02/2017. FINDINGS: No acute fracture or dislocation. Joint spaces maintained without evidence for significant degenerative or erosive arthropathy. Bone marrow signal intensity within normal limits. Soft tissue irregularity at the lateral margin of the right fifth PIP joint, suspicious for ulceration. No radiopaque foreign body. No evidence for acute osteomyelitis. No other appreciable soft tissue ulceration or abnormality identified. IMPRESSION: 1. Soft tissue ulceration at the lateral margin of the right fifth PIP joint. No evidence for associated osteomyelitis. 2. No other acute abnormality identified about the right foot. Electronically Signed   By: Rise MuBenjamin  McClintock M.D.   On: 10/27/2017 03:34    Lab Data:  CBC: Recent Labs  Lab 10/27/17 0249 10/27/17 1931 10/28/17 0552 10/29/17 0559  WBC 8.0 7.8 8.0 7.3  NEUTROABS 4.8 4.6  --  4.3  HGB 12.4 10.7* 11.6* 10.8*  HCT 36.1 31.5* 33.7* 31.9*  MCV 82.4 83.6 82.2 82.9  PLT 385 312 347 349   Basic Metabolic Panel: Recent Labs  Lab 10/27/17 0249 10/27/17 1931 10/28/17 0552 10/29/17 0559  NA 126* 134* 133* 131*  K 3.8 4.2 3.9 3.4*  CL 94* 106 102 101  CO2 23 24 25 23   GLUCOSE 594* 401* 319* 305*  BUN 15 9 6 10   CREATININE 0.96 0.68 0.64 0.68  CALCIUM 8.7* 7.9* 8.3* 8.5*  MG  --  1.8  --   --   PHOS  --  2.2* 3.6  --    GFR: Estimated Creatinine Clearance: 118.2 mL/min (by C-G formula based on SCr of 0.68 mg/dL). Liver Function Tests: Recent Labs  Lab 10/27/17 0249 10/27/17 1931  AST 23 17  ALT 13* 12*  ALKPHOS 83 70  BILITOT 0.5 0.4  PROT 7.8 6.8  ALBUMIN 3.0* 2.5*   No results for input(s): LIPASE,  AMYLASE in the last 168 hours. No results for input(s): AMMONIA in the last 168 hours. Coagulation Profile: Recent Labs  Lab 10/27/17 0249 10/27/17 1931 10/28/17 0552  INR 1.56 0.96 0.98   Cardiac Enzymes: No results for input(s): CKTOTAL, CKMB, CKMBINDEX, TROPONINI in the last 168 hours. BNP (last 3 results) No results for input(s): PROBNP in the last 8760 hours. HbA1C: No results for input(s): HGBA1C in the last 72 hours. CBG: Recent Labs  Lab 10/30/17 0735 10/30/17 1158 10/30/17 1646 10/30/17 2223 10/31/17 0804  GLUCAP 255* 217* 241* 179* 128*   Lipid Profile: No results for input(s): CHOL, HDL, LDLCALC, TRIG, CHOLHDL, LDLDIRECT in the last 72 hours. Thyroid Function Tests: No results for input(s): TSH, T4TOTAL, FREET4, T3FREE, THYROIDAB in the last 72 hours. Anemia Panel: No results for input(s): VITAMINB12, FOLATE, FERRITIN, TIBC, IRON, RETICCTPCT in the last 72 hours. Urine analysis:    Component Value Date/Time   COLORURINE YELLOW 10/27/2017 0249   APPEARANCEUR CLEAR 10/27/2017 0249   LABSPEC 1.010 10/27/2017 0249   PHURINE 6.0 10/27/2017 0249   GLUCOSEU >=500 (A) 10/27/2017 0249   HGBUR MODERATE (A) 10/27/2017 0249   BILIRUBINUR NEGATIVE 10/27/2017 0249   KETONESUR NEGATIVE 10/27/2017 0249   PROTEINUR NEGATIVE 10/27/2017 0249   UROBILINOGEN 0.2 07/11/2014 1555   NITRITE NEGATIVE 10/27/2017  0249   LEUKOCYTESUR NEGATIVE 10/27/2017 0249     Thad Ranger M.D. [facility] 10/31/2017, 12:26 PM  Pager: [phone] Between 7am to 7pm - call Pager - [phone]  After 7pm go to [email or web address] - password [email or web address]  Call night coverage person covering after 7pm

## 2017-11-01 ENCOUNTER — Encounter (HOSPITAL_COMMUNITY)
Admission: EM | Disposition: A | Discharge status: Home / Self Care | Payer: Self-pay | Source: Home / Self Care | Attending: Internal Medicine

## 2017-11-01 ENCOUNTER — Encounter (HOSPITAL_COMMUNITY): Payer: Self-pay

## 2017-11-01 ENCOUNTER — Inpatient Hospital Stay (HOSPITAL_COMMUNITY): Payer: Medicaid Other | Admitting: Certified Registered"

## 2017-11-01 HISTORY — PX: AMPUTATION: SHX166

## 2017-11-01 LAB — CBC WITH DIFFERENTIAL/PLATELET
BASOS ABS: 0 10*3/uL (ref 0.0–0.1)
Basophils Relative: 0 %
EOS ABS: 0.1 10*3/uL (ref 0.0–0.7)
Eosinophils Relative: 1 %
HEMATOCRIT: 36.1 % (ref 36.0–46.0)
HEMOGLOBIN: 12 g/dL (ref 12.0–15.0)
LYMPHS PCT: 20 %
Lymphs Abs: 1.7 10*3/uL (ref 0.7–4.0)
MCH: 28.4 pg (ref 26.0–34.0)
MCHC: 33.2 g/dL (ref 30.0–36.0)
MCV: 85.5 fL (ref 78.0–100.0)
MONOS PCT: 3 %
Monocytes Absolute: 0.3 10*3/uL (ref 0.1–1.0)
NEUTROS ABS: 6.6 10*3/uL (ref 1.7–7.7)
NEUTROS PCT: 76 %
Platelets: 370 10*3/uL (ref 150–400)
RBC: 4.22 MIL/uL (ref 3.87–5.11)
RDW: 14.5 % (ref 11.5–15.5)
WBC: 8.7 10*3/uL (ref 4.0–10.5)

## 2017-11-01 LAB — GLUCOSE, CAPILLARY
GLUCOSE-CAPILLARY: 164 mg/dL — AB (ref 65–99)
GLUCOSE-CAPILLARY: 176 mg/dL — AB (ref 65–99)
GLUCOSE-CAPILLARY: 200 mg/dL — AB (ref 65–99)
GLUCOSE-CAPILLARY: 253 mg/dL — AB (ref 65–99)
GLUCOSE-CAPILLARY: 278 mg/dL — AB (ref 65–99)
GLUCOSE-CAPILLARY: 310 mg/dL — AB (ref 65–99)

## 2017-11-01 LAB — BASIC METABOLIC PANEL
ANION GAP: 8 (ref 5–15)
BUN: 9 mg/dL (ref 6–20)
CHLORIDE: 104 mmol/L (ref 101–111)
CO2: 23 mmol/L (ref 22–32)
CREATININE: 0.64 mg/dL (ref 0.44–1.00)
Calcium: 8.5 mg/dL — ABNORMAL LOW (ref 8.9–10.3)
GFR calc non Af Amer: >60 mL/min (ref >60)
Glucose, Bld: 156 mg/dL — ABNORMAL HIGH (ref 65–99)
Potassium: 4.4 mmol/L (ref 3.5–5.1)
SODIUM: 135 mmol/L (ref 135–145)

## 2017-11-01 LAB — SURGICAL PCR SCREEN
MRSA, PCR: POSITIVE — AB
STAPHYLOCOCCUS AUREUS: POSITIVE — AB

## 2017-11-01 LAB — CULTURE, BLOOD (ROUTINE X 2)
CULTURE: NO GROWTH
CULTURE: NO GROWTH
SPECIAL REQUESTS: ADEQUATE
SPECIAL REQUESTS: ADEQUATE

## 2017-11-01 SURGERY — AMPUTATION, FOOT, RAY
Anesthesia: General | Laterality: Right

## 2017-11-01 MED ORDER — POLYETHYLENE GLYCOL 3350 17 G PO PACK: 17.0000 g | PACK | Freq: Every day | ORAL | Status: DC | PRN

## 2017-11-01 MED ORDER — PROPOFOL 10 MG/ML IV BOLUS
INTRAVENOUS | Status: AC
  Filled 2017-11-01: qty 20

## 2017-11-01 MED ORDER — INSULIN ASPART 100 UNIT/ML ~~LOC~~ SOLN
30.0000 [IU] | Freq: Three times a day (TID) | SUBCUTANEOUS | Status: DC
  Administered 2017-11-01: 30 [IU] via SUBCUTANEOUS

## 2017-11-01 MED ORDER — METOCLOPRAMIDE HCL 5 MG/ML IJ SOLN: 5.0000 mg | Freq: Three times a day (TID) | INTRAMUSCULAR | Status: DC | PRN

## 2017-11-01 MED ORDER — 0.9 % SODIUM CHLORIDE (POUR BTL) OPTIME
TOPICAL | Status: DC | PRN
  Administered 2017-11-01: 1000 mL

## 2017-11-01 MED ORDER — FENTANYL CITRATE (PF) 250 MCG/5ML IJ SOLN
INTRAMUSCULAR | Status: AC
  Filled 2017-11-01: qty 5

## 2017-11-01 MED ORDER — SODIUM CHLORIDE 0.9 % IV SOLN
INTRAVENOUS | Status: DC
  Administered 2017-11-01: 18:00:00 via INTRAVENOUS

## 2017-11-01 MED ORDER — MUPIROCIN 2 % EX OINT
1.0000 "application " | TOPICAL_OINTMENT | Freq: Two times a day (BID) | CUTANEOUS | Status: DC
  Administered 2017-11-01 – 2017-11-02 (×4): 1 via NASAL
  Filled 2017-11-01: qty 22

## 2017-11-01 MED ORDER — HYDROMORPHONE HCL 1 MG/ML IJ SOLN: 0.2500 mg | INTRAMUSCULAR | Status: DC | PRN

## 2017-11-01 MED ORDER — OXYCODONE HCL 5 MG PO TABS
10.0000 mg | ORAL_TABLET | ORAL | Status: DC | PRN
  Administered 2017-11-01 – 2017-11-02 (×2): 10 mg via ORAL
  Filled 2017-11-01 (×2): qty 2

## 2017-11-01 MED ORDER — DOCUSATE SODIUM 100 MG PO CAPS
100.0000 mg | ORAL_CAPSULE | Freq: Two times a day (BID) | ORAL | Status: DC
  Administered 2017-11-01 – 2017-11-02 (×2): 100 mg via ORAL
  Filled 2017-11-01 (×2): qty 1

## 2017-11-01 MED ORDER — ONDANSETRON HCL 4 MG/2ML IJ SOLN: 4.0000 mg | Freq: Four times a day (QID) | INTRAMUSCULAR | Status: DC | PRN

## 2017-11-01 MED ORDER — MAGNESIUM CITRATE PO SOLN: 1.0000 | Freq: Once | ORAL | Status: DC | PRN

## 2017-11-01 MED ORDER — LIDOCAINE 2% (20 MG/ML) 5 ML SYRINGE
INTRAMUSCULAR | Status: AC
  Filled 2017-11-01: qty 5

## 2017-11-01 MED ORDER — DEXAMETHASONE SODIUM PHOSPHATE 10 MG/ML IJ SOLN
INTRAMUSCULAR | Status: DC | PRN
Start: 2017-11-01 — End: 2017-11-01
  Administered 2017-11-01: 5 mg via INTRAVENOUS

## 2017-11-01 MED ORDER — BISACODYL 10 MG RE SUPP: 10.0000 mg | Freq: Every day | RECTAL | Status: DC | PRN

## 2017-11-01 MED ORDER — MIDAZOLAM HCL 5 MG/5ML IJ SOLN
INTRAMUSCULAR | Status: DC | PRN
  Administered 2017-11-01: 2 mg via INTRAVENOUS

## 2017-11-01 MED ORDER — METHOCARBAMOL 1000 MG/10ML IJ SOLN: 500.0000 mg | Freq: Four times a day (QID) | INTRAVENOUS | Status: DC | PRN

## 2017-11-01 MED ORDER — OXYCODONE HCL 5 MG PO TABS: 5.0000 mg | ORAL_TABLET | Freq: Once | ORAL | Status: DC | PRN

## 2017-11-01 MED ORDER — LACTATED RINGERS IV SOLN
INTRAVENOUS | Status: DC
  Administered 2017-11-01 (×2): via INTRAVENOUS

## 2017-11-01 MED ORDER — LIDOCAINE 2% (20 MG/ML) 5 ML SYRINGE
INTRAMUSCULAR | Status: DC | PRN
  Administered 2017-11-01: 100 mg via INTRAVENOUS

## 2017-11-01 MED ORDER — ONDANSETRON HCL 4 MG PO TABS: 4.0000 mg | ORAL_TABLET | Freq: Four times a day (QID) | ORAL | Status: DC | PRN

## 2017-11-01 MED ORDER — PROPOFOL 10 MG/ML IV BOLUS
INTRAVENOUS | Status: DC | PRN
  Administered 2017-11-01: 180 mg via INTRAVENOUS

## 2017-11-01 MED ORDER — OXYCODONE HCL 5 MG/5ML PO SOLN: 5.0000 mg | Freq: Once | ORAL | Status: DC | PRN

## 2017-11-01 MED ORDER — ACETAMINOPHEN 650 MG RE SUPP: 650.0000 mg | RECTAL | Status: DC | PRN

## 2017-11-01 MED ORDER — FENTANYL CITRATE (PF) 250 MCG/5ML IJ SOLN
INTRAMUSCULAR | Status: DC | PRN
  Administered 2017-11-01: 25 ug via INTRAVENOUS

## 2017-11-01 MED ORDER — HYDROCODONE-ACETAMINOPHEN 5-325 MG PO TABS
1.0000 | ORAL_TABLET | ORAL | Status: DC | PRN
  Administered 2017-11-02: 1 via ORAL
  Filled 2017-11-01: qty 1

## 2017-11-01 MED ORDER — ACETAMINOPHEN 325 MG PO TABS: 650.0000 mg | ORAL_TABLET | ORAL | Status: DC | PRN

## 2017-11-01 MED ORDER — CHLORHEXIDINE GLUCONATE CLOTH 2 % EX PADS
6.0000 | MEDICATED_PAD | Freq: Every day | CUTANEOUS | Status: DC
  Administered 2017-11-01 – 2017-11-02 (×2): 6 via TOPICAL

## 2017-11-01 MED ORDER — ONDANSETRON HCL 4 MG/2ML IJ SOLN
INTRAMUSCULAR | Status: DC | PRN
  Administered 2017-11-01: 4 mg via INTRAVENOUS

## 2017-11-01 MED ORDER — MIDAZOLAM HCL 2 MG/2ML IJ SOLN
INTRAMUSCULAR | Status: AC
  Filled 2017-11-01: qty 2

## 2017-11-01 MED ORDER — METHOCARBAMOL 500 MG PO TABS: 500.0000 mg | ORAL_TABLET | Freq: Four times a day (QID) | ORAL | Status: DC | PRN

## 2017-11-01 MED ORDER — METOCLOPRAMIDE HCL 5 MG PO TABS: 5.0000 mg | ORAL_TABLET | Freq: Three times a day (TID) | ORAL | Status: DC | PRN

## 2017-11-01 MED ORDER — EPHEDRINE 5 MG/ML INJ
INTRAVENOUS | Status: AC
  Filled 2017-11-01: qty 10

## 2017-11-01 SURGICAL SUPPLY — 30 items
BLADE SAW SGTL MED 73X18.5 STR (BLADE) IMPLANT
BLADE SURG 21 STRL SS (BLADE) ×3 IMPLANT
BNDG COHESIVE 4X5 TAN STRL (GAUZE/BANDAGES/DRESSINGS) ×1 IMPLANT
BNDG COHESIVE 6X5 TAN STRL LF (GAUZE/BANDAGES/DRESSINGS) ×2 IMPLANT
BNDG GAUZE ELAST 4 BULKY (GAUZE/BANDAGES/DRESSINGS) ×3 IMPLANT
COVER SURGICAL LIGHT HANDLE (MISCELLANEOUS) ×4 IMPLANT
DRAPE U-SHAPE 47X51 STRL (DRAPES) ×4 IMPLANT
DRSG ADAPTIC 3X8 NADH LF (GAUZE/BANDAGES/DRESSINGS) ×3 IMPLANT
DRSG PAD ABDOMINAL 8X10 ST (GAUZE/BANDAGES/DRESSINGS) ×2 IMPLANT
DURAPREP 26ML APPLICATOR (WOUND CARE) ×3 IMPLANT
ELECT REM PT RETURN 9FT ADLT (ELECTROSURGICAL) ×3
ELECTRODE REM PT RTRN 9FT ADLT (ELECTROSURGICAL) ×1 IMPLANT
GAUZE SPONGE 4X4 12PLY STRL (GAUZE/BANDAGES/DRESSINGS) ×1 IMPLANT
GAUZE SPONGE 4X4 12PLY STRL LF (GAUZE/BANDAGES/DRESSINGS) ×2 IMPLANT
GLOVE BIOGEL PI IND STRL 9 (GLOVE) ×1 IMPLANT
GLOVE BIOGEL PI INDICATOR 9 (GLOVE) ×2
GLOVE SURG ORTHO 9.0 STRL STRW (GLOVE) ×3 IMPLANT
GOWN STRL REUS W/ TWL XL LVL3 (GOWN DISPOSABLE) ×2 IMPLANT
GOWN STRL REUS W/TWL XL LVL3 (GOWN DISPOSABLE) ×6
KIT BASIN OR (CUSTOM PROCEDURE TRAY) ×3 IMPLANT
KIT ROOM TURNOVER OR (KITS) ×3 IMPLANT
NS IRRIG 1000ML POUR BTL (IV SOLUTION) ×3 IMPLANT
PACK ORTHO EXTREMITY (CUSTOM PROCEDURE TRAY) ×3 IMPLANT
PAD ABD 8X10 STRL (GAUZE/BANDAGES/DRESSINGS) ×2 IMPLANT
PAD ARMBOARD 7.5X6 YLW CONV (MISCELLANEOUS) ×6 IMPLANT
STOCKINETTE IMPERVIOUS LG (DRAPES) IMPLANT
SUT ETHILON 2 0 PSLX (SUTURE) ×3 IMPLANT
TUBE CONNECTING 12'X1/4 (SUCTIONS) ×1
TUBE CONNECTING 12X1/4 (SUCTIONS) ×2 IMPLANT
YANKAUER SUCT BULB TIP NO VENT (SUCTIONS) ×3 IMPLANT

## 2017-11-01 NOTE — Progress Notes (Signed)
[facility]                                                                              Patient Demographics  Lindsay Love, is a 36 y.o. female, DOB - [birth year], ZOX:[id number]  Admit date - 10/27/2017   Admitting Physician Lorretta Harp, MD  Outpatient Primary MD for the patient is Patient, No Pcp Per  Outpatient specialists:   LOS - 5  days   Medical records reviewed and are as summarized below:    Chief Complaint  Patient presents with  . Foot Pain       Brief summary  This 36 year old female history of poorly controlled diabetes mellitus with neuropathy, asthma, gastroesophageal reflux disease presented today ED with a 3-day history of worsening right foot swelling, pain increasing erythema and warmth.  Noted to have a cellulitis.  X-rays were negative for any osteomyelitis.  MRI of right foot consistent with osteomyelitis of the right fifth toe.  Patient on empiric IV vancomycin and IV cefepime.  Orthopedics, Dr. Lajoyce Corners consulted.   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  Hemoglobin A1c 13.0 on 12/2 -Plain films of the right foot negative for osteo, MRI of the foot consistent with osteomyelitis of the right fifth toe -Continue IV vancomycin and cefepime -Plan for right fifth ray amputation today, Dr Lajoyce Corners following.NPO   Poorly controlled diabetes mellitus, diabetic neuropathy -A1c 13.0 on 12/2, on high-dose Lantus 100 units daily -CBG still uncontrolled, continue levemir to 70 units twice daily, increase meal coverage to 30 units NovoLog 3 times daily.  Continue sliding scale insulin  -  diabetic coordinator consulted  GERD Continue Pepcid  Hyponatremia -Follow BMET in a.m.  History of asthma -Continue Dulera, scheduled nebs  Vaginal candidiasis -Continue oral Diflucan  Code Status: full  DVT  Prophylaxis:  Lovenox Family Communication: Discussed in detail with the patient, all imaging results, lab results explained to the patient   Disposition Plan:   Time Spent in minutes  25 minutes  Procedures:    Consultants:   Orthopedic  Antimicrobials:      Medications  Scheduled Meds: . chlorhexidine  60 mL Topical Once  . Chlorhexidine Gluconate Cloth  6 each Topical Q0600  . clotrimazole  1 Applicatorful Vaginal QHS  . enoxaparin (LOVENOX) injection  40 mg Subcutaneous Q24H  . famotidine  20 mg Oral BID  . fluconazole  100 mg Oral Daily  . fluticasone  2 spray Each Nare Daily  . gabapentin  600 mg Oral BID  . guaiFENesin  1,200 mg Oral BID  . hydrocerin   Topical BID  . insulin aspart  0-20 Units Subcutaneous TID WC  . insulin aspart  0-5 Units Subcutaneous QHS  . insulin aspart  25 Units Subcutaneous TID WC  . insulin detemir  70 Units Subcutaneous BID  . loratadine  10 mg Oral Daily  . mometasone-formoterol  2 puff Inhalation BID  . mupirocin ointment  1 application Nasal BID  . senna  1 tablet Oral BID   Continuous  Infusions: .  ceFAZolin (ANCEF) IV    . ceFEPime (MAXIPIME) IV Stopped (11/01/17 16100928)  . vancomycin Stopped (11/01/17 0703)   PRN Meds:.acetaminophen, albuterol, HYDROcodone-homatropine, ketorolac, magnesium citrate, ondansetron (ZOFRAN) IV, ondansetron **OR** ondansetron (ZOFRAN) IV, oxyCODONE-acetaminophen, polyethylene glycol, prochlorperazine, sorbitol, zolpidem   Antibiotics   Anti-infectives (From admission, onward)   Start     Dose/Rate Route Frequency Ordered Stop   11/01/17 0800  ceFAZolin (ANCEF) IVPB 2g/100 mL premix     2 g 200 mL/hr over 30 Minutes Intravenous To ShortStay Surgical 10/31/17 2333 11/02/17 0800   10/28/17 1800  vancomycin (VANCOCIN) 1,250 mg in sodium chloride 0.9 % 250 mL IVPB     1,250 mg 166.7 mL/hr over 90 Minutes Intravenous Every 12 hours 10/28/17 1049     10/28/17 1600  ceFEPIme (MAXIPIME) 1 g in dextrose  5 % 50 mL IVPB     1 g 100 mL/hr over 30 Minutes Intravenous Every 8 hours 10/28/17 1423     10/28/17 1000  fluconazole (DIFLUCAN) tablet 100 mg     100 mg Oral Daily 10/27/17 1918     10/27/17 2200  ciprofloxacin (CIPRO) IVPB 400 mg  Status:  Discontinued     400 mg 200 mL/hr over 60 Minutes Intravenous Every 12 hours 10/27/17 2014 10/28/17 1423   10/27/17 2000  fluconazole (DIFLUCAN) IVPB 200 mg     200 mg 100 mL/hr over 60 Minutes Intravenous  Once 10/27/17 1918 10/27/17 2309   10/27/17 2000  vancomycin (VANCOCIN) IVPB 1000 mg/200 mL premix  Status:  Discontinued     1,000 mg 200 mL/hr over 60 Minutes Intravenous Every 8 hours 10/27/17 1939 10/28/17 1049   10/27/17 1930  vancomycin (VANCOCIN) IVPB 1000 mg/200 mL premix  Status:  Discontinued     1,000 mg 200 mL/hr over 60 Minutes Intravenous  Once 10/27/17 1918 10/27/17 1920   10/27/17 0315  piperacillin-tazobactam (ZOSYN) IVPB 3.375 g     3.375 g 100 mL/hr over 30 Minutes Intravenous  Once 10/27/17 0301 10/27/17 0411   10/27/17 0315  vancomycin (VANCOCIN) IVPB 1000 mg/200 mL premix  Status:  Discontinued     1,000 mg 200 mL/hr over 60 Minutes Intravenous  Once 10/27/17 0301 10/27/17 0303   10/27/17 0315  vancomycin (VANCOCIN) IVPB 1000 mg/200 mL premix     1,000 mg 200 mL/hr over 60 Minutes Intravenous  Once 10/27/17 0305 10/27/17 0432   10/27/17 0315  vancomycin (VANCOCIN) IVPB 1000 mg/200 mL premix     1,000 mg 200 mL/hr over 60 Minutes Intravenous  Once 10/27/17 0305 10/27/17 0545        Subjective:   Lindsay Love was seen and examined today.  No complaints, awaiting surgery today.  Denies any dizziness, chest pain, shortness of breath, abdominal pain, N/V/D/C, new weakness, numbess, tingling. No acute events overnight.    Objective:   Vitals:   10/31/17 2100 11/01/17 0500 11/01/17 0524 11/01/17 0802  BP: (!) 181/88  138/66   Pulse: 100  99   Resp: 17  18   Temp: 98.6 F (37 C)  98.1 F (36.7 C)   TempSrc:  Oral  Oral   SpO2: 100%  96% 95%  Weight:  105.7 kg (233 lb 0.4 oz)    Height:        Intake/Output Summary (Last 24 hours) at 11/01/2017 1233 Last data filed at 11/01/2017 0703 Gross per 24 hour  Intake 942 ml  Output -  Net 942 ml     Wt  Readings from Last 3 Encounters:  11/01/17 105.7 kg (233 lb 0.4 oz)  08/24/17 97.5 kg (215 lb)  08/19/17 97.5 kg (215 lb)     Exam    General: Alert and oriented x 3, NAD  Eyes: ,  HEENT:    Cardiovascular: S1 S2 auscultated, no rubs, murmurs or gallops. Regular rate and rhythm. No pedal edema b/l  Respiratory: Clear to auscultation bilaterally, no wheezing, rales or rhonchi  Gastrointestinal: Soft, nontender, nondistended, + bowel sounds  Ext: no pedal edema bilaterally  Neuro: no new deficit  Musculoskeletal: No digital cyanosis, clubbing  Skin: Right foot erythema improving  Psych: Normal affect and demeanor, alert and oriented x3    Data Reviewed:  I have personally reviewed following labs and imaging studies  Micro Results Recent Results (from the past 240 hour(s))  Blood culture (routine x 2)     Status: None (Preliminary result)   Collection Time: 10/27/17  3:13 AM  Result Value Ref Range Status   Specimen Description BLOOD RIGHT ARM  Final   Special Requests   Final    BOTTLES DRAWN AEROBIC AND ANAEROBIC Blood Culture adequate volume   Culture   Final    NO GROWTH 4 DAYS Performed at [facility], [address] [place], [place] [address]    Report Status PENDING  Incomplete  Blood culture (routine x 2)     Status: None (Preliminary result)   Collection Time: 10/27/17  3:13 AM  Result Value Ref Range Status   Specimen Description BLOOD RIGHT WRIST  Final   Special Requests   Final    BOTTLES DRAWN AEROBIC AND ANAEROBIC Blood Culture adequate volume   Culture   Final    NO GROWTH 4 DAYS Performed at [facility], [address] [place], [place] [address]    Report Status PENDING   Incomplete  Urine culture     Status: Abnormal   Collection Time: 10/28/17  6:45 AM  Result Value Ref Range Status   Specimen Description URINE, CLEAN CATCH  Final   Special Requests NONE  Final   Culture (A)  Final    <10,000 COLONIES/mL INSIGNIFICANT GROWTH Performed at [facility], [address] [place], [place] [address]    Report Status 10/29/2017 FINAL  Final  Surgical pcr screen     Status: Abnormal   Collection Time: 10/31/17 11:37 PM  Result Value Ref Range Status   MRSA, PCR POSITIVE (A) NEGATIVE Final    Comment: RESULT CALLED TO, READ BACK BY AND VERIFIED WITHCollene Gobble: C SISON RN [phone] 11/01/17 A BROWNING    Staphylococcus aureus POSITIVE (A) NEGATIVE Final    Comment: (NOTE) The Xpert SA Assay (FDA approved for NASAL specimens in patients 522 years of age and older), is one component of a comprehensive surveillance program. It is not intended to diagnose infection nor to guide or monitor treatment.     Radiology Reports Dg Chest 2 View  Result Date: 10/27/2017 CLINICAL DATA:  Initial evaluation for acute cough for 2 weeks. EXAM: CHEST  2 VIEW COMPARISON:  Prior radiograph from 06/12/2017. FINDINGS: The cardiac and mediastinal silhouettes are stable in size and contour, and remain within normal limits. The lungs are normally inflated. No airspace consolidation, pleural effusion, or pulmonary edema is identified. There is no pneumothorax. No acute osseous abnormality identified. IMPRESSION: No radiographic evidence for active cardiopulmonary disease. Electronically Signed   By: Rise MuBenjamin  McClintock M.D.   On: 10/27/2017 03:31  Mr Foot Right W Wo Contrast  Result Date: 10/28/2017 CLINICAL DATA:  Diabetic patient with a nonhealing wound on the right fifth toe. Question osteomyelitis. EXAM: MRI OF THE RIGHT FOREFOOT WITHOUT AND WITH CONTRAST TECHNIQUE: Multiplanar, multisequence MR imaging of the right forefoot was performed before and after the administration of intravenous  contrast. CONTRAST:  20 ml MULTIHANCE GADOBENATE DIMEGLUMINE 529 MG/ML IV SOLN COMPARISON:  Plain films right foot 10/27/2017. MRI right foot 10/22/2017. FINDINGS: Bones/Joint/Cartilage There is intense marrow edema and enhancement throughout the fifth toe consistent with osteomyelitis. Bone marrow signal is otherwise normal. Ligaments Intact. Muscles and Tendons Intact. Mildly increased T2 signal in intrinsic musculature of the foot with minimal enhancement could be due to inflammatory change or early denervation atrophy. No intramuscular abscess. No muscle or tendon tear. Soft tissues Subcutaneous edema is present over the dorsum of the foot. No soft tissue abscess. No evidence of septic joint. IMPRESSION: Findings consistent with osteomyelitis throughout the fifth toe without abscess or septic joint. Mild edema and enhancement intrinsic musculature the foot could be due to inflammatory change or early denervation atrophy. Electronically Signed   By: Drusilla Kanner M.D.   On: 10/28/2017 09:23   Dg Foot Complete Right  Result Date: 10/27/2017 CLINICAL DATA:  Initial evaluation for acute pain. Soft tissue wounds at the right fifth toe and base of first MTP joint. EXAM: RIGHT FOOT COMPLETE - 3+ VIEW COMPARISON:  Prior radiograph from 07/02/2017. FINDINGS: No acute fracture or dislocation. Joint spaces maintained without evidence for significant degenerative or erosive arthropathy. Bone marrow signal intensity within normal limits. Soft tissue irregularity at the lateral margin of the right fifth PIP joint, suspicious for ulceration. No radiopaque foreign body. No evidence for acute osteomyelitis. No other appreciable soft tissue ulceration or abnormality identified. IMPRESSION: 1. Soft tissue ulceration at the lateral margin of the right fifth PIP joint. No evidence for associated osteomyelitis. 2. No other acute abnormality identified about the right foot. Electronically Signed   By: Rise Mu M.D.    On: 10/27/2017 03:34    Lab Data:  CBC: Recent Labs  Lab 10/27/17 0249 10/27/17 1931 10/28/17 0552 10/29/17 0559  WBC 8.0 7.8 8.0 7.3  NEUTROABS 4.8 4.6  --  4.3  HGB 12.4 10.7* 11.6* 10.8*  HCT 36.1 31.5* 33.7* 31.9*  MCV 82.4 83.6 82.2 82.9  PLT 385 312 347 349   Basic Metabolic Panel: Recent Labs  Lab 10/27/17 0249 10/27/17 1931 10/28/17 0552 10/29/17 0559  NA 126* 134* 133* 131*  K 3.8 4.2 3.9 3.4*  CL 94* 106 102 101  CO2 23 24 25 23   GLUCOSE 594* 401* 319* 305*  BUN 15 9 6 10   CREATININE 0.96 0.68 0.64 0.68  CALCIUM 8.7* 7.9* 8.3* 8.5*  MG  --  1.8  --   --   PHOS  --  2.2* 3.6  --    GFR: Estimated Creatinine Clearance: 121.6 mL/min (by C-G formula based on SCr of 0.68 mg/dL). Liver Function Tests: Recent Labs  Lab 10/27/17 0249 10/27/17 1931  AST 23 17  ALT 13* 12*  ALKPHOS 83 70  BILITOT 0.5 0.4  PROT 7.8 6.8  ALBUMIN 3.0* 2.5*   No results for input(s): LIPASE, AMYLASE in the last 168 hours. No results for input(s): AMMONIA in the last 168 hours. Coagulation Profile: Recent Labs  Lab 10/27/17 0249 10/27/17 1931 10/28/17 0552  INR 1.56 0.96 0.98   Cardiac Enzymes: No results for input(s): CKTOTAL, CKMB, CKMBINDEX, TROPONINI  in the last 168 hours. BNP (last 3 results) No results for input(s): PROBNP in the last 8760 hours. HbA1C: No results for input(s): HGBA1C in the last 72 hours. CBG: Recent Labs  Lab 10/31/17 1304 10/31/17 1647 10/31/17 2159 11/01/17 0737 11/01/17 1204  GLUCAP 148* 150* 169* 278* 200*   Lipid Profile: No results for input(s): CHOL, HDL, LDLCALC, TRIG, CHOLHDL, LDLDIRECT in the last 72 hours. Thyroid Function Tests: No results for input(s): TSH, T4TOTAL, FREET4, T3FREE, THYROIDAB in the last 72 hours. Anemia Panel: No results for input(s): VITAMINB12, FOLATE, FERRITIN, TIBC, IRON, RETICCTPCT in the last 72 hours. Urine analysis:    Component Value Date/Time   COLORURINE YELLOW 10/27/2017 0249    APPEARANCEUR CLEAR 10/27/2017 0249   LABSPEC 1.010 10/27/2017 0249   PHURINE 6.0 10/27/2017 0249   GLUCOSEU >=500 (A) 10/27/2017 0249   HGBUR MODERATE (A) 10/27/2017 0249   BILIRUBINUR NEGATIVE 10/27/2017 0249   KETONESUR NEGATIVE 10/27/2017 0249   PROTEINUR NEGATIVE 10/27/2017 0249   UROBILINOGEN 0.2 07/11/2014 1555   NITRITE NEGATIVE 10/27/2017 0249   LEUKOCYTESUR NEGATIVE 10/27/2017 0249     Virda Betters M.D. [facility] 11/01/2017, 12:33 PM  Pager: [phone] Between 7am to 7pm - call Pager - [phone]  After 7pm go to [email or web address] - password [email or web address]  Call night coverage person covering after 7pm

## 2017-11-01 NOTE — Progress Notes (Signed)
Inpatient Diabetes Program Recommendations  AACE/ADA: New Consensus Statement on Inpatient Glycemic Control (2015)  Target Ranges:  Prepandial:   less than 140 mg/dL      Peak postprandial:   less than 180 mg/dL (1-2 hours)      Critically ill patients:  140 - 180 mg/dL   Lab Results  Component Value Date   GLUCAP 200 (H) 11/01/2017   HGBA1C 13.0 (H) 10/27/2017    Review of Glycemic Control  Inpatient Diabetes Program Recommendations:    Reviewed CBGs. No recommended changes @ this time.Noted patient in OR.  Thank you, Billy FischerJudy E. Gurnoor Sloop, RN, MSN, CDE  Diabetes Coordinator Inpatient Glycemic Control Team Team Pager [phone] (8am-5pm) 11/01/2017 12:43 PM

## 2017-11-01 NOTE — Op Note (Signed)
10/27/2017 - 11/01/2017  1:57 PM  PATIENT:  Lindsay Love    PRE-OPERATIVE DIAGNOSIS:  Osteomyelitis Right Little Toe  POST-OPERATIVE DIAGNOSIS:  Same  PROCEDURE:  RIGHT FOOT 5TH RAY AMPUTATION,, local tissue rearrangement for wound closure 2 x 7 cm.  SURGEON:  Nadara MustardMarcus V Yameli Delamater, MD  PHYSICIAN ASSISTANT:None ANESTHESIA:   General  PREOPERATIVE INDICATIONS:  Lindsay Love is a  36 y.o. female with a diagnosis of Osteomyelitis Right Little Toe who failed conservative measures and elected for surgical management.    The risks benefits and alternatives were discussed with the patient preoperatively including but not limited to the risks of infection, bleeding, nerve injury, cardiopulmonary complications, the need for revision surgery, among others, and the patient was willing to proceed.  OPERATIVE IMPLANTS: none  OPERATIVE FINDINGS: No abscess good petechial bleeding  OPERATIVE PROCEDURE: Patient was brought to the operating room and underwent a general anesthetic.  After adequate levels of anesthesia were obtained patient's right lower extremity was prepped using DuraPrep draped into a sterile field a timeout was called.  A racquet incision was made around the ulcer and toe.  The fifth metatarsal was resected through midshaft and the infected toe and metatarsal were resected in one block of tissue.  Electrocautery was used for hemostasis the wound was irrigated with normal saline, local tissue rearrangement was used to close the wound 2 x 7 cm.  The incision was closed using 2-0 nylon.  A sterile compressive dressing was applied patient was extubated taken to the PACU in stable condition.   DISCHARGE PLANNING:  Antibiotic duration: Continue IV antibiotics for 24 hours postoperatively.  Weightbearing: Minimize weightbearing right lower extremity.  Pain medication: As needed.  Dressing care/ Wound VAC: Keep operative dressing dry which will be changed at follow-up in the office in 1  week.  Ambulatory devices: Walker.  Discharge to: Home, once stable with ambulation  Follow-up: In the office 1 week post operative.

## 2017-11-01 NOTE — Anesthesia Procedure Notes (Signed)
Procedure Name: LMA Insertion Date/Time: 11/01/2017 1:38 PM Performed by: Army FossaPulliam, [place] Alfredo Dane, CRNA Pre-anesthesia Checklist: Patient identified, Emergency Drugs available, Suction available and Patient being monitored Patient Re-evaluated:Patient Re-evaluated prior to induction Oxygen Delivery Method: Circle System Utilized Preoxygenation: Pre-oxygenation with 100% oxygen Induction Type: IV induction Ventilation: Mask ventilation without difficulty LMA: LMA inserted LMA Size: 4.0 Number of attempts: 1 Airway Equipment and Method: Bite block Placement Confirmation: positive ETCO2 Tube secured with: Tape Dental Injury: Teeth and Oropharynx as per pre-operative assessment

## 2017-11-01 NOTE — Interval H&P Note (Signed)
History and Physical Interval Note:  11/01/2017 1:28 PM  Lindsay Love  has presented today for surgery, with the diagnosis of Osteomyelitis Right Little Toe  The various methods of treatment have been discussed with the patient and family. After consideration of risks, benefits and other options for treatment, the patient has consented to  Procedure(s): RIGHT FOOT 5TH RAY AMPUTATION (Right) as a surgical intervention .  The patient's history has been reviewed, patient examined, no change in status, stable for surgery.  I have reviewed the patient's chart and labs.  Questions were answered to the patient's satisfaction.     Nadara MustardMarcus V Zakery Normington

## 2017-11-01 NOTE — Anesthesia Preprocedure Evaluation (Signed)
Anesthesia Evaluation  Patient identified by MRN, date of birth, ID band Patient awake    Reviewed: Allergy & Precautions, NPO status , Patient's Chart, lab work & pertinent test results  Airway Mallampati: III  TM Distance: <3 FB Neck ROM: Full    Dental  (+) Poor Dentition, Teeth Intact   Pulmonary asthma ,     + decreased breath sounds      Cardiovascular negative cardio ROS   Rhythm:Regular Rate:Normal     Neuro/Psych    GI/Hepatic GERD  ,  Endo/Other  diabetes, Poorly Controlled, Type 1, Insulin DependentMorbid obesity  Renal/GU      Musculoskeletal   Abdominal   Peds  Hematology Infection and sepsis   Anesthesia Other Findings   Reproductive/Obstetrics                             Anesthesia Physical Anesthesia Plan  ASA: III  Anesthesia Plan: General   Post-op Pain Management:    Induction: Intravenous  PONV Risk Score and Plan: 3 and Treatment may vary due to age or medical condition  Airway Management Planned: LMA  Additional Equipment:   Intra-op Plan:   Post-operative Plan:   Informed Consent: I have reviewed the patients History and Physical, chart, labs and discussed the procedure including the risks, benefits and alternatives for the proposed anesthesia with the patient or authorized representative who has indicated his/her understanding and acceptance.   Dental advisory given  Plan Discussed with: CRNA  Anesthesia Plan Comments:         Anesthesia Quick Evaluation

## 2017-11-01 NOTE — Transfer of Care (Signed)
Immediate Anesthesia Transfer of Care Note  Patient: Lindsay CamaraBrandena Love  Procedure(s) Performed: RIGHT FOOT 5TH RAY AMPUTATION (Right )  Patient Location: PACU  Anesthesia Type:General  Level of Consciousness: drowsy and patient cooperative  Airway & Oxygen Therapy: Patient Spontanous Breathing and Patient connected to face mask oxygen  Post-op Assessment: Report given to RN  Post vital signs: Reviewed and stable  Last Vitals:  Vitals:   11/01/17 0802 11/01/17 1408  BP:  103/62  Pulse:  95  Resp:  14  Temp:  36.6 C  SpO2: 95% 95%    Last Pain:  Vitals:   11/01/17 1408  TempSrc:   PainSc: Asleep      Patients Stated Pain Goal: 3 (10/27/17 2033)  Complications: No apparent anesthesia complications

## 2017-11-02 LAB — CBC
HEMATOCRIT: 36.2 % (ref 36.0–46.0)
Hemoglobin: 12.3 g/dL (ref 12.0–15.0)
MCH: 28.2 pg (ref 26.0–34.0)
MCHC: 34 g/dL (ref 30.0–36.0)
MCV: 83 fL (ref 78.0–100.0)
PLATELETS: 488 10*3/uL — AB (ref 150–400)
RBC: 4.36 MIL/uL (ref 3.87–5.11)
RDW: 13.7 % (ref 11.5–15.5)
WBC: 14.1 10*3/uL — ABNORMAL HIGH (ref 4.0–10.5)

## 2017-11-02 LAB — BASIC METABOLIC PANEL
Anion gap: 9 (ref 5–15)
BUN: 12 mg/dL (ref 6–20)
CALCIUM: 9 mg/dL (ref 8.9–10.3)
CO2: 25 mmol/L (ref 22–32)
CREATININE: 0.9 mg/dL (ref 0.44–1.00)
Chloride: 97 mmol/L — ABNORMAL LOW (ref 101–111)
Glucose, Bld: 319 mg/dL — ABNORMAL HIGH (ref 65–99)
Potassium: 4.4 mmol/L (ref 3.5–5.1)
SODIUM: 131 mmol/L — AB (ref 135–145)

## 2017-11-02 LAB — GLUCOSE, CAPILLARY
Glucose-Capillary: 278 mg/dL — ABNORMAL HIGH (ref 65–99)
Glucose-Capillary: 228 mg/dL — ABNORMAL HIGH (ref 65–99)

## 2017-11-02 MED ORDER — MOMETASONE FURO-FORMOTEROL FUM 200-5 MCG/ACT IN AERO: 2.0000 | INHALATION_SPRAY | Freq: Two times a day (BID) | RESPIRATORY_TRACT | 4 refills | Status: DC

## 2017-11-02 MED ORDER — HYDROCODONE-ACETAMINOPHEN 5-325 MG PO TABS: 1.0000 | ORAL_TABLET | Freq: Four times a day (QID) | ORAL | 0 refills | Status: DC | PRN

## 2017-11-02 MED ORDER — ACETAMINOPHEN 325 MG PO TABS: 650.0000 mg | ORAL_TABLET | ORAL | Status: DC | PRN

## 2017-11-02 MED ORDER — INSULIN ASPART 100 UNIT/ML ~~LOC~~ SOLN: 35.0000 [IU] | Freq: Three times a day (TID) | SUBCUTANEOUS | 11 refills | Status: AC

## 2017-11-02 MED ORDER — HYDROCERIN EX CREA: 1.0000 "application " | TOPICAL_CREAM | Freq: Two times a day (BID) | CUTANEOUS | 0 refills | Status: DC

## 2017-11-02 MED ORDER — METFORMIN HCL 500 MG PO TABS: 500.0000 mg | ORAL_TABLET | Freq: Two times a day (BID) | ORAL | 3 refills | Status: DC

## 2017-11-02 MED ORDER — FLUCONAZOLE 100 MG PO TABS: 100.0000 mg | ORAL_TABLET | Freq: Every day | ORAL | 0 refills | Status: DC

## 2017-11-02 MED ORDER — DOXYCYCLINE HYCLATE 100 MG PO CAPS: 100.0000 mg | ORAL_CAPSULE | Freq: Two times a day (BID) | ORAL | 0 refills | Status: DC

## 2017-11-02 MED ORDER — FLUTICASONE PROPIONATE 50 MCG/ACT NA SUSP: 2.0000 | Freq: Every day | NASAL | 2 refills | Status: DC

## 2017-11-02 MED ORDER — INSULIN DETEMIR 100 UNIT/ML ~~LOC~~ SOLN: 70.0000 [IU] | Freq: Two times a day (BID) | SUBCUTANEOUS | 11 refills | Status: DC

## 2017-11-02 MED ORDER — METHOCARBAMOL 500 MG PO TABS: 500.0000 mg | ORAL_TABLET | Freq: Three times a day (TID) | ORAL | 0 refills | Status: DC | PRN

## 2017-11-02 MED ORDER — INSULIN ASPART 100 UNIT/ML ~~LOC~~ SOLN
35.0000 [IU] | Freq: Three times a day (TID) | SUBCUTANEOUS | Status: DC
  Administered 2017-11-02: 35 [IU] via SUBCUTANEOUS

## 2017-11-02 MED ORDER — CLOTRIMAZOLE 1 % VA CREA: 1.0000 | TOPICAL_CREAM | Freq: Every day | VAGINAL | 2 refills | Status: DC

## 2017-11-02 MED ORDER — DOCUSATE SODIUM 100 MG PO CAPS: 100.0000 mg | ORAL_CAPSULE | Freq: Two times a day (BID) | ORAL | 0 refills | Status: DC

## 2017-11-02 MED ORDER — LORATADINE 10 MG PO TABS: 10.0000 mg | ORAL_TABLET | Freq: Every day | ORAL | 3 refills | Status: AC

## 2017-11-02 MED ORDER — HYDROCODONE-ACETAMINOPHEN 5-325 MG PO TABS: 1.0000 | ORAL_TABLET | ORAL | 0 refills | Status: DC | PRN

## 2017-11-02 MED ORDER — GUAIFENESIN ER 600 MG PO TB12: 600.0000 mg | ORAL_TABLET | Freq: Two times a day (BID) | ORAL | 0 refills | Status: DC | PRN

## 2017-11-02 MED ORDER — SENNA 8.6 MG PO TABS: 1.0000 | ORAL_TABLET | Freq: Two times a day (BID) | ORAL | 0 refills | Status: DC

## 2017-11-02 NOTE — Progress Notes (Signed)
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 Patient with a PMHx significant for poorly controlled diabetes mellitus with neuropathy, asthma, restless leg syndrome, gastroesophageal reflux disease, IBS.   Clinical Impression  Patient presenting with the above listed surgery. PTA, patient independent with functional mobility. Patient reports upon d/c she will be staying with her mother and children in a 1 bedroom apartment with family to help intermittently. Paitent today able to transfer to EOB with Mod I, stand pivot to [facility] with SUP and ambulate within room with RW with close SUP for general safety. [facility] as well as RW for mobility - possible knee scooter for community ambulation.    Follow Up Recommendations No PT follow up    Equipment Recommendations  Rolling walker with 5" wheels;3in1 (PT)    Recommendations for Other Services       Precautions / Restrictions Precautions Precautions: Fall Required Braces or Orthoses: Other Brace/Splint Other Brace/Splint: post-op shoe Restrictions Weight Bearing Restrictions: Yes RLE Weight Bearing: Touchdown weight bearing      Mobility  Bed Mobility Overal bed mobility: Modified Independent                Transfers Overall transfer level: Needs assistance Equipment used: Rolling walker (2 wheeled) Transfers: Sit to/from [employer];Stand Pivot Transfers Sit to Stand: Supervision Stand pivot transfers: Supervision       General transfer comment: VC to slow movements as patient is somewhat impulsive  Ambulation/Gait Ambulation/Gait assistance: Supervision Ambulation Distance (Feet): 20 Feet(within room) Assistive device: Rolling walker (2 wheeled) Gait Pattern/deviations: Step-to pattern      General Gait Details: hop to pattern with RW  Stairs            Wheelchair Mobility    Modified Rankin (Stroke Patients Only)       Balance Overall balance assessment: Needs assistance Sitting-balance support: Single extremity supported Sitting balance-Leahy Scale: Good     Standing balance support: Bilateral upper extremity supported;During functional activity Standing balance-Leahy Scale: Fair                               Pertinent Vitals/Pain Pain Assessment: 0-10 Pain Score: 4  Pain Location: R foot Pain Descriptors / Indicators: Aching;Discomfort;Grimacing;Guarding Pain Intervention(s): Limited activity within patient's tolerance;Monitored during session    Home Living Family/patient expects to be discharged to:: Private residence Living Arrangements: Children;Parent Available Help at Discharge: Available PRN/intermittently Type of Home: Apartment Home Access: (curb and threshold to enter)     Home Layout: One level Home Equipment: Crutches Additional Comments: has crutches from hamstring tear a couple months ago    Prior Function Level of Independence: Independent               Hand Dominance        Extremity/Trunk Assessment   Upper Extremity Assessment Upper Extremity Assessment: Overall WFL for tasks assessed    Lower Extremity Assessment Lower Extremity Assessment: Overall WFL for tasks assessed(Pt reporting B foot numbness - neuropathy)       Communication   Communication: No difficulties  Cognition Arousal/Alertness: Awake/alert Behavior During Therapy: WFL for tasks assessed/performed Overall Cognitive Status: Within Functional Limits for tasks assessed  General Comments      Exercises     Assessment/Plan    PT Assessment Patent does not need any further PT services  PT Problem List         PT Treatment Interventions      PT Goals (Current  goals can be found in the Care Plan section)  Acute Rehab PT Goals Patient Stated Goal: return to walking PT Goal Formulation: All assessment and education complete, DC therapy Potential to Achieve Goals: Good    Frequency     Barriers to discharge        Co-evaluation               AM-PAC PT "6 Clicks" Daily Activity  Outcome Measure Difficulty turning over in bed (including adjusting bedclothes, sheets and blankets)?: None Difficulty moving from lying on back to sitting on the side of the bed? : None Difficulty sitting down on and standing up from a chair with arms (e.g., wheelchair, bedside commode, etc,.)?: None Help needed moving to and from a bed to chair (including a wheelchair)?: A Little Help needed walking in hospital room?: A Little Help needed climbing 3-5 steps with a railing? : A Lot 6 Click Score: 20    End of Session Equipment Utilized During Treatment: Gait belt Activity Tolerance: Patient tolerated treatment well Patient left: in bed;with call bell/phone within reach Nurse Communication: Mobility status PT Visit Diagnosis: Unsteadiness on feet (R26.81);Other abnormalities of gait and mobility (R26.89);Difficulty in walking, not elsewhere classified (R26.2)    Time: [id number] PT Time Calculation (min) (ACUTE ONLY): 22 min   Charges:   PT Evaluation $PT Eval Moderate Complexity: 1 Mod     Kipp LaurenceStephanie R Adamae Ricklefs, PT, DPT 11/02/17 12:45 PM

## 2017-11-02 NOTE — Progress Notes (Signed)
Pharmacy Antibiotic Note  Lindsay Love is a 36 y.o. female admitted on 10/27/2017 with cellulitis.  Pharmacy has been consulted for vancomycin dosing.  Continues on abx for osteomyelitis of the toe and surrounding foot cellulitis, now s/p right 5th toe amputation. 12/3 right foot MRI: osteomyelitis throughout the fifth toe without abscess or septic joint. Afebrile, WBC up to 14.1.  Plan: Continue vancomycin 1,250mg  IV q12h Continue cefepime 1g IV q8h per MD Continue fluconazole 100mg  PO daily per MD - D#7, consider d/c after today's dose Monitor clinical picture, renal function, VT prn F/U C&S, abx deescalation / LOT  Height: 5\' 7"  (170.2 cm) Weight: 233 lb 0.4 oz (105.7 kg) IBW/kg (Calculated) : 61.6  Temp (24hrs), Avg:97.9 F (36.6 C), Min:97.6 F (36.4 C), Max:98.2 F (36.8 C)  Recent Labs  Lab 10/27/17 0257 10/27/17 0534 10/27/17 1931 10/28/17 0552 10/29/17 0559 11/01/17 1542 11/02/17 0419  WBC  --   --  7.8 8.0 7.3 8.7 14.1*  CREATININE  --   --  0.68 0.64 0.68 0.64 0.90  LATICACIDVEN 4.36* 1.48  --   --   --   --   --     Estimated Creatinine Clearance: 108 mL/min (by C-G formula based on SCr of 0.9 mg/dL).    Allergies  Allergen Reactions  . Morphine And Related Nausea And Vomiting    Loura BackJennifer Greenwood, PharmD, BCPS Clinical Pharmacist Phone for today [phone] Main pharmacy - [phone] 11/02/2017 10:14 AM

## 2017-11-02 NOTE — Progress Notes (Addendum)
Discharge instructions gone over with patient. Home medications discussed. Prescriptions given. Follow up appointments to be made.patient instructed to keep dressing clean dry and intact until follow up visit with surgeon. Patient verbalized understanding of instructions.

## 2017-11-02 NOTE — Discharge Summary (Addendum)
Physician Discharge Summary   Patient ID: Lindsay Love MRN: [id number] DOB/AGE: 1981/03/24 36 y.o.  Admit date: 10/27/2017 Discharge date: 11/02/2017  Primary Care Physician:  Patient, No Pcp Per  Discharge Diagnoses:    . Right foot infection . Cellulitis in diabetic foot ([place]) . IBS (irritable bowel syndrome) . Asthma . URI (upper respiratory infection) . Hyponatremia . Lactic acidosis . Diabetic peripheral neuropathy ([place]) . GERD (gastroesophageal reflux disease) . RLS (restless legs syndrome) . Vaginal candidiasis . Acute osteomyelitis of toe, right ([place]) Uncontrolled diabetes mellitus   Consults: Orthopedics, Dr. Sharol Given  Recommendations for Outpatient Follow-up:  1. Follow-up with Dr. Sharol Given in 1 week, recommended keep operative dressing dry which will be changed at follow-up in the office in 1 week 2. Please repeat CBC/BMET at next visit 3. Please check hemoglobin A1c in 1 month   DIET: Carb modified diet    Allergies:   Allergies  Allergen Reactions  . Morphine And Related Nausea And Vomiting     DISCHARGE MEDICATIONS: Allergies as of 11/02/2017      Reactions   Morphine And Related Nausea And Vomiting      Medication List    STOP taking these medications   insulin glargine 100 UNIT/ML injection Commonly known as:  LANTUS   phenazopyridine 100 MG tablet Commonly known as:  PYRIDIUM     TAKE these medications   acetaminophen 325 MG tablet Commonly known as:  TYLENOL Take 2 tablets (650 mg total) by mouth every 4 (four) hours as needed for mild pain ((score 1 to 3) or temp > 100.5).   albuterol 108 (90 Base) MCG/ACT inhaler Commonly known as:  PROVENTIL HFA;VENTOLIN HFA Inhale 2 puffs into the lungs every 6 (six) hours as needed for wheezing or shortness of breath.   blood glucose meter kit and supplies Dispense based on patient and insurance preference. Use up to four times daily as directed. (FOR ICD-9 250.00, 250.01).   clotrimazole 1 %  vaginal cream Commonly known as:  GYNE-LOTRIMIN Place 1 Applicatorful vaginally at bedtime. X 1 week   docusate sodium 100 MG capsule Commonly known as:  COLACE Take 1 capsule (100 mg total) by mouth 2 (two) times daily. For constipation   doxycycline 100 MG capsule Commonly known as:  VIBRAMYCIN Take 1 capsule (100 mg total) by mouth 2 (two) times daily. X 1 week   famotidine 20 MG tablet Commonly known as:  PEPCID Take 1 tablet (20 mg total) by mouth 2 (two) times daily.   fluconazole 100 MG tablet Commonly known as:  DIFLUCAN Take 1 tablet (100 mg total) by mouth daily. X 1 week for any future yeast infection   fluticasone 50 MCG/ACT nasal spray Commonly known as:  FLONASE Place 2 sprays into both nostrils daily.   gabapentin 300 MG capsule Commonly known as:  NEURONTIN Take 600 mg by mouth 2 (two) times daily.   guaiFENesin 600 MG 12 hr tablet Commonly known as:  MUCINEX Take 1 tablet (600 mg total) by mouth 2 (two) times daily as needed for cough or to loosen phlegm.   hydrocerin Crea Apply 1 application topically 2 (two) times daily. Apply to bilateral heels twice daily after cleansing and patting dry.   HYDROcodone-acetaminophen 5-325 MG tablet Commonly known as:  NORCO/VICODIN Take 1 tablet by mouth every 6 (six) hours as needed for moderate pain or severe pain.   ibuprofen 200 MG tablet Commonly known as:  ADVIL,MOTRIN Take 600 mg by mouth every 6 (six) hours  as needed for headache or moderate pain.   insulin aspart 100 UNIT/ML injection Commonly known as:  novoLOG Inject 35 Units into the skin 3 (three) times daily with meals.   insulin detemir 100 UNIT/ML injection Commonly known as:  LEVEMIR Inject 0.7 mLs (70 Units total) into the skin 2 (two) times daily. What changed:    how much to take  when to take this   ketotifen 0.025 % ophthalmic solution Commonly known as:  ZADITOR Place 1 drop into both eyes as needed (allergies).   loratadine 10 MG  tablet Commonly known as:  CLARITIN Take 1 tablet (10 mg total) by mouth daily.   metFORMIN 500 MG tablet Commonly known as:  GLUCOPHAGE Take 1 tablet (500 mg total) by mouth 2 (two) times daily with a meal.   methocarbamol 500 MG tablet Commonly known as:  ROBAXIN Take 1 tablet (500 mg total) by mouth every 8 (eight) hours as needed for muscle spasms.   mometasone-formoterol 200-5 MCG/ACT Aero Commonly known as:  DULERA Inhale 2 puffs into the lungs 2 (two) times daily.   senna 8.6 MG Tabs tablet Commonly known as:  SENOKOT Take 1 tablet (8.6 mg total) by mouth 2 (two) times daily.        Brief H and P: For complete details please refer to admission H and P, but in brief This 36 year old female history of poorly controlled diabetes mellitus with neuropathy, asthma, gastroesophageal reflux disease presented today ED with a 3-day history of worsening right foot swelling, pain increasing erythema and warmth. Noted to have a cellulitis. X-rays were negative for any osteomyelitis. MRI of right foot consistent with osteomyelitis of the right fifth toe. Patient on empiric IV vancomycin and IV cefepime. Orthopedics, Dr. Sharol Given consulted.   Hospital Course:  Right foot cellulitis/osteomyelitis/gangrene on the right fifth toe -In the setting of poorly controlled diabetes mellitus with neuropathy.  Hemoglobin A1c 13.0 on 12/2 -Plain films of the right foot negative for osteo, MRI of the foot consistent with osteomyelitis of the right fifth toe -Patient was placed on IV vancomycin and cefepime.  Orthopedics Dr. Sharol Given was consulted, patient underwent right fifth ray amputation on 12/7.  -She was cleared by Dr. Sharol Given for discharge home.   Poorly controlled diabetes mellitus, diabetic neuropathy -A1c 13.0 on 12/2, on high-dose Lantus 100 units daily -CBG still uncontrolled,  adjusted levemir to 70 units twice daily, increase meal coverage to 35 units NovoLog 3 times daily and metformin -   Patient was strongly counseled to be compliant with her insulin regimen, metformin for tight glycemic control for wound healing.  She was also recommended to follow-up with PCP or community wellness center for follow-up.  GERD Continue Pepcid  Hyponatremia -Pseudohyponatremia, with uncontrolled diabetes, improving  History of asthma -Stable, no wheezing.  Continue Dulera, scheduled nebs  Vaginal candidiasis -Continue clotrimazole, finished oral Diflucan -Can use Diflucan prescription for any future yeast infections     Day of Discharge BP 131/75 (BP Location: Right Arm)   Pulse 96   Temp 98.1 F (36.7 C) (Oral)   Resp 16   Ht 5' 7" (1.702 m)   Wt 105.7 kg (233 lb 0.4 oz)   LMP 10/14/2017   SpO2 95%   BMI 36.50 kg/m   Physical Exam: General: Alert and awake oriented x3 not in any acute distress. HEENT: anicteric sclera, pupils reactive to light and accommodation CVS: S1-S2 clear no murmur rubs or gallops Chest: clear to auscultation bilaterally, no wheezing rales  or rhonchi Abdomen: soft nontender, nondistended, normal bowel sounds Extremities: Right foot dressing intact Neuro: Cranial nerves II-XII intact, no focal neurological deficits   The results of significant diagnostics from this hospitalization (including imaging, microbiology, ancillary and laboratory) are listed below for reference.    LAB RESULTS: Basic Metabolic Panel: Recent Labs  Lab 10/27/17 1931 10/28/17 0552  11/01/17 1542 11/02/17 0419  NA 134* 133*   < > 135 131*  K 4.2 3.9   < > 4.4 4.4  CL 106 102   < > 104 97*  CO2 24 25   < > 23 25  GLUCOSE 401* 319*   < > 156* 319*  BUN 9 6   < > 9 12  CREATININE 0.68 0.64   < > 0.64 0.90  CALCIUM 7.9* 8.3*   < > 8.5* 9.0  MG 1.8  --   --   --   --   PHOS 2.2* 3.6  --   --   --    < > = values in this interval not displayed.   Liver Function Tests: Recent Labs  Lab 10/27/17 0249 10/27/17 1931  AST 23 17  ALT 13* 12*  ALKPHOS 83 70   BILITOT 0.5 0.4  PROT 7.8 6.8  ALBUMIN 3.0* 2.5*   No results for input(s): LIPASE, AMYLASE in the last 168 hours. No results for input(s): AMMONIA in the last 168 hours. CBC: Recent Labs  Lab 11/01/17 1542 11/02/17 0419  WBC 8.7 14.1*  NEUTROABS 6.6  --   HGB 12.0 12.3  HCT 36.1 36.2  MCV 85.5 83.0  PLT 370 488*   Cardiac Enzymes: No results for input(s): CKTOTAL, CKMB, CKMBINDEX, TROPONINI in the last 168 hours. BNP: Invalid input(s): POCBNP CBG: Recent Labs  Lab 11/01/17 2346 11/02/17 0800  GLUCAP 253* 278*    Significant Diagnostic Studies:  Dg Chest 2 View  Result Date: 10/27/2017 CLINICAL DATA:  Initial evaluation for acute cough for 2 weeks. EXAM: CHEST  2 VIEW COMPARISON:  Prior radiograph from 06/12/2017. FINDINGS: The cardiac and mediastinal silhouettes are stable in size and contour, and remain within normal limits. The lungs are normally inflated. No airspace consolidation, pleural effusion, or pulmonary edema is identified. There is no pneumothorax. No acute osseous abnormality identified. IMPRESSION: No radiographic evidence for active cardiopulmonary disease. Electronically Signed   By: Jeannine Boga M.D.   On: 10/27/2017 03:31   Mr Foot Right W Wo Contrast  Result Date: 10/28/2017 CLINICAL DATA:  Diabetic patient with a nonhealing wound on the right fifth toe. Question osteomyelitis. EXAM: MRI OF THE RIGHT FOREFOOT WITHOUT AND WITH CONTRAST TECHNIQUE: Multiplanar, multisequence MR imaging of the right forefoot was performed before and after the administration of intravenous contrast. CONTRAST:  20 ml MULTIHANCE GADOBENATE DIMEGLUMINE 529 MG/ML IV SOLN COMPARISON:  Plain films right foot 10/27/2017. MRI right foot 10/22/2017. FINDINGS: Bones/Joint/Cartilage There is intense marrow edema and enhancement throughout the fifth toe consistent with osteomyelitis. Bone marrow signal is otherwise normal. Ligaments Intact. Muscles and Tendons Intact. Mildly  increased T2 signal in intrinsic musculature of the foot with minimal enhancement could be due to inflammatory change or early denervation atrophy. No intramuscular abscess. No muscle or tendon tear. Soft tissues Subcutaneous edema is present over the dorsum of the foot. No soft tissue abscess. No evidence of septic joint. IMPRESSION: Findings consistent with osteomyelitis throughout the fifth toe without abscess or septic joint. Mild edema and enhancement intrinsic musculature the foot could be due to  inflammatory change or early denervation atrophy. Electronically Signed   By: Inge Rise M.D.   On: 10/28/2017 09:23   Dg Foot Complete Right  Result Date: 10/27/2017 CLINICAL DATA:  Initial evaluation for acute pain. Soft tissue wounds at the right fifth toe and base of first MTP joint. EXAM: RIGHT FOOT COMPLETE - 3+ VIEW COMPARISON:  Prior radiograph from 07/02/2017. FINDINGS: No acute fracture or dislocation. Joint spaces maintained without evidence for significant degenerative or erosive arthropathy. Bone marrow signal intensity within normal limits. Soft tissue irregularity at the lateral margin of the right fifth PIP joint, suspicious for ulceration. No radiopaque foreign body. No evidence for acute osteomyelitis. No other appreciable soft tissue ulceration or abnormality identified. IMPRESSION: 1. Soft tissue ulceration at the lateral margin of the right fifth PIP joint. No evidence for associated osteomyelitis. 2. No other acute abnormality identified about the right foot. Electronically Signed   By: Jeannine Boga M.D.   On: 10/27/2017 03:34    2D ECHO:   Disposition and Follow-up: Discharge Instructions    Diet Carb Modified   Complete by:  As directed    Discharge instructions   Complete by:  As directed    It is VERY IMPORTANT that you follow up with a PCP on a regular basis.  Check your blood glucoses before each meal and at bedtime and maintain a log of your readings.  Bring  this log with you when you follow up with your PCP so that he or she can adjust your insulin at your follow up visit.   Discharge instructions   Complete by:  As directed    Dressing care/ Wound VAC: Keep operative dressing dry which will be changed at follow-up in the office in 1 week.  Please stop fluconazole. You have the prescription for future yeast infection.     Increase activity slowly   Complete by:  As directed        DISPOSITION: Home   DISCHARGE FOLLOW-UP Follow-up Information    Newt Minion, MD Follow up in 1 week(s).   Specialty:  Orthopedic Surgery Contact information: [place] [address] [phone]        Alanson Aly, MD. Schedule an appointment as soon as possible for a visit in 2 week(s).   Specialty:  Internal Medicine Contact information: [address] [place] [address] [place]. Schedule an appointment as soon as possible for a visit in 2 week(s).   Why:  for hospital follow-up if unable to make appt with Dr Meredith Pel. Contact information: [address] [place] [address] [phone]           Time spent on Discharge: 52mns   Signed:   REstill CottaM.D. [facility] 11/02/2017, 11:36 AM Pager: [phone] Addendum: mod Obesity: BMI 36.49, weight 233lbs   Ripudeep Rai M.D. [facility] 11/16/2017, 2:28 PM  Pager: [phone]

## 2017-11-02 NOTE — Progress Notes (Signed)
Orthopedic Tech Progress Note Patient Details:  Hedy CamaraBrandena Cantrelle 08/15/1981 [id number]  Ortho Devices Type of Ortho Device: Postop shoe/boot Ortho Device/Splint Location: rle Ortho Device/Splint Interventions: Application   Post Interventions Patient Tolerated: Well Instructions Provided: Care of device   Nikki DomCrawford, Adra Shepler 11/02/2017, 10:10 AM

## 2017-11-04 ENCOUNTER — Encounter (HOSPITAL_COMMUNITY): Payer: Self-pay | Admitting: Orthopedic Surgery

## 2017-11-04 NOTE — Anesthesia Postprocedure Evaluation (Signed)
Anesthesia Post Note  Patient: Lindsay CamaraBrandena Love  Procedure(s) Performed: RIGHT FOOT 5TH RAY AMPUTATION (Right )     Patient location during evaluation: PACU Anesthesia Type: General Level of consciousness: awake and alert Pain management: pain level controlled Vital Signs Assessment: post-procedure vital signs reviewed and stable Respiratory status: spontaneous breathing, nonlabored ventilation, respiratory function stable and patient connected to nasal cannula oxygen Cardiovascular status: blood pressure returned to baseline and stable Postop Assessment: no apparent nausea or vomiting Anesthetic complications: no    Last Vitals:  Vitals:   11/01/17 2043 11/02/17 0535  BP: (!) 164/98 131/75  Pulse: (!) 101 96  Resp: 16 16  Temp: 36.8 C 36.7 C  SpO2: 100% 95%    Last Pain:  Vitals:   11/02/17 1424  TempSrc:   PainSc: 5                  Vanassa Penniman,JAMES TERRILL

## 2017-11-07 ENCOUNTER — Ambulatory Visit (INDEPENDENT_AMBULATORY_CARE_PROVIDER_SITE_OTHER): Payer: Medicaid Other | Admitting: Family

## 2017-11-07 ENCOUNTER — Encounter (INDEPENDENT_AMBULATORY_CARE_PROVIDER_SITE_OTHER): Payer: Self-pay | Admitting: Family

## 2017-11-07 DIAGNOSIS — Z89421 Acquired absence of other right toe(s): Secondary | ICD-10-CM

## 2017-11-07 DIAGNOSIS — L089 Local infection of the skin and subcutaneous tissue, unspecified: Secondary | ICD-10-CM

## 2017-11-07 MED ORDER — HYDROCODONE-ACETAMINOPHEN 5-325 MG PO TABS: 1.0000 | ORAL_TABLET | ORAL | 0 refills | Status: DC | PRN

## 2017-11-07 NOTE — Progress Notes (Signed)
   Post-Op Visit Note   Patient: Lindsay CamaraBrandena Love           Date of Birth: 09/24/1981           MRN: [id number] Visit Date: 11/07/2017 PCP: Patient, No Pcp Per  Chief Complaint: No chief complaint on file.   HPI:  HPI Patient is a 36 year old woman seen today 1 week status post right 5th ray amputation. Tearful, states pain medication is inadequate. Requesting order for wheel chair and shower chair to assist with her ADLs.   Ortho Exam Incision well approximated with sutures. No drainage, erythema. No sign of infection.   Visit Diagnoses:  1. Right foot infection   2. H/O amputation of lesser toe, right (HCC)     Plan: Begin daily dial soap cleansing. Apply dry dressings. Remain nonweightbearing. Given order for kneeling scooter and shower chair.  Follow-Up Instructions: Return in about 8 days (around 11/15/2017).   Imaging: No results found.  Orders:  No orders of the defined types were placed in this encounter.  No orders of the defined types were placed in this encounter.    PMFS History: Patient Active Problem List   Diagnosis Date Noted  . H/O amputation of lesser toe, right (HCC) 11/07/2017  . Acute osteomyelitis of toe, right (HCC) 10/28/2017  . Diabetic ulcer of toe of right foot associated with diabetes mellitus due to underlying condition, limited to breakdown of skin (HCC)   . Right foot infection 10/27/2017  . Cellulitis in diabetic foot (HCC) 10/27/2017  . Hyperglycemia 10/27/2017  . URI (upper respiratory infection) 10/27/2017  . Hyponatremia 10/27/2017  . Lactic acidosis 10/27/2017  . GERD (gastroesophageal reflux disease) 10/27/2017  . RLS (restless legs syndrome) 10/27/2017  . Vaginal candidiasis 10/27/2017  . Diabetes (HCC) 10/22/2016  . IBS (irritable bowel syndrome) 10/22/2016  . Asthma 10/22/2016  . Right leg pain 10/22/2016  . Cellulitis and abscess of toe of right foot   . Diabetic peripheral neuropathy (HCC)   . Failure of outpatient  treatment    Past Medical History:  Diagnosis Date  . Asthma   . Cellulitis of left foot   . Diabetes mellitus without complication (HCC)   . GERD (gastroesophageal reflux disease) 10/27/2017  . IBS (irritable bowel syndrome)   . RLS (restless legs syndrome) 10/27/2017    Family History  Problem Relation Age of Onset  . Diabetes Mother   . Cancer Mother   . Irritable bowel syndrome Mother   . Hypertension Mother   . Sleep apnea Mother   . Diabetes Father   . Restless legs syndrome Father     Past Surgical History:  Procedure Laterality Date  . AMPUTATION Right 11/01/2017   Procedure: RIGHT FOOT 5TH RAY AMPUTATION;  Surgeon: Nadara Mustarduda, Marcus V, MD;  [facility];  Service: Orthopedics;  Laterality: Right;  . CESAREAN SECTION    . CHOLECYSTECTOMY    . CYST EXCISION    . TONSILLECTOMY    . TOOTH EXTRACTION     Social History   Occupational History  . Occupation: unemployed  Tobacco Use  . Smoking status: Never Smoker  . Smokeless tobacco: Never Used  Substance and Sexual Activity  . Alcohol use: No  . Drug use: No  . Sexual activity: Not on file

## 2017-11-12 ENCOUNTER — Ambulatory Visit (INDEPENDENT_AMBULATORY_CARE_PROVIDER_SITE_OTHER): Payer: Medicaid Other | Admitting: Orthopedic Surgery

## 2017-11-14 ENCOUNTER — Ambulatory Visit (INDEPENDENT_AMBULATORY_CARE_PROVIDER_SITE_OTHER): Payer: Medicaid Other | Admitting: Family

## 2017-11-15 ENCOUNTER — Encounter (INDEPENDENT_AMBULATORY_CARE_PROVIDER_SITE_OTHER): Payer: Self-pay | Admitting: Family

## 2017-11-15 ENCOUNTER — Ambulatory Visit (INDEPENDENT_AMBULATORY_CARE_PROVIDER_SITE_OTHER): Payer: Medicaid Other | Admitting: Family

## 2017-11-15 DIAGNOSIS — M86171 Other acute osteomyelitis, right ankle and foot: Secondary | ICD-10-CM

## 2017-11-15 DIAGNOSIS — Z89421 Acquired absence of other right toe(s): Secondary | ICD-10-CM

## 2017-11-15 MED ORDER — HYDROCODONE-ACETAMINOPHEN 5-325 MG PO TABS: 1.0000 | ORAL_TABLET | Freq: Four times a day (QID) | ORAL | 0 refills | Status: DC | PRN

## 2017-11-17 ENCOUNTER — Emergency Department (HOSPITAL_BASED_OUTPATIENT_CLINIC_OR_DEPARTMENT_OTHER)
Admission: EM | Admit: 2017-11-17 | Discharge: 2017-11-17 | Disposition: A | Discharge status: Home / Self Care | Payer: Medicaid Other | Attending: Emergency Medicine | Admitting: Emergency Medicine

## 2017-11-17 ENCOUNTER — Encounter (HOSPITAL_BASED_OUTPATIENT_CLINIC_OR_DEPARTMENT_OTHER): Payer: Self-pay | Admitting: *Deleted

## 2017-11-17 ENCOUNTER — Other Ambulatory Visit: Payer: Self-pay

## 2017-11-17 ENCOUNTER — Other Ambulatory Visit (HOSPITAL_BASED_OUTPATIENT_CLINIC_OR_DEPARTMENT_OTHER): Payer: Medicaid Other

## 2017-11-17 DIAGNOSIS — M79671 Pain in right foot: Secondary | ICD-10-CM | POA: Diagnosis present

## 2017-11-17 DIAGNOSIS — J45909 Unspecified asthma, uncomplicated: Secondary | ICD-10-CM | POA: Diagnosis not present

## 2017-11-17 DIAGNOSIS — Z79899 Other long term (current) drug therapy: Secondary | ICD-10-CM | POA: Insufficient documentation

## 2017-11-17 DIAGNOSIS — Z794 Long term (current) use of insulin: Secondary | ICD-10-CM | POA: Diagnosis not present

## 2017-11-17 DIAGNOSIS — G8918 Other acute postprocedural pain: Secondary | ICD-10-CM | POA: Insufficient documentation

## 2017-11-17 DIAGNOSIS — Z89421 Acquired absence of other right toe(s): Secondary | ICD-10-CM | POA: Diagnosis not present

## 2017-11-17 DIAGNOSIS — E1140 Type 2 diabetes mellitus with diabetic neuropathy, unspecified: Secondary | ICD-10-CM | POA: Diagnosis not present

## 2017-11-17 DIAGNOSIS — M79661 Pain in right lower leg: Secondary | ICD-10-CM

## 2017-11-17 LAB — BASIC METABOLIC PANEL
Anion gap: 7 (ref 5–15)
BUN: 21 mg/dL — ABNORMAL HIGH (ref 6–20)
CHLORIDE: 102 mmol/L (ref 101–111)
CO2: 26 mmol/L (ref 22–32)
CREATININE: 0.8 mg/dL (ref 0.44–1.00)
Calcium: 9 mg/dL (ref 8.9–10.3)
GFR calc non Af Amer: >60 mL/min (ref >60)
Glucose, Bld: 220 mg/dL — ABNORMAL HIGH (ref 65–99)
POTASSIUM: 3.7 mmol/L (ref 3.5–5.1)
Sodium: 135 mmol/L (ref 135–145)

## 2017-11-17 LAB — SEDIMENTATION RATE: Sed Rate: 32 mm/hr — ABNORMAL HIGH (ref 0–22)

## 2017-11-17 LAB — CBC WITH DIFFERENTIAL/PLATELET
Basophils Absolute: 0.1 10*3/uL (ref 0.0–0.1)
Basophils Relative: 1 %
EOS ABS: 0.4 10*3/uL (ref 0.0–0.7)
Eosinophils Relative: 6 %
HEMATOCRIT: 35.5 % — AB (ref 36.0–46.0)
HEMOGLOBIN: 12.1 g/dL (ref 12.0–15.0)
LYMPHS ABS: 2.7 10*3/uL (ref 0.7–4.0)
LYMPHS PCT: 41 %
MCH: 27.7 pg (ref 26.0–34.0)
MCHC: 34.1 g/dL (ref 30.0–36.0)
MCV: 81.2 fL (ref 78.0–100.0)
MONOS PCT: 8 %
Monocytes Absolute: 0.5 10*3/uL (ref 0.1–1.0)
NEUTROS ABS: 3 10*3/uL (ref 1.7–7.7)
NEUTROS PCT: 44 %
Platelets: 369 10*3/uL (ref 150–400)
RBC: 4.37 MIL/uL (ref 3.87–5.11)
RDW: 13.5 % (ref 11.5–15.5)
WBC: 6.7 10*3/uL (ref 4.0–10.5)

## 2017-11-17 LAB — I-STAT CG4 LACTIC ACID, ED: Lactic Acid, Venous: 1.23 mmol/L (ref 0.5–1.9)

## 2017-11-17 MED ORDER — RIVAROXABAN 15 MG PO TABS
15.0000 mg | ORAL_TABLET | Freq: Once | ORAL | Status: AC
  Administered 2017-11-17: 15 mg via ORAL
  Filled 2017-11-17: qty 1

## 2017-11-17 NOTE — ED Notes (Signed)
Pt discharged to home with family. NAD.  

## 2017-11-17 NOTE — Discharge Instructions (Signed)
Continue routine wound care, as you have been doing.  Return later today for an ultrasound to make sure you do not have a blood clot in your leg. If the ultrasound shows no blood clot, then you do not need to do anything more.  Return if any signs of infection appear - like redness, swelling, fever, drainage.

## 2017-11-17 NOTE — ED Provider Notes (Signed)
[facility] EMERGENCY DEPARTMENT Provider Note   CSN: [id number] Arrival date & time: 11/17/17  0225     History   Chief Complaint Chief Complaint  Patient presents with  . right foot pain    HPI Lindsay Love is a 36 y.o. female.  The history is provided by the patient.  She has a history of diabetes complicated by peripheral neuropathy and had amputation of her right fifth toe for osteomyelitis on December 7.  2 days ago, she had her stitches removed by her orthopedic doctor.  She was concerned that the wound was separating.  Today, she has noted some increase in pain in her right foot which is worse if somebody touches around the incision.  She has not noticed any change in redness of her foot and there has been no drainage from the incision.  She denies fever or chills.  However, she is concerned about recurrent infection.  She did check her glucose at 9 PM and states it was over 300.  Past Medical History:  Diagnosis Date  . Asthma   . Cellulitis of left foot   . Diabetes mellitus without complication ([place])   . GERD (gastroesophageal reflux disease) 10/27/2017  . IBS (irritable bowel syndrome)   . RLS (restless legs syndrome) 10/27/2017    Patient Active Problem List   Diagnosis Date Noted  . H/O amputation of lesser toe, right ([place]) 11/07/2017  . Acute osteomyelitis of toe, right ([place]) 10/28/2017  . Diabetic ulcer of toe of right foot associated with diabetes mellitus due to underlying condition, limited to breakdown of skin ([place])   . Right foot infection 10/27/2017  . Cellulitis in diabetic foot ([place]) 10/27/2017  . Hyperglycemia 10/27/2017  . URI (upper respiratory infection) 10/27/2017  . Hyponatremia 10/27/2017  . Lactic acidosis 10/27/2017  . GERD (gastroesophageal reflux disease) 10/27/2017  . RLS (restless legs syndrome) 10/27/2017  . Vaginal candidiasis 10/27/2017  . Diabetes ([place]) 10/22/2016  . IBS (irritable bowel syndrome) 10/22/2016  .  Asthma 10/22/2016  . Right leg pain 10/22/2016  . Cellulitis and abscess of toe of right foot   . Diabetic peripheral neuropathy ([place])   . Failure of outpatient treatment     Past Surgical History:  Procedure Laterality Date  . AMPUTATION Right 11/01/2017   Procedure: RIGHT FOOT 5TH RAY AMPUTATION;  Surgeon: Newt Minion, MD;  Location: [facility];  Service: Orthopedics;  Laterality: Right;  . CESAREAN SECTION    . CHOLECYSTECTOMY    . CYST EXCISION    . TONSILLECTOMY    . TOOTH EXTRACTION      OB History    No data available       Home Medications    Prior to Admission medications   Medication Sig Start Date End Date Taking? Authorizing Provider  acetaminophen (TYLENOL) 325 MG tablet Take 2 tablets (650 mg total) by mouth every 4 (four) hours as needed for mild pain ((score 1 to 3) or temp > 100.5). 11/02/17   Rai, Ripudeep Raliegh Ip, MD  albuterol (PROVENTIL HFA;VENTOLIN HFA) 108 (90 Base) MCG/ACT inhaler Inhale 2 puffs into the lungs every 6 (six) hours as needed for wheezing or shortness of breath.    [provider]  blood glucose meter kit and supplies Dispense based on patient and insurance preference. Use up to four times daily as directed. (FOR ICD-9 250.00, 250.01). 10/25/16   Patrecia Pour, Christean Grief, MD  clotrimazole (GYNE-LOTRIMIN) 1 % vaginal cream Place 1 Applicatorful vaginally at bedtime.  X 1 week 11/02/17   Rai, Vernelle Emerald, MD  docusate sodium (COLACE) 100 MG capsule Take 1 capsule (100 mg total) by mouth 2 (two) times daily. For constipation 11/02/17   Rai, Vernelle Emerald, MD  doxycycline (VIBRAMYCIN) 100 MG capsule Take 1 capsule (100 mg total) by mouth 2 (two) times daily. X 1 week 11/02/17   Rai, Vernelle Emerald, MD  famotidine (PEPCID) 20 MG tablet Take 1 tablet (20 mg total) by mouth 2 (two) times daily. 10/25/16   Doreatha Lew, MD  fluconazole (DIFLUCAN) 100 MG tablet Take 1 tablet (100 mg total) by mouth daily. X 1 week for any future yeast infection 11/02/17   Rai,  Ripudeep K, MD  fluticasone (FLONASE) 50 MCG/ACT nasal spray Place 2 sprays into both nostrils daily. 11/02/17   Rai, Vernelle Emerald, MD  gabapentin (NEURONTIN) 300 MG capsule Take 600 mg by mouth 2 (two) times daily.    [provider]  guaiFENesin (MUCINEX) 600 MG 12 hr tablet Take 1 tablet (600 mg total) by mouth 2 (two) times daily as needed for cough or to loosen phlegm. 11/02/17   Rai, Vernelle Emerald, MD  hydrocerin (EUCERIN) CREA Apply 1 application topically 2 (two) times daily. Apply to bilateral heels twice daily after cleansing and patting dry. 11/02/17   Rai, Vernelle Emerald, MD  HYDROcodone-acetaminophen (NORCO/VICODIN) 5-325 MG tablet Take 1 tablet by mouth every 6 (six) hours as needed for moderate pain or severe pain. 11/15/17   Suzan Slick, NP  ibuprofen (ADVIL,MOTRIN) 200 MG tablet Take 600 mg by mouth every 6 (six) hours as needed for headache or moderate pain.    [provider]  insulin aspart (NOVOLOG) 100 UNIT/ML injection Inject 35 Units into the skin 3 (three) times daily with meals. 11/02/17   Rai, Ripudeep K, MD  insulin detemir (LEVEMIR) 100 UNIT/ML injection Inject 0.7 mLs (70 Units total) into the skin 2 (two) times daily. 11/02/17   Rai, Vernelle Emerald, MD  ketotifen (ZADITOR) 0.025 % ophthalmic solution Place 1 drop into both eyes as needed (allergies).    [provider]  loratadine (CLARITIN) 10 MG tablet Take 1 tablet (10 mg total) by mouth daily. 11/02/17   Rai, Vernelle Emerald, MD  metFORMIN (GLUCOPHAGE) 500 MG tablet Take 1 tablet (500 mg total) by mouth 2 (two) times daily with a meal. 11/02/17   Rai, Ripudeep K, MD  methocarbamol (ROBAXIN) 500 MG tablet Take 1 tablet (500 mg total) by mouth every 8 (eight) hours as needed for muscle spasms. 11/02/17   Rai, Ripudeep K, MD  mometasone-formoterol (DULERA) 200-5 MCG/ACT AERO Inhale 2 puffs into the lungs 2 (two) times daily. 11/02/17   Rai, Vernelle Emerald, MD  senna (SENOKOT) 8.6 MG TABS tablet Take 1 tablet (8.6 mg  total) by mouth 2 (two) times daily. 11/02/17   Mendel Corning, MD    Family History Family History  Problem Relation Age of Onset  . Diabetes Mother   . Cancer Mother   . Irritable bowel syndrome Mother   . Hypertension Mother   . Sleep apnea Mother   . Diabetes Father   . Restless legs syndrome Father     Social History Social History   Tobacco Use  . Smoking status: Never Smoker  . Smokeless tobacco: Never Used  Substance Use Topics  . Alcohol use: No  . Drug use: No     Allergies   Morphine and related   Review of Systems Review of Systems  All other systems reviewed and are negative.    Physical Exam Updated Vital Signs BP 137/87   Temp 98 F (36.7 C) (Oral)   Resp 18   Ht '5\' 7"'  (1.702 m)   Wt 97.5 kg (215 lb)   LMP 11/17/2017 (Exact Date)   SpO2 99%   BMI 33.67 kg/m   Physical Exam  Nursing note and vitals reviewed.  36 year old female, resting comfortably and in no acute distress. Vital signs are normal. Oxygen saturation is 99%, which is normal. Head is normocephalic and atraumatic. PERRLA, EOMI. Oropharynx is clear. Neck is nontender and supple without adenopathy or JVD. Back is nontender and there is no CVA tenderness. Lungs are clear without rales, wheezes, or rhonchi. Chest is nontender. Heart has regular rate and rhythm without murmur. Abdomen is soft, flat, nontender without masses or hepatosplenomegaly and peristalsis is normoactive. Extremities: Status post amputation of right fifth toe.  Surgical incision of the right foot appears clean without any drainage.  There is a small area with slight separation of the skin edges at the distal end of the wound, but there is no actual dehiscence.  There is mild erythema of the right foot compared with the left.  However, patient showed me a picture of her foot taken before sutures were removed and redness had been present at that time.  There is no significant swelling of the right foot.  There are  no lymphangitic streaks.  There is tenderness to palpation of the right calf but there is no difference in calf circumference on the right compared with the left. Skin is warm and dry without rash. Neurologic: Mental status is normal, cranial nerves are intact, there are no motor deficits.  Decreased sensation of both feet consistent with peripheral neuropathy with normal sensation beginning just proximal to the ankles.  ED Treatments / Results  Labs (all labs ordered are listed, but only abnormal results are displayed) Labs Reviewed  BASIC METABOLIC PANEL - Abnormal; Notable for the following components:      Result Value   Glucose, Bld 220 (*)    BUN 21 (*)    All other components within normal limits  CBC WITH DIFFERENTIAL/PLATELET - Abnormal; Notable for the following components:   HCT 35.5 (*)    All other components within normal limits  SEDIMENTATION RATE - Abnormal; Notable for the following components:   Sed Rate 32 (*)    All other components within normal limits  I-STAT CG4 LACTIC ACID, ED  I-STAT CG4 LACTIC ACID, ED    Procedures Procedures (including critical care time)  Medications Ordered in ED Medications  Rivaroxaban (XARELTO) tablet 15 mg (not administered)     Initial Impression / Assessment and Plan / ED Course  I have reviewed the triage vital signs and the nursing notes.  Pertinent labs & imaging results that were available during my care of the patient were reviewed by me and considered in my medical decision making (see chart for details).  Right foot pain status post recent amputation of right fifth toe.  No physical findings currently to suggest active infection.  Old records were reviewed confirming recent amputation.  She presented with infection of the right fifth toe and cellulitis with elevated lactic acid level.  Will screen for infection with CBC, metabolic panel, lactic acid.  No indication for imaging today.  Calf pain is certainly worrisome for  possible DVT.  If screen for sepsis is negative, will bring back for vascular ultrasound.  Laboratory workup is unremarkable.  WBC is normal with normal differential.  Glucose is only mildly elevated to 220.  Sedimentation rate is minimally elevated at 32.  She is given a dose of rivaroxaban and will be brought back for a venous ultrasound later today.  If negative, she is to resume routine postoperative wound care and follow-up with her orthopedic physician as scheduled.  Final Clinical Impressions(s) / ED Diagnoses   Final diagnoses:  Pain in right lower leg    ED Discharge Orders        Ordered    US Venous Img Lower Unilateral Right     24/15/51 [id number]       Delora Fuel, MD 32/46/99 [phone]

## 2017-11-17 NOTE — ED Triage Notes (Addendum)
Pt states she had surgery on her right foot approx Dec 1st. States she had her "pinky" toe amputated due to a "bone infection" and having diabetes.  States  She had sutures removed on Friday and states Saturday she took a shower and noticed and open area to her foot wound. C/o right foot and leg pain. Describes as a shooting type pain.  Denies any fevers.

## 2017-11-25 ENCOUNTER — Encounter (INDEPENDENT_AMBULATORY_CARE_PROVIDER_SITE_OTHER): Payer: Self-pay | Admitting: Family

## 2017-11-25 NOTE — Progress Notes (Signed)
Post-Op Visit Note   Patient: Lindsay Love           Date of Birth: 01/02/1981           MRN: [id number] Visit Date: 11/15/2017 PCP: Patient, No Pcp Per  Chief Complaint:  Chief Complaint  Patient presents with  . Right Foot - Routine Post Op    HPI:  HPI Patient is a 36 year old woman seen today status post right foot 5th ray amputation on 11/01/17.  Ortho Exam Sutures in place. Incision is well approximated and healing well. No drainage, erythema. No sign of infection  Visit Diagnoses:  1. H/O amputation of lesser toe, right (HCC)   2. Acute osteomyelitis of toe, right (HCC)     Plan: sutures harvested. May begin weight bearing in post op shoe. Continue with daily incisional care. Follow up in 2 more weeks.   Follow-Up Instructions: No Follow-up on file.   Imaging: No results found.  Orders:  No orders of the defined types were placed in this encounter.  Meds ordered this encounter  Medications  . HYDROcodone-acetaminophen (NORCO/VICODIN) 5-325 MG tablet    Sig: Take 1 tablet by mouth every 6 (six) hours as needed for moderate pain or severe pain.    Dispense:  30 tablet    Refill:  0     PMFS History: Patient Active Problem List   Diagnosis Date Noted  . H/O amputation of lesser toe, right (HCC) 11/07/2017  . Acute osteomyelitis of toe, right (HCC) 10/28/2017  . Diabetic ulcer of toe of right foot associated with diabetes mellitus due to underlying condition, limited to breakdown of skin (HCC)   . Right foot infection 10/27/2017  . Cellulitis in diabetic foot (HCC) 10/27/2017  . Hyperglycemia 10/27/2017  . URI (upper respiratory infection) 10/27/2017  . Hyponatremia 10/27/2017  . Lactic acidosis 10/27/2017  . GERD (gastroesophageal reflux disease) 10/27/2017  . RLS (restless legs syndrome) 10/27/2017  . Vaginal candidiasis 10/27/2017  . Diabetes (HCC) 10/22/2016  . IBS (irritable bowel syndrome) 10/22/2016  . Asthma 10/22/2016  . Right leg pain  10/22/2016  . Cellulitis and abscess of toe of right foot   . Diabetic peripheral neuropathy (HCC)   . Failure of outpatient treatment    Past Medical History:  Diagnosis Date  . Asthma   . Cellulitis of left foot   . Diabetes mellitus without complication (HCC)   . GERD (gastroesophageal reflux disease) 10/27/2017  . IBS (irritable bowel syndrome)   . RLS (restless legs syndrome) 10/27/2017    Family History  Problem Relation Age of Onset  . Diabetes Mother   . Cancer Mother   . Irritable bowel syndrome Mother   . Hypertension Mother   . Sleep apnea Mother   . Diabetes Father   . Restless legs syndrome Father     Past Surgical History:  Procedure Laterality Date  . AMPUTATION Right 11/01/2017   Procedure: RIGHT FOOT 5TH RAY AMPUTATION;  Surgeon: Nadara Mustarduda, Marcus V, MD;  [facility];  Service: Orthopedics;  Laterality: Right;  . CESAREAN SECTION    . CHOLECYSTECTOMY    . CYST EXCISION    . TONSILLECTOMY    . TOOTH EXTRACTION     Social History   Occupational History  . Occupation: unemployed  Tobacco Use  . Smoking status: Never Smoker  . Smokeless tobacco: Never Used  Substance and Sexual Activity  . Alcohol use: No  . Drug use: No  . Sexual activity: Not  on file

## 2017-12-02 ENCOUNTER — Ambulatory Visit (INDEPENDENT_AMBULATORY_CARE_PROVIDER_SITE_OTHER): Payer: Medicaid Other | Admitting: Orthopedic Surgery

## 2017-12-06 ENCOUNTER — Encounter (INDEPENDENT_AMBULATORY_CARE_PROVIDER_SITE_OTHER): Payer: Self-pay | Admitting: Family

## 2017-12-06 ENCOUNTER — Ambulatory Visit (INDEPENDENT_AMBULATORY_CARE_PROVIDER_SITE_OTHER): Payer: Medicaid Other

## 2017-12-06 ENCOUNTER — Ambulatory Visit (INDEPENDENT_AMBULATORY_CARE_PROVIDER_SITE_OTHER): Payer: Medicaid Other | Admitting: Family

## 2017-12-06 DIAGNOSIS — Z89421 Acquired absence of other right toe(s): Secondary | ICD-10-CM

## 2017-12-06 DIAGNOSIS — M5416 Radiculopathy, lumbar region: Secondary | ICD-10-CM

## 2017-12-06 MED ORDER — PREDNISONE 50 MG PO TABS: ORAL_TABLET | ORAL | 0 refills | Status: DC

## 2017-12-06 NOTE — Progress Notes (Signed)
Post-Op Visit Note   Patient: Lindsay Love           Date of Birth: 1981-02-10           MRN: [id number] Visit Date: 12/06/2017 PCP: Patient, No Pcp Per  Chief Complaint:  Chief Complaint  Patient presents with  . Right Foot - Follow-up    HPI:  HPI Patient is a 37 year old woman seen today status post right foot 5th ray amputation on 11/01/17.  Today complaining of RLE pain. Shooting and tingling pain down medial RLE, thigh to ankle. Chronic low back pain. Now new injury. Back pain when leaning over. 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 Pain with washing dishes and bending as well. Denies weakness or loss of bowel or bladder.   ROS: negative unless otherwise noted in HPI.  Ortho Exam  Patient is awake alert and oriented x 3. Tender to palpation to medial right thigh. No swelling to RLE. Does have positive straight leg raise on right. No spinous process tenderness. No paraspinal tenderness in low back.  Incision is well healed. No drainage, erythema. No sign of infection.  Visit Diagnoses:  1. Radiculopathy, lumbar region   2. H/O amputation of lesser toe, right (HCC)     Plan: may advance weight bearing as tolerated to RLE. Resume regular shoe wear.    Follow-Up Instructions: Return in about 4 weeks (around 01/03/2018), or if symptoms worsen or fail to improve.   Imaging: No results found.  Orders:  Orders Placed This Encounter  Procedures  . XR Lumbar Spine 2-3 Views   No orders of the defined types were placed in this encounter.    PMFS History: Patient Active Problem List   Diagnosis Date Noted  . H/O amputation of lesser toe, right (HCC) 11/07/2017  . Acute osteomyelitis of toe, right (HCC) 10/28/2017  . Diabetic ulcer of toe of right foot associated with diabetes mellitus due to underlying condition, limited to breakdown of skin (HCC)   . Right foot infection 10/27/2017  . Cellulitis in diabetic foot (HCC)  10/27/2017  . Hyperglycemia 10/27/2017  . URI (upper respiratory infection) 10/27/2017  . Hyponatremia 10/27/2017  . Lactic acidosis 10/27/2017  . GERD (gastroesophageal reflux disease) 10/27/2017  . RLS (restless legs syndrome) 10/27/2017  . Vaginal candidiasis 10/27/2017  . Diabetes (HCC) 10/22/2016  . IBS (irritable bowel syndrome) 10/22/2016  . Asthma 10/22/2016  . Right leg pain 10/22/2016  . Cellulitis and abscess of toe of right foot   . Diabetic peripheral neuropathy (HCC)   . Failure of outpatient treatment    Past Medical History:  Diagnosis Date  . Asthma   . Cellulitis of left foot   . Diabetes mellitus without complication (HCC)   . GERD (gastroesophageal reflux disease) 10/27/2017  . IBS (irritable bowel syndrome)   . RLS (restless legs syndrome) 10/27/2017    Family History  Problem Relation Age of Onset  . Diabetes Mother   . Cancer Mother   . Irritable bowel syndrome Mother   . Hypertension Mother   . Sleep apnea Mother   . Diabetes Father   . Restless legs syndrome Father     Past Surgical History:  Procedure Laterality Date  . AMPUTATION Right 11/01/2017   Procedure: RIGHT FOOT 5TH RAY AMPUTATION;  Surgeon: Nadara Mustard, MD;  Location: [facility] OR;  Service: Orthopedics;  Laterality: Right;  . CESAREAN SECTION    . CHOLECYSTECTOMY    .  CYST EXCISION    . TONSILLECTOMY    . TOOTH EXTRACTION     Social History   Occupational History  . Occupation: unemployed  Tobacco Use  . Smoking status: Never Smoker  . Smokeless tobacco: Never Used  Substance and Sexual Activity  . Alcohol use: No  . Drug use: No  . Sexual activity: Not on file

## 2017-12-31 ENCOUNTER — Emergency Department (HOSPITAL_BASED_OUTPATIENT_CLINIC_OR_DEPARTMENT_OTHER): Payer: Medicaid Other

## 2017-12-31 ENCOUNTER — Other Ambulatory Visit: Payer: Self-pay

## 2017-12-31 ENCOUNTER — Emergency Department (HOSPITAL_BASED_OUTPATIENT_CLINIC_OR_DEPARTMENT_OTHER)
Admission: EM | Admit: 2017-12-31 | Discharge: 2017-12-31 | Disposition: A | Discharge status: Home / Self Care | Payer: Medicaid Other | Attending: Emergency Medicine | Admitting: Emergency Medicine

## 2017-12-31 ENCOUNTER — Encounter (HOSPITAL_BASED_OUTPATIENT_CLINIC_OR_DEPARTMENT_OTHER): Payer: Self-pay

## 2017-12-31 DIAGNOSIS — Z79899 Other long term (current) drug therapy: Secondary | ICD-10-CM | POA: Insufficient documentation

## 2017-12-31 DIAGNOSIS — Z794 Long term (current) use of insulin: Secondary | ICD-10-CM | POA: Diagnosis not present

## 2017-12-31 DIAGNOSIS — L089 Local infection of the skin and subcutaneous tissue, unspecified: Secondary | ICD-10-CM | POA: Diagnosis not present

## 2017-12-31 DIAGNOSIS — E11628 Type 2 diabetes mellitus with other skin complications: Secondary | ICD-10-CM | POA: Diagnosis not present

## 2017-12-31 DIAGNOSIS — M79671 Pain in right foot: Secondary | ICD-10-CM | POA: Diagnosis present

## 2017-12-31 DIAGNOSIS — J45909 Unspecified asthma, uncomplicated: Secondary | ICD-10-CM | POA: Insufficient documentation

## 2017-12-31 DIAGNOSIS — E1140 Type 2 diabetes mellitus with diabetic neuropathy, unspecified: Secondary | ICD-10-CM | POA: Insufficient documentation

## 2017-12-31 DIAGNOSIS — Z89422 Acquired absence of other left toe(s): Secondary | ICD-10-CM | POA: Diagnosis not present

## 2017-12-31 LAB — BASIC METABOLIC PANEL
Anion gap: 9 (ref 5–15)
BUN: 21 mg/dL — AB (ref 6–20)
CHLORIDE: 99 mmol/L — AB (ref 101–111)
CO2: 25 mmol/L (ref 22–32)
CREATININE: 0.71 mg/dL (ref 0.44–1.00)
Calcium: 9.3 mg/dL (ref 8.9–10.3)
GFR calc Af Amer: >60 mL/min (ref >60)
GFR calc non Af Amer: >60 mL/min (ref >60)
GLUCOSE: 326 mg/dL — AB (ref 65–99)
Potassium: 4.1 mmol/L (ref 3.5–5.1)
Sodium: 133 mmol/L — ABNORMAL LOW (ref 135–145)

## 2017-12-31 LAB — CBC WITH DIFFERENTIAL/PLATELET
Basophils Absolute: 0.1 10*3/uL (ref 0.0–0.1)
Basophils Relative: 1 %
Eosinophils Absolute: 0.2 10*3/uL (ref 0.0–0.7)
Eosinophils Relative: 4 %
HEMATOCRIT: 39.1 % (ref 36.0–46.0)
HEMOGLOBIN: 13.6 g/dL (ref 12.0–15.0)
LYMPHS ABS: 2.4 10*3/uL (ref 0.7–4.0)
Lymphocytes Relative: 36 %
MCH: 28.1 pg (ref 26.0–34.0)
MCHC: 34.8 g/dL (ref 30.0–36.0)
MCV: 80.8 fL (ref 78.0–100.0)
MONO ABS: 0.4 10*3/uL (ref 0.1–1.0)
MONOS PCT: 7 %
NEUTROS ABS: 3.5 10*3/uL (ref 1.7–7.7)
NEUTROS PCT: 52 %
Platelets: 402 10*3/uL — ABNORMAL HIGH (ref 150–400)
RBC: 4.84 MIL/uL (ref 3.87–5.11)
RDW: 13.9 % (ref 11.5–15.5)
WBC: 6.7 10*3/uL (ref 4.0–10.5)

## 2017-12-31 MED ORDER — HYDROCODONE-ACETAMINOPHEN 5-325 MG PO TABS: ORAL_TABLET | ORAL | 0 refills | Status: DC

## 2017-12-31 MED ORDER — CEPHALEXIN 250 MG PO CAPS
500.0000 mg | ORAL_CAPSULE | Freq: Once | ORAL | Status: AC
  Administered 2017-12-31: 500 mg via ORAL
  Filled 2017-12-31: qty 2

## 2017-12-31 MED ORDER — HYDROCODONE-ACETAMINOPHEN 5-325 MG PO TABS
1.0000 | ORAL_TABLET | Freq: Once | ORAL | Status: AC
  Administered 2017-12-31: 1 via ORAL
  Filled 2017-12-31: qty 1

## 2017-12-31 MED ORDER — CEPHALEXIN 500 MG PO CAPS: 500.0000 mg | ORAL_CAPSULE | Freq: Four times a day (QID) | ORAL | 0 refills | Status: DC

## 2017-12-31 MED ORDER — SULFAMETHOXAZOLE-TRIMETHOPRIM 800-160 MG PO TABS: 1.0000 | ORAL_TABLET | Freq: Two times a day (BID) | ORAL | 0 refills | Status: AC

## 2017-12-31 MED ORDER — SULFAMETHOXAZOLE-TRIMETHOPRIM 800-160 MG PO TABS
1.0000 | ORAL_TABLET | Freq: Once | ORAL | Status: AC
  Administered 2017-12-31: 1 via ORAL
  Filled 2017-12-31: qty 1

## 2017-12-31 NOTE — ED Notes (Signed)
Pt c/o pain during urination.

## 2017-12-31 NOTE — Discharge Instructions (Signed)
Please read and follow all provided instructions.  Your diagnoses today include:  1. Diabetic foot infection (HCC)   2. Right foot infection     Tests performed today include:  Vital signs. See below for your results today.   X-ray - no bone infection  Blood counts - no severe infection  Medications prescribed:   Bactrim (trimethoprim/sulfamethoxazole) - antibiotic  You have been prescribed an antibiotic medicine: take the entire course of medicine even if you are feeling better. Stopping early can cause the antibiotic not to work.   Keflex (cephalexin) - antibiotic  You have been prescribed an antibiotic medicine: take the entire course of medicine even if you are feeling better. Stopping early can cause the antibiotic not to work.  Take any prescribed medications only as directed.    Vicodin (hydrocodone/acetaminophen) - narcotic pain medication  DO NOT drive or perform any activities that require you to be awake and alert because this medicine can make you drowsy. BE VERY CAREFUL not to take multiple medicines containing Tylenol (also called acetaminophen). Doing so can lead to an overdose which can damage your liver and cause liver failure and possibly death.  Home care instructions:  Follow any educational materials contained in this packet. Keep affected area above the level of your heart when possible. Wash area gently twice a day with warm soapy water. Do not apply alcohol or hydrogen peroxide. Cover the area if it draining or weeping.   Follow-up instructions: Return to the Emergency Department in 48 hours for a recheck.  Please follow-up with your primary care provider in the next 1 week for further evaluation of your symptoms.   Return instructions:  Return to the Emergency Department if you have:  Fever  Worsening symptoms  Worsening pain  Worsening swelling  Redness of the skin that moves away from the affected area, especially if it streaks away from  the affected area   Any other emergent concerns  Your vital signs today were: BP (!) 114/97 (BP Location: Right Arm)    Pulse (!) 104    Temp 98.3 F (36.8 C) (Oral)    Resp 18    Ht 5\' 7"  (1.702 m)    Wt 100.7 kg (222 lb)    LMP 12/14/2017    SpO2 100%    BMI 34.77 kg/m  If your blood pressure (BP) was elevated above 135/85 this visit, please have this repeated by your doctor within one month. --------------

## 2017-12-31 NOTE — ED Triage Notes (Signed)
C/o pain to right posterior foot x 2 days-wound to bottom of foot x today-NAD-steady gait

## 2017-12-31 NOTE — ED Notes (Signed)
ED Provider at bedside. 

## 2017-12-31 NOTE — ED Provider Notes (Signed)
[facility] EMERGENCY DEPARTMENT Provider Note   CSN: [id number] Arrival date & time: 12/31/17  1325     History   Chief Complaint Chief Complaint  Patient presents with  . Foot Pain    HPI Lindsay Love is a 37 y.o. female.  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  Patient has diabetic neuropathy and cannot feel the sole of her foot.  She looks at her feet every day.  This morning, she noted that the area is much more swollen.  She squeezed the area and drained a large amount of pus.  She denies any streaking redness, fever.  No nausea or vomiting.  With her previous infection she waited approximately 1 month to be seen.  She did not want to risk having another amputation so she presents today.  She noted worsening redness of her heel this morning.  This causes her pain she still has sensation in this area.  Onset of symptoms acute.  Course is constant.  Nothing symptoms better.  No treatments PTA.       Past Medical History:  Diagnosis Date  . Asthma   . Cellulitis of left foot   . Diabetes mellitus without complication ([place])   . GERD (gastroesophageal reflux disease) 10/27/2017  . IBS (irritable bowel syndrome)   . RLS (restless legs syndrome) 10/27/2017    Patient Active Problem List   Diagnosis Date Noted  . H/O amputation of lesser toe, right ([place]) 11/07/2017  . Acute osteomyelitis of toe, right ([place]) 10/28/2017  . Diabetic ulcer of toe of right foot associated with diabetes mellitus due to underlying condition, limited to breakdown of skin ([place])   . Right foot infection 10/27/2017  . Cellulitis in diabetic foot ([place]) 10/27/2017  . Hyperglycemia 10/27/2017  . URI (upper respiratory infection) 10/27/2017  . Hyponatremia 10/27/2017  . Lactic acidosis 10/27/2017  . GERD  (gastroesophageal reflux disease) 10/27/2017  . RLS (restless legs syndrome) 10/27/2017  . Vaginal candidiasis 10/27/2017  . Diabetes ([place]) 10/22/2016  . IBS (irritable bowel syndrome) 10/22/2016  . Asthma 10/22/2016  . Right leg pain 10/22/2016  . Cellulitis and abscess of toe of right foot   . Diabetic peripheral neuropathy ([place])   . Failure of outpatient treatment     Past Surgical History:  Procedure Laterality Date  . AMPUTATION Right 11/01/2017   Procedure: RIGHT FOOT 5TH RAY AMPUTATION;  Surgeon: Newt Minion, MD;  Location: [facility];  Service: Orthopedics;  Laterality: Right;  . CESAREAN SECTION    . CHOLECYSTECTOMY    . CYST EXCISION    . TONSILLECTOMY    . TOOTH EXTRACTION      OB History    No data available       Home Medications    Prior to Admission medications   Medication Sig Start Date End Date Taking? Authorizing Provider  acetaminophen (TYLENOL) 325 MG tablet Take 2 tablets (650 mg total) by mouth every 4 (four) hours as needed for mild pain ((score 1 to 3) or temp > 100.5). 11/02/17   Rai, Ripudeep Raliegh Ip, MD  albuterol (PROVENTIL HFA;VENTOLIN HFA) 108 (90 Base) MCG/ACT inhaler Inhale 2 puffs into the lungs every 6 (six) hours as needed for wheezing or shortness of breath.    [provider]  blood glucose meter kit and supplies Dispense  based on patient and insurance preference. Use up to four times daily as directed. (FOR ICD-9 250.00, 250.01). 10/25/16   Patrecia Pour, Christean Grief, MD  cephALEXin (KEFLEX) 500 MG capsule Take 1 capsule (500 mg total) by mouth 4 (four) times daily. 12/31/17   Carlisle Cater, PA-C  clotrimazole (GYNE-LOTRIMIN) 1 % vaginal cream Place 1 Applicatorful vaginally at bedtime. X 1 week 11/02/17   Rai, Vernelle Emerald, MD  docusate sodium (COLACE) 100 MG capsule Take 1 capsule (100 mg total) by mouth 2 (two) times daily. For constipation 11/02/17   Rai, Vernelle Emerald, MD  doxycycline (VIBRAMYCIN) 100 MG capsule Take 1 capsule (100 mg total) by mouth  2 (two) times daily. X 1 week 11/02/17   Rai, Vernelle Emerald, MD  famotidine (PEPCID) 20 MG tablet Take 1 tablet (20 mg total) by mouth 2 (two) times daily. 10/25/16   Doreatha Lew, MD  fluconazole (DIFLUCAN) 100 MG tablet Take 1 tablet (100 mg total) by mouth daily. X 1 week for any future yeast infection 11/02/17   Rai, Ripudeep K, MD  fluticasone (FLONASE) 50 MCG/ACT nasal spray Place 2 sprays into both nostrils daily. 11/02/17   Rai, Vernelle Emerald, MD  gabapentin (NEURONTIN) 300 MG capsule Take 600 mg by mouth 2 (two) times daily.    [provider]  guaiFENesin (MUCINEX) 600 MG 12 hr tablet Take 1 tablet (600 mg total) by mouth 2 (two) times daily as needed for cough or to loosen phlegm. 11/02/17   Rai, Vernelle Emerald, MD  hydrocerin (EUCERIN) CREA Apply 1 application topically 2 (two) times daily. Apply to bilateral heels twice daily after cleansing and patting dry. 11/02/17   Mendel Corning, MD  HYDROcodone-acetaminophen (NORCO/VICODIN) 5-325 MG tablet Take 1-2 tablets every 6 hours as needed for severe pain 12/31/17   Carlisle Cater, PA-C  ibuprofen (ADVIL,MOTRIN) 200 MG tablet Take 600 mg by mouth every 6 (six) hours as needed for headache or moderate pain.    [provider]  insulin aspart (NOVOLOG) 100 UNIT/ML injection Inject 35 Units into the skin 3 (three) times daily with meals. 11/02/17   Rai, Ripudeep K, MD  insulin detemir (LEVEMIR) 100 UNIT/ML injection Inject 0.7 mLs (70 Units total) into the skin 2 (two) times daily. 11/02/17   Rai, Vernelle Emerald, MD  ketotifen (ZADITOR) 0.025 % ophthalmic solution Place 1 drop into both eyes as needed (allergies).    [provider]  loratadine (CLARITIN) 10 MG tablet Take 1 tablet (10 mg total) by mouth daily. 11/02/17   Rai, Vernelle Emerald, MD  metFORMIN (GLUCOPHAGE) 500 MG tablet Take 1 tablet (500 mg total) by mouth 2 (two) times daily with a meal. 11/02/17   Rai, Ripudeep K, MD  methocarbamol (ROBAXIN) 500 MG tablet Take 1 tablet (500  mg total) by mouth every 8 (eight) hours as needed for muscle spasms. 11/02/17   Rai, Ripudeep K, MD  mometasone-formoterol (DULERA) 200-5 MCG/ACT AERO Inhale 2 puffs into the lungs 2 (two) times daily. 11/02/17   Rai, Vernelle Emerald, MD  predniSONE (DELTASONE) 50 MG tablet Take one tablet once daily x 5 days 12/06/17   Suzan Slick, NP  senna (SENOKOT) 8.6 MG TABS tablet Take 1 tablet (8.6 mg total) by mouth 2 (two) times daily. 11/02/17   Rai, Vernelle Emerald, MD  sulfamethoxazole-trimethoprim (BACTRIM DS,SEPTRA DS) 800-160 MG tablet Take 1 tablet by mouth 2 (two) times daily for 7 days. 12/31/17 01/07/18  Carlisle Cater, PA-C    Family History Family  History  Problem Relation Age of Onset  . Diabetes Mother   . Cancer Mother   . Irritable bowel syndrome Mother   . Hypertension Mother   . Sleep apnea Mother   . Diabetes Father   . Restless legs syndrome Father     Social History Social History   Tobacco Use  . Smoking status: Never Smoker  . Smokeless tobacco: Never Used  Substance Use Topics  . Alcohol use: No  . Drug use: No     Allergies   Morphine and related   Review of Systems Review of Systems  Constitutional: Negative for activity change.  Musculoskeletal: Positive for arthralgias and myalgias. Negative for back pain, joint swelling and neck pain.  Skin: Positive for color change.  Neurological: Positive for numbness (Pre-existing diabetic neuropathy). Negative for weakness.     Physical Exam Updated Vital Signs BP (!) 153/103 (BP Location: Right Arm)   Pulse 99   Temp 98.3 F (36.8 C) (Oral)   Resp 18   Ht '5\' 7"'  (1.702 m)   Wt 100.7 kg (222 lb)   LMP 12/14/2017   SpO2 99%   BMI 34.77 kg/m   Physical Exam  Constitutional: She appears well-developed and well-nourished.  HENT:  Head: Normocephalic and atraumatic.  Eyes: Conjunctivae are normal. Right eye exhibits no discharge. Left eye exhibits no discharge.  Neck: Normal range of motion. Neck supple.    Cardiovascular: Normal rate, regular rhythm and normal heart sounds.  Pulmonary/Chest: Effort normal and breath sounds normal.  Abdominal: Soft. There is no tenderness.  Musculoskeletal: She exhibits edema and tenderness.  There is approximately 3 cm induration area to the sole of the left foot at the base of the first metatarsal.  This area is firm without fluctuance.  No active drainage.  Mild overlying erythema without significant cellulitis extending from the area.  There is a mild abraded area without induration or swelling to the right heel.  Area is tender to palpation.  No significant warmth.  No drainage.  Sequelae of previous amputation noted.  Wound site appears well-healed.  Neurological: She is alert.  Skin: Skin is warm and dry.  Psychiatric: She has a normal mood and affect.  Nursing note and vitals reviewed.    ED Treatments / Results  Labs (all labs ordered are listed, but only abnormal results are displayed) Labs Reviewed  CBC WITH DIFFERENTIAL/PLATELET - Abnormal; Notable for the following components:      Result Value   Platelets 402 (*)    All other components within normal limits  BASIC METABOLIC PANEL - Abnormal; Notable for the following components:   Sodium 133 (*)    Chloride 99 (*)    Glucose, Bld 326 (*)    BUN 21 (*)    All other components within normal limits    Radiology Dg Foot Complete Right  Result Date: 12/31/2017 CLINICAL DATA:  Foot infection. There is a pus filled pocket on the plantar surface of the foot near the first metatarsal head. EXAM: RIGHT FOOT COMPLETE - 3+ VIEW COMPARISON:  None. FINDINGS: The patient's fifth toe and distal fifth metatarsal has been amputated. No acute fractures are seen. No soft tissue gas identified. No erosion in the region of the reported infection. No convincing evidence of osteomyelitis. IMPRESSION: No evidence of osteomyelitis on this study. MRI would be more sensitive if concern persists. Electronically  Signed   By: Dorise Bullion III M.D   On: 12/31/2017 19:23    Procedures  Procedures (including critical care time)  Medications Ordered in ED Medications  sulfamethoxazole-trimethoprim (BACTRIM DS,SEPTRA DS) 800-160 MG per tablet 1 tablet (1 tablet Oral Given 12/31/17 1950)  cephALEXin (KEFLEX) capsule 500 mg (500 mg Oral Given 12/31/17 1950)     Initial Impression / Assessment and Plan / ED Course  I have reviewed the triage vital signs and the nursing notes.  Pertinent labs & imaging results that were available during my care of the patient were reviewed by me and considered in my medical decision making (see chart for details).     Patient seen and examined. Work-up initiated. Medications ordered.   Vital signs reviewed and are as follows: BP (!) 153/103 (BP Location: Right Arm)   Pulse 99   Temp 98.3 F (36.8 C) (Oral)   Resp 18   Ht '5\' 7"'  (1.702 m)   Wt 100.7 kg (222 lb)   LMP 12/14/2017   SpO2 99%   BMI 34.77 kg/m   Patient updated on x-ray results.  Ultrasound used to evaluate for deeper abscess.  Very small less than 5 mm diameter area of fluid collection noted.  Cellulitis noted.  EMERGENCY DEPARTMENT US SOFT TISSUE INTERPRETATION "Study: Limited Soft Tissue Ultrasound"  INDICATIONS: Soft tissue infection Multiple views of the body part were obtained in real-time with a multi-frequency linear probe  PERFORMED BY: Myself IMAGES ARCHIVED?: Yes SIDE:Right  BODY PART:Lower extremity INTERPRETATION:  Abcess present and Cellulitis present  Given small size of fluid collection, and that the patient is a diabetic, I do not feel that she warrants incision and drainage at this time.  We will start on Bactrim and Keflex.  She will need rechecked in 2 days.  Encouraged return to the emergency department if she cannot follow-up with a primary care doctor.  Referral to [facility] given.  8:05 PM The patient was urged to return to the Emergency Department  urgently with worsening pain, swelling, expanding erythema especially if it streaks away from the affected area, fever, or if they have any other concerns.   The patient verbalized understanding and stated agreement with this plan.   Return with worsening swelling, pain, fever. Patient verbalizes understanding and agrees with plan.   Patient counseled on use of narcotic pain medications. Counseled not to combine these medications with others containing tylenol. Urged not to drink alcohol, drive, or perform any other activities that requires focus while taking these medications. The patient verbalizes understanding and agrees with the plan.   Final Clinical Impressions(s) / ED Diagnoses   Final diagnoses:  Diabetic foot infection ([place])  Right foot infection   Patient with diabetic foot infection.  X-ray does not demonstrate obvious signs of osteomyelitis and I have low suspicion for this given that her symptoms have been existing for only 1 day.  White blood cell count is normal.  She has hyperglycemia without ketosis.  Patient was started on Bactrim and Keflex here.  She will be continued on this at home.  Given mild nature of symptoms, I do not think she needs to be covered for anaerobes at this time unless her symptoms persist.  Patient will return for follow-up in 48 hours for recheck.  Will return sooner with fever, worsening pain, new symptoms.  ED Discharge Orders        Ordered    sulfamethoxazole-trimethoprim (BACTRIM DS,SEPTRA DS) 800-160 MG tablet  2 times daily     12/31/17 1955    cephALEXin (KEFLEX) 500 MG capsule  4  times daily     12/31/17 1955    HYDROcodone-acetaminophen (NORCO/VICODIN) 5-325 MG tablet     12/31/17 1959       Carlisle Cater, Hershal Coria 12/31/17 2006    Tegeler, Gwenyth Allegra, MD 01/01/18 [phone]

## 2018-07-20 ENCOUNTER — Other Ambulatory Visit: Payer: Self-pay

## 2018-07-20 ENCOUNTER — Observation Stay (HOSPITAL_BASED_OUTPATIENT_CLINIC_OR_DEPARTMENT_OTHER)
Admission: EM | Admit: 2018-07-20 | Discharge: 2018-07-21 | Disposition: A | Discharge status: Home / Self Care | Payer: Medicaid Other | Attending: Family Medicine | Admitting: Family Medicine

## 2018-07-20 ENCOUNTER — Encounter (HOSPITAL_BASED_OUTPATIENT_CLINIC_OR_DEPARTMENT_OTHER): Payer: Self-pay | Admitting: Emergency Medicine

## 2018-07-20 DIAGNOSIS — Z79899 Other long term (current) drug therapy: Secondary | ICD-10-CM | POA: Diagnosis not present

## 2018-07-20 DIAGNOSIS — N12 Tubulo-interstitial nephritis, not specified as acute or chronic: Principal | ICD-10-CM | POA: Insufficient documentation

## 2018-07-20 DIAGNOSIS — N10 Acute tubulo-interstitial nephritis: Secondary | ICD-10-CM | POA: Diagnosis not present

## 2018-07-20 DIAGNOSIS — Z794 Long term (current) use of insulin: Secondary | ICD-10-CM | POA: Insufficient documentation

## 2018-07-20 DIAGNOSIS — E119 Type 2 diabetes mellitus without complications: Secondary | ICD-10-CM

## 2018-07-20 DIAGNOSIS — E1169 Type 2 diabetes mellitus with other specified complication: Secondary | ICD-10-CM

## 2018-07-20 DIAGNOSIS — J45909 Unspecified asthma, uncomplicated: Secondary | ICD-10-CM | POA: Diagnosis not present

## 2018-07-20 DIAGNOSIS — E1165 Type 2 diabetes mellitus with hyperglycemia: Secondary | ICD-10-CM | POA: Diagnosis not present

## 2018-07-20 DIAGNOSIS — IMO0002 Reserved for concepts with insufficient information to code with codable children: Secondary | ICD-10-CM

## 2018-07-20 DIAGNOSIS — M868X7 Other osteomyelitis, ankle and foot: Secondary | ICD-10-CM | POA: Diagnosis not present

## 2018-07-20 DIAGNOSIS — K589 Irritable bowel syndrome without diarrhea: Secondary | ICD-10-CM | POA: Insufficient documentation

## 2018-07-20 DIAGNOSIS — N390 Urinary tract infection, site not specified: Secondary | ICD-10-CM | POA: Diagnosis present

## 2018-07-20 DIAGNOSIS — K219 Gastro-esophageal reflux disease without esophagitis: Secondary | ICD-10-CM | POA: Insufficient documentation

## 2018-07-20 DIAGNOSIS — A419 Sepsis, unspecified organism: Secondary | ICD-10-CM

## 2018-07-20 DIAGNOSIS — L089 Local infection of the skin and subcutaneous tissue, unspecified: Secondary | ICD-10-CM | POA: Diagnosis present

## 2018-07-20 DIAGNOSIS — Z885 Allergy status to narcotic agent status: Secondary | ICD-10-CM | POA: Diagnosis not present

## 2018-07-20 DIAGNOSIS — IMO0001 Reserved for inherently not codable concepts without codable children: Secondary | ICD-10-CM

## 2018-07-20 DIAGNOSIS — E871 Hypo-osmolality and hyponatremia: Secondary | ICD-10-CM | POA: Diagnosis present

## 2018-07-20 DIAGNOSIS — J4530 Mild persistent asthma, uncomplicated: Secondary | ICD-10-CM | POA: Diagnosis not present

## 2018-07-20 DIAGNOSIS — M869 Osteomyelitis, unspecified: Secondary | ICD-10-CM

## 2018-07-20 LAB — I-STAT CG4 LACTIC ACID, ED
LACTIC ACID, VENOUS: 2.91 mmol/L — AB (ref 0.5–1.9)
LACTIC ACID, VENOUS: 3.12 mmol/L — AB (ref 0.5–1.9)

## 2018-07-20 LAB — URINALYSIS, ROUTINE W REFLEX MICROSCOPIC
BILIRUBIN URINE: NEGATIVE
Glucose, UA: >=500 mg/dL — AB
Ketones, ur: NEGATIVE mg/dL
Leukocytes, UA: NEGATIVE
Nitrite: NEGATIVE
PH: 6 (ref 5.0–8.0)
Protein, ur: >300 mg/dL — AB
SPECIFIC GRAVITY, URINE: 1.02 (ref 1.005–1.030)

## 2018-07-20 LAB — CBC WITH DIFFERENTIAL/PLATELET
BASOS PCT: 1 %
Basophils Absolute: 0.1 10*3/uL (ref 0.0–0.1)
EOS ABS: 0.4 10*3/uL (ref 0.0–0.7)
Eosinophils Relative: 5 %
HCT: 42.7 % (ref 36.0–46.0)
Hemoglobin: 14.7 g/dL (ref 12.0–15.0)
Lymphocytes Relative: 34 %
Lymphs Abs: 2.8 10*3/uL (ref 0.7–4.0)
MCH: 27.6 pg (ref 26.0–34.0)
MCHC: 34.4 g/dL (ref 30.0–36.0)
MCV: 80.3 fL (ref 78.0–100.0)
MONO ABS: 0.6 10*3/uL (ref 0.1–1.0)
MONOS PCT: 8 %
Neutro Abs: 4.4 10*3/uL (ref 1.7–7.7)
Neutrophils Relative %: 52 %
Platelets: 327 10*3/uL (ref 150–400)
RBC: 5.32 MIL/uL — ABNORMAL HIGH (ref 3.87–5.11)
RDW: 13.9 % (ref 11.5–15.5)
WBC: 8.3 10*3/uL (ref 4.0–10.5)

## 2018-07-20 LAB — COMPREHENSIVE METABOLIC PANEL
ALBUMIN: 3.4 g/dL — AB (ref 3.5–5.0)
ALK PHOS: 76 U/L (ref 38–126)
ALT: 19 U/L (ref 0–44)
AST: 22 U/L (ref 15–41)
Anion gap: 10 (ref 5–15)
BUN: 17 mg/dL (ref 6–20)
CALCIUM: 9 mg/dL (ref 8.9–10.3)
CO2: 24 mmol/L (ref 22–32)
CREATININE: 0.84 mg/dL (ref 0.44–1.00)
Chloride: 98 mmol/L (ref 98–111)
GFR calc Af Amer: >60 mL/min (ref >60)
GFR calc non Af Amer: >60 mL/min (ref >60)
GLUCOSE: 420 mg/dL — AB (ref 70–99)
Potassium: 4.2 mmol/L (ref 3.5–5.1)
SODIUM: 132 mmol/L — AB (ref 135–145)
Total Bilirubin: 0.4 mg/dL (ref 0.3–1.2)
Total Protein: 8.1 g/dL (ref 6.5–8.1)

## 2018-07-20 LAB — URINALYSIS, MICROSCOPIC (REFLEX)

## 2018-07-20 LAB — LACTIC ACID, PLASMA: LACTIC ACID, VENOUS: 1.2 mmol/L (ref 0.5–1.9)

## 2018-07-20 LAB — PREGNANCY, URINE: Preg Test, Ur: NEGATIVE

## 2018-07-20 LAB — GLUCOSE, CAPILLARY: GLUCOSE-CAPILLARY: 333 mg/dL — AB (ref 70–99)

## 2018-07-20 MED ORDER — ACETAMINOPHEN 650 MG RE SUPP: 650.0000 mg | Freq: Four times a day (QID) | RECTAL | Status: DC | PRN

## 2018-07-20 MED ORDER — INSULIN ASPART 100 UNIT/ML ~~LOC~~ SOLN
15.0000 [IU] | Freq: Three times a day (TID) | SUBCUTANEOUS | Status: DC
  Administered 2018-07-21: 15 [IU] via SUBCUTANEOUS

## 2018-07-20 MED ORDER — ENOXAPARIN SODIUM 40 MG/0.4ML ~~LOC~~ SOLN
40.0000 mg | SUBCUTANEOUS | Status: DC
  Administered 2018-07-20: 40 mg via SUBCUTANEOUS
  Filled 2018-07-20: qty 0.4

## 2018-07-20 MED ORDER — METHOCARBAMOL 500 MG PO TABS: 500.0000 mg | ORAL_TABLET | Freq: Three times a day (TID) | ORAL | Status: DC | PRN

## 2018-07-20 MED ORDER — OXYCODONE-ACETAMINOPHEN 5-325 MG PO TABS
1.0000 | ORAL_TABLET | Freq: Once | ORAL | Status: AC
  Administered 2018-07-20: 1 via ORAL
  Filled 2018-07-20: qty 1

## 2018-07-20 MED ORDER — HYDROCODONE-ACETAMINOPHEN 5-325 MG PO TABS
1.0000 | ORAL_TABLET | ORAL | Status: DC | PRN
  Administered 2018-07-20 – 2018-07-21 (×2): 2 via ORAL
  Filled 2018-07-20 (×2): qty 2

## 2018-07-20 MED ORDER — ACETAMINOPHEN 325 MG PO TABS: 650.0000 mg | ORAL_TABLET | Freq: Four times a day (QID) | ORAL | Status: DC | PRN

## 2018-07-20 MED ORDER — MOMETASONE FURO-FORMOTEROL FUM 200-5 MCG/ACT IN AERO: 2.0000 | INHALATION_SPRAY | Freq: Two times a day (BID) | RESPIRATORY_TRACT | Status: DC

## 2018-07-20 MED ORDER — METOPROLOL TARTRATE 5 MG/5ML IV SOLN: 5.0000 mg | INTRAVENOUS | Status: DC | PRN

## 2018-07-20 MED ORDER — ENOXAPARIN SODIUM 40 MG/0.4ML ~~LOC~~ SOLN: 40.0000 mg | SUBCUTANEOUS | Status: DC

## 2018-07-20 MED ORDER — FENTANYL CITRATE (PF) 100 MCG/2ML IJ SOLN
50.0000 ug | Freq: Once | INTRAMUSCULAR | Status: AC
  Administered 2018-07-20: 50 ug via INTRAVENOUS
  Filled 2018-07-20: qty 2

## 2018-07-20 MED ORDER — ALBUTEROL SULFATE (2.5 MG/3ML) 0.083% IN NEBU: 2.5000 mg | INHALATION_SOLUTION | Freq: Four times a day (QID) | RESPIRATORY_TRACT | Status: DC | PRN

## 2018-07-20 MED ORDER — SODIUM CHLORIDE 0.9 % IV SOLN
INTRAVENOUS | Status: DC
  Administered 2018-07-20: 17:00:00 via INTRAVENOUS

## 2018-07-20 MED ORDER — SODIUM CHLORIDE 0.9 % IV SOLN
INTRAVENOUS | Status: DC | PRN
  Administered 2018-07-20: 1000 mL via INTRAVENOUS
  Administered 2018-07-21: 250 mL via INTRAVENOUS

## 2018-07-20 MED ORDER — SENNOSIDES-DOCUSATE SODIUM 8.6-50 MG PO TABS: 1.0000 | ORAL_TABLET | Freq: Every evening | ORAL | Status: DC | PRN

## 2018-07-20 MED ORDER — BISACODYL 5 MG PO TBEC: 5.0000 mg | DELAYED_RELEASE_TABLET | Freq: Every day | ORAL | Status: DC | PRN

## 2018-07-20 MED ORDER — GUAIFENESIN ER 600 MG PO TB12: 600.0000 mg | ORAL_TABLET | Freq: Two times a day (BID) | ORAL | Status: DC | PRN

## 2018-07-20 MED ORDER — SODIUM CHLORIDE 0.9% FLUSH: 3.0000 mL | Freq: Two times a day (BID) | INTRAVENOUS | Status: DC

## 2018-07-20 MED ORDER — GABAPENTIN 300 MG PO CAPS
600.0000 mg | ORAL_CAPSULE | Freq: Two times a day (BID) | ORAL | Status: DC
  Administered 2018-07-20 – 2018-07-21 (×2): 600 mg via ORAL
  Filled 2018-07-20 (×2): qty 2

## 2018-07-20 MED ORDER — INSULIN DETEMIR 100 UNIT/ML ~~LOC~~ SOLN
50.0000 [IU] | Freq: Two times a day (BID) | SUBCUTANEOUS | Status: DC
  Administered 2018-07-20 – 2018-07-21 (×2): 50 [IU] via SUBCUTANEOUS
  Filled 2018-07-20 (×3): qty 0.5

## 2018-07-20 MED ORDER — METOCLOPRAMIDE HCL 5 MG/ML IJ SOLN
10.0000 mg | Freq: Once | INTRAMUSCULAR | Status: AC
  Administered 2018-07-20: 10 mg via INTRAVENOUS
  Filled 2018-07-20: qty 2

## 2018-07-20 MED ORDER — KETOTIFEN FUMARATE 0.025 % OP SOLN
1.0000 [drp] | OPHTHALMIC | Status: DC | PRN
  Filled 2018-07-20: qty 5

## 2018-07-20 MED ORDER — ONDANSETRON HCL 4 MG PO TABS
4.0000 mg | ORAL_TABLET | Freq: Four times a day (QID) | ORAL | Status: DC | PRN
  Administered 2018-07-20: 4 mg via ORAL
  Filled 2018-07-20: qty 1

## 2018-07-20 MED ORDER — PROMETHAZINE HCL 25 MG/ML IJ SOLN: 12.5000 mg | Freq: Four times a day (QID) | INTRAMUSCULAR | Status: DC | PRN

## 2018-07-20 MED ORDER — LACTATED RINGERS IV BOLUS
1000.0000 mL | Freq: Once | INTRAVENOUS | Status: AC
  Administered 2018-07-20: 1000 mL via INTRAVENOUS

## 2018-07-20 MED ORDER — DOXYCYCLINE HYCLATE 100 MG PO TABS
100.0000 mg | ORAL_TABLET | Freq: Two times a day (BID) | ORAL | Status: DC
  Administered 2018-07-20 – 2018-07-21 (×2): 100 mg via ORAL
  Filled 2018-07-20 (×2): qty 1

## 2018-07-20 MED ORDER — ONDANSETRON HCL 4 MG/2ML IJ SOLN
4.0000 mg | Freq: Four times a day (QID) | INTRAMUSCULAR | Status: DC | PRN
  Administered 2018-07-21: 4 mg via INTRAVENOUS
  Filled 2018-07-20: qty 2

## 2018-07-20 MED ORDER — DOXYCYCLINE HYCLATE 100 MG PO CAPS: 100.0000 mg | ORAL_CAPSULE | Freq: Two times a day (BID) | ORAL | Status: DC

## 2018-07-20 MED ORDER — PHENAZOPYRIDINE HCL 100 MG PO TABS
95.0000 mg | ORAL_TABLET | Freq: Once | ORAL | Status: AC
  Administered 2018-07-20: 100 mg via ORAL
  Filled 2018-07-20: qty 1

## 2018-07-20 MED ORDER — SODIUM CHLORIDE 0.9 % IV SOLN
1.0000 g | INTRAVENOUS | Status: DC
  Administered 2018-07-21: 1 g via INTRAVENOUS
  Filled 2018-07-20: qty 1

## 2018-07-20 MED ORDER — INSULIN ASPART 100 UNIT/ML ~~LOC~~ SOLN
0.0000 [IU] | Freq: Three times a day (TID) | SUBCUTANEOUS | Status: DC
  Administered 2018-07-21 (×2): 3 [IU] via SUBCUTANEOUS

## 2018-07-20 MED ORDER — INSULIN ASPART 100 UNIT/ML ~~LOC~~ SOLN
0.0000 [IU] | Freq: Every day | SUBCUTANEOUS | Status: DC
  Administered 2018-07-20: 4 [IU] via SUBCUTANEOUS

## 2018-07-20 MED ORDER — SODIUM CHLORIDE 0.9 % IV SOLN
INTRAVENOUS | Status: AC
  Administered 2018-07-20 – 2018-07-21 (×2): via INTRAVENOUS

## 2018-07-20 MED ORDER — ALBUTEROL SULFATE HFA 108 (90 BASE) MCG/ACT IN AERS: 2.0000 | INHALATION_SPRAY | Freq: Four times a day (QID) | RESPIRATORY_TRACT | Status: DC | PRN

## 2018-07-20 MED ORDER — SODIUM CHLORIDE 0.9 % IV SOLN
1.0000 g | Freq: Once | INTRAVENOUS | Status: AC
  Administered 2018-07-20: 1 g via INTRAVENOUS
  Filled 2018-07-20: qty 10

## 2018-07-20 NOTE — ED Provider Notes (Addendum)
Emergency Department Provider Note   I have reviewed the triage vital signs and the nursing notes.   HISTORY  Chief Complaint Dysuria   HPI Lindsay Love is a 37 y.o. female multiple medical problems as documented below the presents to the emergency department today secondary to dysuria, back pain, increased urinary frequency.  Patient states she has had UTIs in the past and this feels similar to those.  She also states she had a kidney infection once and wonders if this is like that.  She has had persistent nausea after vomiting once yesterday.  No fevers that she knows of.  No abdominal pain besides suprapubic area.  No rashes.  No history of kidney stones.  No hematuria.  Compliant with medications otherwise is currently on doxycycline for postsurgical foot infection of her right foot. No other associated or modifying symptoms.    Past Medical History:  Diagnosis Date  . Asthma   . Cellulitis of left foot   . Diabetes mellitus without complication ([facility])   . GERD (gastroesophageal reflux disease) 10/27/2017  . IBS (irritable bowel syndrome)   . RLS (restless legs syndrome) 10/27/2017    Patient Active Problem List   Diagnosis Date Noted  . H/O amputation of lesser toe, right ([facility]) 11/07/2017  . Acute osteomyelitis of toe, right ([facility]) 10/28/2017  . Diabetic ulcer of toe of right foot associated with diabetes mellitus due to underlying condition, limited to breakdown of skin ([facility])   . Right foot infection 10/27/2017  . Cellulitis in diabetic foot ([facility]) 10/27/2017  . Hyperglycemia 10/27/2017  . URI (upper respiratory infection) 10/27/2017  . Hyponatremia 10/27/2017  . Lactic acidosis 10/27/2017  . GERD (gastroesophageal reflux disease) 10/27/2017  . RLS (restless legs syndrome) 10/27/2017  . Vaginal candidiasis 10/27/2017  . Diabetes ([facility]) 10/22/2016  . IBS (irritable bowel syndrome) 10/22/2016  . Asthma 10/22/2016  . Right leg pain 10/22/2016  . Cellulitis and abscess  of toe of right foot   . Diabetic peripheral neuropathy ([facility])   . Failure of outpatient treatment     Past Surgical History:  Procedure Laterality Date  . AMPUTATION Right 11/01/2017   Procedure: RIGHT FOOT 5TH RAY AMPUTATION;  Surgeon: Nadara Mustarduda, Marcus V, MD;  [facility];  Service: Orthopedics;  Laterality: Right;  . CESAREAN SECTION    . CHOLECYSTECTOMY    . CYST EXCISION    . TONSILLECTOMY    . TOOTH EXTRACTION      Current Outpatient Rx  . Order #: [id number] Class: OTC  . Order #: [id number] Class: Historical Med  . Order #: [id number] Class: Print  . Order #: [id number] Class: Print  . Order #: [id number] Class: Print  . Order #: [id number] Class: Print  . Order #: [id number] Class: Print  . Order #: [id number] Class: Print  . Order #: [id number] Class: No Print  . Order #: [id number] Class: Print  . Order #: [id number] Class: Historical Med  . Order #: [id number] Class: Print  . Order #: [id number] Class: Print  . Order #: [id number] Class: Print  . Order #: [id number] Class: Historical Med  . Order #: [id number] Class: Print  . Order #: [id number] Class: Print  . Order #: [id number] Class: Historical Med  . Order #: [id number] Class: Print  . Order #: [id number] Class: Print  . Order #: [id number] Class: Print  . Order #: [id number] Class: Print  . Order #: [id number] Class: Normal  . Order #: [id number] Class: Print    Allergies Morphine and related  Family History  Problem Relation Age of Onset  . Diabetes Mother   . Cancer Mother   .  Irritable bowel syndrome Mother   . Hypertension Mother   . Sleep apnea Mother   . Diabetes Father   . Restless legs syndrome Father     Social History Social History   Tobacco Use  . Smoking status: Never Smoker  . Smokeless tobacco: Never Used  Substance Use Topics  . Alcohol use: No  . Drug use: No    Review of Systems  All other systems negative except as documented in the HPI. All pertinent positives and negatives as reviewed in the  HPI. ____________________________________________   PHYSICAL EXAM:  VITAL SIGNS: ED Triage Vitals [07/20/18 1122]  Enc Vitals Group     BP (!) 155/105     Pulse Rate (!) 120     Resp 20     Temp 98.6 F (37 C)     Temp Source Oral     SpO2 100 %     Weight 243 lb (110.2 kg)     Height 5\' 7"  (1.702 m)    Constitutional: Alert and oriented. Well appearing and in no acute distress. Eyes: Conjunctivae are normal. PERRL. EOMI. Head: Atraumatic. Nose: No congestion/rhinnorhea. Mouth/Throat: Mucous membranes are moist.  Oropharynx non-erythematous. Neck: No stridor.  No meningeal signs.   Cardiovascular: tachycardic rate, regular rhythm. Good peripheral circulation. Grossly normal heart sounds.   Respiratory: Normal respiratory effort.  No retractions. Lungs CTAB. Gastrointestinal: Soft and nontender. No distention.  Musculoskeletal: No lower extremity tenderness nor edema. No gross deformities of extremities. Neurologic:  Normal speech and language. No gross focal neurologic deficits are appreciated.  Skin:  Skin is warm, dry and intact. No rash noted.   ____________________________________________   LABS (all labs ordered are listed, but only abnormal results are displayed)  Labs Reviewed  URINALYSIS, ROUTINE W REFLEX MICROSCOPIC - Abnormal; Notable for the following components:      Result Value   Glucose, UA >=500 (*)    Hgb urine dipstick LARGE (*)    Protein, ur >300 (*)    All other components within normal limits  COMPREHENSIVE METABOLIC PANEL - Abnormal; Notable for the following components:   Sodium 132 (*)    Glucose, Bld 420 (*)    Albumin 3.4 (*)    All other components within normal limits  CBC WITH DIFFERENTIAL/PLATELET - Abnormal; Notable for the following components:   RBC 5.32 (*)    All other components within normal limits  URINALYSIS, MICROSCOPIC (REFLEX) - Abnormal; Notable for the following components:   Bacteria, UA MANY (*)    All other  components within normal limits  I-STAT CG4 LACTIC ACID, ED - Abnormal; Notable for the following components:   Lactic Acid, Venous 2.91 (*)    All other components within normal limits  I-STAT CG4 LACTIC ACID, ED - Abnormal; Notable for the following components:   Lactic Acid, Venous 3.12 (*)    All other components within normal limits  CULTURE, BLOOD (ROUTINE X 2)  CULTURE, BLOOD (ROUTINE X 2)  URINE CULTURE  PREGNANCY, URINE   ____________________________________________  EKG  My ECG Read Indication:tachycardia EKG was personally contemporaneously reviewed by myself. Rate: 124 PR Interval: 180 QRS duration: 81 QT/QTC: 322/463 Axis: norma EKG: sinus tachycardia.  ____________________________________________  RADIOLOGY  No results found.  ____________________________________________   PROCEDURES  Procedure(s) performed:   Procedures  CRITICAL CARE Performed by: Marily Memos Total critical care time: 35 minutes Critical care time was exclusive of separately billable procedures and treating other patients. Critical care was necessary to treat  or prevent imminent or life-threatening deterioration. Critical care was time spent personally by me on the following activities: development of treatment plan with patient and/or surrogate as well as nursing, discussions with consultants, evaluation of patient's response to treatment, examination of patient, obtaining history from patient or surrogate, ordering and performing treatments and interventions, ordering and review of laboratory studies, ordering and review of radiographic studies, pulse oximetry and re-evaluation of patient's condition.  ____________________________________________   INITIAL IMPRESSION / ASSESSMENT AND PLAN / ED COURSE  Possible pyelonephritis versus just urinary tract infection with dehydration.  Will treat symptoms.    Patient with some improvement in symptoms.  Still with nausea, back pain  and suprapubic pain.  Urine is positive for infection.  Rocephin given.  2 L of fluid given lactic acid remains elevated and still remains tachycardic.  Will discuss with hospitalist regarding observation for pyelonephritis and sepsis.    Pertinent labs & imaging results that were available during my care of the patient were reviewed by me and considered in my medical decision making (see chart for details).  ____________________________________________  FINAL CLINICAL IMPRESSION(S) / ED DIAGNOSES  Final diagnoses:  Pyelonephritis  Sepsis, due to unspecified organism [facility])     MEDICATIONS GIVEN DURING THIS VISIT:  Medications  lactated ringers bolus 1,000 mL (1,000 mLs Intravenous New Bag/Given 07/20/18 1430)  0.9 %  sodium chloride infusion (1,000 mLs Intravenous New Bag/Given 07/20/18 1424)  oxyCODONE-acetaminophen (PERCOCET/ROXICET) 5-325 MG per tablet 1 tablet (has no administration in time range)  lactated ringers bolus 1,000 mL (0 mLs Intravenous Stopped 07/20/18 1316)  fentaNYL (SUBLIMAZE) injection 50 mcg (50 mcg Intravenous Given 07/20/18 1214)  cefTRIAXone (ROCEPHIN) 1 g in sodium chloride 0.9 % 100 mL IVPB (1 g Intravenous New Bag/Given 07/20/18 1429)     NEW OUTPATIENT MEDICATIONS STARTED DURING THIS VISIT:  New Prescriptions   No medications on file    Note:  This note was prepared with assistance of Dragon voice recognition software. Occasional wrong-word or sound-a-like substitutions may have occurred due to the inherent limitations of voice recognition software.   Brisha Mccabe, Barbara Cower, MD 07/20/18 1518    Adrinne Sze, Barbara Cower, MD 07/30/18 1610

## 2018-07-20 NOTE — Plan of Care (Signed)
37 year old female days, status post recent osteomyelitis was on doxycycline finished 7 days prior to admission.  [facility] with complaints of dysuria back pain and intractable nausea and vomiting.  In the ER she was found to have UA consistent with pyelonephritis.  She got 1 L of fluid and Rocephin.  When she first came into the ER her heart rate was 136 come down to 100, she was tachypneic which is resolved temperature was 98.6 blood pressure was 140 systolic.  Lactic acid level was 2.9 went up to 3.0.  I will order for fluid boluses to be given.  Patient will be admitted to telemetry.

## 2018-07-20 NOTE — ED Notes (Signed)
Attempted to call report.  ICU CN states that patient is to go to telemetry bed and not SD bed.  Notified CN that carelink was here and that no bed has been updated.

## 2018-07-20 NOTE — ED Notes (Signed)
[facility] given to [place], RN [facility]

## 2018-07-20 NOTE — ED Triage Notes (Signed)
Dysuria x 2 days. Currently taking cephalexin following foot surgery.

## 2018-07-20 NOTE — H&P (Signed)
History and Physical    Lindsay Love LFY:[id number] DOB: March 15, 1981 DOA: 07/20/2018  PCP: Debbora Lacrosse, FNP   Patient coming from: Home, by way of [facility]   Chief Complaint: Bladder pain, dysuria, back pain, N/V   HPI: Lindsay Love is a 37 y.o. female with medical history significant for insulin-dependent diabetes mellitus, asthma, and recent admission with diabetic right foot infection status post I&D with sesamoidectomy, now presenting to the emergency department for evaluation of dysuria, suprapubic pain, nausea with one episode of vomiting, and bilateral back pain worse on the left.  Patient was discharged from the hospital on 07/09/2018 to complete 3 weeks of doxycycline for the right foot infection after surgical cultures grew MRSA.  She had been doing well back at home with no fevers or drainage from the foot when she developed dysuria, suprapubic pain, back pain, and nausea 2 days ago.  Symptoms have progressively worsened but she has not experienced any subjective fevers or chills.  She denies chest pain, shortness of breath, cough, or change in bowel habits.  ED Course: Upon arrival to the ED, patient is found to be afebrile, saturating well on room air, tachycardic to 120, and with stable blood pressure.  EKG features a sinus tachycardia with rate 124.  Chemistry panel is notable for glucose of 420 and CBC is unremarkable.  Lactic acid was elevated to 2.91 and urinalysis features many bacteria with 6-10 WBC on micro.  Blood and urine cultures were collected and the patient was treated with 1 g of IV Rocephin.  Second lactate had increased to 3.12 and 2 L of lactated Ringer's were administered.  Tachycardia resolved, blood pressure remained stable, and transferred to [facility] was arranged for further evaluation and management.  Review of Systems:  All other systems reviewed and apart from HPI, are negative.  Past Medical History:  Diagnosis Date  . Asthma   .  Cellulitis of left foot   . Diabetes mellitus without complication ([place])   . GERD (gastroesophageal reflux disease) 10/27/2017  . IBS (irritable bowel syndrome)   . RLS (restless legs syndrome) 10/27/2017    Past Surgical History:  Procedure Laterality Date  . AMPUTATION Right 11/01/2017   Procedure: RIGHT FOOT 5TH RAY AMPUTATION;  Surgeon: Newt Minion, MD;  Location: [place];  Service: Orthopedics;  Laterality: Right;  . CESAREAN SECTION    . CHOLECYSTECTOMY    . CYST EXCISION    . TONSILLECTOMY    . TOOTH EXTRACTION       reports that she has never smoked. She has never used smokeless tobacco. She reports that she does not drink alcohol or use drugs.  Allergies  Allergen Reactions  . Morphine And Related Nausea And Vomiting    Family History  Problem Relation Age of Onset  . Diabetes Mother   . Cancer Mother   . Irritable bowel syndrome Mother   . Hypertension Mother   . Sleep apnea Mother   . Diabetes Father   . Restless legs syndrome Father      Prior to Admission medications   Medication Sig Start Date End Date Taking? Authorizing Provider  albuterol (PROVENTIL HFA;VENTOLIN HFA) 108 (90 Base) MCG/ACT inhaler Inhale 2 puffs into the lungs every 6 (six) hours as needed for wheezing or shortness of breath.    [provider]  blood glucose meter kit and supplies Dispense based on patient and insurance preference. Use up to four times daily as directed. (FOR ICD-9 250.00, 250.01).  10/25/16   Doreatha Lew, MD  clotrimazole (GYNE-LOTRIMIN) 1 % vaginal cream Place 1 Applicatorful vaginally at bedtime. X 1 week 11/02/17   Rai, Vernelle Emerald, MD  docusate sodium (COLACE) 100 MG capsule Take 1 capsule (100 mg total) by mouth 2 (two) times daily. For constipation 11/02/17   Rai, Vernelle Emerald, MD  doxycycline (VIBRAMYCIN) 100 MG capsule Take 1 capsule (100 mg total) by mouth 2 (two) times daily. X 1 week 11/02/17   Rai, Vernelle Emerald, MD  famotidine (PEPCID) 20 MG tablet Take  1 tablet (20 mg total) by mouth 2 (two) times daily. 10/25/16   Doreatha Lew, MD  fluconazole (DIFLUCAN) 100 MG tablet Take 1 tablet (100 mg total) by mouth daily. X 1 week for any future yeast infection 11/02/17   Rai, Ripudeep K, MD  fluticasone (FLONASE) 50 MCG/ACT nasal spray Place 2 sprays into both nostrils daily. 11/02/17   Rai, Vernelle Emerald, MD  gabapentin (NEURONTIN) 300 MG capsule Take 600 mg by mouth 2 (two) times daily.    [provider]  guaiFENesin (MUCINEX) 600 MG 12 hr tablet Take 1 tablet (600 mg total) by mouth 2 (two) times daily as needed for cough or to loosen phlegm. 11/02/17   Rai, Vernelle Emerald, MD  hydrocerin (EUCERIN) CREA Apply 1 application topically 2 (two) times daily. Apply to bilateral heels twice daily after cleansing and patting dry. 11/02/17   Rai, Vernelle Emerald, MD  ibuprofen (ADVIL,MOTRIN) 200 MG tablet Take 600 mg by mouth every 6 (six) hours as needed for headache or moderate pain.    [provider]  insulin aspart (NOVOLOG) 100 UNIT/ML injection Inject 35 Units into the skin 3 (three) times daily with meals. 11/02/17   Rai, Ripudeep K, MD  insulin detemir (LEVEMIR) 100 UNIT/ML injection Inject 0.7 mLs (70 Units total) into the skin 2 (two) times daily. 11/02/17   Rai, Vernelle Emerald, MD  ketotifen (ZADITOR) 0.025 % ophthalmic solution Place 1 drop into both eyes as needed (allergies).    [provider]  loratadine (CLARITIN) 10 MG tablet Take 1 tablet (10 mg total) by mouth daily. 11/02/17   Rai, Vernelle Emerald, MD  metFORMIN (GLUCOPHAGE) 500 MG tablet Take 1 tablet (500 mg total) by mouth 2 (two) times daily with a meal. 11/02/17   Rai, Ripudeep K, MD  methocarbamol (ROBAXIN) 500 MG tablet Take 1 tablet (500 mg total) by mouth every 8 (eight) hours as needed for muscle spasms. 11/02/17   Rai, Ripudeep K, MD  mometasone-formoterol (DULERA) 200-5 MCG/ACT AERO Inhale 2 puffs into the lungs 2 (two) times daily. 11/02/17   Rai, Vernelle Emerald, MD  senna  (SENOKOT) 8.6 MG TABS tablet Take 1 tablet (8.6 mg total) by mouth 2 (two) times daily. 11/02/17   Mendel Corning, MD    Physical Exam: Vitals:   07/20/18 1700 07/20/18 1730 07/20/18 1837 07/20/18 1900  BP: 122/75 114/76 128/80 138/85  Pulse: (!) 111 (!) 109 (!) 102 96  Resp: '15 14 20 16  ' Temp:   98.2 F (36.8 C) 97.9 F (36.6 C)  TempSrc:   Oral Oral  SpO2: 97% 95% 97% 97%  Weight:      Height:          Constitutional: NAD, calm  Eyes: PERTLA, lids and conjunctivae normal ENMT: Mucous membranes are moist. Posterior pharynx clear of any exudate or lesions.   Neck: normal, supple, no masses, no thyromegaly Respiratory: clear to auscultation bilaterally, no wheezing, no  crackles. Normal respiratory effort.    Cardiovascular: S1 & S2 heard, regular rate and rhythm. No extremity edema.  Abdomen: No distension, soft, suprapubic tenderness. Bowel sounds active.  Musculoskeletal: no clubbing / cyanosis. Surgical incision to medial aspect of plantar right forefoot with sutures, no drainage or surrounding erythema, edema, or tenderness.  Skin: no significant rashes, lesions, ulcers. Warm, dry, well-perfused. Neurologic: CN 2-12 grossly intact. . Strength 5/5 in all 4 limbs.  Psychiatric: Alert and oriented x 3. Pleasant and cooperative.     Labs on Admission: I have personally reviewed following labs and imaging studies  CBC: Recent Labs  Lab 07/20/18 1205  WBC 8.3  NEUTROABS 4.4  HGB 14.7  HCT 42.7  MCV 80.3  PLT 347   Basic Metabolic Panel: Recent Labs  Lab 07/20/18 1205  NA 132*  K 4.2  CL 98  CO2 24  GLUCOSE 420*  BUN 17  CREATININE 0.84  CALCIUM 9.0   GFR: Estimated Creatinine Clearance: 118.4 mL/min (by C-G formula based on SCr of 0.84 mg/dL). Liver Function Tests: Recent Labs  Lab 07/20/18 1205  AST 22  ALT 19  ALKPHOS 76  BILITOT 0.4  PROT 8.1  ALBUMIN 3.4*   No results for input(s): LIPASE, AMYLASE in the last 168 hours. No results for  input(s): AMMONIA in the last 168 hours. Coagulation Profile: No results for input(s): INR, PROTIME in the last 168 hours. Cardiac Enzymes: No results for input(s): CKTOTAL, CKMB, CKMBINDEX, TROPONINI in the last 168 hours. BNP (last 3 results) No results for input(s): PROBNP in the last 8760 hours. HbA1C: No results for input(s): HGBA1C in the last 72 hours. CBG: No results for input(s): GLUCAP in the last 168 hours. Lipid Profile: No results for input(s): CHOL, HDL, LDLCALC, TRIG, CHOLHDL, LDLDIRECT in the last 72 hours. Thyroid Function Tests: No results for input(s): TSH, T4TOTAL, FREET4, T3FREE, THYROIDAB in the last 72 hours. Anemia Panel: No results for input(s): VITAMINB12, FOLATE, FERRITIN, TIBC, IRON, RETICCTPCT in the last 72 hours. Urine analysis:    Component Value Date/Time   COLORURINE YELLOW 07/20/2018 [place] 07/20/2018 1132   LABSPEC 1.020 07/20/2018 1132   PHURINE 6.0 07/20/2018 1132   GLUCOSEU >=500 (A) 07/20/2018 1132   HGBUR LARGE (A) 07/20/2018 1132   BILIRUBINUR NEGATIVE 07/20/2018 1132   [place] 07/20/2018 1132   PROTEINUR >300 (A) 07/20/2018 1132   UROBILINOGEN 0.2 07/11/2014 1555   NITRITE NEGATIVE 07/20/2018 1132   LEUKOCYTESUR NEGATIVE 07/20/2018 1132   Sepsis Labs: '@LABRCNTIP' (procalcitonin:4,lacticidven:4) )No results found for this or any previous visit (from the past 240 hour(s)).   Radiological Exams on Admission: No results found.  EKG: Independently reviewed. Sinus tachycardia (rate 124).   Assessment/Plan   1. UTI  - Presents with 2 days of dysuria, suprapubic pain, and bilateral back pain worse on left  - She is afebrile, without leukocytosis, and with elevated lactic acid  - Blood and urine cultures were collected in ED and she was started on empiric Rocephin  - Continue Rocephin, follow cultures and clinical course   2. Insulin-dependent DM  - A1c ws 13.0% in 2018  - Managed at home with Levemir 70  units BID and Novolog 35 units TID  - Check CBG's, continue Levemir and Novolog, start with dose-reduction to avoid hypoglycemia and adjust as needed    3. Asthma  - No wheezing or SOB  - Continue Dulera and prn albuterol   4. Right foot osteomyelitis   -  Admitted earlier this month with diabetic right foot infection, osteomyelitis involving sesamoids noted on MRI, and she underwent I&D with sesamoidectomy on 8/11  - Appears to be healing well, sutures in place, no swelling, redness, or drainage  - Surgical cultures grew MRSA sensitive to doxycycline  - Continue doxycycline for 21 days from 07/09/18     DVT prophylaxis: Lovenox  Code Status: Full  Family Communication: Discussed with patient  Consults called: None Admission status: Observation     Vianne Bulls, MD [facility] Pager [phone]  If 7PM-7AM, please contact night-coverage [email or web address] Password [facility]  07/20/2018, 7:44 PM

## 2018-07-21 DIAGNOSIS — J4530 Mild persistent asthma, uncomplicated: Secondary | ICD-10-CM | POA: Diagnosis not present

## 2018-07-21 DIAGNOSIS — L089 Local infection of the skin and subcutaneous tissue, unspecified: Secondary | ICD-10-CM | POA: Diagnosis not present

## 2018-07-21 DIAGNOSIS — N10 Acute tubulo-interstitial nephritis: Secondary | ICD-10-CM

## 2018-07-21 DIAGNOSIS — E119 Type 2 diabetes mellitus without complications: Secondary | ICD-10-CM | POA: Diagnosis not present

## 2018-07-21 DIAGNOSIS — Z794 Long term (current) use of insulin: Secondary | ICD-10-CM

## 2018-07-21 LAB — BASIC METABOLIC PANEL
Anion gap: 7 (ref 5–15)
BUN: 17 mg/dL (ref 6–20)
CHLORIDE: 102 mmol/L (ref 98–111)
CO2: 25 mmol/L (ref 22–32)
Calcium: 8.2 mg/dL — ABNORMAL LOW (ref 8.9–10.3)
Creatinine, Ser: 0.81 mg/dL (ref 0.44–1.00)
Glucose, Bld: 226 mg/dL — ABNORMAL HIGH (ref 70–99)
POTASSIUM: 3.6 mmol/L (ref 3.5–5.1)
SODIUM: 134 mmol/L — AB (ref 135–145)

## 2018-07-21 LAB — GLUCOSE, CAPILLARY
Glucose-Capillary: 175 mg/dL — ABNORMAL HIGH (ref 70–99)
Glucose-Capillary: 196 mg/dL — ABNORMAL HIGH (ref 70–99)

## 2018-07-21 LAB — URINE CULTURE

## 2018-07-21 LAB — MRSA PCR SCREENING: MRSA by PCR: NEGATIVE

## 2018-07-21 MED ORDER — DOXYCYCLINE HYCLATE 50 MG PO CAPS: 50.0000 mg | ORAL_CAPSULE | Freq: Two times a day (BID) | ORAL | 0 refills | Status: DC

## 2018-07-21 MED ORDER — DOXYCYCLINE HYCLATE 50 MG PO CAPS: 100.0000 mg | ORAL_CAPSULE | Freq: Two times a day (BID) | ORAL | 0 refills | Status: AC

## 2018-07-21 MED ORDER — FLUCONAZOLE 100 MG PO TABS: 100.0000 mg | ORAL_TABLET | Freq: Every day | ORAL | 0 refills | Status: AC

## 2018-07-21 MED ORDER — ENOXAPARIN SODIUM 60 MG/0.6ML ~~LOC~~ SOLN: 60.0000 mg | SUBCUTANEOUS | Status: DC

## 2018-07-21 MED ORDER — METOCLOPRAMIDE HCL 5 MG/ML IJ SOLN
10.0000 mg | Freq: Once | INTRAMUSCULAR | Status: AC
  Administered 2018-07-21: 10 mg via INTRAVENOUS
  Filled 2018-07-21: qty 2

## 2018-07-21 MED ORDER — SODIUM CHLORIDE 0.9 % IV SOLN: INTRAVENOUS | Status: DC | PRN

## 2018-07-21 MED ORDER — CEFDINIR 300 MG PO CAPS: 300.0000 mg | ORAL_CAPSULE | Freq: Two times a day (BID) | ORAL | 0 refills | Status: AC

## 2018-07-21 NOTE — Progress Notes (Signed)
Reviewed discharge instructions with pt. No questions or concerns at this time. Transported via wheelchair.

## 2018-07-21 NOTE — Care Management Note (Signed)
Case Management Note  Patient Details  Name: Lindsay CamaraBrandena Love MRN: [id number] Date of Birth: 01/07/1981  Subjective/Objective:                    Action/Plan:   Expected Discharge Date:  07/21/18               Expected Discharge Plan:  Home/Self Care  In-House Referral:     Discharge planning Services  CM Consult  Post Acute Care Choice:    Choice offered to:     DME Arranged:    DME Agency:     HH Arranged:    HH Agency:     Status of Service:  Completed, signed off  If discussed at [employer] Length of Stay Meetings, dates discussed:    Additional CommentsGeni Bers:  Brooklyne Radke, RN 07/21/2018, 12:26 PM

## 2018-07-21 NOTE — Plan of Care (Signed)
  Problem: Clinical Measurements: Goal: Will remain free from infection Outcome: Progressing   Problem: Health Behavior/Discharge Planning: Goal: Ability to manage health-related needs will improve Outcome: Adequate for Discharge   Problem: Clinical Measurements: Goal: Ability to maintain clinical measurements within normal limits will improve Outcome: Adequate for Discharge Goal: Diagnostic test results will improve Outcome: Adequate for Discharge   Problem: Activity: Goal: Risk for activity intolerance will decrease Outcome: Adequate for Discharge   Problem: Nutrition: Goal: Adequate nutrition will be maintained Outcome: Adequate for Discharge   Problem: Elimination: Goal: Will not experience complications related to bowel motility Outcome: Adequate for Discharge Goal: Will not experience complications related to urinary retention Outcome: Adequate for Discharge   Problem: Pain Managment: Goal: General experience of comfort will improve Outcome: Adequate for Discharge   Problem: Safety: Goal: Ability to remain free from injury will improve Outcome: Adequate for Discharge   Problem: Skin Integrity: Goal: Risk for impaired skin integrity will decrease Outcome: Adequate for Discharge   Problem: Clinical Measurements: Goal: Respiratory complications will improve Outcome: Not Applicable Goal: Cardiovascular complication will be avoided Outcome: Not Applicable   Problem: Coping: Goal: Level of anxiety will decrease Outcome: Not Applicable

## 2018-07-21 NOTE — Discharge Summary (Signed)
Physician Discharge Summary  Lindsay Love  OYD:[id number]  DOB: 1980/11/28  DOA: 07/20/2018 PCP: Debbora Lacrosse, FNP  Admit date: 07/20/2018 Discharge date: 07/21/2018  Admitted From: Home  Disposition:  Home   Recommendations for Outpatient Follow-up:  1. Follow up with PCP in 1 week 2. Please obtain BMP to monitor renal function and electrolytes in 1-2 weeks  3. Please follow up on the following pending results: Blood and urine cultures   Discharge Condition: Stable   CODE STATUS: Full Code  Diet recommendation: Heart Healthy   Brief/Interim Summary: For full details see H&P/Progress note, but in brief, Lindsay Love is a 37 year old female with medical history significant for insulin-dependent diabetes mellitus, asthma, who is being treated for diabetes right foot infection status post I&D with sesamoidectomy who presented to the emergency department complaining of bladder pain, dysuria and flank pain associated with chills, nausea and vomiting.  Upon ED evaluation patient was found to be afebrile, tachycardic, lactic acid elevated on urinalysis and concerning for UTI.  Patient was admitted with working diagnosis of UTI/pyelonephritis.  Subjective: Patient seen and examined, she report significant improvement on symptoms.  Denies back pain, nausea and vomiting.  Still some dysuria but improved.  Suprapubic pain mild at this point.  Afebrile.  Tolerating diet well.  No acute events overnight  Discharge Diagnoses/Hospital Course:  UTI/pyelonephritis UA concerning of infection, clinically with dysuria, suprapubic pain flank pain.  Patient was on Doxy prior to admission which normally not have good coverage for UTIs.  Will treat clinically as patient has improved with antibiotic therapy.  Do not expect urine culture to be positive.  Will continue cephalosporin for 5 days.  Since patient is tolerating oral diet, will treat with p.o. antibiotics and follow-up with as an  outpatient.  Diabetes mellitus type 2 uncontrolled with hyperglycemia A1c 13.0 on December 2018 Continue home medications and follow-up with PCP.  Recommend to repeat A1c  Asthma Stable no signs of wheezing or shortness of breath Continue home inhalers and albuterol as needed  Right foot osteomyelitis Patient is status post I&D with sesamoidectomy on 8/11 Surgical cultures grew MRSA sens to doxy  Patient currently on doxycycline for 21-day and date 07/31/18  All other chronic medical condition were stable during the hospitalization.  On the day of the discharge the patient's vitals were stable, and no other acute medical condition were reported by patient. the patient was felt safe to be discharge to home.   Discharge Instructions  You were cared for by a hospitalist during your hospital stay. If you have any questions about your discharge medications or the care you received while you were in the hospital after you are discharged, you can call the unit and asked to speak with the hospitalist on call if the hospitalist that took care of you is not available. Once you are discharged, your primary care physician will handle any further medical issues. Please note that NO REFILLS for any discharge medications will be authorized once you are discharged, as it is imperative that you return to your primary care physician (or establish a relationship with a primary care physician if you do not have one) for your aftercare needs so that they can reassess your need for medications and monitor your lab values.  Discharge Instructions    Call MD for:  difficulty breathing, headache or visual disturbances   Complete by:  As directed    Call MD for:  extreme fatigue   Complete by:  As directed  Call MD for:  hives   Complete by:  As directed    Call MD for:  persistant dizziness or light-headedness   Complete by:  As directed    Call MD for:  persistant nausea and vomiting   Complete by:  As  directed    Call MD for:  redness, tenderness, or signs of infection (pain, swelling, redness, odor or green/yellow discharge around incision site)   Complete by:  As directed    Call MD for:  severe uncontrolled pain   Complete by:  As directed    Call MD for:  temperature >100.4   Complete by:  As directed    Diet Carb Modified   Complete by:  As directed    Increase activity slowly   Complete by:  As directed      Allergies as of 07/21/2018      Reactions   Morphine And Related Nausea And Vomiting      Medication List    STOP taking these medications   acetaminophen 325 MG tablet Commonly known as:  TYLENOL   cephALEXin 500 MG capsule Commonly known as:  KEFLEX   fluticasone 50 MCG/ACT nasal spray Commonly known as:  FLONASE   guaiFENesin 600 MG 12 hr tablet Commonly known as:  MUCINEX   hydrocerin Crea   HYDROcodone-acetaminophen 5-325 MG tablet Commonly known as:  NORCO/VICODIN   insulin detemir 100 UNIT/ML injection Commonly known as:  LEVEMIR   methocarbamol 500 MG tablet Commonly known as:  ROBAXIN   predniSONE 50 MG tablet Commonly known as:  DELTASONE   senna 8.6 MG Tabs tablet Commonly known as:  SENOKOT     TAKE these medications   albuterol 108 (90 Base) MCG/ACT inhaler Commonly known as:  PROVENTIL HFA;VENTOLIN HFA Inhale 2 puffs into the lungs every 6 (six) hours as needed for wheezing or shortness of breath.   blood glucose meter kit and supplies Dispense based on patient and insurance preference. Use up to four times daily as directed. (FOR ICD-9 250.00, 250.01).   cefdinir 300 MG capsule Commonly known as:  OMNICEF Take 1 capsule (300 mg total) by mouth 2 (two) times daily for 5 days.   docusate sodium 100 MG capsule Commonly known as:  COLACE Take 1 capsule (100 mg total) by mouth 2 (two) times daily. For constipation What changed:    when to take this  reasons to take this   doxycycline 50 MG capsule Commonly known as:   VIBRAMYCIN Take 1 capsule (50 mg total) by mouth 2 (two) times daily for 10 days. What changed:  Another medication with the same name was removed. Continue taking this medication, and follow the directions you see here.   famotidine 20 MG tablet Commonly known as:  PEPCID Take 1 tablet (20 mg total) by mouth 2 (two) times daily.   fluconazole 100 MG tablet Commonly known as:  DIFLUCAN Take 1 tablet (100 mg total) by mouth daily for 5 days. X 1 week for any future yeast infection   gabapentin 300 MG capsule Commonly known as:  NEURONTIN Take 600 mg by mouth 2 (two) times daily.   ibuprofen 200 MG tablet Commonly known as:  ADVIL,MOTRIN Take 600 mg by mouth every 6 (six) hours as needed for headache or moderate pain.   insulin aspart 100 UNIT/ML injection Commonly known as:  novoLOG Inject 35 Units into the skin 3 (three) times daily with meals. What changed:    how much to take  when to take this  additional instructions   lisinopril 10 MG tablet Commonly known as:  PRINIVIL,ZESTRIL Take 10 mg by mouth daily.   loratadine 10 MG tablet Commonly known as:  CLARITIN Take 1 tablet (10 mg total) by mouth daily.   metFORMIN 500 MG tablet Commonly known as:  GLUCOPHAGE Take 1 tablet (500 mg total) by mouth 2 (two) times daily with a meal.   mometasone-formoterol 200-5 MCG/ACT Aero Commonly known as:  DULERA Inhale 2 puffs into the lungs 2 (two) times daily.   TRESIBA FLEXTOUCH 200 UNIT/ML Sopn Generic drug:  Insulin Degludec Inject 100 Units into the skin every morning.      Follow-up Information    Kathrynn Humble Brennan Bailey, FNP. Schedule an appointment as soon as possible for a visit in 1 week(s).   Specialty:  Internal Medicine Why:  Hospital follow up  Contact information: [address] [place] [address] [phone]          Allergies  Allergen Reactions  . Morphine And Related Nausea And Vomiting     Consultations:     Procedures/Studies: No results found.   Discharge Exam: Vitals:   07/20/18 2027 07/21/18 0431  BP: (!) 147/99 (!) 151/83  Pulse: (!) 108 (!) 115  Resp: 12 16  Temp: 98.2 F (36.8 C) 98.5 F (36.9 C)  SpO2: 98% 99%   Vitals:   07/20/18 1837 07/20/18 1900 07/20/18 2027 07/21/18 0431  BP: 128/80 138/85 (!) 147/99 (!) 151/83  Pulse: (!) 102 96 (!) 108 (!) 115  Resp: '20 16 12 16  ' Temp: 98.2 F (36.8 C) 97.9 F (36.6 C) 98.2 F (36.8 C) 98.5 F (36.9 C)  TempSrc: Oral Oral Oral Oral  SpO2: 97% 97% 98% 99%  Weight:      Height:        General: NAD Cardiovascular: RRR, S1/S2 +, no rubs, no gallops Respiratory: CTA bilaterally, no wheezing, no rhonchi Abdominal: Soft, NT, ND, bowel sounds + Extremities: no edema.    The results of significant diagnostics from this hospitalization (including imaging, microbiology, ancillary and laboratory) are listed below for reference.     Microbiology: Recent Results (from the past 240 hour(s))  Blood Culture (routine x 2)     Status: None (Preliminary result)   Collection Time: 07/20/18 11:55 AM  Result Value Ref Range Status   Specimen Description   Final    BLOOD LEFT HAND Performed at [facility], [address] [place], [place] [address]    Special Requests   Final    BOTTLES DRAWN AEROBIC AND ANAEROBIC Blood Culture adequate volume Performed at [facility], [address] [place], [place] [address]    Culture   Final    NO GROWTH < 24 HOURS Performed at [facility], [place] [address] [place], [place] [address]    Report Status PENDING  Incomplete  Blood Culture (routine x 2)     Status: None (Preliminary result)   Collection Time: 07/20/18 12:05 PM  Result Value Ref Range Status   Specimen Description   Final    BLOOD LEFT FOREARM Performed at [facility], [address] [place], [place] [address]    Special Requests   Final    BOTTLES DRAWN  AEROBIC AND ANAEROBIC Blood Culture adequate volume Performed at [facility], [address], [place], [place] [address]    Culture   Final  NO GROWTH < 24 HOURS Performed at [place] [address] [place], [place] [address]    Report Status PENDING  Incomplete  MRSA PCR Screening     Status: None   Collection Time: 07/21/18  8:15 AM  Result Value Ref Range Status   MRSA by PCR NEGATIVE NEGATIVE Final    Comment:        The GeneXpert MRSA Assay (FDA approved for NASAL specimens only), is one component of a comprehensive MRSA colonization surveillance program. It is not intended to diagnose MRSA infection nor to guide or monitor treatment for MRSA infections. Performed at [facility], [place] [address], [place], [place] [address]      Labs: BNP (last 3 results) No results for input(s): BNP in the last 8760 hours. Basic Metabolic Panel: Recent Labs  Lab 07/20/18 1205 07/21/18 0441  NA 132* 134*  K 4.2 3.6  CL 98 102  CO2 24 25  GLUCOSE 420* 226*  BUN 17 17  CREATININE 0.84 0.81  CALCIUM 9.0 8.2*   Liver Function Tests: Recent Labs  Lab 07/20/18 1205  AST 22  ALT 19  ALKPHOS 76  BILITOT 0.4  PROT 8.1  ALBUMIN 3.4*   No results for input(s): LIPASE, AMYLASE in the last 168 hours. No results for input(s): AMMONIA in the last 168 hours. CBC: Recent Labs  Lab 07/20/18 1205  WBC 8.3  NEUTROABS 4.4  HGB 14.7  HCT 42.7  MCV 80.3  PLT 327   Cardiac Enzymes: No results for input(s): CKTOTAL, CKMB, CKMBINDEX, TROPONINI in the last 168 hours. BNP: Invalid input(s): POCBNP CBG: Recent Labs  Lab 07/20/18 2315 07/21/18 0747 07/21/18 1143  GLUCAP 333* 175* 196*   D-Dimer No results for input(s): DDIMER in the last 72 hours. Hgb A1c No results for input(s): HGBA1C in the last 72 hours. Lipid Profile No results for input(s): CHOL, HDL, LDLCALC, TRIG, CHOLHDL, LDLDIRECT in the last 72 hours. Thyroid function  studies No results for input(s): TSH, T4TOTAL, T3FREE, THYROIDAB in the last 72 hours.  Invalid input(s): FREET3 Anemia work up No results for input(s): VITAMINB12, FOLATE, FERRITIN, TIBC, IRON, RETICCTPCT in the last 72 hours. Urinalysis    Component Value Date/Time   COLORURINE YELLOW 07/20/2018 [place] 07/20/2018 1132   LABSPEC 1.020 07/20/2018 1132   PHURINE 6.0 07/20/2018 1132   GLUCOSEU >=500 (A) 07/20/2018 1132   HGBUR LARGE (A) 07/20/2018 1132   BILIRUBINUR NEGATIVE 07/20/2018 1132   KETONESUR NEGATIVE 07/20/2018 1132   PROTEINUR >300 (A) 07/20/2018 1132   UROBILINOGEN 0.2 07/11/2014 1555   NITRITE NEGATIVE 07/20/2018 1132   LEUKOCYTESUR NEGATIVE 07/20/2018 1132   Sepsis Labs Invalid input(s): PROCALCITONIN,  WBC,  LACTICIDVEN Microbiology Recent Results (from the past 240 hour(s))  Blood Culture (routine x 2)     Status: None (Preliminary result)   Collection Time: 07/20/18 11:55 AM  Result Value Ref Range Status   Specimen Description   Final    BLOOD LEFT HAND Performed at [facility], [address] [place], [place] [address]    Special Requests   Final    BOTTLES DRAWN AEROBIC AND ANAEROBIC Blood Culture adequate volume Performed at [facility], [address] [place], [place] [address]    Culture   Final    NO GROWTH < 24 HOURS Performed at [facility], [place] [address] [place], [place] [address]    Report Status PENDING  Incomplete  Blood Culture (routine x 2)     Status: None (Preliminary result)   Collection Time: 07/20/18 12:05 PM  Result Value Ref Range Status   Specimen Description   Final    BLOOD LEFT FOREARM Performed at [facility], [address] [place], [place] [address]    Special Requests   Final    BOTTLES DRAWN AEROBIC AND ANAEROBIC Blood Culture adequate volume Performed at [facility], [address] [place], [place] [address]    Culture   Final    NO  GROWTH < 24 HOURS Performed at [facility], [place] [address] [place], [place] [address]    Report Status PENDING  Incomplete  MRSA PCR Screening     Status: None   Collection Time: 07/21/18  8:15 AM  Result Value Ref Range Status   MRSA by PCR NEGATIVE NEGATIVE Final    Comment:        The GeneXpert MRSA Assay (FDA approved for NASAL specimens only), is one component of a comprehensive MRSA colonization surveillance program. It is not intended to diagnose MRSA infection nor to guide or monitor treatment for MRSA infections. Performed at [facility], [place] [address], [place], [place] [address]     Time coordinating discharge: 32 minutes  SIGNED:  Chipper Oman, MD  [facility] 07/21/2018, 12:02 PM  Pager please text page via  [email or web address]  Note - This record has been created using [employer]. Chart creation errors have been sought, but may not always have been located. Such creation errors do not reflect on the standard of medical care.

## 2018-07-25 LAB — CULTURE, BLOOD (ROUTINE X 2)
CULTURE: NO GROWTH
Culture: NO GROWTH
SPECIAL REQUESTS: ADEQUATE
Special Requests: ADEQUATE

## 2018-09-06 ENCOUNTER — Encounter (HOSPITAL_BASED_OUTPATIENT_CLINIC_OR_DEPARTMENT_OTHER): Payer: Self-pay | Admitting: *Deleted

## 2018-09-06 ENCOUNTER — Inpatient Hospital Stay (HOSPITAL_BASED_OUTPATIENT_CLINIC_OR_DEPARTMENT_OTHER)
Admission: EM | Admit: 2018-09-06 | Discharge: 2018-09-09 | DRG: 872 | Disposition: A | Discharge status: Home / Self Care | Payer: Medicaid Other | Attending: Internal Medicine | Admitting: Internal Medicine

## 2018-09-06 ENCOUNTER — Emergency Department (HOSPITAL_BASED_OUTPATIENT_CLINIC_OR_DEPARTMENT_OTHER): Payer: Medicaid Other

## 2018-09-06 ENCOUNTER — Other Ambulatory Visit: Payer: Self-pay

## 2018-09-06 DIAGNOSIS — M869 Osteomyelitis, unspecified: Secondary | ICD-10-CM | POA: Diagnosis present

## 2018-09-06 DIAGNOSIS — D638 Anemia in other chronic diseases classified elsewhere: Secondary | ICD-10-CM | POA: Diagnosis not present

## 2018-09-06 DIAGNOSIS — Z9049 Acquired absence of other specified parts of digestive tract: Secondary | ICD-10-CM | POA: Diagnosis not present

## 2018-09-06 DIAGNOSIS — N10 Acute tubulo-interstitial nephritis: Secondary | ICD-10-CM | POA: Diagnosis not present

## 2018-09-06 DIAGNOSIS — K589 Irritable bowel syndrome without diarrhea: Secondary | ICD-10-CM | POA: Diagnosis not present

## 2018-09-06 DIAGNOSIS — E861 Hypovolemia: Secondary | ICD-10-CM | POA: Diagnosis not present

## 2018-09-06 DIAGNOSIS — R109 Unspecified abdominal pain: Secondary | ICD-10-CM

## 2018-09-06 DIAGNOSIS — K219 Gastro-esophageal reflux disease without esophagitis: Secondary | ICD-10-CM | POA: Diagnosis present

## 2018-09-06 DIAGNOSIS — J45909 Unspecified asthma, uncomplicated: Secondary | ICD-10-CM | POA: Diagnosis present

## 2018-09-06 DIAGNOSIS — Z833 Family history of diabetes mellitus: Secondary | ICD-10-CM

## 2018-09-06 DIAGNOSIS — Z8249 Family history of ischemic heart disease and other diseases of the circulatory system: Secondary | ICD-10-CM

## 2018-09-06 DIAGNOSIS — J4530 Mild persistent asthma, uncomplicated: Secondary | ICD-10-CM | POA: Diagnosis not present

## 2018-09-06 DIAGNOSIS — Z794 Long term (current) use of insulin: Secondary | ICD-10-CM

## 2018-09-06 DIAGNOSIS — Z79899 Other long term (current) drug therapy: Secondary | ICD-10-CM | POA: Diagnosis not present

## 2018-09-06 DIAGNOSIS — E860 Dehydration: Secondary | ICD-10-CM | POA: Diagnosis not present

## 2018-09-06 DIAGNOSIS — E1165 Type 2 diabetes mellitus with hyperglycemia: Secondary | ICD-10-CM

## 2018-09-06 DIAGNOSIS — N390 Urinary tract infection, site not specified: Secondary | ICD-10-CM

## 2018-09-06 DIAGNOSIS — N12 Tubulo-interstitial nephritis, not specified as acute or chronic: Secondary | ICD-10-CM | POA: Diagnosis not present

## 2018-09-06 DIAGNOSIS — Z885 Allergy status to narcotic agent status: Secondary | ICD-10-CM

## 2018-09-06 DIAGNOSIS — A419 Sepsis, unspecified organism: Secondary | ICD-10-CM | POA: Diagnosis not present

## 2018-09-06 DIAGNOSIS — I10 Essential (primary) hypertension: Secondary | ICD-10-CM | POA: Diagnosis present

## 2018-09-06 DIAGNOSIS — E871 Hypo-osmolality and hyponatremia: Secondary | ICD-10-CM | POA: Diagnosis not present

## 2018-09-06 DIAGNOSIS — Z791 Long term (current) use of non-steroidal anti-inflammatories (NSAID): Secondary | ICD-10-CM | POA: Diagnosis not present

## 2018-09-06 DIAGNOSIS — N179 Acute kidney failure, unspecified: Secondary | ICD-10-CM | POA: Diagnosis not present

## 2018-09-06 DIAGNOSIS — G2581 Restless legs syndrome: Secondary | ICD-10-CM | POA: Diagnosis present

## 2018-09-06 DIAGNOSIS — Z89421 Acquired absence of other right toe(s): Secondary | ICD-10-CM

## 2018-09-06 DIAGNOSIS — E1169 Type 2 diabetes mellitus with other specified complication: Secondary | ICD-10-CM

## 2018-09-06 DIAGNOSIS — IMO0002 Reserved for concepts with insufficient information to code with codable children: Secondary | ICD-10-CM | POA: Diagnosis present

## 2018-09-06 LAB — COMPREHENSIVE METABOLIC PANEL
ALT: 22 U/L (ref 0–44)
AST: 19 U/L (ref 15–41)
Albumin: 3.8 g/dL (ref 3.5–5.0)
Alkaline Phosphatase: 95 U/L (ref 38–126)
Anion gap: 12 (ref 5–15)
BILIRUBIN TOTAL: 0.6 mg/dL (ref 0.3–1.2)
BUN: 36 mg/dL — ABNORMAL HIGH (ref 6–20)
CO2: 23 mmol/L (ref 22–32)
CREATININE: 1.56 mg/dL — AB (ref 0.44–1.00)
Calcium: 9.8 mg/dL (ref 8.9–10.3)
Chloride: 95 mmol/L — ABNORMAL LOW (ref 98–111)
GFR calc non Af Amer: 41 mL/min — ABNORMAL LOW (ref >60)
GFR, EST AFRICAN AMERICAN: 48 mL/min — AB (ref >60)
GLUCOSE: 409 mg/dL — AB (ref 70–99)
Potassium: 5 mmol/L (ref 3.5–5.1)
SODIUM: 130 mmol/L — AB (ref 135–145)
Total Protein: 8.7 g/dL — ABNORMAL HIGH (ref 6.5–8.1)

## 2018-09-06 LAB — BASIC METABOLIC PANEL
ANION GAP: 9 (ref 5–15)
BUN: 36 mg/dL — ABNORMAL HIGH (ref 6–20)
CO2: 22 mmol/L (ref 22–32)
Calcium: 8.7 mg/dL — ABNORMAL LOW (ref 8.9–10.3)
Chloride: 100 mmol/L (ref 98–111)
Creatinine, Ser: 1.61 mg/dL — ABNORMAL HIGH (ref 0.44–1.00)
GFR, EST AFRICAN AMERICAN: 46 mL/min — AB (ref >60)
GFR, EST NON AFRICAN AMERICAN: 40 mL/min — AB (ref >60)
GLUCOSE: 430 mg/dL — AB (ref 70–99)
Potassium: 5.6 mmol/L — ABNORMAL HIGH (ref 3.5–5.1)
SODIUM: 131 mmol/L — AB (ref 135–145)

## 2018-09-06 LAB — CBC WITH DIFFERENTIAL/PLATELET
ABS IMMATURE GRANULOCYTES: 0.06 10*3/uL (ref 0.00–0.07)
Abs Immature Granulocytes: 0.05 10*3/uL (ref 0.00–0.07)
BASOS ABS: 0.1 10*3/uL (ref 0.0–0.1)
Basophils Absolute: 0.1 10*3/uL (ref 0.0–0.1)
Basophils Relative: 1 %
Basophils Relative: 1 %
EOS ABS: 0.1 10*3/uL (ref 0.0–0.5)
EOS PCT: 2 %
EOS PCT: 1 %
Eosinophils Absolute: 0.3 10*3/uL (ref 0.0–0.5)
HEMATOCRIT: 43.9 % (ref 36.0–46.0)
HEMATOCRIT: 38.5 % (ref 36.0–46.0)
Hemoglobin: 14.9 g/dL (ref 12.0–15.0)
Hemoglobin: 12.7 g/dL (ref 12.0–15.0)
Immature Granulocytes: 1 %
Immature Granulocytes: 1 %
LYMPHS ABS: 2.4 10*3/uL (ref 0.7–4.0)
LYMPHS ABS: 2 10*3/uL (ref 0.7–4.0)
LYMPHS PCT: 19 %
Lymphocytes Relative: 19 %
MCH: 27.9 pg (ref 26.0–34.0)
MCH: 28.1 pg (ref 26.0–34.0)
MCHC: 33.9 g/dL (ref 30.0–36.0)
MCHC: 33 g/dL (ref 30.0–36.0)
MCV: 82.1 fL (ref 80.0–100.0)
MCV: 85.2 fL (ref 80.0–100.0)
MONO ABS: 0.7 10*3/uL (ref 0.1–1.0)
MONOS PCT: 5 %
Monocytes Absolute: 0.5 10*3/uL (ref 0.1–1.0)
Monocytes Relative: 5 %
NEUTROS ABS: 9.3 10*3/uL — AB (ref 1.7–7.7)
NEUTROS PCT: 73 %
NRBC: 0 % (ref 0.0–0.2)
Neutro Abs: 8 10*3/uL — ABNORMAL HIGH (ref 1.7–7.7)
Neutrophils Relative %: 72 %
PLATELETS: 315 10*3/uL (ref 150–400)
Platelets: 399 10*3/uL (ref 150–400)
RBC: 5.35 MIL/uL — AB (ref 3.87–5.11)
RBC: 4.52 MIL/uL (ref 3.87–5.11)
RDW: 13.7 % (ref 11.5–15.5)
RDW: 14 % (ref 11.5–15.5)
WBC: 12.9 10*3/uL — AB (ref 4.0–10.5)
WBC: 10.7 10*3/uL — AB (ref 4.0–10.5)
nRBC: 0 % (ref 0.0–0.2)

## 2018-09-06 LAB — GLUCOSE, CAPILLARY
GLUCOSE-CAPILLARY: 371 mg/dL — AB (ref 70–99)
GLUCOSE-CAPILLARY: 356 mg/dL — AB (ref 70–99)
Glucose-Capillary: 276 mg/dL — ABNORMAL HIGH (ref 70–99)
Glucose-Capillary: 298 mg/dL — ABNORMAL HIGH (ref 70–99)
Glucose-Capillary: 322 mg/dL — ABNORMAL HIGH (ref 70–99)

## 2018-09-06 LAB — URINALYSIS, MICROSCOPIC (REFLEX)

## 2018-09-06 LAB — URINALYSIS, ROUTINE W REFLEX MICROSCOPIC
BILIRUBIN URINE: NEGATIVE
KETONES UR: NEGATIVE mg/dL
Leukocytes, UA: NEGATIVE
Nitrite: NEGATIVE
PH: 5 (ref 5.0–8.0)
Protein, ur: 100 mg/dL — AB

## 2018-09-06 LAB — RAPID URINE DRUG SCREEN, HOSP PERFORMED
AMPHETAMINES: NOT DETECTED
Barbiturates: NOT DETECTED
Benzodiazepines: NOT DETECTED
Cocaine: NOT DETECTED
OPIATES: NOT DETECTED
TETRAHYDROCANNABINOL: NOT DETECTED

## 2018-09-06 LAB — PREGNANCY, URINE: Preg Test, Ur: NEGATIVE

## 2018-09-06 LAB — TROPONIN I

## 2018-09-06 LAB — LACTIC ACID, PLASMA: LACTIC ACID, VENOUS: 1.6 mmol/L (ref 0.5–1.9)

## 2018-09-06 LAB — CBG MONITORING, ED
GLUCOSE-CAPILLARY: 396 mg/dL — AB (ref 70–99)
GLUCOSE-CAPILLARY: 426 mg/dL — AB (ref 70–99)
Glucose-Capillary: 432 mg/dL — ABNORMAL HIGH (ref 70–99)

## 2018-09-06 MED ORDER — SODIUM CHLORIDE 0.9 % IV SOLN
1.0000 g | Freq: Two times a day (BID) | INTRAVENOUS | Status: DC
  Filled 2018-09-06: qty 1

## 2018-09-06 MED ORDER — FENTANYL CITRATE (PF) 100 MCG/2ML IJ SOLN
50.0000 ug | Freq: Once | INTRAMUSCULAR | Status: AC
  Administered 2018-09-06: 50 ug via INTRAVENOUS
  Filled 2018-09-06: qty 2

## 2018-09-06 MED ORDER — SODIUM CHLORIDE 0.9 % IV SOLN
INTRAVENOUS | Status: DC
  Administered 2018-09-06 – 2018-09-09 (×5): via INTRAVENOUS

## 2018-09-06 MED ORDER — SODIUM CHLORIDE 0.9 % IV BOLUS
1000.0000 mL | Freq: Once | INTRAVENOUS | Status: AC
  Administered 2018-09-06: 1000 mL via INTRAVENOUS

## 2018-09-06 MED ORDER — KETOROLAC TROMETHAMINE 30 MG/ML IJ SOLN
15.0000 mg | Freq: Once | INTRAMUSCULAR | Status: AC
  Administered 2018-09-06: 15 mg via INTRAVENOUS
  Filled 2018-09-06: qty 1

## 2018-09-06 MED ORDER — KETOROLAC TROMETHAMINE 15 MG/ML IJ SOLN
15.0000 mg | Freq: Three times a day (TID) | INTRAMUSCULAR | Status: DC | PRN
  Administered 2018-09-06 – 2018-09-08 (×4): 15 mg via INTRAVENOUS
  Filled 2018-09-06 (×4): qty 1

## 2018-09-06 MED ORDER — SODIUM CHLORIDE 0.9% FLUSH
3.0000 mL | Freq: Two times a day (BID) | INTRAVENOUS | Status: DC
  Administered 2018-09-06 – 2018-09-08 (×2): 3 mL via INTRAVENOUS

## 2018-09-06 MED ORDER — BISACODYL 5 MG PO TBEC: 5.0000 mg | DELAYED_RELEASE_TABLET | Freq: Every day | ORAL | Status: DC | PRN

## 2018-09-06 MED ORDER — ALBUTEROL SULFATE (2.5 MG/3ML) 0.083% IN NEBU: 2.5000 mg | INHALATION_SOLUTION | Freq: Four times a day (QID) | RESPIRATORY_TRACT | Status: DC | PRN

## 2018-09-06 MED ORDER — ONDANSETRON HCL 4 MG/2ML IJ SOLN
4.0000 mg | Freq: Once | INTRAMUSCULAR | Status: AC
  Administered 2018-09-06: 4 mg via INTRAVENOUS
  Filled 2018-09-06: qty 2

## 2018-09-06 MED ORDER — INSULIN GLARGINE 100 UNIT/ML ~~LOC~~ SOLN
50.0000 [IU] | Freq: Every day | SUBCUTANEOUS | Status: DC
  Administered 2018-09-06: 50 [IU] via SUBCUTANEOUS
  Filled 2018-09-06 (×2): qty 0.5

## 2018-09-06 MED ORDER — ACETAMINOPHEN 325 MG PO TABS
650.0000 mg | ORAL_TABLET | Freq: Four times a day (QID) | ORAL | Status: DC | PRN
  Administered 2018-09-07: 650 mg via ORAL
  Filled 2018-09-06: qty 2

## 2018-09-06 MED ORDER — SODIUM CHLORIDE 0.9 % IV SOLN
1.0000 g | Freq: Once | INTRAVENOUS | Status: AC
  Administered 2018-09-06: 1 g via INTRAVENOUS
  Filled 2018-09-06: qty 10

## 2018-09-06 MED ORDER — INSULIN GLARGINE 100 UNIT/ML ~~LOC~~ SOLN: 45.0000 [IU] | Freq: Every day | SUBCUTANEOUS | Status: DC

## 2018-09-06 MED ORDER — INSULIN ASPART 100 UNIT/ML ~~LOC~~ SOLN
0.0000 [IU] | Freq: Three times a day (TID) | SUBCUTANEOUS | Status: DC
  Administered 2018-09-06: 8 [IU] via SUBCUTANEOUS
  Administered 2018-09-06: 15 [IU] via SUBCUTANEOUS
  Administered 2018-09-06: 11 [IU] via SUBCUTANEOUS
  Administered 2018-09-07: 8 [IU] via SUBCUTANEOUS

## 2018-09-06 MED ORDER — SODIUM CHLORIDE 0.9 % IV SOLN
INTRAVENOUS | Status: AC
  Administered 2018-09-06 (×2): via INTRAVENOUS

## 2018-09-06 MED ORDER — TRAMADOL HCL 50 MG PO TABS
50.0000 mg | ORAL_TABLET | Freq: Four times a day (QID) | ORAL | Status: DC | PRN
  Administered 2018-09-06 – 2018-09-08 (×4): 50 mg via ORAL
  Filled 2018-09-06 (×4): qty 1

## 2018-09-06 MED ORDER — ONDANSETRON HCL 4 MG/2ML IJ SOLN
4.0000 mg | Freq: Four times a day (QID) | INTRAMUSCULAR | Status: DC | PRN
  Administered 2018-09-06 – 2018-09-09 (×4): 4 mg via INTRAVENOUS
  Filled 2018-09-06 (×4): qty 2

## 2018-09-06 MED ORDER — ACETAMINOPHEN 650 MG RE SUPP: 650.0000 mg | Freq: Four times a day (QID) | RECTAL | Status: DC | PRN

## 2018-09-06 MED ORDER — SENNOSIDES-DOCUSATE SODIUM 8.6-50 MG PO TABS: 1.0000 | ORAL_TABLET | Freq: Every evening | ORAL | Status: DC | PRN

## 2018-09-06 MED ORDER — ONDANSETRON HCL 4 MG PO TABS: 4.0000 mg | ORAL_TABLET | Freq: Four times a day (QID) | ORAL | Status: DC | PRN

## 2018-09-06 MED ORDER — ALBUTEROL SULFATE HFA 108 (90 BASE) MCG/ACT IN AERS: 2.0000 | INHALATION_SPRAY | Freq: Four times a day (QID) | RESPIRATORY_TRACT | Status: DC | PRN

## 2018-09-06 MED ORDER — INSULIN ASPART 100 UNIT/ML ~~LOC~~ SOLN
0.0000 [IU] | Freq: Every day | SUBCUTANEOUS | Status: DC
  Administered 2018-09-06: 5 [IU] via SUBCUTANEOUS
  Administered 2018-09-07 – 2018-09-08 (×2): 2 [IU] via SUBCUTANEOUS

## 2018-09-06 MED ORDER — ENOXAPARIN SODIUM 40 MG/0.4ML ~~LOC~~ SOLN
40.0000 mg | SUBCUTANEOUS | Status: DC
  Administered 2018-09-06 – 2018-09-09 (×4): 40 mg via SUBCUTANEOUS
  Filled 2018-09-06 (×4): qty 0.4

## 2018-09-06 MED ORDER — INSULIN REGULAR HUMAN 100 UNIT/ML IJ SOLN
4.0000 [IU] | Freq: Once | INTRAMUSCULAR | Status: AC
  Administered 2018-09-06: 4 [IU] via SUBCUTANEOUS
  Filled 2018-09-06: qty 1

## 2018-09-06 MED ORDER — GABAPENTIN 300 MG PO CAPS
600.0000 mg | ORAL_CAPSULE | Freq: Two times a day (BID) | ORAL | Status: DC
  Administered 2018-09-06 – 2018-09-09 (×7): 600 mg via ORAL
  Filled 2018-09-06 (×7): qty 2

## 2018-09-06 MED ORDER — SODIUM CHLORIDE 0.9 % IV SOLN
2.0000 g | Freq: Once | INTRAVENOUS | Status: AC
  Administered 2018-09-06: 2 g via INTRAVENOUS
  Filled 2018-09-06: qty 2

## 2018-09-06 MED ORDER — SODIUM CHLORIDE 0.9 % IV SOLN
2.0000 g | Freq: Two times a day (BID) | INTRAVENOUS | Status: DC
  Administered 2018-09-06 – 2018-09-09 (×6): 2 g via INTRAVENOUS
  Filled 2018-09-06 (×6): qty 2

## 2018-09-06 MED ORDER — FAMOTIDINE 20 MG PO TABS
20.0000 mg | ORAL_TABLET | Freq: Two times a day (BID) | ORAL | Status: DC
  Administered 2018-09-06 – 2018-09-09 (×7): 20 mg via ORAL
  Filled 2018-09-06 (×7): qty 1

## 2018-09-06 NOTE — ED Notes (Signed)
Need upreg prior to imaging

## 2018-09-06 NOTE — ED Notes (Signed)
Patient transported to CT 

## 2018-09-06 NOTE — ED Triage Notes (Signed)
Pt co abd pain n/v and dizziness x 2 days

## 2018-09-06 NOTE — Progress Notes (Signed)
Pharmacy Antibiotic Note  Lindsay Love is a 37 y.o. female admitted on 09/06/2018 with sepsis and UTI.  Pharmacy has been consulted for Cefepime dosing.  Plan: Cefepime 2gm iv x1, then 1gm iv q12hr  Height: 5\' 7"  (170.2 cm) Weight: 250 lb (113.4 kg) IBW/kg (Calculated) : 61.6  Temp (24hrs), Avg:98.4 F (36.9 C), Min:98.1 F (36.7 C), Max:98.8 F (37.1 C)  Recent Labs  Lab 09/06/18 0113  WBC 12.9*  CREATININE 1.56*    Estimated Creatinine Clearance: 64.2 mL/min (A) (by C-G formula based on SCr of 1.56 mg/dL (H)).    Allergies  Allergen Reactions  . Morphine And Related Nausea And Vomiting    Antimicrobials this admission: Cefepime 09/06/2018 >>   Dose adjustments this admission: -  Microbiology results: -  Thank you for allowing pharmacy to be a part of this patient's care.  Aleene Davidson Crowford 09/06/2018 5:42 AM

## 2018-09-06 NOTE — ED Provider Notes (Signed)
[facility] EMERGENCY DEPARTMENT Provider Note   CSN: [id number] Arrival date & time: 09/06/18  0045     History   Chief Complaint Chief Complaint  Patient presents with  . Abdominal Pain    HPI Lindsay Love is a 37 y.o. female.  The history is provided by the patient.  Flank Pain  This is a new problem. The current episode started more than 2 days ago. The problem occurs constantly. The problem has not changed since onset.Pertinent negatives include no chest pain, no abdominal pain, no headaches and no shortness of breath. Nothing aggravates the symptoms. Nothing relieves the symptoms. She has tried nothing for the symptoms. The treatment provided no relief.  Emesis   This is a new problem. The problem occurs more than 10 times per day. The problem has not changed since onset.The emesis has an appearance of stomach contents. There has been no fever. Pertinent negatives include no abdominal pain, no diarrhea, no fever and no headaches. Associated symptoms comments: lightheadedness.  Patient with DM with flank   Past Medical History:  Diagnosis Date  . Asthma   . Cellulitis of left foot   . Diabetes mellitus without complication ([place])   . GERD (gastroesophageal reflux disease) 10/27/2017  . IBS (irritable bowel syndrome)   . RLS (restless legs syndrome) 10/27/2017    Patient Active Problem List   Diagnosis Date Noted  . UTI (urinary tract infection) 07/20/2018  . H/O amputation of lesser toe, right ([place]) 11/07/2017  . Right foot infection 10/27/2017  . Cellulitis in diabetic foot ([place]) 10/27/2017  . Hyperglycemia 10/27/2017  . URI (upper respiratory infection) 10/27/2017  . Hyponatremia 10/27/2017  . Lactic acidosis 10/27/2017  . GERD (gastroesophageal reflux disease) 10/27/2017  . RLS (restless legs syndrome) 10/27/2017  . Vaginal candidiasis 10/27/2017  . Insulin dependent diabetes mellitus ([place]) 10/22/2016  . IBS (irritable bowel syndrome) 10/22/2016    . Asthma 10/22/2016  . Right leg pain 10/22/2016  . Cellulitis and abscess of toe of right foot   . Diabetic peripheral neuropathy ([place])   . Failure of outpatient treatment     Past Surgical History:  Procedure Laterality Date  . AMPUTATION Right 11/01/2017   Procedure: RIGHT FOOT 5TH RAY AMPUTATION;  Surgeon: Newt Minion, MD;  Location: [facility];  Service: Orthopedics;  Laterality: Right;  . CESAREAN SECTION    . CHOLECYSTECTOMY    . CYST EXCISION    . TONSILLECTOMY    . TOOTH EXTRACTION       OB History   None      Home Medications    Prior to Admission medications   Medication Sig Start Date End Date Taking? Authorizing Provider  albuterol (PROVENTIL HFA;VENTOLIN HFA) 108 (90 Base) MCG/ACT inhaler Inhale 2 puffs into the lungs every 6 (six) hours as needed for wheezing or shortness of breath.    [provider]  blood glucose meter kit and supplies Dispense based on patient and insurance preference. Use up to four times daily as directed. (FOR ICD-9 250.00, 250.01). 10/25/16   Patrecia Pour, Christean Grief, MD  docusate sodium (COLACE) 100 MG capsule Take 1 capsule (100 mg total) by mouth 2 (two) times daily. For constipation Patient taking differently: Take 100 mg by mouth 2 (two) times daily as needed. For constipation 11/02/17   Rai, Vernelle Emerald, MD  famotidine (PEPCID) 20 MG tablet Take 1 tablet (20 mg total) by mouth 2 (two) times daily. Patient not taking: Reported on 07/20/2018 10/25/16  Patrecia Pour, Christean Grief, MD  gabapentin (NEURONTIN) 300 MG capsule Take 600 mg by mouth 2 (two) times daily.    [provider]  ibuprofen (ADVIL,MOTRIN) 200 MG tablet Take 600 mg by mouth every 6 (six) hours as needed for headache or moderate pain.    [provider]  insulin aspart (NOVOLOG) 100 UNIT/ML injection Inject 35 Units into the skin 3 (three) times daily with meals. Patient taking differently: Inject 25-50 Units into the skin See admin instructions. 3-4 times  daily.  "Sliding scale plus 20 units." 11/02/17   Rai, Ripudeep K, MD  Insulin Degludec (TRESIBA FLEXTOUCH) 200 UNIT/ML SOPN Inject 100 Units into the skin every morning.    [provider]  lisinopril (PRINIVIL,ZESTRIL) 10 MG tablet Take 10 mg by mouth daily. 07/09/18   [provider]  loratadine (CLARITIN) 10 MG tablet Take 1 tablet (10 mg total) by mouth daily. 11/02/17   Rai, Vernelle Emerald, MD  metFORMIN (GLUCOPHAGE) 500 MG tablet Take 1 tablet (500 mg total) by mouth 2 (two) times daily with a meal. 11/02/17   Rai, Ripudeep K, MD  mometasone-formoterol (DULERA) 200-5 MCG/ACT AERO Inhale 2 puffs into the lungs 2 (two) times daily. Patient not taking: Reported on 07/20/2018 11/02/17   Mendel Corning, MD    Family History Family History  Problem Relation Age of Onset  . Diabetes Mother   . Cancer Mother   . Irritable bowel syndrome Mother   . Hypertension Mother   . Sleep apnea Mother   . Diabetes Father   . Restless legs syndrome Father     Social History Social History   Tobacco Use  . Smoking status: Never Smoker  . Smokeless tobacco: Never Used  Substance Use Topics  . Alcohol use: No  . Drug use: No     Allergies   Morphine and related   Review of Systems Review of Systems  Constitutional: Negative for fever.  Respiratory: Negative for shortness of breath.   Cardiovascular: Negative for chest pain.  Gastrointestinal: Positive for nausea and vomiting. Negative for abdominal pain and diarrhea.  Genitourinary: Positive for flank pain. Negative for dysuria.  Neurological: Positive for light-headedness. Negative for headaches.  All other systems reviewed and are negative.    Physical Exam Updated Vital Signs BP 132/86   Pulse (!) 111   Temp 98.8 F (37.1 C) (Oral)   Resp 18   Ht _0  (1.702 m)   Wt 113.4 kg   LMP 08/14/2018   SpO2 94%   BMI 39.16 kg/m   Physical Exam  Constitutional: She is oriented to person, place, and time. She appears  well-developed and well-nourished.  HENT:  Head: Normocephalic and atraumatic.  Mouth/Throat: Oropharynx is clear and moist. No oropharyngeal exudate.  Eyes: Pupils are equal, round, and reactive to light. Conjunctivae are normal.  Neck: Normal range of motion. Neck supple.  Cardiovascular: Regular rhythm, normal heart sounds and intact distal pulses. Tachycardia present.  Pulmonary/Chest: Effort normal and breath sounds normal. No stridor. No respiratory distress. She has no wheezes. She has no rales.  Abdominal: Soft. Bowel sounds are normal. She exhibits no mass. There is no tenderness. There is no rebound and no guarding.  Musculoskeletal: Normal range of motion.  Neurological: She is alert and oriented to person, place, and time. She displays normal reflexes.  Skin: Skin is warm and dry. Capillary refill takes less than 2 seconds. She is not diaphoretic.  Psychiatric: She has a normal mood and affect.  ED Treatments / Results  Labs (all labs ordered are listed, but only abnormal results are displayed) Results for orders placed or performed during the hospital encounter of 09/06/18  Urinalysis, Routine w reflex microscopic  Result Value Ref Range   Color, Urine YELLOW YELLOW   APPearance CLOUDY (A) CLEAR   Specific Gravity, Urine >1.030 (H) 1.005 - 1.030   pH 5.0 5.0 - 8.0   Glucose, UA >=500 (A) NEGATIVE mg/dL   Hgb urine dipstick MODERATE (A) NEGATIVE   Bilirubin Urine NEGATIVE NEGATIVE   Ketones, ur NEGATIVE NEGATIVE mg/dL   Protein, ur 100 (A) NEGATIVE mg/dL   Nitrite NEGATIVE NEGATIVE   Leukocytes, UA NEGATIVE NEGATIVE  Pregnancy, urine  Result Value Ref Range   Preg Test, Ur NEGATIVE NEGATIVE  CBC with Differential  Result Value Ref Range   WBC 12.9 (H) 4.0 - 10.5 K/uL   RBC 5.35 (H) 3.87 - 5.11 MIL/uL   Hemoglobin 14.9 12.0 - 15.0 g/dL   HCT 43.9 36.0 - 46.0 %   MCV 82.1 80.0 - 100.0 fL   MCH 27.9 26.0 - 34.0 pg   MCHC 33.9 30.0 - 36.0 g/dL   RDW 13.7 11.5 -  15.5 %   Platelets 399 150 - 400 K/uL   nRBC 0.0 0.0 - 0.2 %   Neutrophils Relative % 72 %   Neutro Abs 9.3 (H) 1.7 - 7.7 K/uL   Lymphocytes Relative 19 %   Lymphs Abs 2.4 0.7 - 4.0 K/uL   Monocytes Relative 5 %   Monocytes Absolute 0.7 0.1 - 1.0 K/uL   Eosinophils Relative 2 %   Eosinophils Absolute 0.3 0.0 - 0.5 K/uL   Basophils Relative 1 %   Basophils Absolute 0.1 0.0 - 0.1 K/uL   Immature Granulocytes 1 %   Abs Immature Granulocytes 0.06 0.00 - 0.07 K/uL  Comprehensive metabolic panel  Result Value Ref Range   Sodium 130 (L) 135 - 145 mmol/L   Potassium 5.0 3.5 - 5.1 mmol/L   Chloride 95 (L) 98 - 111 mmol/L   CO2 23 22 - 32 mmol/L   Glucose, Bld 409 (H) 70 - 99 mg/dL   BUN 36 (H) 6 - 20 mg/dL   Creatinine, Ser 1.56 (H) 0.44 - 1.00 mg/dL   Calcium 9.8 8.9 - 10.3 mg/dL   Total Protein 8.7 (H) 6.5 - 8.1 g/dL   Albumin 3.8 3.5 - 5.0 g/dL   AST 19 15 - 41 U/L   ALT 22 0 - 44 U/L   Alkaline Phosphatase 95 38 - 126 U/L   Total Bilirubin 0.6 0.3 - 1.2 mg/dL   GFR calc non Af Amer 41 (L) >60 mL/min   GFR calc Af Amer 48 (L) >60 mL/min   Anion gap 12 5 - 15  Rapid urine drug screen (hospital performed)  Result Value Ref Range   Opiates NONE DETECTED NONE DETECTED   Cocaine NONE DETECTED NONE DETECTED   Benzodiazepines NONE DETECTED NONE DETECTED   Amphetamines NONE DETECTED NONE DETECTED   Tetrahydrocannabinol NONE DETECTED NONE DETECTED   Barbiturates NONE DETECTED NONE DETECTED  Troponin I  Result Value Ref Range   Troponin I <0.03 <0.03 ng/mL  Urinalysis, Microscopic (reflex)  Result Value Ref Range   RBC / HPF 6-10 0 - 5 RBC/hpf   WBC, UA 6-10 0 - 5 WBC/hpf   Bacteria, UA MANY (A) NONE SEEN   Squamous Epithelial / LPF 11-20 0 - 5   Non Squamous Epithelial PRESENT (A)  NONE SEEN   Budding Yeast PRESENT    Granular Casts, UA PRESENT   POC CBG, ED  Result Value Ref Range   Glucose-Capillary 432 (H) 70 - 99 mg/dL  POC CBG, ED  Result Value Ref Range    Glucose-Capillary 396 (H) 70 - 99 mg/dL   Ct Renal Stone Study  Result Date: 09/06/2018 CLINICAL DATA:  Left-sided abdominal pain for 2 days. Nausea, vomiting. EXAM: CT ABDOMEN AND PELVIS WITHOUT CONTRAST TECHNIQUE: Multidetector CT imaging of the abdomen and pelvis was performed following the standard protocol without IV contrast. COMPARISON:  08/11/2017 FINDINGS: Lower chest: Lung bases are clear. Hepatobiliary: No focal liver abnormality is seen. Status post cholecystectomy. No biliary dilatation. Pancreas: Unremarkable. No pancreatic ductal dilatation or surrounding inflammatory changes. Spleen: Normal in size without focal abnormality. Adrenals/Urinary Tract: No adrenal gland nodules. There is diffuse infiltration in the fat around the left kidney with enlargement of the left kidney in comparison to the right. Mild prominence of adjacent lymph nodes, likely reactive. No hydronephrosis or hydroureter. No stones identified. Bladder wall is not thickened and no bladder stones. Right kidney in ureter are unremarkable. Changes most likely to represent inflammatory process such as pyelonephritis. Sequela of a recently passed stone could also have this appearance but less likely due to lack of hydronephrosis. Stomach/Bowel: Stomach, small bowel, and colon are mostly decompressed. No inflammatory changes identified with respect to the colon. Appendix is not identified. Vascular/Lymphatic: No significant vascular findings are present. No enlarged abdominal or pelvic lymph nodes. Reproductive: Uterus is somewhat nodular, possibly representing a uterine fibroid. Ovaries are not enlarged. Other: No free air or free fluid in the abdomen. Abdominal wall musculature appears intact. Musculoskeletal: No acute or significant osseous findings. IMPRESSION: Diffuse infiltration in the fat around the left kidney with enlargement of the left kidney in comparison to the right. No hydronephrosis or hydroureter. Mild prominence of  lymph nodes, likely reactive. Changes likely to represent inflammatory process such as pyelonephritis or, less likely, recently passed stone. Electronically Signed   By: Lucienne Capers M.D.   On: 09/06/2018 02:54    EKG  EKG Interpretation  Date/Time:  Saturday September 06 2018 02:14:44 EDT Ventricular Rate:  115 PR Interval:    QRS Duration: 85 QT Interval:  324 QTC Calculation: 449 R Axis:   73 Text Interpretation:  Sinus tachycardia Confirmed by Dory Horn) on 09/06/2018 3:11:08 AM       Radiology Ct Renal Stone Study  Result Date: 09/06/2018 CLINICAL DATA:  Left-sided abdominal pain for 2 days. Nausea, vomiting. EXAM: CT ABDOMEN AND PELVIS WITHOUT CONTRAST TECHNIQUE: Multidetector CT imaging of the abdomen and pelvis was performed following the standard protocol without IV contrast. COMPARISON:  08/11/2017 FINDINGS: Lower chest: Lung bases are clear. Hepatobiliary: No focal liver abnormality is seen. Status post cholecystectomy. No biliary dilatation. Pancreas: Unremarkable. No pancreatic ductal dilatation or surrounding inflammatory changes. Spleen: Normal in size without focal abnormality. Adrenals/Urinary Tract: No adrenal gland nodules. There is diffuse infiltration in the fat around the left kidney with enlargement of the left kidney in comparison to the right. Mild prominence of adjacent lymph nodes, likely reactive. No hydronephrosis or hydroureter. No stones identified. Bladder wall is not thickened and no bladder stones. Right kidney in ureter are unremarkable. Changes most likely to represent inflammatory process such as pyelonephritis. Sequela of a recently passed stone could also have this appearance but less likely due to lack of hydronephrosis. Stomach/Bowel: Stomach, small bowel, and colon are mostly decompressed.  No inflammatory changes identified with respect to the colon. Appendix is not identified. Vascular/Lymphatic: No significant vascular findings are  present. No enlarged abdominal or pelvic lymph nodes. Reproductive: Uterus is somewhat nodular, possibly representing a uterine fibroid. Ovaries are not enlarged. Other: No free air or free fluid in the abdomen. Abdominal wall musculature appears intact. Musculoskeletal: No acute or significant osseous findings. IMPRESSION: Diffuse infiltration in the fat around the left kidney with enlargement of the left kidney in comparison to the right. No hydronephrosis or hydroureter. Mild prominence of lymph nodes, likely reactive. Changes likely to represent inflammatory process such as pyelonephritis or, less likely, recently passed stone. Electronically Signed   By: Lucienne Capers M.D.   On: 09/06/2018 02:54    Procedures Procedures (including critical care time)  Medications Ordered in ED Medications  sodium chloride 0.9 % bolus 1,000 mL (1,000 mLs Intravenous New Bag/Given 09/06/18 0303)  cefTRIAXone (ROCEPHIN) 1 g in sodium chloride 0.9 % 100 mL IVPB (1 g Intravenous New Bag/Given 09/06/18 0304)  fentaNYL (SUBLIMAZE) injection 50 mcg (has no administration in time range)  ketorolac (TORADOL) 30 MG/ML injection 15 mg (has no administration in time range)  insulin regular (NOVOLIN R,HUMULIN R) 100 units/mL injection 4 Units (has no administration in time range)  sodium chloride 0.9 % bolus 1,000 mL (0 mLs Intravenous Stopped 09/06/18 0249)  ondansetron (ZOFRAN) injection 4 mg (4 mg Intravenous Given 09/06/18 0209)       Final Clinical Impressions(s) / ED Diagnoses   Final diagnoses:  Pyelonephritis  Dehydration   Admit for pyelonephritis with dehydration and elevated glucose      Linzi Ohlinger, MD 09/06/18 [id number]

## 2018-09-06 NOTE — Plan of Care (Signed)
  Problem: Pain Managment: Goal: General experience of comfort will improve Outcome: Progressing   

## 2018-09-06 NOTE — H&P (Signed)
History and Physical    Lindsay Love IOM:[id number] DOB: [birth year] DOA: 09/06/2018  PCP: Debbora Lacrosse, FNP   Patient coming from: Home, by way of [facility]   Chief Complaint: Left flank and abdominal pain, dysuria, chills, N/V   HPI: Lindsay Love is a 37 y.o. female with medical history significant for poorly controlled insulin-dependent diabetes mellitus with history of osteomyelitis involving the right foot, hypertension, and asthma, now presenting to the emergency department with 2 days of pain in the abdomen and left flank, chills, dysuria, nausea, and nonbloody vomiting.  Patient states that she was in her usual condition until 09/04/2018 when she developed pain in the left flank and dysuria.  Symptoms became severe the following day with chills, increased pain, lightheadedness, and nausea with nonbloody vomiting.  This persisted into the night and prompted her presentation to the ED.  Reports that her right foot is healing well after surgery and she completed a course of doxycycline last month after surgical cultures grew MRSA sensitive to doxycycline.  She reports a mild cough but denies shortness of breath, denies chest pain, and denies diarrhea.  [facility] ED Course: Upon arrival to the ED, patient is found to be afebrile, saturating well on room air, tachycardic in the 120s, and with stable blood pressure.  EKG features a sinus tachycardia with rate 115.  Chemistry panel is notable for sodium of 130, glucose 409, BUN 36, and creatinine of 1.56, up from a baseline of 0.8.  CBC is notable for leukocytosis to 12,900, troponin is undetectable, UDS is negative, and urinalysis features glucosuria, proteinuria, and microscopic hematuria.  CT of the abdomen and pelvis reveals diffuse infiltration in the perinephric fat on the left with enlargement of the kidney, but no hydronephrosis or hydroureter, and with findings likely reflecting pyelonephritis.  Urine and blood  cultures were collected, 2 L normal saline administered, and the patient was treated with Toradol, insulin, Zofran, and 1 g IV Rocephin in the ED.  Heart rate improved with fluids, blood pressure remained stable, and the patient will be observed at [facility] long for ongoing evaluation and management of sepsis secondary to pyelonephritis.  Review of Systems:  All other systems reviewed and apart from HPI, are negative.  Past Medical History:  Diagnosis Date  . Asthma   . Cellulitis of left foot   . Diabetes mellitus without complication ([place])   . GERD (gastroesophageal reflux disease) 10/27/2017  . IBS (irritable bowel syndrome)   . RLS (restless legs syndrome) 10/27/2017    Past Surgical History:  Procedure Laterality Date  . AMPUTATION Right 11/01/2017   Procedure: RIGHT FOOT 5TH RAY AMPUTATION;  Surgeon: Newt Minion, MD;  Location: [place];  Service: Orthopedics;  Laterality: Right;  . CESAREAN SECTION    . CHOLECYSTECTOMY    . CYST EXCISION    . TONSILLECTOMY    . TOOTH EXTRACTION       reports that she has never smoked. She has never used smokeless tobacco. She reports that she does not drink alcohol or use drugs.  Allergies  Allergen Reactions  . Morphine And Related Nausea And Vomiting    Family History  Problem Relation Age of Onset  . Diabetes Mother   . Cancer Mother   . Irritable bowel syndrome Mother   . Hypertension Mother   . Sleep apnea Mother   . Diabetes Father   . Restless legs syndrome Father      Prior to Admission medications  Medication Sig Start Date End Date Taking? Authorizing Provider  albuterol (PROVENTIL HFA;VENTOLIN HFA) 108 (90 Base) MCG/ACT inhaler Inhale 2 puffs into the lungs every 6 (six) hours as needed for wheezing or shortness of breath.    [provider]  blood glucose meter kit and supplies Dispense based on patient and insurance preference. Use up to four times daily as directed. (FOR ICD-9 250.00, 250.01). 10/25/16   Patrecia Pour, Christean Grief, MD  docusate sodium (COLACE) 100 MG capsule Take 1 capsule (100 mg total) by mouth 2 (two) times daily. For constipation Patient taking differently: Take 100 mg by mouth 2 (two) times daily as needed. For constipation 11/02/17   Rai, Vernelle Emerald, MD  famotidine (PEPCID) 20 MG tablet Take 1 tablet (20 mg total) by mouth 2 (two) times daily. Patient not taking: Reported on 07/20/2018 10/25/16   Patrecia Pour, Christean Grief, MD  gabapentin (NEURONTIN) 300 MG capsule Take 600 mg by mouth 2 (two) times daily.    [provider]  ibuprofen (ADVIL,MOTRIN) 200 MG tablet Take 600 mg by mouth every 6 (six) hours as needed for headache or moderate pain.    [provider]  insulin aspart (NOVOLOG) 100 UNIT/ML injection Inject 35 Units into the skin 3 (three) times daily with meals. Patient taking differently: Inject 25-50 Units into the skin See admin instructions. 3-4 times daily.  "Sliding scale plus 20 units." 11/02/17   Rai, Ripudeep K, MD  Insulin Degludec (TRESIBA FLEXTOUCH) 200 UNIT/ML SOPN Inject 100 Units into the skin every morning.    [provider]  lisinopril (PRINIVIL,ZESTRIL) 10 MG tablet Take 10 mg by mouth daily. 07/09/18   [provider]  loratadine (CLARITIN) 10 MG tablet Take 1 tablet (10 mg total) by mouth daily. 11/02/17   Rai, Vernelle Emerald, MD  metFORMIN (GLUCOPHAGE) 500 MG tablet Take 1 tablet (500 mg total) by mouth 2 (two) times daily with a meal. 11/02/17   Rai, Ripudeep K, MD  mometasone-formoterol (DULERA) 200-5 MCG/ACT AERO Inhale 2 puffs into the lungs 2 (two) times daily. Patient not taking: Reported on 07/20/2018 11/02/17   Mendel Corning, MD    Physical Exam: Vitals:   09/06/18 0300 09/06/18 0302 09/06/18 0330 09/06/18 0453  BP: 132/86   (!) 145/91  Pulse: (!) 111  (!) 106 (!) 104  Resp: _0 Temp:  98.8 F (37.1 C)  98.4 F (36.9 C)  TempSrc:  Oral  Oral  SpO2: 94%  95% 99%  Weight:      Height:        Constitutional:  NAD, calm  Eyes: PERTLA, lids and conjunctivae normal ENMT: Mucous membranes are moist. Posterior pharynx clear of any exudate or lesions.   Neck: normal, supple, no masses, no thyromegaly Respiratory: clear to auscultation bilaterally, no wheezing, no crackles. Normal respiratory effort.    Cardiovascular: Rate ~110 and regular. No extremity edema.   Abdomen: No distension, soft, tender in left flank. Bowel sounds active.  Musculoskeletal: no clubbing / cyanosis. Status-post partial amputations involving right foot.     Skin: no significant rashes, lesions, ulcers. Warm, dry, well-perfused. Neurologic: CN 2-12 grossly intact. Strength 5/5 in all 4 limbs.  Psychiatric: Alert and oriented x 3. Calm, cooperative.    Labs on Admission: I have personally reviewed following labs and imaging studies  CBC: Recent Labs  Lab 09/06/18 0113  WBC 12.9*  NEUTROABS 9.3*  HGB 14.9  HCT 43.9  MCV 82.1  PLT 399  Basic Metabolic Panel: Recent Labs  Lab 09/06/18 0113  NA 130*  K 5.0  CL 95*  CO2 23  GLUCOSE 409*  BUN 36*  CREATININE 1.56*  CALCIUM 9.8   GFR: Estimated Creatinine Clearance: 64.2 mL/min (A) (by C-G formula based on SCr of 1.56 mg/dL (H)). Liver Function Tests: Recent Labs  Lab 09/06/18 0113  AST 19  ALT 22  ALKPHOS 95  BILITOT 0.6  PROT 8.7*  ALBUMIN 3.8   No results for input(s): LIPASE, AMYLASE in the last 168 hours. No results for input(s): AMMONIA in the last 168 hours. Coagulation Profile: No results for input(s): INR, PROTIME in the last 168 hours. Cardiac Enzymes: Recent Labs  Lab 09/06/18 0113  TROPONINI <0.03   BNP (last 3 results) No results for input(s): PROBNP in the last 8760 hours. HbA1C: No results for input(s): HGBA1C in the last 72 hours. CBG: Recent Labs  Lab 09/06/18 0057 09/06/18 0308 09/06/18 0408  GLUCAP 432* 396* 426*   Lipid Profile: No results for input(s): CHOL, HDL, LDLCALC, TRIG, CHOLHDL, LDLDIRECT in the last 72  hours. Thyroid Function Tests: No results for input(s): TSH, T4TOTAL, FREET4, T3FREE, THYROIDAB in the last 72 hours. Anemia Panel: No results for input(s): VITAMINB12, FOLATE, FERRITIN, TIBC, IRON, RETICCTPCT in the last 72 hours. Urine analysis:    Component Value Date/Time   COLORURINE YELLOW 09/06/2018 0202   APPEARANCEUR CLOUDY (A) 09/06/2018 0202   LABSPEC >1.030 (H) 09/06/2018 0202   PHURINE 5.0 09/06/2018 0202   GLUCOSEU >=500 (A) 09/06/2018 0202   HGBUR MODERATE (A) 09/06/2018 0202   BILIRUBINUR NEGATIVE 09/06/2018 0202   KETONESUR NEGATIVE 09/06/2018 0202   PROTEINUR 100 (A) 09/06/2018 0202   UROBILINOGEN 0.2 07/11/2014 1555   NITRITE NEGATIVE 09/06/2018 0202   LEUKOCYTESUR NEGATIVE 09/06/2018 0202   Sepsis Labs: _0 (procalcitonin:4,lacticidven:4) )No results found for this or any previous visit (from the past 240 hour(s)).   Radiological Exams on Admission: Ct Renal Stone Study  Result Date: 09/06/2018 CLINICAL DATA:  Left-sided abdominal pain for 2 days. Nausea, vomiting. EXAM: CT ABDOMEN AND PELVIS WITHOUT CONTRAST TECHNIQUE: Multidetector CT imaging of the abdomen and pelvis was performed following the standard protocol without IV contrast. COMPARISON:  08/11/2017 FINDINGS: Lower chest: Lung bases are clear. Hepatobiliary: No focal liver abnormality is seen. Status post cholecystectomy. No biliary dilatation. Pancreas: Unremarkable. No pancreatic ductal dilatation or surrounding inflammatory changes. Spleen: Normal in size without focal abnormality. Adrenals/Urinary Tract: No adrenal gland nodules. There is diffuse infiltration in the fat around the left kidney with enlargement of the left kidney in comparison to the right. Mild prominence of adjacent lymph nodes, likely reactive. No hydronephrosis or hydroureter. No stones identified. Bladder wall is not thickened and no bladder stones. Right kidney in ureter are unremarkable. Changes most likely to represent  inflammatory process such as pyelonephritis. Sequela of a recently passed stone could also have this appearance but less likely due to lack of hydronephrosis. Stomach/Bowel: Stomach, small bowel, and colon are mostly decompressed. No inflammatory changes identified with respect to the colon. Appendix is not identified. Vascular/Lymphatic: No significant vascular findings are present. No enlarged abdominal or pelvic lymph nodes. Reproductive: Uterus is somewhat nodular, possibly representing a uterine fibroid. Ovaries are not enlarged. Other: No free air or free fluid in the abdomen. Abdominal wall musculature appears intact. Musculoskeletal: No acute or significant osseous findings. IMPRESSION: Diffuse infiltration in the fat around the left kidney with enlargement of the left kidney in comparison to the right. No hydronephrosis  or hydroureter. Mild prominence of lymph nodes, likely reactive. Changes likely to represent inflammatory process such as pyelonephritis or, less likely, recently passed stone. Electronically Signed   By: Lucienne Capers M.D.   On: 09/06/2018 02:54    EKG: Independently reviewed. Sinus tachycardia (rate 115).   Assessment/Plan   1. Sepsis secondary to pyelonephritis on left  - Presents with 2 days of worsening left flank pain, dysuria, chills, N/V, and lightheadedness - She is afebrile with tachycardia, leukocytosis, AKI, and CT-findings suggestive of pyelonephritis on left  - Blood and urine cultures were collected in ED, 2 liters NS given, and she was treated with empiric Rocephin  - Given recent IV antibiotic use, will continue empiric treatment with cefepime for now, continue supportive care with analgesics and antiemetics, follow cultures and clinical course   2. AKI  - SCr is 1.56 on admission, up from baseline of 0.8  - Likely a prerenal azotemia in setting of sepsis and N/V  - No hydronephrosis or hydroureter on CT  - She was fluid-resuscitated in ED   - Hold  lisinopril, continue IVF hydration, and repeat chem panel   3. Uncontrolled insulin-dependent DM  - A1c was 13.0% last year  - Managed at home with metformin, Tresiba, and Novolog  - Check CBG's, use Lantus and Novolog while in hospital, start with dose-reduction to avoid hypoglycemia, adjust as needed    4. Hypertension  - BP at goal  - Hold lisinopril until renal function stabilizes    5. Asthma  - Reports mild cough recently, but denies SOB and no wheezing appreciated  - Continue as-needed albuterol    6. Hyponatremia  - Serum glucose in 130 on admission in setting of hypovolemia and marked hyperglycemia  - Anticipate improvement with glycemic-control and IVF hydration  - She was given 2 liters NS in ED, has been continued on NS infusion, and chem panel will be repeated in am     DVT prophylaxis: Lovenox Code Status: Full  Family Communication: Discussed with patient  Consults called: None Admission status: Observation     Vianne Bulls, MD [facility] Pager [phone]  If 7PM-7AM, please contact night-coverage [email or web address] Password [email or web address]  09/06/2018, 5:03 AM

## 2018-09-06 NOTE — Progress Notes (Signed)
Pharmacy Antibiotic Note  Lindsay Love is a 37 y.o. female admitted on 09/06/2018 with sepsis and UTI.  Pharmacy has been consulted for Cefepime dosing.  Plan: Cefepime 2gm iv x1, then 2gm iv q12hr  Height: 5\' 7"  (170.2 cm) Weight: 250 lb (113.4 kg) IBW/kg (Calculated) : 61.6  Temp (24hrs), Avg:98.4 F (36.9 C), Min:98.1 F (36.7 C), Max:98.8 F (37.1 C)  Recent Labs  Lab 09/06/18 0113 09/06/18 0620  WBC 12.9* 10.7*  CREATININE 1.56* 1.61*  LATICACIDVEN  --  1.6    Estimated Creatinine Clearance: 62.2 mL/min (A) (by C-G formula based on SCr of 1.61 mg/dL (H)).    Allergies  Allergen Reactions  . Morphine And Related Nausea And Vomiting    Antimicrobials this admission:  10/12 CTX x1 10/12 Cefepime >>  Dose adjustments this admission:  Microbiology results: 10/12 BCx: sent 10/12 UCx: sent  10/12 Urine Preg: neg 101/2 UDS: neg  Thank you for allowing pharmacy to be a part of this patient's care.  Otho Bellows 09/06/2018 12:16 PM

## 2018-09-06 NOTE — Progress Notes (Signed)
Lindsay Love is a 37 y.o. female with medical history significant for poorly controlled insulin-dependent diabetes mellitus with history of osteomyelitis involving the right foot, hypertension, and asthma, now presenting to the emergency department with 2 days of pain in the abdomen and left flank, chills, dysuria, nausea, and nonbloody vomiting.  patient will be observed at [facility] long for ongoing evaluation and management of sepsis secondary to pyelonephritis.   Continue with IV antibiotics and IV fluids.   Kathlen Mody, MD [phone]

## 2018-09-07 DIAGNOSIS — M869 Osteomyelitis, unspecified: Secondary | ICD-10-CM | POA: Diagnosis present

## 2018-09-07 DIAGNOSIS — Z79899 Other long term (current) drug therapy: Secondary | ICD-10-CM | POA: Diagnosis not present

## 2018-09-07 DIAGNOSIS — E861 Hypovolemia: Secondary | ICD-10-CM | POA: Diagnosis present

## 2018-09-07 DIAGNOSIS — K589 Irritable bowel syndrome without diarrhea: Secondary | ICD-10-CM | POA: Diagnosis present

## 2018-09-07 DIAGNOSIS — E1165 Type 2 diabetes mellitus with hyperglycemia: Secondary | ICD-10-CM | POA: Diagnosis present

## 2018-09-07 DIAGNOSIS — G2581 Restless legs syndrome: Secondary | ICD-10-CM | POA: Diagnosis present

## 2018-09-07 DIAGNOSIS — N10 Acute tubulo-interstitial nephritis: Secondary | ICD-10-CM | POA: Diagnosis present

## 2018-09-07 DIAGNOSIS — E860 Dehydration: Secondary | ICD-10-CM

## 2018-09-07 DIAGNOSIS — Z791 Long term (current) use of non-steroidal anti-inflammatories (NSAID): Secondary | ICD-10-CM | POA: Diagnosis not present

## 2018-09-07 DIAGNOSIS — N179 Acute kidney failure, unspecified: Secondary | ICD-10-CM | POA: Diagnosis not present

## 2018-09-07 DIAGNOSIS — Z8249 Family history of ischemic heart disease and other diseases of the circulatory system: Secondary | ICD-10-CM | POA: Diagnosis not present

## 2018-09-07 DIAGNOSIS — N12 Tubulo-interstitial nephritis, not specified as acute or chronic: Secondary | ICD-10-CM | POA: Diagnosis not present

## 2018-09-07 DIAGNOSIS — J45909 Unspecified asthma, uncomplicated: Secondary | ICD-10-CM | POA: Diagnosis present

## 2018-09-07 DIAGNOSIS — R109 Unspecified abdominal pain: Secondary | ICD-10-CM | POA: Diagnosis not present

## 2018-09-07 DIAGNOSIS — K219 Gastro-esophageal reflux disease without esophagitis: Secondary | ICD-10-CM | POA: Diagnosis present

## 2018-09-07 DIAGNOSIS — Z9049 Acquired absence of other specified parts of digestive tract: Secondary | ICD-10-CM | POA: Diagnosis not present

## 2018-09-07 DIAGNOSIS — E871 Hypo-osmolality and hyponatremia: Secondary | ICD-10-CM | POA: Diagnosis present

## 2018-09-07 DIAGNOSIS — Z794 Long term (current) use of insulin: Secondary | ICD-10-CM | POA: Diagnosis not present

## 2018-09-07 DIAGNOSIS — D638 Anemia in other chronic diseases classified elsewhere: Secondary | ICD-10-CM | POA: Diagnosis present

## 2018-09-07 DIAGNOSIS — Z833 Family history of diabetes mellitus: Secondary | ICD-10-CM | POA: Diagnosis not present

## 2018-09-07 DIAGNOSIS — Z885 Allergy status to narcotic agent status: Secondary | ICD-10-CM | POA: Diagnosis not present

## 2018-09-07 DIAGNOSIS — I10 Essential (primary) hypertension: Secondary | ICD-10-CM | POA: Diagnosis present

## 2018-09-07 DIAGNOSIS — A419 Sepsis, unspecified organism: Secondary | ICD-10-CM | POA: Diagnosis present

## 2018-09-07 DIAGNOSIS — J4530 Mild persistent asthma, uncomplicated: Secondary | ICD-10-CM | POA: Diagnosis not present

## 2018-09-07 DIAGNOSIS — Z89421 Acquired absence of other right toe(s): Secondary | ICD-10-CM | POA: Diagnosis not present

## 2018-09-07 LAB — URINE CULTURE
Culture: <10000 — AB
SPECIAL REQUESTS: NORMAL

## 2018-09-07 LAB — BASIC METABOLIC PANEL
ANION GAP: 8 (ref 5–15)
BUN: 24 mg/dL — ABNORMAL HIGH (ref 6–20)
CHLORIDE: 103 mmol/L (ref 98–111)
CO2: 24 mmol/L (ref 22–32)
Calcium: 8.4 mg/dL — ABNORMAL LOW (ref 8.9–10.3)
Creatinine, Ser: 1.1 mg/dL — ABNORMAL HIGH (ref 0.44–1.00)
GFR calc Af Amer: >60 mL/min (ref >60)
GFR calc non Af Amer: >60 mL/min (ref >60)
Glucose, Bld: 294 mg/dL — ABNORMAL HIGH (ref 70–99)
POTASSIUM: 4.5 mmol/L (ref 3.5–5.1)
SODIUM: 135 mmol/L (ref 135–145)

## 2018-09-07 LAB — GLUCOSE, CAPILLARY
GLUCOSE-CAPILLARY: 228 mg/dL — AB (ref 70–99)
Glucose-Capillary: 272 mg/dL — ABNORMAL HIGH (ref 70–99)
Glucose-Capillary: 274 mg/dL — ABNORMAL HIGH (ref 70–99)
Glucose-Capillary: 159 mg/dL — ABNORMAL HIGH (ref 70–99)

## 2018-09-07 MED ORDER — TRAZODONE HCL 50 MG PO TABS
50.0000 mg | ORAL_TABLET | Freq: Once | ORAL | Status: AC
  Administered 2018-09-07: 50 mg via ORAL
  Filled 2018-09-07: qty 1

## 2018-09-07 MED ORDER — INSULIN GLARGINE 100 UNIT/ML ~~LOC~~ SOLN
60.0000 [IU] | Freq: Two times a day (BID) | SUBCUTANEOUS | Status: DC
  Administered 2018-09-07 – 2018-09-08 (×3): 60 [IU] via SUBCUTANEOUS
  Filled 2018-09-07 (×4): qty 0.6

## 2018-09-07 MED ORDER — INSULIN GLARGINE 100 UNIT/ML ~~LOC~~ SOLN: 100.0000 [IU] | Freq: Every day | SUBCUTANEOUS | Status: DC

## 2018-09-07 MED ORDER — INSULIN ASPART 100 UNIT/ML ~~LOC~~ SOLN
0.0000 [IU] | Freq: Three times a day (TID) | SUBCUTANEOUS | Status: DC
  Administered 2018-09-07: 11 [IU] via SUBCUTANEOUS
  Administered 2018-09-07: 4 [IU] via SUBCUTANEOUS
  Administered 2018-09-08 (×3): 7 [IU] via SUBCUTANEOUS
  Administered 2018-09-09: 4 [IU] via SUBCUTANEOUS

## 2018-09-07 MED ORDER — INSULIN ASPART 100 UNIT/ML ~~LOC~~ SOLN
5.0000 [IU] | Freq: Three times a day (TID) | SUBCUTANEOUS | Status: DC
  Administered 2018-09-07 – 2018-09-08 (×3): 5 [IU] via SUBCUTANEOUS

## 2018-09-07 MED ORDER — POLYETHYLENE GLYCOL 3350 17 G PO PACK
17.0000 g | PACK | Freq: Every day | ORAL | Status: DC
  Administered 2018-09-08 – 2018-09-09 (×2): 17 g via ORAL
  Filled 2018-09-07 (×3): qty 1

## 2018-09-07 MED ORDER — SENNOSIDES-DOCUSATE SODIUM 8.6-50 MG PO TABS
2.0000 | ORAL_TABLET | Freq: Two times a day (BID) | ORAL | Status: DC
  Administered 2018-09-07 – 2018-09-09 (×4): 2 via ORAL
  Filled 2018-09-07 (×5): qty 2

## 2018-09-07 NOTE — Progress Notes (Signed)
PROGRESS NOTE    Lindsay Love  ZOX:[id number] DOB: 1981/03/14 DOA: 09/06/2018 PCP: Elson Clan, FNP   Brief Narrative: Lindsay Love a 37 y.o.femalewith medical history significant forpoorly controlled insulin-dependent diabetes mellitus with history of osteomyelitis involving the right foot, hypertension, and asthma, now presenting to the emergency department with 2 days of pain in the abdomen and left flank, chills, dysuria, nausea, and nonbloody vomiting.  patient will be observed at [facility] long for ongoing evaluation and management of sepsis secondary to pyelonephritis. Assessment & Plan:   Principal Problem:   Sepsis secondary to UTI [facility]) Active Problems:   Uncontrolled type II diabetes mellitus   Asthma   Hyponatremia   AKI (acute kidney injury) (HCC)   Pyelonephritis   Acute pyelonephritis:  Admitted for IV antibiotics and IV fluids.  Urine cultures pending, blood cultures are also pending.  Afebrile and wbc count improving.  Lactic acid normalized.  Resume IV antibiotics for another 24 hours and transition to oral ondischarge.     AKI:  SUSPECT from pyelonephritis and dehydration.  Improving with fluids.  Recheck renal parameters in am.    Uncontrolled DM:  CBG (last 3)  Recent Labs    09/07/18 0741 09/07/18 1137 09/07/18 1622  GLUCAP 272* 274* 159*   As per the patient, she takes 60 units BID lantus, and 25 units novolog TIDAC.  Restarted her home dose of lantus and novolog.     Hypertension:  Well controlled.  Holding lisinopril till creatinine stabilizes.    Asthma No wheezing heard.    Hyponatremia: resolved.       DVT prophylaxis: lovenox.  Code Status: full code.  Family Communication: none at bedside.  Disposition Plan: possible d/c in the next 24 hours.    Consultants:   None.    Procedures: none.    Antimicrobials: cefepime.    Subjective: Persistent back pain. Nausea improved. No vomiting  today.  Little bit dizzy.   Objective: Vitals:   09/06/18 1400 09/06/18 2034 09/07/18 0501 09/07/18 1346  BP: (!) 150/92 (!) 152/91 (!) 146/77 (!) 141/80  Pulse: (!) 101 100 93 (!) 102  Resp: 18 17 18 18   Temp: 98.8 F (37.1 C) 98.4 F (36.9 C) 98.2 F (36.8 C) 98.8 F (37.1 C)  TempSrc:  Oral Oral   SpO2: 99% 100% 100% 96%  Weight:      Height:        Intake/Output Summary (Last 24 hours) at 09/07/2018 1707 Last data filed at 09/07/2018 1445 Gross per 24 hour  Intake 1695.31 ml  Output -  Net 1695.31 ml   Filed Weights   09/06/18 0054  Weight: 113.4 kg    Examination:  General exam: Appears calm and comfortable  Respiratory system: Clear to auscultation. Respiratory effort normal. Cardiovascular system: S1 & S2 heard, RRR.   No pedal edema. Gastrointestinal system: Abdomen is nondistended, soft and nontender.  CVA TENDER NESS bilateral.  Central nervous system: Alert and oriented. No focal neurological deficits. Extremities: Symmetric 5 x 5 power. Skin: No rashes, lesions or ulcers Psychiatry:Mood & affect appropriate.     Data Reviewed: I have personally reviewed following labs and imaging studies  CBC: Recent Labs  Lab 09/06/18 0113 09/06/18 0620  WBC 12.9* 10.7*  NEUTROABS 9.3* 8.0*  HGB 14.9 12.7  HCT 43.9 38.5  MCV 82.1 85.2  PLT 399 315   Basic Metabolic Panel: Recent Labs  Lab 09/06/18 0113 09/06/18 0620 09/07/18 1022  NA 130* 131* 135  K  5.0 5.6* 4.5  CL 95* 100 103  CO2 23 22 24   GLUCOSE 409* 430* 294*  BUN 36* 36* 24*  CREATININE 1.56* 1.61* 1.10*  CALCIUM 9.8 8.7* 8.4*   GFR: Estimated Creatinine Clearance: 91 mL/min (A) (by C-G formula based on SCr of 1.1 mg/dL (H)). Liver Function Tests: Recent Labs  Lab 09/06/18 0113  AST 19  ALT 22  ALKPHOS 95  BILITOT 0.6  PROT 8.7*  ALBUMIN 3.8   No results for input(s): LIPASE, AMYLASE in the last 168 hours. No results for input(s): AMMONIA in the last 168 hours. Coagulation  Profile: No results for input(s): INR, PROTIME in the last 168 hours. Cardiac Enzymes: Recent Labs  Lab 09/06/18 0113  TROPONINI <0.03   BNP (last 3 results) No results for input(s): PROBNP in the last 8760 hours. HbA1C: No results for input(s): HGBA1C in the last 72 hours. CBG: Recent Labs  Lab 09/06/18 1746 09/06/18 2036 09/07/18 0741 09/07/18 1137 09/07/18 1622  GLUCAP 322* 356* 272* 274* 159*   Lipid Profile: No results for input(s): CHOL, HDL, LDLCALC, TRIG, CHOLHDL, LDLDIRECT in the last 72 hours. Thyroid Function Tests: No results for input(s): TSH, T4TOTAL, FREET4, T3FREE, THYROIDAB in the last 72 hours. Anemia Panel: No results for input(s): VITAMINB12, FOLATE, FERRITIN, TIBC, IRON, RETICCTPCT in the last 72 hours. Sepsis Labs: Recent Labs  Lab 09/06/18 0620  LATICACIDVEN 1.6    Recent Results (from the past 240 hour(s))  Urine culture     Status: Abnormal   Collection Time: 09/06/18  2:02 AM  Result Value Ref Range Status   Specimen Description   Final    URINE, CLEAN CATCH Performed at [facility], [address] [place], [place] [address]    Special Requests   Final    Normal Performed at [facility], [address] [place], [place] [address]    Culture (A)  Final    <10,000 COLONIES/mL INSIGNIFICANT GROWTH Performed at [facility], [address] [place], [place] [address]    Report Status 09/07/2018 FINAL  Final  Blood culture (routine x 2)     Status: None (Preliminary result)   Collection Time: 09/06/18  3:20 AM  Result Value Ref Range Status   Specimen Description   Final    BLOOD RIGHT FOREARM Performed at [facility], [address] [place], [place] [address]    Special Requests   Final    BOTTLES DRAWN AEROBIC ONLY Blood Culture results may not be optimal due to an inadequate volume of blood received in culture bottles Performed at [facility], [address] [place],  [place] [address]    Culture   Final    NO GROWTH < 24 HOURS Performed at [facility], [address] [place], [place] [address]    Report Status PENDING  Incomplete  Blood culture (routine x 2)     Status: None (Preliminary result)   Collection Time: 09/06/18  3:32 AM  Result Value Ref Range Status   Specimen Description   Final    BLOOD LEFT ANTECUBITAL Performed at [facility], [address] [place], [place] [address]    Special Requests   Final    BOTTLES DRAWN AEROBIC AND ANAEROBIC Blood Culture adequate volume Performed at [facility], [address], [place], [place] [address]    Culture   Final  NO GROWTH < 24 HOURS Performed at [facility], [address], [place], [place] [address]    Report Status PENDING  Incomplete         Radiology Studies: Ct Renal Stone Study  Result Date: 09/06/2018 CLINICAL DATA:  Left-sided abdominal pain for 2 days. Nausea, vomiting. EXAM: CT ABDOMEN AND PELVIS WITHOUT CONTRAST TECHNIQUE: Multidetector CT imaging of the abdomen and pelvis was performed following the standard protocol without IV contrast. COMPARISON:  08/11/2017 FINDINGS: Lower chest: Lung bases are clear. Hepatobiliary: No focal liver abnormality is seen. Status post cholecystectomy. No biliary dilatation. Pancreas: Unremarkable. No pancreatic ductal dilatation or surrounding inflammatory changes. Spleen: Normal in size without focal abnormality. Adrenals/Urinary Tract: No adrenal gland nodules. There is diffuse infiltration in the fat around the left kidney with enlargement of the left kidney in comparison to the right. Mild prominence of adjacent lymph nodes, likely reactive. No hydronephrosis or hydroureter. No stones identified. Bladder wall is not thickened and no bladder stones. Right kidney in ureter are unremarkable. Changes most likely to represent inflammatory process such as pyelonephritis. Sequela of a recently passed stone could also  have this appearance but less likely due to lack of hydronephrosis. Stomach/Bowel: Stomach, small bowel, and colon are mostly decompressed. No inflammatory changes identified with respect to the colon. Appendix is not identified. Vascular/Lymphatic: No significant vascular findings are present. No enlarged abdominal or pelvic lymph nodes. Reproductive: Uterus is somewhat nodular, possibly representing a uterine fibroid. Ovaries are not enlarged. Other: No free air or free fluid in the abdomen. Abdominal wall musculature appears intact. Musculoskeletal: No acute or significant osseous findings. IMPRESSION: Diffuse infiltration in the fat around the left kidney with enlargement of the left kidney in comparison to the right. No hydronephrosis or hydroureter. Mild prominence of lymph nodes, likely reactive. Changes likely to represent inflammatory process such as pyelonephritis or, less likely, recently passed stone. Electronically Signed   By: Burman Nieves M.D.   On: 09/06/2018 02:54        Scheduled Meds: . enoxaparin (LOVENOX) injection  40 mg Subcutaneous Q24H  . famotidine  20 mg Oral BID  . gabapentin  600 mg Oral BID  . insulin aspart  0-20 Units Subcutaneous TID WC  . insulin aspart  0-5 Units Subcutaneous QHS  . insulin aspart  5 Units Subcutaneous TID WC  . insulin glargine  60 Units Subcutaneous BID  . polyethylene glycol  17 g Oral Daily  . senna-docusate  2 tablet Oral BID  . sodium chloride flush  3 mL Intravenous Q12H   Continuous Infusions: . sodium chloride 75 mL/hr at 09/07/18 1445  . ceFEPime (MAXIPIME) IV Stopped (09/07/18 0653)     LOS: 0 days    Time spent: 35 minutes.     Kathlen Mody, MD [facility] Pager [phone]  If 7PM-7AM, please contact night-coverage [email or web address] Password [email or web address] 09/07/2018, 5:07 PM

## 2018-09-08 ENCOUNTER — Inpatient Hospital Stay (HOSPITAL_COMMUNITY): Payer: Medicaid Other

## 2018-09-08 LAB — CBC
HEMATOCRIT: 31.1 % — AB (ref 36.0–46.0)
Hemoglobin: 10.3 g/dL — ABNORMAL LOW (ref 12.0–15.0)
MCH: 28.1 pg (ref 26.0–34.0)
MCHC: 33.1 g/dL (ref 30.0–36.0)
MCV: 85 fL (ref 80.0–100.0)
NRBC: 0 % (ref 0.0–0.2)
Platelets: 267 10*3/uL (ref 150–400)
RBC: 3.66 MIL/uL — ABNORMAL LOW (ref 3.87–5.11)
RDW: 13.6 % (ref 11.5–15.5)
WBC: 6.4 10*3/uL (ref 4.0–10.5)

## 2018-09-08 LAB — GLUCOSE, CAPILLARY
GLUCOSE-CAPILLARY: 198 mg/dL — AB (ref 70–99)
Glucose-Capillary: 234 mg/dL — ABNORMAL HIGH (ref 70–99)
Glucose-Capillary: 207 mg/dL — ABNORMAL HIGH (ref 70–99)
Glucose-Capillary: 210 mg/dL — ABNORMAL HIGH (ref 70–99)

## 2018-09-08 LAB — BASIC METABOLIC PANEL
ANION GAP: 10 (ref 5–15)
BUN: 22 mg/dL — ABNORMAL HIGH (ref 6–20)
CALCIUM: 8.3 mg/dL — AB (ref 8.9–10.3)
CO2: 21 mmol/L — AB (ref 22–32)
Chloride: 101 mmol/L (ref 98–111)
Creatinine, Ser: 0.95 mg/dL (ref 0.44–1.00)
GFR calc Af Amer: >60 mL/min (ref >60)
GFR calc non Af Amer: >60 mL/min (ref >60)
Glucose, Bld: 272 mg/dL — ABNORMAL HIGH (ref 70–99)
Potassium: 4.2 mmol/L (ref 3.5–5.1)
Sodium: 132 mmol/L — ABNORMAL LOW (ref 135–145)

## 2018-09-08 MED ORDER — ZOLPIDEM TARTRATE 5 MG PO TABS
5.0000 mg | ORAL_TABLET | Freq: Once | ORAL | Status: AC
  Administered 2018-09-08: 5 mg via ORAL
  Filled 2018-09-08: qty 1

## 2018-09-08 MED ORDER — INSULIN ASPART 100 UNIT/ML ~~LOC~~ SOLN
10.0000 [IU] | Freq: Three times a day (TID) | SUBCUTANEOUS | Status: DC
  Administered 2018-09-08 – 2018-09-09 (×2): 10 [IU] via SUBCUTANEOUS

## 2018-09-08 MED ORDER — METOCLOPRAMIDE HCL 5 MG/ML IJ SOLN
5.0000 mg | Freq: Four times a day (QID) | INTRAMUSCULAR | Status: DC
  Administered 2018-09-08 – 2018-09-09 (×4): 5 mg via INTRAVENOUS
  Filled 2018-09-08 (×4): qty 2

## 2018-09-08 MED ORDER — PANTOPRAZOLE SODIUM 40 MG PO TBEC
40.0000 mg | DELAYED_RELEASE_TABLET | Freq: Every day | ORAL | Status: DC
  Administered 2018-09-08 – 2018-09-09 (×2): 40 mg via ORAL
  Filled 2018-09-08 (×2): qty 1

## 2018-09-08 MED ORDER — INSULIN GLARGINE 100 UNIT/ML ~~LOC~~ SOLN
65.0000 [IU] | Freq: Two times a day (BID) | SUBCUTANEOUS | Status: DC
  Administered 2018-09-08 – 2018-09-09 (×2): 65 [IU] via SUBCUTANEOUS
  Filled 2018-09-08 (×2): qty 0.65

## 2018-09-08 MED FILL — Insulin Regular (Human) Inj 100 Unit/ML: INTRAMUSCULAR | Qty: 0.04 | Status: AC

## 2018-09-08 NOTE — Progress Notes (Addendum)
Inpatient Diabetes Program Recommendations  AACE/ADA: New Consensus Statement on Inpatient Glycemic Control (2015)  Target Ranges:  Prepandial:   less than 140 mg/dL      Peak postprandial:   less than 180 mg/dL (1-2 hours)      Critically ill patients:  140 - 180 mg/dL   Results for Lindsay Love, Lindsay Love (MRN [id number]) as of 09/08/2018 08:33  Ref. Range 09/07/2018 07:41 09/07/2018 11:37 09/07/2018 16:22 09/07/2018 20:40  Glucose-Capillary Latest Ref Range: 70 - 99 mg/dL 409 (H)  8 units NOVOLOG 274 (H)  11 units NOVOLOG +  60 units LANTUS 159 (H)  9 units NOVOLOG 228 (H)  2 units NOVOLOG +  60 units LANTUS    Results for Lindsay Love, Lindsay Love (MRN [id number]) as of 09/08/2018 08:33  Ref. Range 09/08/2018 08:02  Glucose-Capillary Latest Ref Range: 70 - 99 mg/dL 956 (H)  12 units NOVOLOG     Admit with: Sepsis secondary to pyelonephritis  History: DM  Home DM Meds: Tresiba (insulin degludec) 100 units Daily       Novolog 25-50 units 3-4 times per Day per SSI  Current Orders: Lantus 60 units BID      Novolog Resistant Correction Scale/ SSI (0-20 units) TID AC + HS      Novolog 5 units TID with meals     Endocrinologist: Roanna Raider, Althia Forts, MD [facility] [place])--Last seen 07/03/2018.  Most recent A1c on file per Care Everywhere notes was 8.8% back on 07/03/2018.  Per ENDO notes, patient was supposed to start using Insulin Pump therapy this summer, but to date, has not yet started her pump.     MD- Please consider the following in-hospital insulin adjustments:  1. Increase Lantus to 65 units BID (AM CBG still elevated today--234 mg/dl)  2. Increase Novolog Meal Coverage to: Novolog 10 units TID with meals      --Will follow patient during hospitalization--  Ambrose Finland RN, MSN, CDE Diabetes Coordinator Inpatient Glycemic Control Team Team Pager: [phone] (8a-5p)

## 2018-09-08 NOTE — Progress Notes (Signed)
PROGRESS NOTE    Lindsay Love  ZOX:[id number] DOB: [birth year] DOA: 09/06/2018 PCP: Elson Clan, FNP   Brief Narrative: Lindsay Love a 37 y.o.femalewith medical history significant forpoorly controlled insulin-dependent diabetes mellitus with history of osteomyelitis involving the right foot, hypertension, and asthma, now presenting to the emergency department with 2 days of pain in the abdomen and left flank, chills, dysuria, nausea, and nonbloody vomiting.  patient will be observed at [facility] long for ongoing evaluation and management of sepsis secondary to pyelonephritis. Assessment & Plan:   Principal Problem:   Sepsis secondary to UTI [facility]) Active Problems:   Uncontrolled type II diabetes mellitus   Asthma   Hyponatremia   AKI (acute kidney injury) (HCC)   Pyelonephritis   Acute pyelonephritis:  Admitted for IV antibiotics and IV fluids.  Urine cultures gre only 1000 bacteria, blood cultures negative so far.  Afebrile and wbc count improving. Wbc wnl today.  Lactic acid normalized.  Plan to transition to oral antibiotics today, but pt reported worsening pain in the left CVA today.  So monitor her on IV antibiotics for another 24 hours.  Get US renal.  Pain control, if her pain is controlled, plan to transition to oral antibiotics tomorrow and discharge her.     AKI:  SUSPECT from pyelonephritis and dehydration.  Improving with fluids.  Recheck renal parameters show creatinine back to baseline.    Uncontrolled DM:  CBG (last 3)  Recent Labs    09/08/18 0802 09/08/18 1152 09/08/18 1757  GLUCAP 234* 207* 198*   As per the patient, she takes 60 units BID lantus, and 25 units novolog TIDAC.  Increase the lantus to 65 units BID, and increase to 10 units TIDAC. AND SSI.     Hypertension:  Well controlled.  Holding lisinopril till creatinine stabilizes.    Asthma No wheezing heard.    Hyponatremia: resolved.   ANEMIA: normocytic,  drop from a baseline of 12 to 10.  Repeat CBC in am.     DVT prophylaxis: lovenox.  Code Status: full code.  Family Communication: none at bedside.  Disposition Plan: possible d/c in the next 24 hours.    Consultants:   None.    Procedures: none.    Antimicrobials: cefepime.    Subjective: Left sided back pain, worse this am.   Objective: Vitals:   09/07/18 1346 09/07/18 2036 09/08/18 0439 09/08/18 1518  BP: (!) 141/80 (!) 153/92 133/77 (!) 155/84  Pulse: (!) 102 97 (!) 101 88  Resp: 18 (!) 22 18 20   Temp: 98.8 F (37.1 C) 98.5 F (36.9 C) 98.3 F (36.8 C) 98.3 F (36.8 C)  TempSrc:   Oral Oral  SpO2: 96% 100% 94% 94%  Weight:      Height:        Intake/Output Summary (Last 24 hours) at 09/08/2018 1832 Last data filed at 09/08/2018 1646 Gross per 24 hour  Intake 1463.99 ml  Output -  Net 1463.99 ml   Filed Weights   09/06/18 0054  Weight: 113.4 kg    Examination:  General exam: in mild discomfort from left back pain.  Respiratory system: Clear to auscultation. Respiratory effort normal. No wheezing or rhonchi.  Cardiovascular system: S1 & S2 heard, RRR.   No pedal edema. Gastrointestinal system: Abdomen is nondistended, soft and non tender , bowel sounds good.  left CVA tenderness persistent.  Central nervous system: Alert and oriented. Non focal.  Extremities: Symmetric 5 x 5 power. Partial amp of the left  5 th toe.  Skin: No rashes, lesions or ulcers Psychiatry:Mood & affect appropriate.     Data Reviewed: I have personally reviewed following labs and imaging studies  CBC: Recent Labs  Lab 09/06/18 0113 09/06/18 0620 09/08/18 0624  WBC 12.9* 10.7* 6.4  NEUTROABS 9.3* 8.0*  --   HGB 14.9 12.7 10.3*  HCT 43.9 38.5 31.1*  MCV 82.1 85.2 85.0  PLT 399 315 267   Basic Metabolic Panel: Recent Labs  Lab 09/06/18 0113 09/06/18 0620 09/07/18 1022 09/08/18 0624  NA 130* 131* 135 132*  K 5.0 5.6* 4.5 4.2  CL 95* 100 103 101  CO2 23 22  24  21*  GLUCOSE 409* 430* 294* 272*  BUN 36* 36* 24* 22*  CREATININE 1.56* 1.61* 1.10* 0.95  CALCIUM 9.8 8.7* 8.4* 8.3*   GFR: Estimated Creatinine Clearance: 105.3 mL/min (by C-G formula based on SCr of 0.95 mg/dL). Liver Function Tests: Recent Labs  Lab 09/06/18 0113  AST 19  ALT 22  ALKPHOS 95  BILITOT 0.6  PROT 8.7*  ALBUMIN 3.8   No results for input(s): LIPASE, AMYLASE in the last 168 hours. No results for input(s): AMMONIA in the last 168 hours. Coagulation Profile: No results for input(s): INR, PROTIME in the last 168 hours. Cardiac Enzymes: Recent Labs  Lab 09/06/18 0113  TROPONINI <0.03   BNP (last 3 results) No results for input(s): PROBNP in the last 8760 hours. HbA1C: No results for input(s): HGBA1C in the last 72 hours. CBG: Recent Labs  Lab 09/07/18 1622 09/07/18 2040 09/08/18 0802 09/08/18 1152 09/08/18 1757  GLUCAP 159* 228* 234* 207* 198*   Lipid Profile: No results for input(s): CHOL, HDL, LDLCALC, TRIG, CHOLHDL, LDLDIRECT in the last 72 hours. Thyroid Function Tests: No results for input(s): TSH, T4TOTAL, FREET4, T3FREE, THYROIDAB in the last 72 hours. Anemia Panel: No results for input(s): VITAMINB12, FOLATE, FERRITIN, TIBC, IRON, RETICCTPCT in the last 72 hours. Sepsis Labs: Recent Labs  Lab 09/06/18 0620  LATICACIDVEN 1.6    Recent Results (from the past 240 hour(s))  Urine culture     Status: Abnormal   Collection Time: 09/06/18  2:02 AM  Result Value Ref Range Status   Specimen Description   Final    URINE, CLEAN CATCH Performed at [facility], [address] [place], [place] [address]    Special Requests   Final    Normal Performed at [facility], [address] [place], [place] [address]    Culture (A)  Final    <10,000 COLONIES/mL INSIGNIFICANT GROWTH Performed at [facility], [address], [place], [place] [address]    Report Status 09/07/2018 FINAL  Final  Blood culture (routine x  2)     Status: None (Preliminary result)   Collection Time: 09/06/18  3:20 AM  Result Value Ref Range Status   Specimen Description   Final    BLOOD RIGHT FOREARM Performed at [facility], [address] [place], [place] [address]    Special Requests   Final    BOTTLES DRAWN AEROBIC ONLY Blood Culture results may not be optimal due to an inadequate volume of blood received in culture bottles Performed at [facility], [address] [place], [place] [address]    Culture   Final    NO GROWTH < 24 HOURS Performed at [facility], [address], [place], [place] [address]    Report  Status PENDING  Incomplete  Blood culture (routine x 2)     Status: None (Preliminary result)   Collection Time: 09/06/18  3:32 AM  Result Value Ref Range Status   Specimen Description   Final    BLOOD LEFT ANTECUBITAL Performed at [facility], [address] [place], [place] [address]    Special Requests   Final    BOTTLES DRAWN AEROBIC AND ANAEROBIC Blood Culture adequate volume Performed at [facility], [address] [place], [place] [address]    Culture   Final    NO GROWTH < 24 HOURS Performed at [facility], [address] [place], [place] [address]    Report Status PENDING  Incomplete         Radiology Studies: US Renal  Result Date: 09/08/2018 CLINICAL DATA:  Acute onset left flank pain EXAM: RENAL / URINARY TRACT ULTRASOUND COMPLETE COMPARISON:  CT scan of the abdomen and pelvis of September 06, 2018 FINDINGS: Right Kidney: Length: 13.2 cm. Echogenicity within normal limits. No mass or hydronephrosis visualized. Left Kidney: Length: 14 cm. Echogenicity within normal limits. No mass or hydronephrosis visualized. Bladder: Appears normal for degree of bladder distention. IMPRESSION: No hydronephrosis. The perinephric space on the left appears normal today. Normal appearing urinary bladder. Electronically Signed   By: David  [place]  M.D.   On: 09/08/2018 15:40        Scheduled Meds: . enoxaparin (LOVENOX) injection  40 mg Subcutaneous Q24H  . famotidine  20 mg Oral BID  . gabapentin  600 mg Oral BID  . insulin aspart  0-20 Units Subcutaneous TID WC  . insulin aspart  0-5 Units Subcutaneous QHS  . insulin aspart  10 Units Subcutaneous TID WC  . insulin glargine  65 Units Subcutaneous BID  . metoCLOPramide (REGLAN) injection  5 mg Intravenous Q6H  . pantoprazole  40 mg Oral Q0600  . polyethylene glycol  17 g Oral Daily  . senna-docusate  2 tablet Oral BID  . sodium chloride flush  3 mL Intravenous Q12H   Continuous Infusions: . sodium chloride 75 mL/hr at 09/08/18 1457  . ceFEPime (MAXIPIME) IV 2 g (09/08/18 1812)     LOS: 1 day    Time spent: 35 minutes.     Kathlen Mody, MD [facility] Pager [phone]  If 7PM-7AM, please contact night-coverage [email or web address] Password [email or web address] 09/08/2018, 6:32 PM

## 2018-09-09 DIAGNOSIS — R109 Unspecified abdominal pain: Secondary | ICD-10-CM

## 2018-09-09 LAB — GLUCOSE, CAPILLARY: Glucose-Capillary: 188 mg/dL — ABNORMAL HIGH (ref 70–99)

## 2018-09-09 LAB — MRSA PCR SCREENING: MRSA by PCR: NEGATIVE

## 2018-09-09 MED ORDER — FLUCONAZOLE 100 MG PO TABS
100.0000 mg | ORAL_TABLET | Freq: Every day | ORAL | Status: DC
  Filled 2018-09-09: qty 1

## 2018-09-09 MED ORDER — POLYETHYLENE GLYCOL 3350 17 G PO PACK: 17.0000 g | PACK | Freq: Every day | ORAL | 0 refills | Status: DC

## 2018-09-09 MED ORDER — METOCLOPRAMIDE HCL 5 MG PO TABS: 5.0000 mg | ORAL_TABLET | Freq: Four times a day (QID) | ORAL | 0 refills | Status: DC | PRN

## 2018-09-09 MED ORDER — FLUCONAZOLE 100 MG PO TABS: 100.0000 mg | ORAL_TABLET | Freq: Every day | ORAL | 0 refills | Status: DC

## 2018-09-09 MED ORDER — CEPHALEXIN 500 MG PO CAPS: 500.0000 mg | ORAL_CAPSULE | Freq: Two times a day (BID) | ORAL | 0 refills | Status: AC

## 2018-09-09 MED ORDER — PANTOPRAZOLE SODIUM 40 MG PO TBEC: 40.0000 mg | DELAYED_RELEASE_TABLET | Freq: Every day | ORAL | 0 refills | Status: AC

## 2018-09-09 MED ORDER — SENNOSIDES-DOCUSATE SODIUM 8.6-50 MG PO TABS: 2.0000 | ORAL_TABLET | Freq: Two times a day (BID) | ORAL | 0 refills | Status: DC

## 2018-09-09 MED ORDER — METOCLOPRAMIDE HCL 5 MG PO TABS: 5.0000 mg | ORAL_TABLET | Freq: Three times a day (TID) | ORAL | Status: DC

## 2018-09-09 MED ORDER — TRAMADOL HCL 50 MG PO TABS: 50.0000 mg | ORAL_TABLET | Freq: Four times a day (QID) | ORAL | 0 refills | Status: DC | PRN

## 2018-09-09 NOTE — Care Management Note (Signed)
Case Management Note  Patient Details  Name: Ichelle Harral MRN: [id number] Date of Birth: 1981-01-19  Subjective/Objective:                  Discharged to home  Action/Plan: No cm needs present.  Expected Discharge Date:  09/09/18               Expected Discharge Plan:  Home/Self Care  In-House Referral:     Discharge planning Services  CM Consult  Post Acute Care Choice:    Choice offered to:     DME Arranged:    DME Agency:     HH Arranged:    HH Agency:     Status of Service:  Completed, signed off  If discussed at [employer] of Stay Meetings, dates discussed:    Additional Comments:  Golda Acre, RN 09/09/2018, 10:27 AM

## 2018-09-11 LAB — CULTURE, BLOOD (ROUTINE X 2)
CULTURE: NO GROWTH
Culture: NO GROWTH
SPECIAL REQUESTS: ADEQUATE

## 2018-09-16 NOTE — Discharge Summary (Signed)
Physician Discharge Summary  Lindsay Love OEH:[id number] DOB: July 03, 1981 DOA: 09/06/2018  PCP: Lindsay Lacrosse, FNP  Admit date: 09/06/2018 Discharge date: 09/16/2018  Admitted From: Lindsay Love Disposition: Home.  Recommendations for Outpatient Follow-up:  1. Follow up with PCP in 1-2 weeks 2. Please obtain BMP/CBC in one week  Discharge Condition: stable.  CODE STATUS:full code.  Diet recommendation: Heart Healthy    Brief/Interim Summary: Lindsay Love a 37 y.o.femalewith medical history significant forpoorly controlled insulin-dependent diabetes mellitus with history of osteomyelitis involving the right foot, hypertension, and asthma, now presenting to the emergency department with 2 days of pain in the abdomen and left flank, chills, dysuria, nausea, and nonbloody vomiting.  patient will be observed at [facility] long for ongoing evaluation and management of sepsis secondary to pyelonephritis.  Discharge Diagnoses:  Principal Problem:   Sepsis secondary to UTI [facility]) Active Problems:   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 ([place])   Asthma   Hyponatremia   AKI (acute kidney injury) ([place])   Pyelonephritis  Acute pyelonephritis:  Admitted for IV antibiotics and IV fluids.  Urine cultures gre only 1000 bacteria, blood cultures negative so far.  Afebrile and wbc count improving. Wbc wnl today.  Lactic acid normalized.  Pt reports worsened pain, repeat US renal showed improvement in the pyelonephritis. Recommended she get OOB and walk.  Discharged her on oral antibiotics to complete the course.     AKI:  SUSPECT from pyelonephritis and dehydration.  Improving with fluids.  Recheck renal parameters show creatinine back to baseline.    Uncontrolled DM with hyperglycemia: Recommend to go back to her home regimen on discharge.     Hypertension:  Well controlled.     Asthma No wheezing heard.    Hyponatremia: resolved.    Anemia of chronic disease:  Recommend checking cbc as outpatient in 2 weeks.      Discharge Instructions  Discharge Instructions    Diet - low sodium heart healthy   Complete by:  As directed    Discharge instructions   Complete by:  As directed    [place] PCP in one week.     Allergies as of 09/09/2018      Reactions   Morphine And Related Nausea And Vomiting      Medication List    STOP taking these medications   ibuprofen 200 MG tablet Commonly known as:  ADVIL,MOTRIN     TAKE these medications   albuterol 108 (90 Base) MCG/ACT inhaler Commonly known as:  PROVENTIL HFA;VENTOLIN HFA Inhale 2 puffs into the lungs every 6 (six) hours as needed for wheezing or shortness of breath.   blood glucose meter kit and supplies Dispense based on patient and insurance preference. Use up to four times daily as directed. (FOR ICD-9 250.00, 250.01).   fluconazole 100 MG tablet Commonly known as:  DIFLUCAN Take 1 tablet (100 mg total) by mouth daily.   gabapentin 300 MG capsule Commonly known as:  NEURONTIN Take 600 mg by mouth 2 (two) times daily.   insulin aspart 100 UNIT/ML injection Commonly known as:  novoLOG Inject 35 Units into the skin 3 (three) times daily with meals. What changed:    how much to take  when to take this  additional instructions   linaclotide 145 MCG Caps capsule Commonly known as:  LINZESS Take 145 mcg by mouth daily as needed (irritable bowel syndrome).   lisinopril 10 MG tablet Commonly known as:  PRINIVIL,ZESTRIL Take 10 mg by  mouth daily.   loratadine 10 MG tablet Commonly known as:  CLARITIN Take 1 tablet (10 mg total) by mouth daily.   metoCLOPramide 5 MG tablet Commonly known as:  REGLAN Take 1 tablet (5 mg total) by mouth every 6 (six) hours as needed for nausea.   pantoprazole 40 MG tablet Commonly known as:  PROTONIX Take 1 tablet (40 mg total) by mouth daily at 6 (six) AM.   polyethylene glycol  packet Commonly known as:  MIRALAX / GLYCOLAX Take 17 g by mouth daily.   senna-docusate 8.6-50 MG tablet Commonly known as:  Senokot-S Take 2 tablets by mouth 2 (two) times daily.   traMADol 50 MG tablet Commonly known as:  ULTRAM Take 1 tablet (50 mg total) by mouth every 6 (six) hours as needed for moderate pain.   TRESIBA FLEXTOUCH 200 UNIT/ML Sopn Generic drug:  Insulin Degludec Inject 100 Units into the skin every morning.     ASK your doctor about these medications   cephALEXin 500 MG capsule Commonly known as:  KEFLEX Take 1 capsule (500 mg total) by mouth 2 (two) times daily for 5 days. Ask about: Should I take this medication?      Follow-up Information    Lindsay Humble Brennan Bailey, FNP. Schedule an appointment as soon as possible for a visit in 1 week(s).   Specialty:  Internal Medicine Contact information: [address] [place] [phone]          Allergies  Allergen Reactions  . Morphine And Related Nausea And Vomiting    Consultations:  none   Procedures/Studies: US Renal  Result Date: 09/08/2018 CLINICAL DATA:  Acute onset left flank pain EXAM: RENAL / URINARY TRACT ULTRASOUND COMPLETE COMPARISON:  CT scan of the abdomen and pelvis of September 06, 2018 FINDINGS: Right Kidney: Length: 13.2 cm. Echogenicity within normal limits. No mass or hydronephrosis visualized. Left Kidney: Length: 14 cm. Echogenicity within normal limits. No mass or hydronephrosis visualized. Bladder: Appears normal for degree of bladder distention. IMPRESSION: No hydronephrosis. The perinephric space on the left appears normal today. Normal appearing urinary bladder. Electronically Signed   By: Lindsay  Love M.D.   On: 09/08/2018 15:40   Ct Renal Stone Study  Result Date: 09/06/2018 CLINICAL DATA:  Left-sided abdominal pain for 2 days. Nausea, vomiting. EXAM: CT ABDOMEN AND PELVIS WITHOUT CONTRAST TECHNIQUE: Multidetector CT imaging of the  abdomen and pelvis was performed following the standard protocol without IV contrast. COMPARISON:  08/11/2017 FINDINGS: Lower chest: Lung bases are clear. Hepatobiliary: No focal liver abnormality is seen. Status post cholecystectomy. No biliary dilatation. Pancreas: Unremarkable. No pancreatic ductal dilatation or surrounding inflammatory changes. Spleen: Normal in size without focal abnormality. Adrenals/Urinary Tract: No adrenal gland nodules. There is diffuse infiltration in the fat around the left kidney with enlargement of the left kidney in comparison to the right. Mild prominence of adjacent lymph nodes, likely reactive. No hydronephrosis or hydroureter. No stones identified. Bladder wall is not thickened and no bladder stones. Right kidney in ureter are unremarkable. Changes most likely to represent inflammatory process such as pyelonephritis. Sequela of a recently passed stone could also have this appearance but less likely due to lack of hydronephrosis. Stomach/Bowel: Stomach, small bowel, and colon are mostly decompressed. No inflammatory changes identified with respect to the colon. Appendix is not identified. Vascular/Lymphatic: No significant vascular findings are present. No enlarged abdominal or pelvic lymph nodes. Reproductive: Uterus is somewhat nodular, possibly representing a uterine fibroid. Ovaries  are not enlarged. Other: No free air or free fluid in the abdomen. Abdominal wall musculature appears intact. Musculoskeletal: No acute or significant osseous findings. IMPRESSION: Diffuse infiltration in the fat around the left kidney with enlargement of the left kidney in comparison to the right. No hydronephrosis or hydroureter. Mild prominence of lymph nodes, likely reactive. Changes likely to represent inflammatory process such as pyelonephritis or, less likely, recently passed stone. Electronically Signed   By: Lucienne Capers M.D.   On: 09/06/2018 02:54       Subjective: No new  complaints.   Discharge Exam: Vitals:   09/08/18 2055 09/09/18 0546  BP:  138/73  Pulse:  98  Resp: 20 20  Temp:  98.6 F (37 C)  SpO2: 96% 94%   Vitals:   09/08/18 1518 09/08/18 1951 09/08/18 2055 09/09/18 0546  BP: (!) 155/84 (!) 154/80  138/73  Pulse: 88 (!) 106  98  Resp: '20 20 20 20  ' Temp: 98.3 F (36.8 C) 98.6 F (37 C)  98.6 F (37 C)  TempSrc: Oral Oral  Oral  SpO2: 94% 96% 96% 94%  Weight:      Height:        General: Pt is alert, awake, not in acute distress Cardiovascular: RRR, S1/S2 +, no rubs, no gallops Respiratory: CTA bilaterally, no wheezing, no rhonchi Abdominal: Soft, NT, ND, bowel sounds + Extremities: no edema, no cyanosis    The results of significant diagnostics from this hospitalization (including imaging, microbiology, ancillary and laboratory) are listed below for reference.     Microbiology: Recent Results (from the past 240 hour(s))  MRSA PCR Screening     Status: None   Collection Time: 09/09/18  8:30 AM  Result Value Ref Range Status   MRSA by PCR NEGATIVE NEGATIVE Final    Comment:        The GeneXpert MRSA Assay (FDA approved for NASAL specimens only), is one component of a comprehensive MRSA colonization surveillance program. It is not intended to diagnose MRSA infection nor to guide or monitor treatment for MRSA infections. Performed at [facility], [place] [address] [place], [place] [address]      Labs: BNP (last 3 results) No results for input(s): BNP in the last 8760 hours. Basic Metabolic Panel: No results for input(s): NA, K, CL, CO2, GLUCOSE, BUN, CREATININE, CALCIUM, MG, PHOS in the last 168 hours. Liver Function Tests: No results for input(s): AST, ALT, ALKPHOS, BILITOT, PROT, ALBUMIN in the last 168 hours. No results for input(s): LIPASE, AMYLASE in the last 168 hours. No results for input(s): AMMONIA in the last 168 hours. CBC: No results for input(s): WBC, NEUTROABS, HGB, HCT, MCV, PLT  in the last 168 hours. Cardiac Enzymes: No results for input(s): CKTOTAL, CKMB, CKMBINDEX, TROPONINI in the last 168 hours. BNP: Invalid input(s): POCBNP CBG: No results for input(s): GLUCAP in the last 168 hours. D-Dimer No results for input(s): DDIMER in the last 72 hours. Hgb A1c No results for input(s): HGBA1C in the last 72 hours. Lipid Profile No results for input(s): CHOL, HDL, LDLCALC, TRIG, CHOLHDL, LDLDIRECT in the last 72 hours. Thyroid function studies No results for input(s): TSH, T4TOTAL, T3FREE, THYROIDAB in the last 72 hours.  Invalid input(s): FREET3 Anemia work up No results for input(s): VITAMINB12, FOLATE, FERRITIN, TIBC, IRON, RETICCTPCT in the last 72 hours. Urinalysis    Component Value Date/Time   COLORURINE YELLOW 09/06/2018 0202   APPEARANCEUR CLOUDY (A) 09/06/2018 0202   LABSPEC >1.030 (  H) 09/06/2018 0202   PHURINE 5.0 09/06/2018 0202   GLUCOSEU >=500 (A) 09/06/2018 0202   HGBUR MODERATE (A) 09/06/2018 0202   BILIRUBINUR NEGATIVE 09/06/2018 0202   KETONESUR NEGATIVE 09/06/2018 0202   PROTEINUR 100 (A) 09/06/2018 0202   UROBILINOGEN 0.2 07/11/2014 1555   NITRITE NEGATIVE 09/06/2018 0202   LEUKOCYTESUR NEGATIVE 09/06/2018 0202   Sepsis Labs Invalid input(s): PROCALCITONIN,  WBC,  LACTICIDVEN Microbiology Recent Results (from the past 240 hour(s))  MRSA PCR Screening     Status: None   Collection Time: 09/09/18  8:30 AM  Result Value Ref Range Status   MRSA by PCR NEGATIVE NEGATIVE Final    Comment:        The GeneXpert MRSA Assay (FDA approved for NASAL specimens only), is one component of a comprehensive MRSA colonization surveillance program. It is not intended to diagnose MRSA infection nor to guide or monitor treatment for MRSA infections. Performed at [facility], [place] [address], [place], [place] [address]      Time coordinating discharge: 32 minutes  SIGNED:   Hosie Poisson, MD  [facility] 09/16/2018, 11:33 AM Pager   If 7PM-7AM, please contact night-coverage [email or web address] Password [email or web address]

## 2018-10-16 ENCOUNTER — Emergency Department (HOSPITAL_BASED_OUTPATIENT_CLINIC_OR_DEPARTMENT_OTHER)
Admission: EM | Admit: 2018-10-16 | Discharge: 2018-10-16 | Disposition: A | Discharge status: Home / Self Care | Payer: Medicaid Other | Attending: Emergency Medicine | Admitting: Emergency Medicine

## 2018-10-16 ENCOUNTER — Encounter (HOSPITAL_BASED_OUTPATIENT_CLINIC_OR_DEPARTMENT_OTHER): Payer: Self-pay | Admitting: Emergency Medicine

## 2018-10-16 ENCOUNTER — Other Ambulatory Visit: Payer: Self-pay

## 2018-10-16 DIAGNOSIS — R21 Rash and other nonspecific skin eruption: Secondary | ICD-10-CM | POA: Diagnosis not present

## 2018-10-16 DIAGNOSIS — J45909 Unspecified asthma, uncomplicated: Secondary | ICD-10-CM | POA: Insufficient documentation

## 2018-10-16 DIAGNOSIS — E119 Type 2 diabetes mellitus without complications: Secondary | ICD-10-CM | POA: Diagnosis not present

## 2018-10-16 DIAGNOSIS — Z794 Long term (current) use of insulin: Secondary | ICD-10-CM | POA: Insufficient documentation

## 2018-10-16 DIAGNOSIS — Z79899 Other long term (current) drug therapy: Secondary | ICD-10-CM | POA: Insufficient documentation

## 2018-10-16 DIAGNOSIS — I10 Essential (primary) hypertension: Secondary | ICD-10-CM | POA: Insufficient documentation

## 2018-10-16 MED ORDER — DIPHENHYDRAMINE HCL 25 MG PO CAPS
50.0000 mg | ORAL_CAPSULE | Freq: Once | ORAL | Status: AC
  Administered 2018-10-16: 50 mg via ORAL
  Filled 2018-10-16: qty 2

## 2018-10-16 NOTE — Discharge Instructions (Addendum)
You may use over-the-counter Benadryl 50 mg every 8 hours as needed for itching.

## 2018-10-16 NOTE — ED Provider Notes (Signed)
TIME SEEN: 4:28 AM  CHIEF COMPLAINT: Rash  HPI: Patient is a 37 year old female with history of hypertension, diabetes who presents to the emergency department with a rash and itching.  States it has been present for 2 days.  States it is keeping her from sleeping.  She has used over-the-counter Aveeno and an antifungal cream without relief.  No new soaps, lotions, detergents, medications, foods.  No one else in the house has similar rash.  No fever.  ROS: See HPI Constitutional: no fever  Eyes: no drainage  ENT: no runny nose   Cardiovascular:  no chest pain  Resp: no SOB  GI: no vomiting GU: no dysuria Integumentary: no rash  Allergy: no hives  Musculoskeletal: no leg swelling  Neurological: no slurred speech ROS otherwise negative  PAST MEDICAL HISTORY/PAST SURGICAL HISTORY:  Past Medical History:  Diagnosis Date  . Asthma   . Cellulitis of left foot   . Diabetes mellitus without complication ([place])   . GERD (gastroesophageal reflux disease) 10/27/2017  . IBS (irritable bowel syndrome)   . RLS (restless legs syndrome) 10/27/2017    MEDICATIONS:  Prior to Admission medications   Medication Sig Start Date End Date Taking? Authorizing Provider  albuterol (PROVENTIL HFA;VENTOLIN HFA) 108 (90 Base) MCG/ACT inhaler Inhale 2 puffs into the lungs every 6 (six) hours as needed for wheezing or shortness of breath.    [provider]  blood glucose meter kit and supplies Dispense based on patient and insurance preference. Use up to four times daily as directed. (FOR ICD-9 250.00, 250.01). 10/25/16   Patrecia Pour, Christean Grief, MD  fluconazole (DIFLUCAN) 100 MG tablet Take 1 tablet (100 mg total) by mouth daily. 09/09/18   Hosie Poisson, MD  gabapentin (NEURONTIN) 300 MG capsule Take 600 mg by mouth 2 (two) times daily.    [provider]  insulin aspart (NOVOLOG) 100 UNIT/ML injection Inject 35 Units into the skin 3 (three) times daily with meals. Patient taking differently:  Inject 25-50 Units into the skin See admin instructions. 3-4 times daily.  "Sliding scale plus 20 units." 11/02/17   Rai, Ripudeep K, MD  Insulin Degludec (TRESIBA FLEXTOUCH) 200 UNIT/ML SOPN Inject 100 Units into the skin every morning.    [provider]  linaclotide (LINZESS) 145 MCG CAPS capsule Take 145 mcg by mouth daily as needed (irritable bowel syndrome).    [provider]  lisinopril (PRINIVIL,ZESTRIL) 10 MG tablet Take 10 mg by mouth daily. 07/09/18   [provider]  loratadine (CLARITIN) 10 MG tablet Take 1 tablet (10 mg total) by mouth daily. 11/02/17   Rai, Vernelle Emerald, MD  metoCLOPramide (REGLAN) 5 MG tablet Take 1 tablet (5 mg total) by mouth every 6 (six) hours as needed for nausea. 09/09/18   Hosie Poisson, MD  pantoprazole (PROTONIX) 40 MG tablet Take 1 tablet (40 mg total) by mouth daily at 6 (six) AM. 09/10/18   Hosie Poisson, MD  polyethylene glycol (MIRALAX / GLYCOLAX) packet Take 17 g by mouth daily. 09/10/18   Hosie Poisson, MD  senna-docusate (SENOKOT-S) 8.6-50 MG tablet Take 2 tablets by mouth 2 (two) times daily. 09/09/18   Hosie Poisson, MD  traMADol (ULTRAM) 50 MG tablet Take 1 tablet (50 mg total) by mouth every 6 (six) hours as needed for moderate pain. 09/09/18   Hosie Poisson, MD    ALLERGIES:  Allergies  Allergen Reactions  . Morphine And Related Nausea And Vomiting    SOCIAL HISTORY:  Social History  Tobacco Use  . Smoking status: Never Smoker  . Smokeless tobacco: Never Used  Substance Use Topics  . Alcohol use: No    FAMILY HISTORY: Family History  Problem Relation Age of Onset  . Diabetes Mother   . Cancer Mother   . Irritable bowel syndrome Mother   . Hypertension Mother   . Sleep apnea Mother   . Diabetes Father   . Restless legs syndrome Father     EXAM: BP (!) 178/111 (BP Location: Right Arm)   Pulse (!) 103   Temp 98.2 F (36.8 C) (Oral)   Resp 20   Ht '5\' 7"'  (1.702 m)   Wt 108.9 kg   LMP 10/13/2018  (Exact Date)   SpO2 100%   BMI 37.59 kg/m  CONSTITUTIONAL: Alert and oriented and responds appropriately to questions. Well-appearing; well-nourished HEAD: Normocephalic EYES: Conjunctivae clear, pupils appear equal, EOMI ENT: normal nose; moist mucous membranes NECK: Supple, no meningismus, no nuchal rigidity, no LAD  CARD: RRR; S1 and S2 appreciated; no murmurs, no clicks, no rubs, no gallops RESP: Normal chest excursion without splinting or tachypnea; breath sounds clear and equal bilaterally; no wheezes, no rhonchi, no rales, no hypoxia or respiratory distress, speaking full sentences ABD/GI: Normal bowel sounds; non-distended; soft, non-tender, no rebound, no guarding, no peritoneal signs, no hepatosplenomegaly BACK:  The back appears normal and is non-tender to palpation, there is no CVA tenderness EXT: Normal ROM in all joints; non-tender to palpation; no edema; normal capillary refill; no cyanosis, no calf tenderness or swelling    SKIN: Normal color for age and race; warm; urticaria.  No blisters or desquamation.  No petechiae or purpura.  Small scattered erythematous macular lesions to the extremities and torso with associated excoriations but no induration, drainage, fluctuance noted. NEURO: Moves all extremities equally PSYCH: The patient's mood and manner are appropriate. Grooming and personal hygiene are appropriate.  MEDICAL DECISION MAKING: Patient here with a rash that appears consistent with contact dermatitis.  No signs of superimposed infection.  She has not tried any antihistamines at home.  Have recommended Benadryl and hydrocortisone cream.  We discussed risks and benefits of systemic steroids but given she has a history of diabetes, I feel at this time her risk outweigh benefits given she has not yet tried antihistamines.  She is comfortable with this plan and has a PCP for follow-up if symptoms are not improving.  No sign of any life-threatening rash on examination.  At  this time, I do not feel there is any life-threatening condition present. I have reviewed and discussed all results (EKG, imaging, lab, urine as appropriate) and exam findings with patient/family. I have reviewed nursing notes and appropriate previous records.  I feel the patient is safe to be discharged home without further emergent workup and can continue workup as an outpatient as needed. Discussed usual and customary return precautions. Patient/family verbalize understanding and are comfortable with this plan.  Outpatient follow-up has been provided if needed. All questions have been answered.      Goku Harb, Delice Bison, DO 10/16/18 [phone]

## 2018-10-16 NOTE — ED Notes (Signed)
ED Provider at bedside. 

## 2018-10-16 NOTE — ED Triage Notes (Signed)
Pt states she has a rash that started on Tuesday evening  Pt states it is all over her body and she is itching all over  Pt denies using anything new  Pt states she has used cortisone cream without relief

## 2018-12-10 ENCOUNTER — Emergency Department (HOSPITAL_BASED_OUTPATIENT_CLINIC_OR_DEPARTMENT_OTHER)
Admission: EM | Admit: 2018-12-10 | Discharge: 2018-12-10 | Disposition: A | Discharge status: Home / Self Care | Payer: Medicaid Other | Attending: Emergency Medicine | Admitting: Emergency Medicine

## 2018-12-10 ENCOUNTER — Encounter (HOSPITAL_BASED_OUTPATIENT_CLINIC_OR_DEPARTMENT_OTHER): Payer: Self-pay | Admitting: Emergency Medicine

## 2018-12-10 ENCOUNTER — Other Ambulatory Visit: Payer: Self-pay

## 2018-12-10 DIAGNOSIS — Z9049 Acquired absence of other specified parts of digestive tract: Secondary | ICD-10-CM | POA: Diagnosis not present

## 2018-12-10 DIAGNOSIS — Z79899 Other long term (current) drug therapy: Secondary | ICD-10-CM | POA: Insufficient documentation

## 2018-12-10 DIAGNOSIS — E119 Type 2 diabetes mellitus without complications: Secondary | ICD-10-CM | POA: Diagnosis not present

## 2018-12-10 DIAGNOSIS — Z794 Long term (current) use of insulin: Secondary | ICD-10-CM | POA: Insufficient documentation

## 2018-12-10 DIAGNOSIS — Z760 Encounter for issue of repeat prescription: Secondary | ICD-10-CM | POA: Diagnosis present

## 2018-12-10 DIAGNOSIS — J45909 Unspecified asthma, uncomplicated: Secondary | ICD-10-CM | POA: Diagnosis not present

## 2018-12-10 MED ORDER — LISINOPRIL 10 MG PO TABS: 10.0000 mg | ORAL_TABLET | Freq: Every day | ORAL | 0 refills | Status: DC

## 2018-12-10 MED ORDER — GABAPENTIN 300 MG PO CAPS: 600.0000 mg | ORAL_CAPSULE | Freq: Two times a day (BID) | ORAL | 0 refills | Status: DC

## 2018-12-10 NOTE — ED Notes (Signed)
Pt is c/o bilateral leg pain for 2 weeks  Pt also needs her medications refilled

## 2018-12-10 NOTE — ED Provider Notes (Signed)
[facility] EM[facility]GENCY DEPARTMENT Provider Note   CSN: [id number] Arrival date & time: 12/10/18  2138     History   Chief Complaint Chief Complaint  Patient presents with  . Medication Refill    HPI Lindsay Love is a 38 y.o. female.  HPI Patient presents to the emergency department resting medication refills.  The patient states that she takes gabapentin in the morning and in the evening.  She also takes lisinopril for her blood pressure.  States that she has not seen her doctor recently because she was switched to a new doctor on her Medicaid card.  The patient states that she has been without the medications for about 2 weeks. Past Medical History:  Diagnosis Date  . Asthma   . Cellulitis of left foot   . Diabetes mellitus without complication ([place])   . GERD (gastroesophageal reflux disease) 10/27/2017  . IBS (irritable bowel syndrome)   . RLS (restless legs syndrome) 10/27/2017    Patient Active Problem List   Diagnosis Date Noted  . Sepsis secondary to UTI ([place]) 09/06/2018  . AKI (acute kidney injury) ([place]) 09/06/2018  . Pyelonephritis   . UTI (urinary tract infection) 07/20/2018  . H/O amputation of lesser toe, right ([place]) 11/07/2017  . Right foot infection 10/27/2017  . Cellulitis in diabetic foot ([place]) 10/27/2017  . Hyperglycemia 10/27/2017  . URI (upper respiratory infection) 10/27/2017  . Hyponatremia 10/27/2017  . Lactic acidosis 10/27/2017  . GERD (gastroesophageal reflux disease) 10/27/2017  . RLS (restless legs syndrome) 10/27/2017  . Vaginal candidiasis 10/27/2017  . 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 IBS (irritable bowel syndrome) 10/22/2016  . Asthma 10/22/2016  . Right leg pain 10/22/2016  . Cellulitis and abscess of toe of right foot   . Diabetic peripheral neuropathy ([place])   . Failure of outpatient treatment     Past Surgical History:  Procedure Laterality Date  . AMPUTATION Right  11/01/2017   Procedure: RIGHT FOOT 5TH RAY AMPUTATION;  Surgeon: Newt Minion, MD;  Location: [facility];  Service: Orthopedics;  Laterality: Right;  . CESAREAN SECTION    . CHOLECYSTECTOMY    . CYST EXCISION    . TONSILLECTOMY    . TOOTH EXTRACTION       OB History   No obstetric history on file.      Home Medications    Prior to Admission medications   Medication Sig Start Date End Date Taking? Authorizing Provider  gabapentin (NEURONTIN) 300 MG capsule Take 600 mg by mouth 2 (two) times daily.   Yes [provider]  lisinopril (PRINIVIL,ZESTRIL) 10 MG tablet Take 10 mg by mouth daily. 07/09/18  Yes [provider]  ACCU-CHEK FASTCLIX LANCETS MISC USE TO TEST BLOOD SUGAR 5 TIMES DAILY 09/17/18   [provider]  albuterol (PROVENTIL HFA;VENTOLIN HFA) 108 (90 Base) MCG/ACT inhaler Inhale 2 puffs into the lungs every 6 (six) hours as needed for wheezing or shortness of breath.    [provider]  blood glucose meter kit and supplies Dispense based on patient and insurance preference. Use up to four times daily as directed. (FOR ICD-9 250.00, 250.01). 10/25/16   Patrecia Pour, Christean Grief, MD  fluconazole (DIFLUCAN) 100 MG tablet Take 1 tablet (100 mg total) by mouth daily. 09/09/18   Hosie Poisson, MD  insulin aspart (NOVOLOG) 100 UNIT/ML injection Inject 35 Units into the skin 3 (three) times daily with meals. Patient taking differently: Inject 25-50 Units into the  skin See admin instructions. 3-4 times daily.  "Sliding scale plus 20 units." 11/02/17   Rai, Ripudeep K, MD  Insulin Degludec (TRESIBA FLEXTOUCH) 200 UNIT/ML SOPN Inject 100 Units into the skin every morning.    [provider]  linaclotide (LINZESS) 145 MCG CAPS capsule Take 145 mcg by mouth daily as needed (irritable bowel syndrome).    [provider]  loratadine (CLARITIN) 10 MG tablet Take 1 tablet (10 mg total) by mouth daily. 11/02/17   Rai, Vernelle Emerald, MD  metoCLOPramide  (REGLAN) 5 MG tablet Take 1 tablet (5 mg total) by mouth every 6 (six) hours as needed for nausea. 09/09/18   Hosie Poisson, MD  pantoprazole (PROTONIX) 40 MG tablet Take 1 tablet (40 mg total) by mouth daily at 6 (six) AM. 09/10/18   Hosie Poisson, MD  polyethylene glycol (MIRALAX / GLYCOLAX) packet Take 17 g by mouth daily. 09/10/18   Hosie Poisson, MD  senna-docusate (SENOKOT-S) 8.6-50 MG tablet Take 2 tablets by mouth 2 (two) times daily. 09/09/18   Hosie Poisson, MD  traMADol (ULTRAM) 50 MG tablet Take 1 tablet (50 mg total) by mouth every 6 (six) hours as needed for moderate pain. 09/09/18   Hosie Poisson, MD    Family History Family History  Problem Relation Age of Onset  . Diabetes Mother   . Cancer Mother   . Irritable bowel syndrome Mother   . Hypertension Mother   . Sleep apnea Mother   . Diabetes Father   . Restless legs syndrome Father     Social History Social History   Tobacco Use  . Smoking status: Never Smoker  . Smokeless tobacco: Never Used  Substance Use Topics  . Alcohol use: No  . Drug use: No     Allergies   Morphine and related   Review of Systems Review of Systems All other systems negative except as documented in the HPI. All pertinent positives and negatives as reviewed in the HPI.  Physical Exam Updated Vital Signs BP (!) 183/97 (BP Location: Right Arm)   Pulse (!) 108   Temp 97.9 F (36.6 C)   Resp 20   Ht _0  (1.702 m)   Wt 111.4 kg   LMP 11/13/2018 (Exact Date)   SpO2 100%   BMI 38.48 kg/m   Physical Exam Vitals signs and nursing note reviewed.  Constitutional:      General: She is not in acute distress.    Appearance: She is well-developed.  HENT:     Head: Normocephalic and atraumatic.  Eyes:     Pupils: Pupils are equal, round, and reactive to light.  Pulmonary:     Effort: Pulmonary effort is normal.  Skin:    General: Skin is warm and dry.  Neurological:     Mental Status: She is alert and oriented to person,  place, and time.      ED Treatments / Results  Labs (all labs ordered are listed, but only abnormal results are displayed) Labs Reviewed - No data to display  EKG None  Radiology No results found.  Procedures Procedures (including critical care time)  Medications Ordered in ED Medications - No data to display   Initial Impression / Assessment and Plan / ED Course  I have reviewed the triage vital signs and the nursing notes.  Pertinent labs & imaging results that were available during my care of the patient were reviewed by me and considered in my medical decision making (see chart for details).  Patient will be refilled a limited quantity on her medications.  I advised the patient to return here as needed.  Patient agrees the plan and all questions were answered.  Final Clinical Impressions(s) / ED Diagnoses   Final diagnoses:  None    ED Discharge Orders    None       Rebeca Allegra 12/10/18 [place], MD 12/12/18 [phone]

## 2018-12-10 NOTE — ED Triage Notes (Signed)
Pt arrives requesting med refill, reports she's needing a new medicaid card and hasn't been able to get into PCP without it. Needs refill on gabapentin and lisinopril -  Hasn't had them in two weeks.

## 2018-12-10 NOTE — Discharge Instructions (Addendum)
Return here as needed.  Follow-up with your doctor soon as possible. °

## 2018-12-22 ENCOUNTER — Other Ambulatory Visit: Payer: Self-pay

## 2018-12-22 ENCOUNTER — Encounter (HOSPITAL_BASED_OUTPATIENT_CLINIC_OR_DEPARTMENT_OTHER): Payer: Self-pay | Admitting: *Deleted

## 2018-12-22 ENCOUNTER — Emergency Department (HOSPITAL_BASED_OUTPATIENT_CLINIC_OR_DEPARTMENT_OTHER)
Admission: EM | Admit: 2018-12-22 | Discharge: 2018-12-22 | Disposition: A | Discharge status: Home / Self Care | Payer: Medicaid Other | Attending: Emergency Medicine | Admitting: Emergency Medicine

## 2018-12-22 DIAGNOSIS — Z7984 Long term (current) use of oral hypoglycemic drugs: Secondary | ICD-10-CM | POA: Insufficient documentation

## 2018-12-22 DIAGNOSIS — Z79899 Other long term (current) drug therapy: Secondary | ICD-10-CM | POA: Insufficient documentation

## 2018-12-22 DIAGNOSIS — E1140 Type 2 diabetes mellitus with diabetic neuropathy, unspecified: Secondary | ICD-10-CM | POA: Diagnosis not present

## 2018-12-22 DIAGNOSIS — Z760 Encounter for issue of repeat prescription: Secondary | ICD-10-CM | POA: Diagnosis present

## 2018-12-22 DIAGNOSIS — J45909 Unspecified asthma, uncomplicated: Secondary | ICD-10-CM | POA: Diagnosis not present

## 2018-12-22 MED ORDER — ACCU-CHEK FASTCLIX LANCETS MISC: 3 refills | Status: AC

## 2018-12-22 MED ORDER — GABAPENTIN 300 MG PO CAPS: 600.0000 mg | ORAL_CAPSULE | Freq: Two times a day (BID) | ORAL | 0 refills | Status: DC

## 2018-12-22 MED ORDER — GLUCOSE BLOOD VI STRP: ORAL_STRIP | 2 refills | Status: AC

## 2018-12-22 NOTE — ED Notes (Signed)
Pt denies any untoward sx at this time.  Presents to Ed for refill of her Gabapentin, Test strips, and Lancets.

## 2018-12-22 NOTE — Discharge Instructions (Addendum)
Please follow-up and establish care with a PCP as soon as you are able.  Please return the emergency department if you develop any new or worsening symptoms.

## 2018-12-22 NOTE — ED Provider Notes (Signed)
[facility] Provider Note   CSN: [id number] Arrival date & time: 12/22/18  1557     History   Chief Complaint Chief Complaint  Patient presents with  . Medication Refill    HPI Lindsay Love is a 38 y.o. female with history of asthma, diabetes, RLS, GERD, IBS who presents for medication refill.  Patient reports she is waiting on her Medicaid card and was told it would be another month.  She is unable to see a PCP until she has her Medicaid card.  She requests refill of gabapentin which she takes twice daily, 600 mg.  She also requests refill of test strips and lancets.  She reports her blood sugars have been good, 150-200 lately.  She checks her sugar 3-4 times daily.  She has enough [place] and insulin.  She reports she has had some nasal congestion and intermittent ear pain lately.  She denies any fevers or significant cough.  She has had some postnasal drip.  HPI  Past Medical History:  Diagnosis Date  . Asthma   . Cellulitis of left foot   . Diabetes mellitus without complication ([place])   . GERD (gastroesophageal reflux disease) 10/27/2017  . IBS (irritable bowel syndrome)   . RLS (restless legs syndrome) 10/27/2017    Patient Active Problem List   Diagnosis Date Noted  . Sepsis secondary to UTI ([place]) 09/06/2018  . AKI (acute kidney injury) ([place]) 09/06/2018  . Pyelonephritis   . UTI (urinary tract infection) 07/20/2018  . H/O amputation of lesser toe, right ([place]) 11/07/2017  . Right foot infection 10/27/2017  . Cellulitis in diabetic foot ([place]) 10/27/2017  . Hyperglycemia 10/27/2017  . URI (upper respiratory infection) 10/27/2017  . Hyponatremia 10/27/2017  . Lactic acidosis 10/27/2017  . GERD (gastroesophageal reflux disease) 10/27/2017  . RLS (restless legs syndrome) 10/27/2017  . Vaginal candidiasis 10/27/2017  . 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 IBS (irritable bowel syndrome) 10/22/2016    . Asthma 10/22/2016  . Right leg pain 10/22/2016  . Cellulitis and abscess of toe of right foot   . Diabetic peripheral neuropathy ([place])   . Failure of outpatient treatment     Past Surgical History:  Procedure Laterality Date  . AMPUTATION Right 11/01/2017   Procedure: RIGHT FOOT 5TH RAY AMPUTATION;  Surgeon: Newt Minion, MD;  Location: [facility];  Service: Orthopedics;  Laterality: Right;  . CESAREAN SECTION    . CHOLECYSTECTOMY    . CYST EXCISION    . TONSILLECTOMY    . TOOTH EXTRACTION       OB History   No obstetric history on file.      Home Medications    Prior to Admission medications   Medication Sig Start Date End Date Taking? Authorizing Provider  ACCU-CHEK FASTCLIX LANCETS MISC USE TO TEST BLOOD SUGAR 5 TIMES DAILY 12/22/18   Clotilda Hafer, Bea Graff, PA-C  albuterol (PROVENTIL HFA;VENTOLIN HFA) 108 (90 Base) MCG/ACT inhaler Inhale 2 puffs into the lungs every 6 (six) hours as needed for wheezing or shortness of breath.    [provider]  blood glucose meter kit and supplies Dispense based on patient and insurance preference. Use up to four times daily as directed. (FOR ICD-9 250.00, 250.01). 10/25/16   Patrecia Pour, Christean Grief, MD  fluconazole (DIFLUCAN) 100 MG tablet Take 1 tablet (100 mg total) by mouth daily. 09/09/18   Hosie Poisson, MD  gabapentin (NEURONTIN) 300 MG capsule Take 2 capsules (600  mg total) by mouth 2 (two) times daily for 30 days. 12/22/18 01/21/19  Frederica Kuster, PA-C  glucose blood (ACCU-CHEK GUIDE) test strip Use as instructed 12/22/18   Manjot Hinks, Bea Graff, PA-C  insulin aspart (NOVOLOG) 100 UNIT/ML injection Inject 35 Units into the skin 3 (three) times daily with meals. Patient taking differently: Inject 25-50 Units into the skin See admin instructions. 3-4 times daily.  "Sliding scale plus 20 units." 11/02/17   Rai, Ripudeep K, MD  Insulin Degludec (TRESIBA FLEXTOUCH) 200 UNIT/ML SOPN Inject 100 Units into the skin every morning.    [provider]  linaclotide (LINZESS) 145 MCG CAPS capsule Take 145 mcg by mouth daily as needed (irritable bowel syndrome).    [provider]  lisinopril (PRINIVIL,ZESTRIL) 10 MG tablet Take 1 tablet (10 mg total) by mouth daily. 12/10/18   Lawyer, Harrell Gave, PA-C  loratadine (CLARITIN) 10 MG tablet Take 1 tablet (10 mg total) by mouth daily. 11/02/17   Rai, Vernelle Emerald, MD  metoCLOPramide (REGLAN) 5 MG tablet Take 1 tablet (5 mg total) by mouth every 6 (six) hours as needed for nausea. 09/09/18   Hosie Poisson, MD  pantoprazole (PROTONIX) 40 MG tablet Take 1 tablet (40 mg total) by mouth daily at 6 (six) AM. 09/10/18   Hosie Poisson, MD  polyethylene glycol (MIRALAX / GLYCOLAX) packet Take 17 g by mouth daily. 09/10/18   Hosie Poisson, MD  senna-docusate (SENOKOT-S) 8.6-50 MG tablet Take 2 tablets by mouth 2 (two) times daily. 09/09/18   Hosie Poisson, MD  traMADol (ULTRAM) 50 MG tablet Take 1 tablet (50 mg total) by mouth every 6 (six) hours as needed for moderate pain. 09/09/18   Hosie Poisson, MD    Family History Family History  Problem Relation Age of Onset  . Diabetes Mother   . Cancer Mother   . Irritable bowel syndrome Mother   . Hypertension Mother   . Sleep apnea Mother   . Diabetes Father   . Restless legs syndrome Father     Social History Social History   Tobacco Use  . Smoking status: Never Smoker  . Smokeless tobacco: Never Used  Substance Use Topics  . Alcohol use: No  . Drug use: No     Allergies   Morphine and related   Review of Systems Review of Systems  Constitutional: Negative for chills and fever.  HENT: Positive for congestion and ear pain.   Respiratory: Negative for shortness of breath.   Cardiovascular: Negative for chest pain.  Gastrointestinal: Negative for abdominal pain, nausea and vomiting.     Physical Exam Updated Vital Signs BP (!) 169/105 (BP Location: Left Arm)   Pulse (!) 118   Temp 98.3 F (36.8 C) (Oral)   Resp  18   Ht '5\' 7"'  (1.702 m)   Wt 111.4 kg   LMP 12/19/2018   SpO2 96%   BMI 38.47 kg/m   Physical Exam Vitals signs and nursing note reviewed.  Constitutional:      General: She is not in acute distress.    Appearance: She is well-developed. She is not diaphoretic.  HENT:     Head: Normocephalic and atraumatic.     Right Ear: Tympanic membrane normal.     Left Ear: Tympanic membrane normal.     Mouth/Throat:     Pharynx: No oropharyngeal exudate or posterior oropharyngeal erythema.  Eyes:     General: No scleral icterus.       Right eye: No discharge.  Left eye: No discharge.     Conjunctiva/sclera: Conjunctivae normal.     Pupils: Pupils are equal, round, and reactive to light.  Neck:     Musculoskeletal: Normal range of motion and neck supple.     Thyroid: No thyromegaly.  Cardiovascular:     Rate and Rhythm: Normal rate and regular rhythm.     Heart sounds: Normal heart sounds. No murmur. No friction rub. No gallop.   Pulmonary:     Effort: Pulmonary effort is normal. No respiratory distress.     Breath sounds: Normal breath sounds. No stridor. No wheezing or rales.  Abdominal:     General: Bowel sounds are normal. There is no distension.     Palpations: Abdomen is soft.     Tenderness: There is no abdominal tenderness. There is no guarding or rebound.  Lymphadenopathy:     Cervical: No cervical adenopathy.  Skin:    General: Skin is warm and dry.     Coloration: Skin is not pale.     Findings: No rash.  Neurological:     Mental Status: She is alert.     Coordination: Coordination normal.      ED Treatments / Results  Labs (all labs ordered are listed, but only abnormal results are displayed) Labs Reviewed - No data to display  EKG None  Radiology No results found.  Procedures Procedures (including critical care time)  Medications Ordered in ED Medications - No data to display   Initial Impression / Assessment and Plan / ED Course  I have  reviewed the triage vital signs and the nursing notes.  Pertinent labs & imaging results that were available during my care of the patient were reviewed by me and considered in my medical decision making (see chart for details).     Patient presents for refills of gabapentin, test strips, and lancets.  These were refilled for 1 month as patient states she is unable to see a PCP until she receives her Medicaid card.  Patient seems to have good control of her blood sugar.  She takes gabapentin for RLS.  That she has been taking it for a long time.  She also reports some nasal congestion intermittent ear pain.  Suspect intermittent confusion.  I recommended taking her home Zyrtec as needed.  Return precautions discussed.  Follow-up to PCP for further medication refills.  Patient understands and agrees with plan.  Patient's blood pressure elevated.  She did not take her blood pressure medication this morning.  I advised to take when she gets home.  Patient also with elevated heart rate, but she states this is normal for her and has been for several years.  This is confirmed with previous visits.  Advised her to speak with her PCP about this when she sees them.  Patient discharged in satisfactory condition.  Final Clinical Impressions(s) / ED Diagnoses   Final diagnoses:  Encounter for medication refill    ED Discharge Orders         Ordered    gabapentin (NEURONTIN) 300 MG capsule  2 times daily     12/22/18 1624    ACCU-CHEK FASTCLIX LANCETS MISC     12/22/18 1624    glucose blood (ACCU-CHEK GUIDE) test strip     12/22/18 [address] PA-C 12/22/18 [place], [place], DO 12/23/18 [phone]

## 2018-12-22 NOTE — ED Triage Notes (Signed)
States she needs a refill on her medications.

## 2019-01-27 ENCOUNTER — Encounter (HOSPITAL_BASED_OUTPATIENT_CLINIC_OR_DEPARTMENT_OTHER): Payer: Self-pay | Admitting: Emergency Medicine

## 2019-01-27 ENCOUNTER — Emergency Department (HOSPITAL_BASED_OUTPATIENT_CLINIC_OR_DEPARTMENT_OTHER): Payer: Medicaid Other

## 2019-01-27 ENCOUNTER — Emergency Department (HOSPITAL_BASED_OUTPATIENT_CLINIC_OR_DEPARTMENT_OTHER)
Admission: EM | Admit: 2019-01-27 | Discharge: 2019-01-27 | Disposition: A | Discharge status: Home / Self Care | Payer: Medicaid Other | Attending: Emergency Medicine | Admitting: Emergency Medicine

## 2019-01-27 ENCOUNTER — Other Ambulatory Visit: Payer: Self-pay

## 2019-01-27 DIAGNOSIS — Z79899 Other long term (current) drug therapy: Secondary | ICD-10-CM | POA: Diagnosis not present

## 2019-01-27 DIAGNOSIS — Z794 Long term (current) use of insulin: Secondary | ICD-10-CM | POA: Insufficient documentation

## 2019-01-27 DIAGNOSIS — M79605 Pain in left leg: Secondary | ICD-10-CM | POA: Insufficient documentation

## 2019-01-27 DIAGNOSIS — J45909 Unspecified asthma, uncomplicated: Secondary | ICD-10-CM | POA: Diagnosis not present

## 2019-01-27 DIAGNOSIS — E119 Type 2 diabetes mellitus without complications: Secondary | ICD-10-CM | POA: Diagnosis not present

## 2019-01-27 MED ORDER — CYCLOBENZAPRINE HCL 10 MG PO TABS: 10.0000 mg | ORAL_TABLET | Freq: Three times a day (TID) | ORAL | 0 refills | Status: DC | PRN

## 2019-01-27 MED ORDER — PREDNISONE 10 MG PO TABS: 20.0000 mg | ORAL_TABLET | Freq: Two times a day (BID) | ORAL | 0 refills | Status: DC

## 2019-01-27 MED ORDER — TRAMADOL HCL 50 MG PO TABS: 50.0000 mg | ORAL_TABLET | Freq: Four times a day (QID) | ORAL | 0 refills | Status: DC | PRN

## 2019-01-27 NOTE — Discharge Instructions (Addendum)
Prednisone as prescribed.  Flexeril and tramadol as prescribed as needed for pain.  Follow-up with your primary doctor if symptoms or not improving in the next week.

## 2019-01-27 NOTE — ED Notes (Signed)
ED Provider at bedside. 

## 2019-01-27 NOTE — ED Triage Notes (Signed)
L leg pain x 3 weeks. States it feels like a cramp. Had surgery on R foot in October.

## 2019-01-27 NOTE — ED Provider Notes (Signed)
[facility] Provider Note   CSN: [id number] Arrival date & time: 01/27/19  1856    History   Chief Complaint Chief Complaint  Patient presents with  . Leg Pain    HPI Lindsay Love is a 38 y.o. female.     Patient is a 38 year old female with history of asthma, diabetes, peripheral neuropathy, irritable bowel.  She presents today with complaints of pain in her left leg.  For the past 3 weeks she has been experiencing discomfort in her calf and back of her left thigh.  This began in the absence of any injury or trauma.  Her pain is worse with ambulating and movement.  It is relieved with rest.  She has been taking ibuprofen with little relief.  The history is provided by the patient.  Leg Pain  Lower extremity pain location: Back of thigh and calf. Time since incident:  3 weeks Pain details:    Quality:  Sharp   Radiates to:  Does not radiate   Severity:  Severe   Onset quality:  Gradual   Timing:  Constant   Progression:  Worsening Chronicity:  New   Past Medical History:  Diagnosis Date  . Asthma   . Cellulitis of left foot   . Diabetes mellitus without complication ([place])   . GERD (gastroesophageal reflux disease) 10/27/2017  . IBS (irritable bowel syndrome)   . RLS (restless legs syndrome) 10/27/2017    Patient Active Problem List   Diagnosis Date Noted  . Sepsis secondary to UTI ([place]) 09/06/2018  . AKI (acute kidney injury) ([place]) 09/06/2018  . Pyelonephritis   . UTI (urinary tract infection) 07/20/2018  . H/O amputation of lesser toe, right ([place]) 11/07/2017  . Right foot infection 10/27/2017  . Cellulitis in diabetic foot ([place]) 10/27/2017  . Hyperglycemia 10/27/2017  . URI (upper respiratory infection) 10/27/2017  . Hyponatremia 10/27/2017  . Lactic acidosis 10/27/2017  . GERD (gastroesophageal reflux disease) 10/27/2017  . RLS (restless legs syndrome) 10/27/2017  . Vaginal candidiasis 10/27/2017  . 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 IBS (irritable bowel syndrome) 10/22/2016  . Asthma 10/22/2016  . Right leg pain 10/22/2016  . Cellulitis and abscess of toe of right foot   . Diabetic peripheral neuropathy ([place])   . Failure of outpatient treatment     Past Surgical History:  Procedure Laterality Date  . AMPUTATION Right 11/01/2017   Procedure: RIGHT FOOT 5TH RAY AMPUTATION;  Surgeon: Newt Minion, MD;  Location: [facility];  Service: Orthopedics;  Laterality: Right;  . CESAREAN SECTION    . CHOLECYSTECTOMY    . CYST EXCISION    . TONSILLECTOMY    . TOOTH EXTRACTION       OB History   No obstetric history on file.      Home Medications    Prior to Admission medications   Medication Sig Start Date End Date Taking? Authorizing Provider  ACCU-CHEK FASTCLIX LANCETS MISC USE TO TEST BLOOD SUGAR 5 TIMES DAILY 12/22/18   Law, Bea Graff, PA-C  albuterol (PROVENTIL HFA;VENTOLIN HFA) 108 (90 Base) MCG/ACT inhaler Inhale 2 puffs into the lungs every 6 (six) hours as needed for wheezing or shortness of breath.    [provider]  blood glucose meter kit and supplies Dispense based on patient and insurance preference. Use up to four times daily as directed. (FOR ICD-9 250.00, 250.01). 10/25/16   Patrecia Pour, Christean Grief, MD  fluconazole (DIFLUCAN) 100 MG  tablet Take 1 tablet (100 mg total) by mouth daily. 09/09/18   Hosie Poisson, MD  gabapentin (NEURONTIN) 300 MG capsule Take 2 capsules (600 mg total) by mouth 2 (two) times daily for 30 days. 12/22/18 01/21/19  Frederica Kuster, PA-C  glucose blood (ACCU-CHEK GUIDE) test strip Use as instructed 12/22/18   Law, Bea Graff, PA-C  insulin aspart (NOVOLOG) 100 UNIT/ML injection Inject 35 Units into the skin 3 (three) times daily with meals. Patient taking differently: Inject 25-50 Units into the skin See admin instructions. 3-4 times daily.  "Sliding scale plus 20 units." 11/02/17   Rai, Ripudeep K, MD  Insulin Degludec  (TRESIBA FLEXTOUCH) 200 UNIT/ML SOPN Inject 100 Units into the skin every morning.    [provider]  linaclotide (LINZESS) 145 MCG CAPS capsule Take 145 mcg by mouth daily as needed (irritable bowel syndrome).    [provider]  lisinopril (PRINIVIL,ZESTRIL) 10 MG tablet Take 1 tablet (10 mg total) by mouth daily. 12/10/18   Lawyer, Harrell Gave, PA-C  loratadine (CLARITIN) 10 MG tablet Take 1 tablet (10 mg total) by mouth daily. 11/02/17   Rai, Vernelle Emerald, MD  metoCLOPramide (REGLAN) 5 MG tablet Take 1 tablet (5 mg total) by mouth every 6 (six) hours as needed for nausea. 09/09/18   Hosie Poisson, MD  pantoprazole (PROTONIX) 40 MG tablet Take 1 tablet (40 mg total) by mouth daily at 6 (six) AM. 09/10/18   Hosie Poisson, MD  polyethylene glycol (MIRALAX / GLYCOLAX) packet Take 17 g by mouth daily. 09/10/18   Hosie Poisson, MD  senna-docusate (SENOKOT-S) 8.6-50 MG tablet Take 2 tablets by mouth 2 (two) times daily. 09/09/18   Hosie Poisson, MD  traMADol (ULTRAM) 50 MG tablet Take 1 tablet (50 mg total) by mouth every 6 (six) hours as needed for moderate pain. 09/09/18   Hosie Poisson, MD    Family History Family History  Problem Relation Age of Onset  . Diabetes Mother   . Cancer Mother   . Irritable bowel syndrome Mother   . Hypertension Mother   . Sleep apnea Mother   . Diabetes Father   . Restless legs syndrome Father     Social History Social History   Tobacco Use  . Smoking status: Never Smoker  . Smokeless tobacco: Never Used  Substance Use Topics  . Alcohol use: No  . Drug use: No     Allergies   Morphine and related   Review of Systems Review of Systems  All other systems reviewed and are negative.    Physical Exam Updated Vital Signs BP (!) 184/111 (BP Location: Left Arm)   Pulse (!) 113   Temp 98.5 F (36.9 C) (Oral)   Resp 20   Ht _0  (1.702 m)   Wt 113.4 kg   LMP 01/20/2019   SpO2 100%   BMI 39.16 kg/m   Physical Exam Vitals  signs and nursing note reviewed.  Constitutional:      General: She is not in acute distress.    Appearance: She is well-developed. She is not diaphoretic.  HENT:     Head: Normocephalic and atraumatic.  Neck:     Musculoskeletal: Normal range of motion and neck supple.  Cardiovascular:     Rate and Rhythm: Normal rate and regular rhythm.     Heart sounds: No murmur. No friction rub. No gallop.   Pulmonary:     Effort: Pulmonary effort is normal. No respiratory distress.     Breath  sounds: Normal breath sounds. No wheezing.  Abdominal:     General: Bowel sounds are normal. There is no distension.     Palpations: Abdomen is soft.     Tenderness: There is no abdominal tenderness.  Musculoskeletal: Normal range of motion.     Comments: The left lower extremity appears grossly normal.  There is no swelling or edema.  There is tenderness to palpation in the calf and posterior thigh, but no palpable cords.  DP and PT pulses are easily palpable and motor and sensation is intact throughout the entire foot.  Skin:    General: Skin is warm and dry.  Neurological:     Mental Status: She is alert and oriented to person, place, and time.      ED Treatments / Results  Labs (all labs ordered are listed, but only abnormal results are displayed) Labs Reviewed - No data to display  EKG None  Radiology No results found.  Procedures Procedures (including critical care time)  Medications Ordered in ED Medications - No data to display   Initial Impression / Assessment and Plan / ED Course  I have reviewed the triage vital signs and the nursing notes.  Pertinent labs & imaging results that were available during my care of the patient were reviewed by me and considered in my medical decision making (see chart for details).  Patient presenting with complaints of severe pain in the back of her left leg, the etiology of which I am uncertain.  Her ultrasound shows no evidence for DVT or other  abnormality.  Patient will be discharged with prednisone, tramadol, and Flexeril.  She is to follow-up with her primary doctor if not improving.  Final Clinical Impressions(s) / ED Diagnoses   Final diagnoses:  None    ED Discharge Orders    None       Veryl Speak, MD 01/27/19 2258

## 2019-01-27 NOTE — ED Notes (Signed)
Patient transported to Ultrasound 

## 2019-04-06 IMAGING — CR DG TOE 5TH 2+V*R*
3 series · 3 of 3 positions shown · non-contrast
Comparison: 05/25/2017

CLINICAL DATA: Open wound over fifth toe for 3 months. On
antibiotics.

EXAM:
RIGHT FIFTH TOE

[t toes ap right]
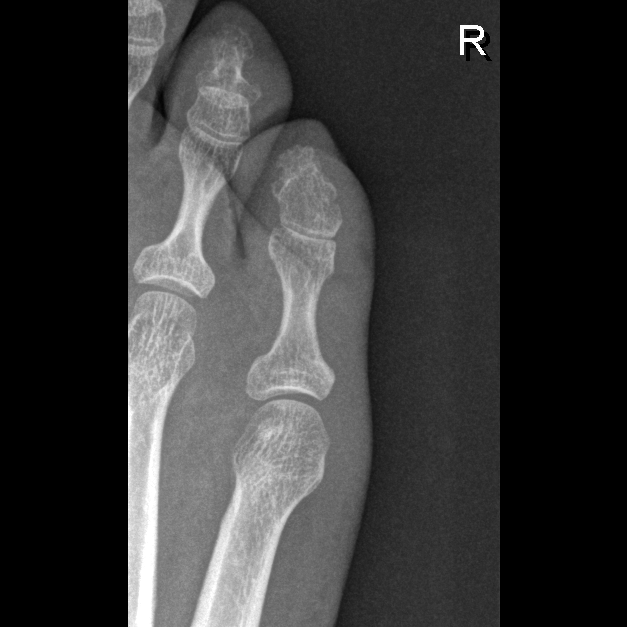

[t toes oblique right]
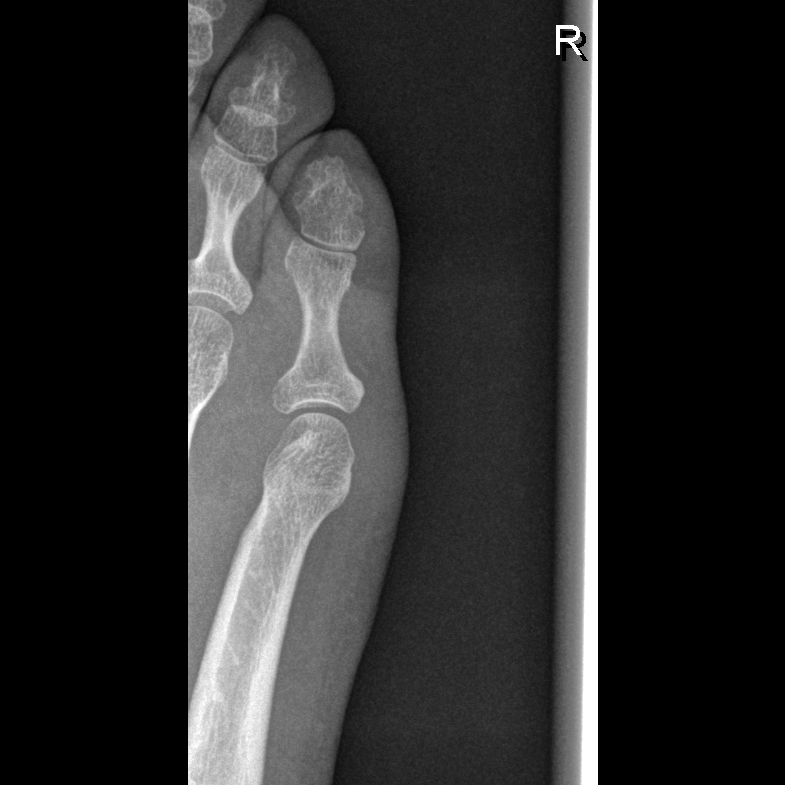

[t toes lateral right]
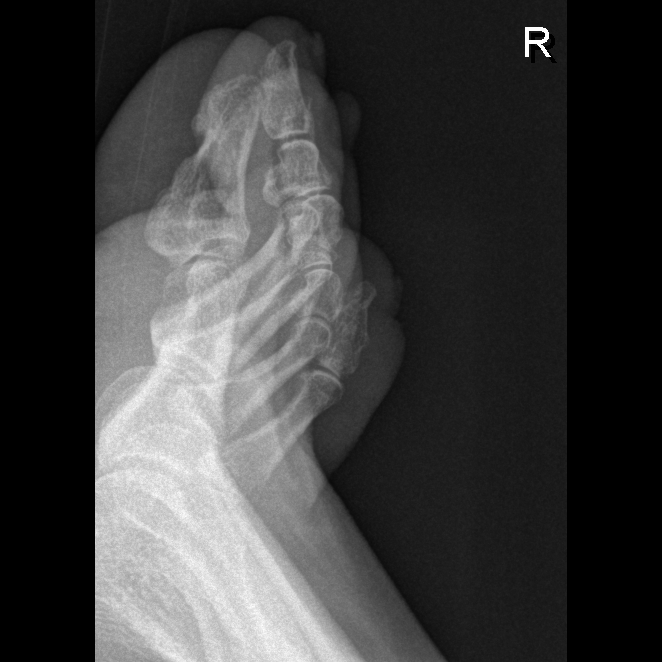

[3 of 3 positions shown; findings below may reference images not displayed]

FINDINGS: Mild diffuse soft tissue swelling noted. No underlying fracture or
dislocation. No focal bone erosion identified.
IMPRESSION: 1. No acute bone abnormality.
2. Soft tissue swelling.

## 2019-05-31 NOTE — Progress Notes (Signed)
[facility] Note  06/01/2019     CHIEF COMPLAINT Patient presents for Retina Evaluation   HISTORY OF PRESENT ILLNESS: Lindsay Love is a 38 y.o. female who presents to the clinic today for:   HPI    Retina Evaluation    In left eye.  This started 1 week ago.  Duration of 1 week.  Associated Symptoms Floaters and Distortion.  Context:  distance vision, mid-range vision and near vision.  Treatments tried include no treatments.  I, the attending physician,  performed the HPI with the patient and updated documentation appropriately.          Comments    38 y/o female pt referred by Dr. Maryjane Hurter for DEE and eval of loss of vision OS.  Saw Dr. Marin Comment 4 days ago.  Pt woke up 1 wk ago and noticed VA OS was extremely blurred, and there was a "black spot" in her superior vision OS.  Symptoms have been constant ever since.  No problems reported OD.  Denies pain, flashes.  Seems to have a few floaters OS.  Nothing like this has ever happened before, and nothing seems to have triggered it.  Pt has done nothing to attempt to treat.  Allergy gtts prn OU.  BS unknown due to meter being broken.  A1C 11.0 about 1 month ago.       Last edited by Bernarda Caffey, MD on 06/01/2019 11:36 AM. (History)    pt states last Monday she woke up and her left eye had no vision, she states when she woke up at the top of her vision was a black dot, she states it feels like she has a film over her eye, she states she can see out of her temporal vision, but not her nasal vision, she states she feels like the black spot seems bigger at times, she states it has not had a red tint to it, pt was in the hospital last November she saw a black squiggly line in her vision but it went away after 3-4 days, pt takes medication for blood pressure and diabetes, she states she lost her meter to check her blood sugar, but has an appt to get a new one tomorrow, she states her last A1c was 11 in May 2020, she states she  does not check her blood pressure, pt saw Dr. Maryjane Hurter last Thursday bc of the low vision in her left eye, she states prior to that she had never seen an eye dr or retina specialist  Referring physician: Marin Comment [place], [place],  [place] [id number]  HISTORICAL INFORMATION:   Selected notes from the MEDICAL RECORD NUMBER Referred by Dr. Maryjane Hurter for concern of diabetic retinopathy LEE: 07.02.20 (M. Le) [BCVA: OD: 20/40 OS: LP] Ocular Hx-retinal traction, CME OS, optic nerve edema OS PMH-DM (A1c: 13.0 (12.02.18), takes tresiba/novolog), ashthma   CURRENT MEDICATIONS: Current Outpatient Medications (Ophthalmic Drugs)  Medication Sig  . prednisoLONE acetate (PRED FORTE) 1 % ophthalmic suspension Place 1 drop into the left eye 4 (four) times daily for 7 days.   No current facility-administered medications for this visit.  (Ophthalmic Drugs)   Current Outpatient Medications (Other)  Medication Sig  . ACCU-CHEK FASTCLIX LANCETS MISC USE TO TEST BLOOD SUGAR 5 TIMES DAILY  . albuterol (PROVENTIL HFA;VENTOLIN HFA) 108 (90 Base) MCG/ACT inhaler Inhale 2 puffs into the lungs every 6 (six) hours as needed for wheezing or shortness of  breath.  . blood glucose meter kit and supplies Dispense based on patient and insurance preference. Use up to four times daily as directed. (FOR ICD-9 250.00, 250.01).  . Blood Glucose Monitoring Suppl (GLUCOCOM BLOOD GLUCOSE MONITOR) DEVI Check blood sugar as directed  . Cholecalciferol (VITAMIN D-1000 MAX ST) 25 MCG (1000 UT) tablet Take by mouth.  . cyclobenzaprine (FLEXERIL) 10 MG tablet Take 1 tablet (10 mg total) by mouth 3 (three) times daily as needed for muscle spasms.  . fluconazole (DIFLUCAN) 100 MG tablet Take 1 tablet (100 mg total) by mouth daily.  . fluticasone (FLONASE) 50 MCG/ACT nasal spray Place into the nose.  Marland Kitchen glucose blood (ACCU-CHEK GUIDE) test strip Use as instructed  . insulin aspart (NOVOLOG) 100 UNIT/ML injection Inject 35 Units into  the skin 3 (three) times daily with meals. (Patient taking differently: Inject 25-50 Units into the skin See admin instructions. 3-4 times daily.  "Sliding scale plus 20 units.")  . Insulin Degludec (TRESIBA FLEXTOUCH) 200 UNIT/ML SOPN Inject 100 Units into the skin every morning.  . Insulin Pen Needle (FIFTY50 PEN NEEDLES) 31G X 5 MM MISC USE ONE NEEDLE WITH EACH INSULIN INJECTION 6 TIMES PER DAY. E10.65  . linaclotide (LINZESS) 145 MCG CAPS capsule Take 145 mcg by mouth daily as needed (irritable bowel syndrome).  Marland Kitchen lisinopril (PRINIVIL,ZESTRIL) 10 MG tablet Take 1 tablet (10 mg total) by mouth daily.  Marland Kitchen loratadine (CLARITIN) 10 MG tablet Take 1 tablet (10 mg total) by mouth daily.  . metFORMIN (GLUCOPHAGE-XR) 500 MG 24 hr tablet Take by mouth.  . metoCLOPramide (REGLAN) 5 MG tablet Take 1 tablet (5 mg total) by mouth every 6 (six) hours as needed for nausea.  . pantoprazole (PROTONIX) 40 MG tablet Take 1 tablet (40 mg total) by mouth daily at 6 (six) AM.  . polyethylene glycol (MIRALAX / GLYCOLAX) packet Take 17 g by mouth daily.  . predniSONE (DELTASONE) 10 MG tablet Take 2 tablets (20 mg total) by mouth 2 (two) times daily.  Marland Kitchen senna-docusate (SENOKOT-S) 8.6-50 MG tablet Take 2 tablets by mouth 2 (two) times daily.  Marland Kitchen tiZANidine (ZANAFLEX) 4 MG tablet Take by mouth.  . traMADol (ULTRAM) 50 MG tablet Take 1 tablet (50 mg total) by mouth every 6 (six) hours as needed.  . gabapentin (NEURONTIN) 300 MG capsule Take 2 capsules (600 mg total) by mouth 2 (two) times daily for 30 days.   No current facility-administered medications for this visit.  (Other)      REVIEW OF SYSTEMS: ROS    Positive for: Gastrointestinal, Endocrine, Eyes   Negative for: Constitutional, Neurological, Skin, Genitourinary, Musculoskeletal, HENT, Cardiovascular, Respiratory, Psychiatric, Allergic/Imm, Heme/Lymph   Last edited by Matthew Folks, COA on 06/01/2019  9:28 AM. (History)       ALLERGIES Allergies   Allergen Reactions  . Morphine And Related Nausea And Vomiting    PAST MEDICAL HISTORY Past Medical History:  Diagnosis Date  . Asthma   . Cellulitis of left foot   . Diabetes mellitus without complication ([place])   . GERD (gastroesophageal reflux disease) 10/27/2017  . IBS (irritable bowel syndrome)   . RLS (restless legs syndrome) 10/27/2017   Past Surgical History:  Procedure Laterality Date  . 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 CESAREAN SECTION    . CHOLECYSTECTOMY    . CYST EXCISION    .  TONSILLECTOMY    . TOOTH EXTRACTION      FAMILY HISTORY Family History  Problem Relation Age of Onset  . Diabetes Mother   . Cancer Mother   . Irritable bowel syndrome Mother   . Hypertension Mother   . Sleep apnea Mother   . Diabetes Father   . Restless legs syndrome Father     SOCIAL HISTORY Social History   Tobacco Use  . Smoking status: Never Smoker  . Smokeless tobacco: Never Used  Substance Use Topics  . Alcohol use: No  . Drug use: No         OPHTHALMIC EXAM:  Base Eye Exam    Visual Acuity (Snellen - Linear)      Right Left   Dist cc 20/30 CF   Dist ph cc NI NI   Correction: Glasses       Tonometry (Tonopen, 9:53 AM)      Right Left   Pressure 17 18       Pupils      Dark Light Shape React APD   Right 4 3 Round Minimal None   Left 4 3 Round Minimal None       Visual Fields (Counting fingers)      Left Right     Full   Restrictions Total inferior temporal deficiency; Partial outer superior temporal deficiency        Extraocular Movement      Right Left    Full, Ortho Full, Ortho       Neuro/Psych    Oriented x3: Yes   Mood/Affect: Normal       Dilation    Both eyes: 1.0% Mydriacyl, 2.5% Phenylephrine @ 9:53 AM        Slit Lamp and Fundus Exam    Slit Lamp Exam      Right Left   Lids/Lashes  Dermatochalasis - upper lid, mild Meibomian gland dysfunction Dermatochalasis - upper lid, mild Meibomian gland dysfunction   Conjunctiva/Sclera White and quiet White and quiet   Cornea trace Punctate epithelial erosions Clear   Anterior Chamber deep, narrow temporal angle deep and clear   Iris Round and moderately dilated to 27m, No NVI Round and moderately dilated to 6.536m No NVI   Lens Clear Trace Cortical cataract   Vitreous mild Vitreous syneresis mild Vitreous syneresis, +RBC in anterior vitreous, diffuse subhyaloid hemorrhage temporally, old blood clot inferiorly turning white       Fundus Exam      Right Left   Disc Pink and Sharp, +fine NVD temporally and superiorly Sharp rim, +NVD/fibrosis   C/D Ratio 0.2 0.1   Macula Blunted foveal reflex, +central cystic changes, +ERM, scattered MA/IRH, +NVE along ST arcades obscured by subhyaloid heme   Vessels Vascular attenuation, Tortuous, +NV, +venus beading IN arcades fibrosis and NVE along ST arcades   Periphery Attached, scattered DBH/CWS nasally  temporal hemisphere obscured by VH/subhyaloid heme, Attached beyond subhyaloid heme, nasal quadrant clear, DBH/NV        Refraction    Wearing Rx      Sphere Cylinder Axis   Right -4.50 +1.75 018   Left -5.00 +1.00 124   Age: [age]yr Type: SVL       Manifest Refraction      Sphere Cylinder Axis Dist VA   Right -4.50 +2.25 018 20/30   Left -5.00 +1.00 124 20/CF          IMAGING AND PROCEDURES  Imaging and Procedures for '@TODAY' @  OCT, Retina - OU - Both Eyes       Right Eye Quality was good. Central Foveal Thickness: 356. Progression has no prior data. Findings include no SRF, intraretinal fluid, vitreomacular adhesion , epiretinal membrane, abnormal foveal contour.   Left Eye Quality was poor. Central Foveal Thickness: 713. Progression has no prior data. Findings include epiretinal membrane, preretinal fibrosis, vitreous traction (No detail of fovea visible due to vitreous  opacities, +fibrosis and traction around disc).   Notes *Images captured and stored on drive  Diagnosis / Impression:  OD: abnormal foveal contour; central cystic changes/+DME OS: No details of fovea/macula visible due to vitreous opacities, +fibrosis and traction around disc  Clinical management:  See below  Abbreviations: NFP - Normal foveal profile. CME - cystoid macular edema. PED - pigment epithelial detachment. IRF - intraretinal fluid. SRF - subretinal fluid. EZ - ellipsoid zone. ERM - epiretinal membrane. ORA - outer retinal atrophy. ORT - outer retinal tubulation. SRHM - subretinal hyper-reflective material        Fluorescein Angiography Optos (Transit OD)       Right Eye   Progression has no prior data. Early phase findings include vascular perfusion defect, retinal neovascularization, microaneurysm. Mid/Late phase findings include vascular perfusion defect, retinal neovascularization, microaneurysm, leakage, neovascularization disc.   Left Eye   Progression has no prior data. Early phase findings include retinal neovascularization, neovascularization disc, vascular perfusion defect, microaneurysm, blockage (Peripheral hypofluorescence from 1100-0600 from blockage [facility] Mid/Late phase findings include blockage, neovascularization disc, leakage, microaneurysm, retinal neovascularization, vascular perfusion defect (Persistent blockage from [facility]).   Notes Images stored on drive;   Impression: PDR OU Extensive neovascularization OU (OS > OD) Scattered patches of capillary nonperfusion and MA OU         Intravitreal Injection, Pharmacologic Agent - OS - Left Eye       Time Out 06/01/2019. 12:47 PM. Confirmed correct patient, procedure, site, and patient consented.   Anesthesia Topical anesthesia was used. Anesthetic medications included Lidocaine 2%, Proparacaine 0.5%.   Procedure Preparation included 5% betadine to ocular surface, eyelid speculum. A supplied  needle was used.   Injection:  1.25 mg Bevacizumab (AVASTIN) SOLN   NDC: 78295-621-30, Lot: [id number] , Expiration date: 08/05/2019   Route: Intravitreal, Site: Left Eye, Waste: 0 mL  Post-op Post injection exam found visual acuity of at least counting fingers. The patient tolerated the procedure well. There were no complications. The patient received written and verbal post procedure care education.        Panretinal Photocoagulation - OS - Left Eye       LASER PROCEDURE NOTE  Diagnosis:   Proliferative Diabetic Retinopathy, LEFT EYE  Procedure:  Pan-retinal photocoagulation using slit lamp laser, LEFT EYE  Anesthesia:  Topical  Surgeon: 22/07/2019, MD, PhD   Informed consent obtained, operative eye marked, and time out performed prior to initiation of laser.   Lumenis Bernarda Caffey slit lamp laser Pattern: 3x3 square Power: 280 mW Duration: 30 msec  Spot size: 200 microns  # spots: [id number] spots  Complications: None.  Notes: significant subhyaloid/vitreous heme obscuring view and preventing laser up take temporally, inferiorly and scattered focal areas  RTC: 1 wk  Patient tolerated the procedure well and received written and verbal post-procedure care information/education.                  ASSESSMENT/PLAN:    ICD-10-CM   1. Proliferative diabetic retinopathy of both eyes with macular edema associated with type 2 diabetes mellitus ([place])  X03.8333 Fluorescein Angiography Optos (Transit OD)    Intravitreal Injection, Pharmacologic Agent - OS - Left Eye    Panretinal Photocoagulation - OS - Left Eye    Bevacizumab (AVASTIN) SOLN 1.25 mg    CANCELED: Panretinal Photocoagulation - OD - Right Eye  2. Retinal edema  H35.81 OCT, Retina - OU - Both Eyes  3. Vitreous hemorrhage of left eye (HCC)  H43.12 Panretinal Photocoagulation - OS - Left Eye    CANCELED: Panretinal Photocoagulation - OD - Right Eye  4. Essential hypertension  I10   5. Hypertensive retinopathy  of both eyes  H35.033 Fluorescein Angiography Optos (Transit OD)    1-3. Proliferative diabetic retinopathy with macular edema OU (OS > OD)  - OS with acute decrease in vision from vitreous/subhyaloid heme  - The incidence, risk factors for progression, natural history and treatment options for diabetic retinopathy were discussed with patient.    - The need for close monitoring of blood glucose, blood pressure, and serum lipids, avoiding cigarette or any type of tobacco, and the need for long term follow up was also discussed with patient.  - exam shows large preretinal/subhyaloid heme OS and scattered fibrosis w/ NV; OD with scattered intraretinal DBH and flat NV  - FA today (07.06.20) shows NV and capillary nonperfusion OU  - OCT shows no details of fovea visible due to vitreous opacities, +fibrosis and traction around disc OS; OD with mild central cystic changes / DME  - discussed findings and prognosis  - recommend PRP + IVA OU, OS first today  - pt wishes to proceed with laser treatment and IVA OS #1 today 07.06.20  - RBA of procedure discussed, questions answered  - informed consent obtained and signed  - see procedure notes  - start PF QID OS x7 days  - VH precautions reviewed -- minimize activities, keep head elevated, avoid ASA/NSAIDs/blood thinners as   - f/u in 1 wk for PRP/IVA OD  4,5. Hypertensive retinopathy OU  - discussed importance of tight BP control  - monitor     Ophthalmic Meds Ordered this visit:  Meds ordered this encounter  Medications  . prednisoLONE acetate (PRED FORTE) 1 % ophthalmic suspension    Sig: Place 1 drop into the left eye 4 (four) times daily for 7 days.    Dispense:  10 mL    Refill:  1  . Bevacizumab (AVASTIN) SOLN 1.25 mg       Return in about 1 week (around 06/08/2019) for f/u PDR OU, DFE, OCT.  There are no Patient Instructions on file for this visit.   Explained the diagnoses, plan, and follow up with the patient and they  expressed understanding.  Patient expressed understanding of the importance of proper follow up care.   This document serves as a record of services personally performed by Gardiner Sleeper, MD, PhD. It was created on their behalf by Ernest Mallick, OA, an ophthalmic assistant. The creation of this record is the provider's dictation and/or activities during the visit.    Electronically signed by: Ernest Mallick, OA  07.05.2020 1:17 PM    Gardiner Sleeper, M.D., Ph.D. Diseases & Surgery of the Retina and Vitreous [facility]  I have reviewed the above documentation for accuracy and completeness, and I agree with the above. Gardiner Sleeper, M.D., Ph.D. 06/01/19 1:20 PM    Abbreviations: M myopia (nearsighted); A astigmatism; H hyperopia (farsighted); P presbyopia; Mrx spectacle prescription;  CTL contact lenses;  OD right eye; OS left eye; OU both eyes  XT exotropia; ET esotropia; PEK punctate epithelial keratitis; PEE punctate epithelial erosions; DES dry eye syndrome; MGD meibomian gland dysfunction; ATs artificial tears; PFAT's preservative free artificial tears; [place] nuclear sclerotic cataract; PSC posterior subcapsular cataract; ERM epi-retinal membrane; PVD posterior vitreous detachment; RD retinal detachment; DM diabetes mellitus; DR diabetic retinopathy; NPDR non-proliferative diabetic retinopathy; PDR proliferative diabetic retinopathy; CSME clinically significant macular edema; DME diabetic macular edema; dbh dot blot hemorrhages; CWS cotton wool spot; POAG primary open angle glaucoma; C/D cup-to-disc ratio; HVF humphrey visual field; GVF goldmann visual field; OCT optical coherence tomography; IOP intraocular pressure; BRVO Branch retinal vein occlusion; CRVO central retinal vein occlusion; CRAO central retinal artery occlusion; BRAO branch retinal artery occlusion; RT retinal tear; SB scleral buckle; PPV pars plana vitrectomy; VH Vitreous hemorrhage; PRP panretinal laser  photocoagulation; IVK intravitreal kenalog; VMT vitreomacular traction; MH Macular hole;  NVD neovascularization of the disc; NVE neovascularization elsewhere; AREDS age related eye disease study; ARMD age related macular degeneration; POAG primary open angle glaucoma; EBMD epithelial/anterior basement membrane dystrophy; ACIOL anterior chamber intraocular lens; IOL intraocular lens; PCIOL posterior chamber intraocular lens; Phaco/IOL phacoemulsification with intraocular lens placement; [place] photorefractive keratectomy; LASIK laser assisted in situ keratomileusis; HTN hypertension; DM diabetes mellitus; COPD chronic obstructive pulmonary disease

## 2019-06-01 ENCOUNTER — Other Ambulatory Visit: Payer: Self-pay

## 2019-06-01 ENCOUNTER — Encounter (INDEPENDENT_AMBULATORY_CARE_PROVIDER_SITE_OTHER): Payer: Self-pay | Admitting: Ophthalmology

## 2019-06-01 ENCOUNTER — Ambulatory Visit (INDEPENDENT_AMBULATORY_CARE_PROVIDER_SITE_OTHER): Payer: Medicaid Other | Admitting: Ophthalmology

## 2019-06-01 DIAGNOSIS — H3581 Retinal edema: Secondary | ICD-10-CM

## 2019-06-01 DIAGNOSIS — I10 Essential (primary) hypertension: Secondary | ICD-10-CM

## 2019-06-01 DIAGNOSIS — E113513 Type 2 diabetes mellitus with proliferative diabetic retinopathy with macular edema, bilateral: Secondary | ICD-10-CM | POA: Diagnosis not present

## 2019-06-01 DIAGNOSIS — H35033 Hypertensive retinopathy, bilateral: Secondary | ICD-10-CM

## 2019-06-01 DIAGNOSIS — H4312 Vitreous hemorrhage, left eye: Secondary | ICD-10-CM | POA: Diagnosis not present

## 2019-06-01 MED ORDER — BEVACIZUMAB CHEMO INJECTION 1.25MG/0.05ML SYRINGE FOR KALEIDOSCOPE
1.2500 mg | INTRAVITREAL | Status: AC | PRN
  Administered 2019-06-01: 1.25 mg via INTRAVITREAL

## 2019-06-01 MED ORDER — PREDNISOLONE ACETATE 1 % OP SUSP: 1.0000 [drp] | Freq: Four times a day (QID) | OPHTHALMIC | 1 refills | Status: AC

## 2019-06-09 ENCOUNTER — Encounter (INDEPENDENT_AMBULATORY_CARE_PROVIDER_SITE_OTHER): Payer: Medicaid Other | Admitting: Ophthalmology

## 2019-06-12 ENCOUNTER — Encounter (INDEPENDENT_AMBULATORY_CARE_PROVIDER_SITE_OTHER): Payer: Medicaid Other | Admitting: Ophthalmology

## 2019-06-28 NOTE — Progress Notes (Signed)
[facility] Note  06/29/2019     CHIEF COMPLAINT Patient presents for Retina Follow Up   HISTORY OF PRESENT ILLNESS: Lindsay Love is a 38 y.o. female who presents to the clinic today for:   HPI    Retina Follow Up    Patient presents with  Diabetic Retinopathy.  In both eyes.  This started weeks ago.  Severity is moderate.  Duration of weeks.  Since onset it is stable.  I, the attending physician,  performed the HPI with the patient and updated documentation appropriately.          Comments    Patient states black spot is improving in her left eye but vision is still very cloudy.  She denies eye pain or discomfort and denies any new or worsening floaters or fol OU.       Last edited by Bernarda Caffey, MD on 06/29/2019  1:58 PM. (History)    pt states she missed her last appt bc she was sick, she states her left eye is still foggy, she states most of the black is gone, but there is still a little bit there, she states if she tilts her head back she can see the blood, but then it floats back down  Referring physician: Debbora Lacrosse, [place],  [place] [address]  HISTORICAL INFORMATION:   Selected notes from the [facility] Referred by Dr. Maryjane Hurter for concern of diabetic retinopathy LEE: 07.02.20 (M. Le) [BCVA: OD: 20/40 OS: LP] Ocular Hx-retinal traction, CME OS, optic nerve edema OS PMH-DM (A1c: 13.0 (12.02.18), takes tresiba/novolog), ashthma   CURRENT MEDICATIONS: Current Outpatient Medications (Ophthalmic Drugs)  Medication Sig  . prednisoLONE acetate (PRED FORTE) 1 % ophthalmic suspension Place 1 drop into the right eye 4 (four) times daily for 7 days.   No current facility-administered medications for this visit.  (Ophthalmic Drugs)   Current Outpatient Medications (Other)  Medication Sig  . ACCU-CHEK FASTCLIX LANCETS MISC USE TO TEST BLOOD SUGAR 5 TIMES DAILY  . albuterol (PROVENTIL  HFA;VENTOLIN HFA) 108 (90 Base) MCG/ACT inhaler Inhale 2 puffs into the lungs every 6 (six) hours as needed for wheezing or shortness of breath.  . blood glucose meter kit and supplies Dispense based on patient and insurance preference. Use up to four times daily as directed. (FOR ICD-9 250.00, 250.01).  . Blood Glucose Monitoring Suppl (GLUCOCOM BLOOD GLUCOSE MONITOR) DEVI Check blood sugar as directed  . Cholecalciferol (VITAMIN D-1000 MAX ST) 25 MCG (1000 UT) tablet Take by mouth.  . cyclobenzaprine (FLEXERIL) 10 MG tablet Take 1 tablet (10 mg total) by mouth 3 (three) times daily as needed for muscle spasms.  . fluconazole (DIFLUCAN) 100 MG tablet Take 1 tablet (100 mg total) by mouth daily.  . fluticasone (FLONASE) 50 MCG/ACT nasal spray Place into the nose.  . gabapentin (NEURONTIN) 300 MG capsule Take 2 capsules (600 mg total) by mouth 2 (two) times daily for 30 days.  Marland Kitchen glucose blood (ACCU-CHEK GUIDE) test strip Use as instructed  . insulin aspart (NOVOLOG) 100 UNIT/ML injection Inject 35 Units into the skin 3 (three) times daily with meals. (Patient taking differently: Inject 25-50 Units into the skin See admin instructions. 3-4 times daily.  "Sliding scale plus 20 units.")  . Insulin Degludec (TRESIBA FLEXTOUCH) 200 UNIT/ML SOPN Inject 100 Units into the skin every morning.  . Insulin Pen Needle (FIFTY50 PEN NEEDLES) 31G X 5 MM MISC  USE ONE NEEDLE WITH EACH INSULIN INJECTION 6 TIMES PER DAY. E10.65  . linaclotide (LINZESS) 145 MCG CAPS capsule Take 145 mcg by mouth daily as needed (irritable bowel syndrome).  Marland Kitchen lisinopril (PRINIVIL,ZESTRIL) 10 MG tablet Take 1 tablet (10 mg total) by mouth daily.  Marland Kitchen loratadine (CLARITIN) 10 MG tablet Take 1 tablet (10 mg total) by mouth daily.  . metFORMIN (GLUCOPHAGE-XR) 500 MG 24 hr tablet Take by mouth.  . metoCLOPramide (REGLAN) 5 MG tablet Take 1 tablet (5 mg total) by mouth every 6 (six) hours as needed for nausea.  . pantoprazole (PROTONIX) 40 MG  tablet Take 1 tablet (40 mg total) by mouth daily at 6 (six) AM.  . polyethylene glycol (MIRALAX / GLYCOLAX) packet Take 17 g by mouth daily.  . predniSONE (DELTASONE) 10 MG tablet Take 2 tablets (20 mg total) by mouth 2 (two) times daily.  Marland Kitchen senna-docusate (SENOKOT-S) 8.6-50 MG tablet Take 2 tablets by mouth 2 (two) times daily.  Marland Kitchen tiZANidine (ZANAFLEX) 4 MG tablet Take by mouth.  . traMADol (ULTRAM) 50 MG tablet Take 1 tablet (50 mg total) by mouth every 6 (six) hours as needed.   No current facility-administered medications for this visit.  (Other)      REVIEW OF SYSTEMS: ROS    Positive for: Gastrointestinal, Endocrine, Eyes   Negative for: Constitutional, Neurological, Skin, Genitourinary, Musculoskeletal, HENT, Cardiovascular, Respiratory, Psychiatric, Allergic/Imm, Heme/Lymph   Last edited by Doneen Poisson on 06/29/2019  1:49 PM. (History)       ALLERGIES Allergies  Allergen Reactions  . Morphine And Related Nausea And Vomiting    PAST MEDICAL HISTORY Past Medical History:  Diagnosis Date  . Asthma   . Cellulitis of left foot   . Diabetes mellitus without complication ([place])   . GERD (gastroesophageal reflux disease) 10/27/2017  . IBS (irritable bowel syndrome)   . RLS (restless legs syndrome) 10/27/2017   Past Surgical History:  Procedure Laterality Date  . 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 CESAREAN SECTION    . CHOLECYSTECTOMY    . CYST EXCISION    . TONSILLECTOMY    . TOOTH EXTRACTION      FAMILY HISTORY Family History  Problem Relation Age of Onset  . Diabetes Mother   . Cancer Mother   . Irritable bowel syndrome Mother   . Hypertension Mother   . Sleep apnea Mother   . Diabetes Father   . Restless legs syndrome Father     SOCIAL HISTORY Social History   Tobacco Use  . Smoking status: Never Smoker  . Smokeless  tobacco: Never Used  Substance Use Topics  . Alcohol use: No  . Drug use: No         OPHTHALMIC EXAM:  Base Eye Exam    Visual Acuity (Snellen - Linear)      Right Left   Dist [place] 20/250 -1 CF   Dist ph [place] 20/40 NI       Tonometry (Tonopen, 1:54 PM)      Right Left   Pressure 20 21       Pupils      Dark Light Shape React APD   Right 4 3 Round Brisk 0   Left 4 3 Round Brisk 0       Extraocular Movement      Right Left  Full Full       Neuro/Psych    Oriented x3: Yes   Mood/Affect: Normal       Dilation    Both eyes: 1.0% Mydriacyl, 2.5% Phenylephrine @ 1:54 PM        Slit Lamp and Fundus Exam    Slit Lamp Exam      Right Left   Lids/Lashes Dermatochalasis - upper lid, mild Meibomian gland dysfunction Dermatochalasis - upper lid, mild Meibomian gland dysfunction   Conjunctiva/Sclera White and quiet White and quiet   Cornea trace Punctate epithelial erosions Clear   Anterior Chamber deep, narrow temporal angle deep and clear   Iris Round and moderately dilated to 7m, No NVI Round and moderately dilated to 6.58m No NVI   Lens Clear Trace Cortical cataract   Vitreous mild Vitreous syneresis mild Vitreous syneresis, +RBC in anterior vitreous, diffuse subhyaloid hemorrhage temporally, old blood clot inferiorly turning white       Fundus Exam      Right Left   Disc Pink and Sharp, +fine NVD temporally and superiorly no view, obscured by fibrosis   C/D Ratio 0.2    Macula Blunted foveal reflex, +central cystic changes, +ERM, scattered MA/IRH, +NVE along ST arcades, new pre-retinal / sub-hyaloid heme centrally obscured by subhyaloid heme and increased fibrosis, ?progression to TRD   Vessels Vascular attenuation, Tortuous, +NV, +venus beading IN arcades, fine NVE along ST arcades Increase in fibrosis and NVE along ST arcades   Periphery Attached, scattered DBH/CWS nasally  360 diffuse subhyaloid heme -- increased from prior; increased posterior fibrosis  ?interval crunch        Refraction    Wearing Rx   Forgot to bring glasses          IMAGING AND PROCEDURES  Imaging and Procedures for _0 @  OCT, Retina - OU - Both Eyes       Right Eye Quality was good. Central Foveal Thickness: 345. Progression has been stable. Findings include no SRF, intraretinal fluid, vitreomacular adhesion , epiretinal membrane, abnormal foveal contour (Interval improvement in IRF/foveal profile; interval development in perifoveal sub-hyaloid heme).   Left Eye Quality was poor. Progression has no prior data. Findings include epiretinal membrane, preretinal fibrosis, vitreous traction, subretinal hyper-reflective material (No detail of fovea visible due to vitreous opacities, +fibrosis and traction around disc; ?tractional detachement).   Notes *Images captured and stored on drive  Diagnosis / Impression:  OD: abnormal foveal contour; Interval improvement in IRF/foveal profile; interval development in perifoveal sub-hyaloid heme OS: No details of fovea/macula visible due to vitreous opacities, +fibrosis and traction around disc;?tractional detachement    Clinical management:  See below  Abbreviations: NFP - Normal foveal profile. CME - cystoid macular edema. PED - pigment epithelial detachment. IRF - intraretinal fluid. SRF - subretinal fluid. EZ - ellipsoid zone. ERM - epiretinal membrane. ORA - outer retinal atrophy. ORT - outer retinal tubulation. SRHM - subretinal hyper-reflective material        Panretinal Photocoagulation - OD - Right Eye       LASER PROCEDURE NOTE  Diagnosis:   Proliferative Diabetic Retinopathy, Right EYE  Procedure:  Pan-retinal photocoagulation using slit lamp laser, Right EYE  Anesthesia:  Topical  Surgeon: BrBernarda CaffeyMD, PhD   Informed consent obtained, operative eye marked, and time out performed prior to initiation of laser.   Lumenis Smart532 slit lamp laser Pattern: 2x2 square, 3x3 square Power:  290 mW Duration: 30 msec  Spot size: 200 microns  # spots:  [id number] spots  Complications: None.  Notes: significant vitreous heme and cortical cataract obscuring view and preventing laser up take in scattered focal areas  RTC: 2 wks  Patient tolerated the procedure well and received written and verbal post-procedure care information/education.         Intravitreal Injection, Pharmacologic Agent - OD - Right Eye       Time Out 06/29/2019. 2:52 PM. Confirmed correct patient, procedure, site, and patient consented.   Anesthesia Topical anesthesia was used. Anesthetic medications included Lidocaine 2%, Proparacaine 0.5%.   Procedure Preparation included 5% betadine to ocular surface, eyelid speculum. A supplied needle was used.   Injection:  1.25 mg Bevacizumab (AVASTIN) SOLN   NDC: 38329-191-66, Lot: [id number] _[id number] , Expiration date: 08/27/2019   Route: Intravitreal, Site: Right Eye, Waste: 0 mL  Post-op Post injection exam found visual acuity of at least counting fingers. The patient tolerated the procedure well. There were no complications. The patient received written and verbal post procedure care education.        Intravitreal Injection, Pharmacologic Agent - OS - Left Eye       Time Out 06/29/2019. 2:53 PM. Confirmed correct patient, procedure, site, and patient consented.   Anesthesia Topical anesthesia was used. Anesthetic medications included Lidocaine 2%, Proparacaine 0.5%.   Procedure Preparation included 5% betadine to ocular surface, eyelid speculum. A supplied needle was used.   Injection:  1.25 mg Bevacizumab (AVASTIN) SOLN   NDC: 06004-599-77, Lot: [id number] , Expiration date: 08/05/2019   Route: Intravitreal, Site: Left Eye, Waste: 0 mL  Post-op Post injection exam found visual acuity of at least counting fingers. The patient tolerated the procedure well. There were no complications. The patient received written and verbal post procedure care education.                  ASSESSMENT/PLAN:    ICD-10-CM   1. Proliferative diabetic retinopathy of both eyes with macular edema associated with type 2 diabetes mellitus (HCC)  S14.2395 Panretinal Photocoagulation - OD - Right Eye    Intravitreal Injection, Pharmacologic Agent - OD - Right Eye    Intravitreal Injection, Pharmacologic Agent - OS - Left Eye    Bevacizumab (AVASTIN) SOLN 1.25 mg    Bevacizumab (AVASTIN) SOLN 1.25 mg  2. Retinal edema  H35.81 OCT, Retina - OU - Both Eyes  3. Vitreous hemorrhage of left eye (HCC)  H43.12   4. Essential hypertension  I10   5. Hypertensive retinopathy of both eyes  H35.033     1-3. Proliferative diabetic retinopathy with macular edema OU (OS > OD)  - OS with acute decrease in vision from vitreous/subhyaloid heme  - initial exam shows large preretinal/subhyaloid heme OS and scattered fibrosis w/ NV; OD with scattered intraretinal DBH and flat NV  - The incidence, risk factors for progression, natural history and treatment options for diabetic retinopathy were discussed with patient.    - The need for close monitoring of blood glucose, blood pressure, and serum lipids, avoiding cigarette or any type of tobacco, and the need for long term follow up was also discussed with patient.  - FA (07.06.20) shows NV and capillary nonperfusion OU  - s/p IVA OS (07.06.20)  - s/p PRP OS (07.06.20)  - exam today shows interval increase in subhyaloid heme and posterior fibrosis OS ?crunch; OD with new subhyaloid / preretinal heme centrally  - OCT shows no details of fovea visible due to vitreous opacities, +fibrosis and traction around disc OS -- ?increased traction; OD  with persistent central cystic changes / DME  - discussed findings and prognosis -- OS may need PPV to clear VH and remove tractional fibrosis and repair TRD.  - recommend PRP OD + IVA OU, today (08.03.20)  - pt wishes to proceed with laser treatment OD and IVA OU  - RBA of procedure discussed,  questions answered  - informed consent obtained and signed  - see procedure notes  - start PF QID OD x7 days  - VH precautions reviewed -- minimize activities, keep head elevated, avoid ASA/NSAIDs/blood thinners as   - f/u 2 weeks -- DFE/OCT/ possible surgical planning/scheduling OS  4,5. Hypertensive retinopathy OU  - discussed importance of tight BP control  - monitor     Ophthalmic Meds Ordered this visit:  Meds ordered this encounter  Medications  . prednisoLONE acetate (PRED FORTE) 1 % ophthalmic suspension    Sig: Place 1 drop into the right eye 4 (four) times daily for 7 days.    Dispense:  10 mL    Refill:  0  . Bevacizumab (AVASTIN) SOLN 1.25 mg  . Bevacizumab (AVASTIN) SOLN 1.25 mg       Return in about 2 weeks (around 07/13/2019) for f/u PDR OU, DFE, OCT.  There are no Patient Instructions on file for this visit.   Explained the diagnoses, plan, and follow up with the patient and they expressed understanding.  Patient expressed understanding of the importance of proper follow up care.   This document serves as a record of services personally performed by Gardiner Sleeper, MD, PhD. It was created on their behalf by Ernest Mallick, OA, an ophthalmic assistant. The creation of this record is the provider's dictation and/or activities during the visit.    Electronically signed by: Ernest Mallick, OA  08.02.2020 4:33 PM   Gardiner Sleeper, M.D., Ph.D. Diseases & Surgery of the Retina and Vitreous [facility]  I have reviewed the above documentation for accuracy and completeness, and I agree with the above. Gardiner Sleeper, M.D., Ph.D. 06/29/19 4:33 PM    Abbreviations: M myopia (nearsighted); A astigmatism; H hyperopia (farsighted); P presbyopia; Mrx spectacle prescription;  CTL contact lenses; OD right eye; OS left eye; OU both eyes  XT exotropia; ET esotropia; PEK punctate epithelial keratitis; PEE punctate epithelial erosions; DES dry eye  syndrome; MGD meibomian gland dysfunction; ATs artificial tears; PFAT's preservative free artificial tears; [place] nuclear sclerotic cataract; PSC posterior subcapsular cataract; ERM epi-retinal membrane; PVD posterior vitreous detachment; RD retinal detachment; DM diabetes mellitus; DR diabetic retinopathy; NPDR non-proliferative diabetic retinopathy; PDR proliferative diabetic retinopathy; CSME clinically significant macular edema; DME diabetic macular edema; dbh dot blot hemorrhages; CWS cotton wool spot; POAG primary open angle glaucoma; C/D cup-to-disc ratio; HVF humphrey visual field; GVF goldmann visual field; OCT optical coherence tomography; IOP intraocular pressure; BRVO Branch retinal vein occlusion; CRVO central retinal vein occlusion; CRAO central retinal artery occlusion; BRAO branch retinal artery occlusion; RT retinal tear; SB scleral buckle; PPV pars plana vitrectomy; VH Vitreous hemorrhage; PRP panretinal laser photocoagulation; IVK intravitreal kenalog; VMT vitreomacular traction; MH Macular hole;  NVD neovascularization of the disc; NVE neovascularization elsewhere; AREDS age related eye disease study; ARMD age related macular degeneration; POAG primary open angle glaucoma; EBMD epithelial/anterior basement membrane dystrophy; ACIOL anterior chamber intraocular lens; IOL intraocular lens; PCIOL posterior chamber intraocular lens; Phaco/IOL phacoemulsification with intraocular lens placement; [place] photorefractive keratectomy; LASIK laser assisted in situ keratomileusis; HTN hypertension; DM diabetes mellitus; COPD chronic obstructive  pulmonary disease

## 2019-06-29 ENCOUNTER — Other Ambulatory Visit: Payer: Self-pay

## 2019-06-29 ENCOUNTER — Encounter (INDEPENDENT_AMBULATORY_CARE_PROVIDER_SITE_OTHER): Payer: Self-pay | Admitting: Ophthalmology

## 2019-06-29 ENCOUNTER — Ambulatory Visit (INDEPENDENT_AMBULATORY_CARE_PROVIDER_SITE_OTHER): Payer: Medicaid Other | Admitting: Ophthalmology

## 2019-06-29 DIAGNOSIS — H4312 Vitreous hemorrhage, left eye: Secondary | ICD-10-CM

## 2019-06-29 DIAGNOSIS — H3581 Retinal edema: Secondary | ICD-10-CM

## 2019-06-29 DIAGNOSIS — H35033 Hypertensive retinopathy, bilateral: Secondary | ICD-10-CM

## 2019-06-29 DIAGNOSIS — E113513 Type 2 diabetes mellitus with proliferative diabetic retinopathy with macular edema, bilateral: Secondary | ICD-10-CM

## 2019-06-29 DIAGNOSIS — I10 Essential (primary) hypertension: Secondary | ICD-10-CM

## 2019-06-29 MED ORDER — BEVACIZUMAB CHEMO INJECTION 1.25MG/0.05ML SYRINGE FOR KALEIDOSCOPE
1.2500 mg | INTRAVITREAL | Status: AC | PRN
  Administered 2019-06-29: 1.25 mg via INTRAVITREAL

## 2019-06-29 MED ORDER — PREDNISOLONE ACETATE 1 % OP SUSP: 1.0000 [drp] | Freq: Four times a day (QID) | OPHTHALMIC | 0 refills | Status: AC

## 2019-07-13 ENCOUNTER — Encounter (INDEPENDENT_AMBULATORY_CARE_PROVIDER_SITE_OTHER): Payer: Self-pay | Admitting: Ophthalmology

## 2019-07-13 ENCOUNTER — Ambulatory Visit (INDEPENDENT_AMBULATORY_CARE_PROVIDER_SITE_OTHER): Payer: Medicaid Other | Admitting: Ophthalmology

## 2019-07-13 ENCOUNTER — Other Ambulatory Visit: Payer: Self-pay

## 2019-07-13 DIAGNOSIS — E113513 Type 2 diabetes mellitus with proliferative diabetic retinopathy with macular edema, bilateral: Secondary | ICD-10-CM | POA: Diagnosis not present

## 2019-07-13 DIAGNOSIS — H3581 Retinal edema: Secondary | ICD-10-CM

## 2019-07-13 DIAGNOSIS — H35033 Hypertensive retinopathy, bilateral: Secondary | ICD-10-CM

## 2019-07-13 DIAGNOSIS — H4312 Vitreous hemorrhage, left eye: Secondary | ICD-10-CM | POA: Diagnosis not present

## 2019-07-13 DIAGNOSIS — H3342 Traction detachment of retina, left eye: Secondary | ICD-10-CM | POA: Diagnosis not present

## 2019-07-13 DIAGNOSIS — I10 Essential (primary) hypertension: Secondary | ICD-10-CM

## 2019-07-13 NOTE — Progress Notes (Signed)
[facility] Note  07/13/2019     CHIEF COMPLAINT Patient presents for Retina Follow Up   HISTORY OF PRESENT ILLNESS: Lindsay Love is a 38 y.o. female who presents to the clinic today for:   HPI    Retina Follow Up    Patient presents with  Diabetic Retinopathy.  In both eyes.  This started 6 weeks ago.  Severity is severe.  Duration of 2 weeks.  Since onset it is stable.  I, the attending physician,  performed the HPI with the patient and updated documentation appropriately.          Comments    38 y/o female pt here for 2 wk f/u for PDR OU.  No change in [place] OU.  Denies pain, flashes, but sees intermittent "halos" in her central vision OU.  Believes the halos may have something to do with the type of lighting; sees them currently.  Has been having more frequent headaches, and attributes them to eye strain OS.  No gtts.  BS 202 this morning before using her insulin.  A1C in the 8s about 1 month ago.       Last edited by Bernarda Caffey, MD on 07/13/2019  3:24 PM. (History)    pt states her left eye vision has gotten worse since last visit   Referring physician: Debbora Lacrosse, [place],  [place] [address]  HISTORICAL INFORMATION:   Selected notes from the [facility] Referred by Dr. Maryjane Hurter for concern of diabetic retinopathy LEE: 07.02.20 (M. Le) [BCVA: OD: 20/40 OS: LP] Ocular Hx-retinal traction, CME OS, optic nerve edema OS PMH-DM (A1c: 13.0 (12.02.18), takes tresiba/novolog), ashthma   CURRENT MEDICATIONS: No current outpatient medications on file. (Ophthalmic Drugs)   No current facility-administered medications for this visit.  (Ophthalmic Drugs)   Current Outpatient Medications (Other)  Medication Sig  . ACCU-CHEK FASTCLIX LANCETS MISC USE TO TEST BLOOD SUGAR 5 TIMES DAILY  . albuterol (PROVENTIL HFA;VENTOLIN HFA) 108 (90 Base) MCG/ACT inhaler Inhale 2 puffs into the lungs every 6 (six)  hours as needed for wheezing or shortness of breath.  . blood glucose meter kit and supplies Dispense based on patient and insurance preference. Use up to four times daily as directed. (FOR ICD-9 250.00, 250.01).  . Blood Glucose Monitoring Suppl (GLUCOCOM BLOOD GLUCOSE MONITOR) DEVI Check blood sugar as directed  . Cholecalciferol (VITAMIN D-1000 MAX ST) 25 MCG (1000 UT) tablet Take by mouth.  . cyclobenzaprine (FLEXERIL) 10 MG tablet Take 1 tablet (10 mg total) by mouth 3 (three) times daily as needed for muscle spasms.  . fluconazole (DIFLUCAN) 100 MG tablet Take 1 tablet (100 mg total) by mouth daily.  . fluticasone (FLONASE) 50 MCG/ACT nasal spray Place into the nose.  Marland Kitchen glucose blood (ACCU-CHEK GUIDE) test strip Use as instructed  . hydrochlorothiazide (HYDRODIURIL) 12.5 MG tablet TAKE 1 TABLET (12.5 MG TOTAL) BY MOUTH DAILY. FOR BLOOD PRESSURE  . insulin aspart (NOVOLOG) 100 UNIT/ML injection Inject 35 Units into the skin 3 (three) times daily with meals. (Patient taking differently: Inject 25-50 Units into the skin See admin instructions. 3-4 times daily.  "Sliding scale plus 20 units.")  . Insulin Degludec (TRESIBA FLEXTOUCH) 200 UNIT/ML SOPN Inject 100 Units into the skin every morning.  . Insulin Pen Needle (FIFTY50 PEN NEEDLES) 31G X 5 MM MISC USE ONE NEEDLE WITH EACH INSULIN INJECTION 6 TIMES PER DAY. E10.65  . linaclotide (LINZESS)  145 MCG CAPS capsule Take 145 mcg by mouth daily as needed (irritable bowel syndrome).  Marland Kitchen lisinopril (PRINIVIL,ZESTRIL) 10 MG tablet Take 1 tablet (10 mg total) by mouth daily.  Marland Kitchen lisinopril (ZESTRIL) 30 MG tablet Take 30 mg by mouth daily.  Marland Kitchen loratadine (CLARITIN) 10 MG tablet Take 1 tablet (10 mg total) by mouth daily.  . metFORMIN (GLUCOPHAGE-XR) 500 MG 24 hr tablet Take by mouth.  . metoCLOPramide (REGLAN) 5 MG tablet Take 1 tablet (5 mg total) by mouth every 6 (six) hours as needed for nausea.  Marland Kitchen omeprazole (PRILOSEC) 20 MG capsule TAKE 1 CAPSULE (20  MG TOTAL) BY MOUTH DAILY BEFORE BREAKFAST. FOR HEART BURN  . pantoprazole (PROTONIX) 40 MG tablet Take 1 tablet (40 mg total) by mouth daily at 6 (six) AM.  . polyethylene glycol (MIRALAX / GLYCOLAX) packet Take 17 g by mouth daily.  . predniSONE (DELTASONE) 10 MG tablet Take 2 tablets (20 mg total) by mouth 2 (two) times daily.  Marland Kitchen senna-docusate (SENOKOT-S) 8.6-50 MG tablet Take 2 tablets by mouth 2 (two) times daily.  Marland Kitchen tiZANidine (ZANAFLEX) 4 MG tablet Take by mouth.  . traMADol (ULTRAM) 50 MG tablet Take 1 tablet (50 mg total) by mouth every 6 (six) hours as needed.  . gabapentin (NEURONTIN) 300 MG capsule Take 2 capsules (600 mg total) by mouth 2 (two) times daily for 30 days.   No current facility-administered medications for this visit.  (Other)      REVIEW OF SYSTEMS: ROS    Positive for: Endocrine, Eyes   Negative for: Constitutional, Gastrointestinal, Neurological, Skin, Genitourinary, Musculoskeletal, HENT, Cardiovascular, Respiratory, Psychiatric, Allergic/Imm, Heme/Lymph   Last edited by Matthew Folks, COA on 07/13/2019  2:02 PM. (History)       ALLERGIES Allergies  Allergen Reactions  . Morphine And Related Nausea And Vomiting    PAST MEDICAL HISTORY Past Medical History:  Diagnosis Date  . Asthma   . Cellulitis of left foot   . Diabetes mellitus without complication ([place])   . Diabetic retinopathy ([place])    PDR OU  . GERD (gastroesophageal reflux disease) 10/27/2017  . Hypertensive retinopathy    OU  . IBS (irritable bowel syndrome)   . RLS (restless legs syndrome) 10/27/2017   Past Surgical History:  Procedure Laterality Date  . AMPUTATION Right 11/01/2017   Procedure: RIGHT FOOT 5TH RAY AMPUTATION;  Surgeon: Newt Minion, MD;  Location: [facility];  Service: Orthopedics;  Laterality: Right;  . CESAREAN SECTION    . CHOLECYSTECTOMY    . CYST EXCISION    . TONSILLECTOMY    . TOOTH EXTRACTION      FAMILY HISTORY Family History  Problem Relation Age of  Onset  . Diabetes Mother   . Cancer Mother   . Irritable bowel syndrome Mother   . Hypertension Mother   . Sleep apnea Mother   . Diabetes Father   . Restless legs syndrome Father     SOCIAL HISTORY Social History   Tobacco Use  . Smoking status: Never Smoker  . Smokeless tobacco: Never Used  Substance Use Topics  . Alcohol use: No  . Drug use: No         OPHTHALMIC EXAM:  Base Eye Exam    Visual Acuity (Snellen - Linear)      Right Left   Dist cc 20/30 - HM   Dist ph cc 20/25 -2 NI   Correction: Glasses       Tonometry (Tonopen, 2:05  PM)      Right Left   Pressure 18 18       Pupils      Dark Light Shape React APD   Right 4 3 Round Minimal None   Left 4 3 Round Minimal None       Visual Fields (Counting fingers)      Left Right     Full   Restrictions Total inferior temporal, superior nasal, inferior nasal deficiencies; Partial inner superior temporal deficiency        Extraocular Movement      Right Left    Full, Ortho Full, Ortho       Neuro/Psych    Oriented x3: Yes   Mood/Affect: Normal       Dilation    Both eyes: 1.0% Mydriacyl, 2.5% Phenylephrine @ 2:05 PM        Slit Lamp and Fundus Exam    Slit Lamp Exam      Right Left   Lids/Lashes Dermatochalasis - upper lid, mild Meibomian gland dysfunction Dermatochalasis - upper lid, mild Meibomian gland dysfunction   Conjunctiva/Sclera White and quiet White and quiet   Cornea trace Punctate epithelial erosions Clear   Anterior Chamber deep, narrow temporal angle deep and clear   Iris Round and moderately dilated to 52m, No NVI Round and moderately dilated to 6.565m No NVI   Lens Clear Trace Cortical cataract   Vitreous mild Vitreous syneresis mild Vitreous syneresis, +RBC in anterior vitreous, diffuse subhyaloid hemorrhage temporally, old blood clot inferiorly turning white       Fundus Exam      Right Left   Disc Pink and Sharp, +fine NVD temporally and superiorly no view, obscured by  fibrosis   C/D Ratio 0.2    Macula Blunted foveal reflex, +central cystic changes, +ERM, scattered MA/IRH, +NVE along ST arcades, new pre-retinal / sub-hyaloid heme centrally obscured by subhyaloid heme and increased fibrosis, ?progression to TRD   Vessels Vascular attenuation, Tortuous, +NV, +venus beading IN arcades, fine NVE along ST arcades Increase in fibrosis and NVE along ST arcades   Periphery Attached, scattered DBH/CWS nasally  360 diffuse subhyaloid heme -- increased from prior; increased posterior fibrosis ?interval crunch          IMAGING AND PROCEDURES  Imaging and Procedures for '@TODAY' @  OCT, Retina - OU - Both Eyes       Right Eye Quality was good. Central Foveal Thickness: 341. Progression has been stable. Findings include no SRF, intraretinal fluid, vitreomacular adhesion , epiretinal membrane, abnormal foveal contour (Interval improvement in IRF/foveal profile; mild interval improvement in perifoveal sub-hyaloid heme).   Left Eye Quality was poor. Progression has no prior data. Findings include epiretinal membrane, preretinal fibrosis, vitreous traction, subretinal hyper-reflective material (No detail of fovea visible due to vitreous opacities, +fibrosis and traction around disc; ?tractional detachement).   Notes *Images captured and stored on drive  Diagnosis / Impression:  OD: abnormal foveal contour; Interval improvement in IRF/foveal profile; interval improvement in perifoveal sub-hyaloid heme OS: No details of fovea/macula visible due to vitreous opacities, +fibrosis and traction around disc;?tractional detachement    Clinical management:  See below  Abbreviations: NFP - Normal foveal profile. CME - cystoid macular edema. PED - pigment epithelial detachment. IRF - intraretinal fluid. SRF - subretinal fluid. EZ - ellipsoid zone. ERM - epiretinal membrane. ORA - outer retinal atrophy. ORT - outer retinal tubulation. SRHM - subretinal hyper-reflective  material  ASSESSMENT/PLAN:    ICD-10-CM   1. Proliferative diabetic retinopathy of both eyes with macular edema associated with type 2 diabetes mellitus ([place])  H41.7408   2. Retinal edema  H35.81 OCT, Retina - OU - Both Eyes  3. Vitreous hemorrhage of left eye (HCC)  H43.12   4. 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 Essential hypertension  I10   6. Hypertensive retinopathy of both eyes  H35.033     1-4. Proliferative diabetic retinopathy with macular edema OU (OS > OD)  - OS with acute decrease in vision from vitreous/subhyaloid heme  - initial exam shows large preretinal/subhyaloid heme OS and scattered fibrosis w/ NV; OD with scattered intraretinal DBH and flat NV  - The incidence, risk factors for progression, natural history and treatment options for diabetic retinopathy were discussed with patient.    - The need for close monitoring of blood glucose, blood pressure, and serum lipids, avoiding cigarette or any type of tobacco, and the need for long term follow up was also discussed with patient.  - FA (07.06.20) shows NV and capillary nonperfusion OU  - s/p IVA OS (07.06.20), #2 (08.03.20)  - s/p IVA OD #1 (08.03.20)  - s/p PRP OS (07.06.20)  - s/p PRP OD (08.03.20)  - exam today shows interval increase in subhyaloid heme and posterior fibrosis OS ?crunch; OD with persistent subhyaloid / preretinal heme centrally -- slightly improved  - OCT shows no details of fovea visible due to vitreous opacities , +fibrosis and traction around disc OS -- ?increased traction; OD with persistent central cystic changes / DME -- slightly improved  - discussed findings and prognosis   - recommend PPV w/ membrane peel and likely silicon oil OS under general anesthesia to clear VH and remove tractional fibrosis and repair TRD.  - RBA of procedure discussed, questions answered  - informed consent obtained and signed  - pt wishes to undergo surgery on 8.27.2020  - will  need medical clearance from PCP and pre-op COVID test  - f/u Friday, 8.28.20 for POV1   5,6. Hypertensive retinopathy OU  - discussed importance of tight BP control  - monitor     Ophthalmic Meds Ordered this visit:  No orders of the defined types were placed in this encounter.      Return in about 11 days (around 07/24/2019) for POV.  There are no Patient Instructions on file for this visit.   Explained the diagnoses, plan, and follow up with the patient and they expressed understanding.  Patient expressed understanding of the importance of proper follow up care.   This document serves as a record of services personally performed by Gardiner Sleeper, MD, PhD. It was created on their behalf by Ernest Mallick, OA, an ophthalmic assistant. The creation of this record is the provider's dictation and/or activities during the visit.    Electronically signed by: Ernest Mallick, OA  08.17.2020 12:16 AM    Gardiner Sleeper, M.D., Ph.D. Diseases & Surgery of the Retina and Vitreous [facility]  I have reviewed the above documentation for accuracy and completeness, and I agree with the above. Gardiner Sleeper, M.D., Ph.D. 07/14/19 12:16 AM    Abbreviations: M myopia (nearsighted); A astigmatism; H hyperopia (farsighted); P presbyopia; Mrx spectacle prescription;  CTL contact lenses; OD right eye; OS left eye; OU both eyes  XT exotropia; ET esotropia; PEK punctate epithelial keratitis; PEE punctate epithelial erosions; DES dry eye syndrome; MGD meibomian  gland dysfunction; ATs artificial tears; PFAT's preservative free artificial tears; [place] nuclear sclerotic cataract; PSC posterior subcapsular cataract; ERM epi-retinal membrane; PVD posterior vitreous detachment; RD retinal detachment; DM diabetes mellitus; DR diabetic retinopathy; NPDR non-proliferative diabetic retinopathy; PDR proliferative diabetic retinopathy; CSME clinically significant macular edema; DME diabetic  macular edema; dbh dot blot hemorrhages; CWS cotton wool spot; POAG primary open angle glaucoma; C/D cup-to-disc ratio; HVF humphrey visual field; GVF goldmann visual field; OCT optical coherence tomography; IOP intraocular pressure; BRVO Branch retinal vein occlusion; CRVO central retinal vein occlusion; CRAO central retinal artery occlusion; BRAO branch retinal artery occlusion; RT retinal tear; SB scleral buckle; PPV pars plana vitrectomy; VH Vitreous hemorrhage; PRP panretinal laser photocoagulation; IVK intravitreal kenalog; VMT vitreomacular traction; MH Macular hole;  NVD neovascularization of the disc; NVE neovascularization elsewhere; AREDS age related eye disease study; ARMD age related macular degeneration; POAG primary open angle glaucoma; EBMD epithelial/anterior basement membrane dystrophy; ACIOL anterior chamber intraocular lens; IOL intraocular lens; PCIOL posterior chamber intraocular lens; Phaco/IOL phacoemulsification with intraocular lens placement; [place] photorefractive keratectomy; LASIK laser assisted in situ keratomileusis; HTN hypertension; DM diabetes mellitus; COPD chronic obstructive pulmonary disease

## 2019-07-14 ENCOUNTER — Encounter (INDEPENDENT_AMBULATORY_CARE_PROVIDER_SITE_OTHER): Payer: Self-pay | Admitting: Ophthalmology

## 2019-07-20 ENCOUNTER — Other Ambulatory Visit (HOSPITAL_COMMUNITY)
Admission: RE | Admit: 2019-07-20 | Discharge: 2019-07-20 | Disposition: A | Discharge status: Home / Self Care | Payer: Medicaid Other | Source: Ambulatory Visit | Attending: Ophthalmology | Admitting: Ophthalmology

## 2019-07-20 DIAGNOSIS — Z20828 Contact with and (suspected) exposure to other viral communicable diseases: Secondary | ICD-10-CM | POA: Insufficient documentation

## 2019-07-20 DIAGNOSIS — Z01812 Encounter for preprocedural laboratory examination: Secondary | ICD-10-CM | POA: Diagnosis present

## 2019-07-20 LAB — SARS CORONAVIRUS 2 (TAT 6-24 HRS): SARS Coronavirus 2: NEGATIVE

## 2019-07-20 NOTE — H&P (Signed)
Lindsay Love is an 38 y.o. female.    Chief Complaint: Decreased vision OS  HPI: Pt w/ history of poorly-controlled, insulin-dependent DM, who presented with acute vision loss OS in July 2020. On exam was noted to have a visually significant vitreous hemorrhage due to proliferative diabetic retinopathy. Despite aggressive intervention, she developed worsening tractional fibrosis, which has led to a tractional retinal detachment in the left eye. After a long discussion of the findings, prognosis, treatment options and alternatives, the patient elected to proceed with surgery to clear the vitreous hemorrhage and repair the tractional retinal detachment in the left eye under general anesthesia.  Past Medical History:  Diagnosis Date  . Asthma   . Cellulitis of left foot   . Diabetes mellitus without complication ([place])   . Diabetic retinopathy ([place])    PDR OU  . GERD (gastroesophageal reflux disease) 10/27/2017  . Hypertensive retinopathy    OU  . IBS (irritable bowel syndrome)   . RLS (restless legs syndrome) 10/27/2017    Past Surgical History:  Procedure Laterality Date  . AMPUTATION Right 11/01/2017   Procedure: RIGHT FOOT 5TH RAY AMPUTATION;  Surgeon: Newt Minion, MD;  Location: [facility];  Service: Orthopedics;  Laterality: Right;  . CESAREAN SECTION    . CHOLECYSTECTOMY    . CYST EXCISION    . TONSILLECTOMY    . TOOTH EXTRACTION      Family History  Problem Relation Age of Onset  . Diabetes Mother   . Cancer Mother   . Irritable bowel syndrome Mother   . Hypertension Mother   . Sleep apnea Mother   . Diabetes Father   . Restless legs syndrome Father    Social History:  reports that she has never smoked. She has never used smokeless tobacco. She reports that she does not drink alcohol or use drugs.  Allergies:  Allergies  Allergen Reactions  . Morphine And Related Nausea And Vomiting    No medications prior to admission.    Review of systems otherwise  negative  There were no vitals taken for this visit.  Physical exam: Mental status: oriented x3. Eyes: See eye exam associated with this date of surgery Ears, Nose, Throat: within normal limits Neck: Within Normal limits General: within normal limits Chest: Within normal limits Breast: deferred Heart: Within normal limits Abdomen: Within normal limits GU: deferred Extremities: within normal limits Skin: within normal limits  Assessment/Plan 1. Proliferative diabetic retinopathy with vitreous hemorrhage and tractional retinal detachment, LEFT EYE  Plan: To [facility] for 25g PPV w/ membrane peel, endolaser and silicon oil, OS, under general anesthesia - case scheduled for 8.27.2020 -- [facility] OR 08  Gardiner Sleeper, M.D., Ph.D. Vitreoretinal Surgeon [facility]

## 2019-07-22 ENCOUNTER — Other Ambulatory Visit: Payer: Self-pay

## 2019-07-22 ENCOUNTER — Encounter (HOSPITAL_COMMUNITY): Payer: Self-pay | Admitting: *Deleted

## 2019-07-22 NOTE — Progress Notes (Signed)
Pt denies SOB, chest pain, and being under the care of a cardiologist. Pt stated that she is seen at [facility] and cannot recall PCP's name. Pt denies having a stress test, echo and cardiac cath. Pt denies recent labs.. Pt made aware to stop taking Aspirin (unless otherwise advised by surgeon), vitamins, fish oil and herbal medications. Do not take any NSAIDs ie: Ibuprofen, Advil, Naproxen (Aleve), Motrin, BC and Goody Powder. Pt made aware to do not take Metformin DOS. Pt made aware to take 50 units of Tresiba insulin only if CBG >70 DOS and take no Novolog insulin unless CBG is > 220 then, take 1/2 correction SS dose of Novolog insulin. Pt made aware to check BG every 2 hours prior to arrival to hospital on DOS. Pt made aware to treat a BG < 70 with  4 ounces of apple or cranberry juice, wait 15 minutes after intervention to recheck BG, if BG remains < 70, call Short Stay unit to speak with a nurse. Pt verbalized understanding of all pre-op instructions.

## 2019-07-23 ENCOUNTER — Ambulatory Visit (HOSPITAL_COMMUNITY)
Admission: RE | Admit: 2019-07-23 | Discharge: 2019-07-23 | Disposition: A | Discharge status: Home / Self Care | Payer: Medicaid Other | Attending: Ophthalmology | Admitting: Ophthalmology

## 2019-07-23 ENCOUNTER — Other Ambulatory Visit: Payer: Self-pay

## 2019-07-23 ENCOUNTER — Ambulatory Visit (HOSPITAL_COMMUNITY): Payer: Medicaid Other | Admitting: Anesthesiology

## 2019-07-23 ENCOUNTER — Encounter (HOSPITAL_COMMUNITY)
Admission: RE | Disposition: A | Discharge status: Home / Self Care | Payer: Self-pay | Source: Home / Self Care | Attending: Ophthalmology

## 2019-07-23 ENCOUNTER — Encounter (HOSPITAL_COMMUNITY): Payer: Self-pay | Admitting: *Deleted

## 2019-07-23 DIAGNOSIS — Z836 Family history of other diseases of the respiratory system: Secondary | ICD-10-CM | POA: Diagnosis not present

## 2019-07-23 DIAGNOSIS — H4312 Vitreous hemorrhage, left eye: Secondary | ICD-10-CM | POA: Insufficient documentation

## 2019-07-23 DIAGNOSIS — Z89421 Acquired absence of other right toe(s): Secondary | ICD-10-CM | POA: Insufficient documentation

## 2019-07-23 DIAGNOSIS — H3342 Traction detachment of retina, left eye: Secondary | ICD-10-CM | POA: Diagnosis present

## 2019-07-23 DIAGNOSIS — Z833 Family history of diabetes mellitus: Secondary | ICD-10-CM | POA: Insufficient documentation

## 2019-07-23 DIAGNOSIS — Z8249 Family history of ischemic heart disease and other diseases of the circulatory system: Secondary | ICD-10-CM | POA: Diagnosis not present

## 2019-07-23 DIAGNOSIS — Z8379 Family history of other diseases of the digestive system: Secondary | ICD-10-CM | POA: Diagnosis not present

## 2019-07-23 DIAGNOSIS — K219 Gastro-esophageal reflux disease without esophagitis: Secondary | ICD-10-CM | POA: Insufficient documentation

## 2019-07-23 DIAGNOSIS — Z9049 Acquired absence of other specified parts of digestive tract: Secondary | ICD-10-CM | POA: Diagnosis not present

## 2019-07-23 DIAGNOSIS — Z888 Allergy status to other drugs, medicaments and biological substances status: Secondary | ICD-10-CM | POA: Insufficient documentation

## 2019-07-23 DIAGNOSIS — E113532 Type 2 diabetes mellitus with proliferative diabetic retinopathy with traction retinal detachment not involving the macula, left eye: Secondary | ICD-10-CM | POA: Insufficient documentation

## 2019-07-23 DIAGNOSIS — Z809 Family history of malignant neoplasm, unspecified: Secondary | ICD-10-CM | POA: Insufficient documentation

## 2019-07-23 DIAGNOSIS — K589 Irritable bowel syndrome without diarrhea: Secondary | ICD-10-CM | POA: Insufficient documentation

## 2019-07-23 DIAGNOSIS — Z820 Family history of epilepsy and other diseases of the nervous system: Secondary | ICD-10-CM | POA: Diagnosis not present

## 2019-07-23 DIAGNOSIS — E113522 Type 2 diabetes mellitus with proliferative diabetic retinopathy with traction retinal detachment involving the macula, left eye: Secondary | ICD-10-CM

## 2019-07-23 DIAGNOSIS — Z6839 Body mass index (BMI) 39.0-39.9, adult: Secondary | ICD-10-CM | POA: Insufficient documentation

## 2019-07-23 DIAGNOSIS — J45909 Unspecified asthma, uncomplicated: Secondary | ICD-10-CM | POA: Diagnosis not present

## 2019-07-23 DIAGNOSIS — H35031 Hypertensive retinopathy, right eye: Secondary | ICD-10-CM | POA: Insufficient documentation

## 2019-07-23 DIAGNOSIS — G2581 Restless legs syndrome: Secondary | ICD-10-CM | POA: Insufficient documentation

## 2019-07-23 HISTORY — PX: INJECTION OF SILICONE OIL: SHX6422

## 2019-07-23 HISTORY — PX: REPAIR OF COMPLEX TRACTION RETINAL DETACHMENT: SHX6217

## 2019-07-23 HISTORY — DX: Presence of spectacles and contact lenses: Z97.3

## 2019-07-23 HISTORY — DX: Type 2 diabetes mellitus with proliferative diabetic retinopathy without macular edema, unspecified eye: E11.3599

## 2019-07-23 HISTORY — PX: EYE SURGERY: SHX253

## 2019-07-23 HISTORY — DX: Allergy status to unspecified drugs, medicaments and biological substances: Z88.9

## 2019-07-23 HISTORY — PX: PHOTOCOAGULATION WITH LASER: SHX6027

## 2019-07-23 HISTORY — DX: Essential (primary) hypertension: I10

## 2019-07-23 HISTORY — PX: PARS PLANA VITRECTOMY: SHX2166

## 2019-07-23 HISTORY — PX: MEMBRANE PEEL: SHX5967

## 2019-07-23 LAB — GLUCOSE, CAPILLARY
Glucose-Capillary: 119 mg/dL — ABNORMAL HIGH (ref 70–99)
Glucose-Capillary: 170 mg/dL — ABNORMAL HIGH (ref 70–99)

## 2019-07-23 LAB — BASIC METABOLIC PANEL
Anion gap: 9 (ref 5–15)
BUN: 27 mg/dL — ABNORMAL HIGH (ref 6–20)
CO2: 21 mmol/L — ABNORMAL LOW (ref 22–32)
Calcium: 9.3 mg/dL (ref 8.9–10.3)
Chloride: 105 mmol/L (ref 98–111)
Creatinine, Ser: 1.39 mg/dL — ABNORMAL HIGH (ref 0.44–1.00)
GFR calc Af Amer: 56 mL/min — ABNORMAL LOW (ref >60)
GFR calc non Af Amer: 48 mL/min — ABNORMAL LOW (ref >60)
Glucose, Bld: 126 mg/dL — ABNORMAL HIGH (ref 70–99)
Potassium: 4.7 mmol/L (ref 3.5–5.1)
Sodium: 135 mmol/L (ref 135–145)

## 2019-07-23 LAB — CBC
HCT: 36.7 % (ref 36.0–46.0)
Hemoglobin: 11.8 g/dL — ABNORMAL LOW (ref 12.0–15.0)
MCH: 26.1 pg (ref 26.0–34.0)
MCHC: 32.2 g/dL (ref 30.0–36.0)
MCV: 81.2 fL (ref 80.0–100.0)
Platelets: 462 10*3/uL — ABNORMAL HIGH (ref 150–400)
RBC: 4.52 MIL/uL (ref 3.87–5.11)
RDW: 13.4 % (ref 11.5–15.5)
WBC: 10.2 10*3/uL (ref 4.0–10.5)
nRBC: 0 % (ref 0.0–0.2)

## 2019-07-23 LAB — SURGICAL PCR SCREEN
MRSA, PCR: NEGATIVE
Staphylococcus aureus: NEGATIVE

## 2019-07-23 LAB — POCT PREGNANCY, URINE: Preg Test, Ur: NEGATIVE

## 2019-07-23 SURGERY — REPAIR, RETINAL DETACHMENT, COMPLEX
Anesthesia: General | Site: Eye | Laterality: Left

## 2019-07-23 MED ORDER — MUPIROCIN 2 % EX OINT
1.0000 "application " | TOPICAL_OINTMENT | Freq: Once | CUTANEOUS | Status: AC
  Administered 2019-07-23: 1 via TOPICAL
  Filled 2019-07-23: qty 22

## 2019-07-23 MED ORDER — DEXAMETHASONE SODIUM PHOSPHATE 10 MG/ML IJ SOLN
INTRAMUSCULAR | Status: AC
  Filled 2019-07-23: qty 1

## 2019-07-23 MED ORDER — MIDAZOLAM HCL 2 MG/2ML IJ SOLN
INTRAMUSCULAR | Status: AC
  Filled 2019-07-23: qty 2

## 2019-07-23 MED ORDER — GATIFLOXACIN 0.5 % OP SOLN
OPHTHALMIC | Status: AC
  Filled 2019-07-23: qty 2.5

## 2019-07-23 MED ORDER — FENTANYL CITRATE (PF) 100 MCG/2ML IJ SOLN: 25.0000 ug | INTRAMUSCULAR | Status: DC | PRN

## 2019-07-23 MED ORDER — STERILE WATER FOR INJECTION IJ SOLN
INTRAMUSCULAR | Status: DC | PRN
  Administered 2019-07-23: 15:00:00 20 mL

## 2019-07-23 MED ORDER — TRIAMCINOLONE ACETONIDE 40 MG/ML IJ SUSP
INTRAMUSCULAR | Status: AC
  Filled 2019-07-23: qty 5

## 2019-07-23 MED ORDER — BACITRACIN-POLYMYXIN B 500-10000 UNIT/GM OP OINT
TOPICAL_OINTMENT | OPHTHALMIC | Status: AC
  Filled 2019-07-23: qty 3.5

## 2019-07-23 MED ORDER — OXYCODONE HCL 5 MG PO TABS
ORAL_TABLET | ORAL | Status: AC
  Filled 2019-07-23: qty 1

## 2019-07-23 MED ORDER — PHENYLEPHRINE HCL 10 % OP SOLN
1.0000 [drp] | OPHTHALMIC | Status: AC | PRN
  Administered 2019-07-23 (×3): 1 [drp] via OPHTHALMIC
  Filled 2019-07-23: qty 5

## 2019-07-23 MED ORDER — DEXAMETHASONE SODIUM PHOSPHATE 10 MG/ML IJ SOLN
INTRAMUSCULAR | Status: DC | PRN
  Administered 2019-07-23: 4 mg via INTRAVENOUS

## 2019-07-23 MED ORDER — TOBRAMYCIN-DEXAMETHASONE 0.3-0.1 % OP OINT
TOPICAL_OINTMENT | OPHTHALMIC | Status: AC
  Filled 2019-07-23: qty 3.5

## 2019-07-23 MED ORDER — INDOCYANINE GREEN 25 MG IV SOLR
INTRAVENOUS | Status: AC
  Filled 2019-07-23: qty 25

## 2019-07-23 MED ORDER — BSS IO SOLN
INTRAOCULAR | Status: AC
  Filled 2019-07-23: qty 15

## 2019-07-23 MED ORDER — DEXTROSE 5 % IV SOLN
INTRAVENOUS | Status: AC
  Filled 2019-07-23: qty 100

## 2019-07-23 MED ORDER — BRIMONIDINE TARTRATE 0.2 % OP SOLN
OPHTHALMIC | Status: AC
  Filled 2019-07-23: qty 5

## 2019-07-23 MED ORDER — TRIAMCINOLONE ACETONIDE 40 MG/ML IJ SUSP
INTRAMUSCULAR | Status: DC | PRN
  Administered 2019-07-23: 40 mg via INTRAMUSCULAR

## 2019-07-23 MED ORDER — PREDNISOLONE ACETATE 1 % OP SUSP
OPHTHALMIC | Status: DC | PRN
  Administered 2019-07-23: 1 [drp] via OPHTHALMIC

## 2019-07-23 MED ORDER — LIDOCAINE 2% (20 MG/ML) 5 ML SYRINGE
INTRAMUSCULAR | Status: DC | PRN
  Administered 2019-07-23: 100 mg via INTRAVENOUS

## 2019-07-23 MED ORDER — ATROPINE SULFATE 1 % OP SOLN
1.0000 [drp] | OPHTHALMIC | Status: AC | PRN
  Administered 2019-07-23 (×3): 1 [drp] via OPHTHALMIC
  Filled 2019-07-23: qty 2

## 2019-07-23 MED ORDER — ATROPINE SULFATE 1 % OP SOLN
OPHTHALMIC | Status: AC
  Filled 2019-07-23: qty 5

## 2019-07-23 MED ORDER — PREDNISOLONE ACETATE 1 % OP SUSP
OPHTHALMIC | Status: AC
  Filled 2019-07-23: qty 5

## 2019-07-23 MED ORDER — SODIUM HYALURONATE 10 MG/ML IO SOLN
INTRAOCULAR | Status: AC
  Filled 2019-07-23: qty 0.85

## 2019-07-23 MED ORDER — CARBACHOL 0.01 % IO SOLN
INTRAOCULAR | Status: AC
  Filled 2019-07-23: qty 1.5

## 2019-07-23 MED ORDER — PROMETHAZINE HCL 25 MG/ML IJ SOLN
INTRAMUSCULAR | Status: AC
  Filled 2019-07-23: qty 1

## 2019-07-23 MED ORDER — ONDANSETRON HCL 4 MG/2ML IJ SOLN
INTRAMUSCULAR | Status: DC | PRN
  Administered 2019-07-23: 4 mg via INTRAVENOUS

## 2019-07-23 MED ORDER — FENTANYL CITRATE (PF) 100 MCG/2ML IJ SOLN
INTRAMUSCULAR | Status: DC | PRN
  Administered 2019-07-23 (×3): 50 ug via INTRAVENOUS
  Administered 2019-07-23: 100 ug via INTRAVENOUS

## 2019-07-23 MED ORDER — STERILE WATER FOR INJECTION IJ SOLN
INTRAMUSCULAR | Status: AC
  Filled 2019-07-23: qty 10

## 2019-07-23 MED ORDER — BSS IO SOLN
INTRAOCULAR | Status: DC | PRN
  Administered 2019-07-23: 15 mL via INTRAOCULAR

## 2019-07-23 MED ORDER — FENTANYL CITRATE (PF) 250 MCG/5ML IJ SOLN
INTRAMUSCULAR | Status: AC
  Filled 2019-07-23: qty 5

## 2019-07-23 MED ORDER — PROPOFOL 10 MG/ML IV BOLUS
INTRAVENOUS | Status: DC | PRN
  Administered 2019-07-23: 200 mg via INTRAVENOUS

## 2019-07-23 MED ORDER — SUGAMMADEX SODIUM 200 MG/2ML IV SOLN
INTRAVENOUS | Status: DC | PRN
  Administered 2019-07-23: 200 mg via INTRAVENOUS

## 2019-07-23 MED ORDER — BACITRACIN-POLYMYXIN B 500-10000 UNIT/GM OP OINT
TOPICAL_OINTMENT | OPHTHALMIC | Status: DC | PRN
  Administered 2019-07-23: 1 via OPHTHALMIC

## 2019-07-23 MED ORDER — SODIUM CHLORIDE 0.9 % IV SOLN
INTRAVENOUS | Status: DC | PRN
  Administered 2019-07-23: 50 ug/min via INTRAVENOUS

## 2019-07-23 MED ORDER — OXYCODONE HCL 5 MG/5ML PO SOLN: 5.0000 mg | Freq: Once | ORAL | Status: AC | PRN

## 2019-07-23 MED ORDER — BEVACIZUMAB CHEMO INJECTION 1.25MG/0.05ML SYRINGE FOR KALEIDOSCOPE
1.2500 mg | Freq: Once | INTRAVITREAL | Status: DC
  Filled 2019-07-23 (×3): qty 0.1

## 2019-07-23 MED ORDER — LIDOCAINE HCL (PF) 2 % IJ SOLN
INTRAMUSCULAR | Status: AC
  Filled 2019-07-23: qty 10

## 2019-07-23 MED ORDER — OXYCODONE HCL 5 MG PO TABS
5.0000 mg | ORAL_TABLET | Freq: Once | ORAL | Status: AC | PRN
  Administered 2019-07-23: 5 mg via ORAL

## 2019-07-23 MED ORDER — BRIMONIDINE TARTRATE 0.2 % OP SOLN
OPHTHALMIC | Status: DC | PRN
  Administered 2019-07-23: 1 [drp] via OPHTHALMIC

## 2019-07-23 MED ORDER — ONDANSETRON HCL 4 MG/2ML IJ SOLN
INTRAMUSCULAR | Status: AC
  Filled 2019-07-23: qty 2

## 2019-07-23 MED ORDER — POLYMYXIN B SULFATE 500000 UNITS IJ SOLR
INTRAMUSCULAR | Status: AC
  Filled 2019-07-23: qty 500000

## 2019-07-23 MED ORDER — BSS PLUS IO SOLN
INTRAOCULAR | Status: AC
  Filled 2019-07-23: qty 500

## 2019-07-23 MED ORDER — MEPERIDINE HCL 25 MG/ML IJ SOLN: 6.2500 mg | INTRAMUSCULAR | Status: DC | PRN

## 2019-07-23 MED ORDER — NA CHONDROIT SULF-NA HYALURON 40-30 MG/ML IO SOLN
INTRAOCULAR | Status: AC
  Filled 2019-07-23: qty 1

## 2019-07-23 MED ORDER — MIDAZOLAM HCL 5 MG/5ML IJ SOLN
INTRAMUSCULAR | Status: DC | PRN
  Administered 2019-07-23: 2 mg via INTRAVENOUS

## 2019-07-23 MED ORDER — DORZOLAMIDE HCL-TIMOLOL MAL 2-0.5 % OP SOLN
OPHTHALMIC | Status: AC
  Filled 2019-07-23: qty 10

## 2019-07-23 MED ORDER — EPINEPHRINE PF 1 MG/ML IJ SOLN
INTRAOCULAR | Status: DC | PRN
  Administered 2019-07-23: 15:00:00 500.3 mL

## 2019-07-23 MED ORDER — NA CHONDROIT SULF-NA HYALURON 40-30 MG/ML IO SOLN
INTRAOCULAR | Status: DC | PRN
  Administered 2019-07-23: 0.5 mL via INTRAOCULAR

## 2019-07-23 MED ORDER — SODIUM CHLORIDE (PF) 0.9 % IJ SOLN
INTRAMUSCULAR | Status: AC
  Filled 2019-07-23: qty 10

## 2019-07-23 MED ORDER — SODIUM CHLORIDE 0.9 % IV SOLN
INTRAVENOUS | Status: DC
  Administered 2019-07-23 (×2): via INTRAVENOUS

## 2019-07-23 MED ORDER — PROMETHAZINE HCL 25 MG/ML IJ SOLN
6.2500 mg | INTRAMUSCULAR | Status: DC | PRN
  Administered 2019-07-23: 6.25 mg via INTRAVENOUS

## 2019-07-23 MED ORDER — PHENYLEPHRINE 40 MCG/ML (10ML) SYRINGE FOR IV PUSH (FOR BLOOD PRESSURE SUPPORT)
PREFILLED_SYRINGE | INTRAVENOUS | Status: AC
  Filled 2019-07-23: qty 10

## 2019-07-23 MED ORDER — DORZOLAMIDE HCL-TIMOLOL MAL 2-0.5 % OP SOLN
OPHTHALMIC | Status: DC | PRN
  Administered 2019-07-23: 1 [drp] via OPHTHALMIC

## 2019-07-23 MED ORDER — EPINEPHRINE PF 1 MG/ML IJ SOLN
INTRAMUSCULAR | Status: AC
  Filled 2019-07-23: qty 1

## 2019-07-23 MED ORDER — BUPIVACAINE HCL (PF) 0.75 % IJ SOLN
INTRAMUSCULAR | Status: AC
  Filled 2019-07-23: qty 10

## 2019-07-23 MED ORDER — CEFTAZIDIME 1 G IJ SOLR
INTRAMUSCULAR | Status: AC
  Filled 2019-07-23: qty 1

## 2019-07-23 MED ORDER — GATIFLOXACIN 0.5 % OP SOLN
OPHTHALMIC | Status: DC | PRN
  Administered 2019-07-23: 1 [drp] via OPHTHALMIC

## 2019-07-23 MED ORDER — TROPICAMIDE 1 % OP SOLN
1.0000 [drp] | OPHTHALMIC | Status: AC | PRN
  Administered 2019-07-23 (×3): 1 [drp] via OPHTHALMIC
  Filled 2019-07-23: qty 15

## 2019-07-23 MED ORDER — ROCURONIUM BROMIDE 50 MG/5ML IV SOSY
PREFILLED_SYRINGE | INTRAVENOUS | Status: DC | PRN
  Administered 2019-07-23: 60 mg via INTRAVENOUS

## 2019-07-23 MED ORDER — PROPARACAINE HCL 0.5 % OP SOLN
1.0000 [drp] | OPHTHALMIC | Status: AC | PRN
  Administered 2019-07-23 (×3): 1 [drp] via OPHTHALMIC
  Filled 2019-07-23: qty 15

## 2019-07-23 SURGICAL SUPPLY — 72 items
APL SWBSTK 6 STRL LF DISP (MISCELLANEOUS) ×1
APPLICATOR COTTON TIP 6 STRL (MISCELLANEOUS) ×1 IMPLANT
APPLICATOR COTTON TIP 6IN STRL (MISCELLANEOUS) ×3
BLADE MVR KNIFE 20G (BLADE) IMPLANT
BLADE STAB KNIFE 15DEG (BLADE) IMPLANT
BNDG EYE OVAL (GAUZE/BANDAGES/DRESSINGS) ×2 IMPLANT
CANNULA ANT CHAM MAIN (OPHTHALMIC RELATED) IMPLANT
CANNULA DUAL BORE 23G (CANNULA) IMPLANT
CANNULA DUALBORE 25G (CANNULA) IMPLANT
CANNULA VLV SOFT TIP 25G (OPHTHALMIC) ×1 IMPLANT
CANNULA VLV SOFT TIP 25GA (OPHTHALMIC) ×3 IMPLANT
CAUTERY EYE LOW TEMP 1300F FIN (OPHTHALMIC RELATED) IMPLANT
CLOSURE STERI-STRIP 1/2X4 (GAUZE/BANDAGES/DRESSINGS) ×1
CLSR STERI-STRIP ANTIMIC 1/2X4 (GAUZE/BANDAGES/DRESSINGS) ×2 IMPLANT
COVER MAYO STAND STRL (DRAPES) IMPLANT
DRAPE HALF SHEET 40X57 (DRAPES) ×3 IMPLANT
DRAPE INCISE 51X51 W/FILM STRL (DRAPES) IMPLANT
DRAPE RETRACTOR (MISCELLANEOUS) ×3 IMPLANT
ERASER HMR WETFIELD 23G BP (MISCELLANEOUS) IMPLANT
FORCEPS ECKARDT ILM 25G SERR (OPHTHALMIC RELATED) IMPLANT
FORCEPS GRIESHABER ILM 25G A (INSTRUMENTS) IMPLANT
GAS AUTO FILL CONSTEL (OPHTHALMIC)
GAS AUTO FILL CONSTELLATION (OPHTHALMIC) IMPLANT
GLOVE ECLIPSE 7.5 STRL STRAW (GLOVE) ×3 IMPLANT
GOWN STRL REUS W/ TWL LRG LVL3 (GOWN DISPOSABLE) ×1 IMPLANT
GOWN STRL REUS W/TWL LRG LVL3 (GOWN DISPOSABLE) ×3
KIT BASIN OR (CUSTOM PROCEDURE TRAY) ×3 IMPLANT
KIT TURNOVER KIT B (KITS) ×3 IMPLANT
LENS BIOM SUPER VIEW SET DISP (MISCELLANEOUS) ×3 IMPLANT
MICROPICK 25G (MISCELLANEOUS)
NDL 18GX1X1/2 (RX/OR ONLY) (NEEDLE) ×1 IMPLANT
NDL 25GX 5/8IN NON SAFETY (NEEDLE) ×1 IMPLANT
NDL FILTER BLUNT 18X1 1/2 (NEEDLE) ×1 IMPLANT
NDL HYPO 25GX1X1/2 BEV (NEEDLE) IMPLANT
NDL HYPO 30X.5 LL (NEEDLE) ×2 IMPLANT
NDL RETROBULBAR 25GX1.5 (NEEDLE) ×1 IMPLANT
NEEDLE 18GX1X1/2 (RX/OR ONLY) (NEEDLE) ×3 IMPLANT
NEEDLE 25GX 5/8IN NON SAFETY (NEEDLE) ×3 IMPLANT
NEEDLE FILTER BLUNT 18X 1/2SAF (NEEDLE) ×2
NEEDLE FILTER BLUNT 18X1 1/2 (NEEDLE) ×1 IMPLANT
NEEDLE HYPO 25GX1X1/2 BEV (NEEDLE) IMPLANT
NEEDLE HYPO 30X.5 LL (NEEDLE) ×6 IMPLANT
NEEDLE RETROBULBAR 25GX1.5 (NEEDLE) ×3 IMPLANT
NS IRRIG 1000ML POUR BTL (IV SOLUTION) ×3 IMPLANT
OIL SILICONE OPHTHALMIC 1000 (Ophthalmic Related) ×2 IMPLANT
PACK FRAGMATOME (OPHTHALMIC) IMPLANT
PACK VITRECTOMY CUSTOM (CUSTOM PROCEDURE TRAY) ×3 IMPLANT
PAD ARMBOARD 7.5X6 YLW CONV (MISCELLANEOUS) ×6 IMPLANT
PAK PIK VITRECTOMY CVS 25GA (OPHTHALMIC) ×3 IMPLANT
PENCIL BIPOLAR 25GA STR DISP (OPHTHALMIC RELATED) IMPLANT
PIC ILLUMINATED 25G (OPHTHALMIC) ×3
PICK MICROPICK 25G (MISCELLANEOUS) IMPLANT
PIK ILLUMINATED 25G (OPHTHALMIC) IMPLANT
PROBE LASER ILLUM FLEX CVD 25G (OPHTHALMIC) IMPLANT
ROLLS DENTAL (MISCELLANEOUS) IMPLANT
SCISSORS TIP ADVANCED DSP 25GA (INSTRUMENTS) ×2 IMPLANT
SCRAPER DIAMOND 25GA (OPHTHALMIC RELATED) IMPLANT
SET INJECTOR OIL FLUID CONSTEL (OPHTHALMIC) ×2 IMPLANT
SHIELD EYE LENSE ONLY DISP (GAUZE/BANDAGES/DRESSINGS) ×2 IMPLANT
SOL ANTI FOG 6CC (MISCELLANEOUS) ×1 IMPLANT
SOLUTION ANTI FOG 6CC (MISCELLANEOUS) ×2
STOPCOCK 4 WAY LG BORE MALE ST (IV SETS) IMPLANT
SUT VICRYL 7 0 TG140 8 (SUTURE) ×3 IMPLANT
SUT VICRYL 8 0 TG140 8 (SUTURE) IMPLANT
SYR 10ML LL (SYRINGE) IMPLANT
SYR 20ML LL LF (SYRINGE) ×3 IMPLANT
SYR 5ML LL (SYRINGE) ×3 IMPLANT
SYR TB 1ML LUER SLIP (SYRINGE) IMPLANT
TAPE SURG TRANSPORE 1 IN (GAUZE/BANDAGES/DRESSINGS) IMPLANT
TAPE SURGICAL TRANSPORE 1 IN (GAUZE/BANDAGES/DRESSINGS) ×2
WATER STERILE IRR 1000ML POUR (IV SOLUTION) ×3 IMPLANT
WIPE INSTRUMENT VISIWIPE 73X73 (MISCELLANEOUS) IMPLANT

## 2019-07-23 NOTE — Transfer of Care (Signed)
Immediate Anesthesia Transfer of Care Note  Patient: Lindsay Love  Procedure(s) Performed: REPAIR OF COMPLEX TRACTION RETINAL DETACHMENT (Left Eye) [place] Vitrectomy With 25 Gauge (Left Eye) Membrane Peel (Left Eye) Photocoagulation With Laser (Left Eye) Injection Of Silicone Oil (Left Eye)  Patient Location: PACU  Anesthesia Type:General  Level of Consciousness: awake and drowsy  Airway & Oxygen Therapy: Patient Spontanous Breathing and Patient connected to nasal cannula oxygen  Post-op Assessment: Report given to RN and Post -op Vital signs reviewed and stable  Post vital signs: Reviewed and stable  Last Vitals:  Vitals Value Taken Time  BP 163/76 07/23/19 1732  Temp 36.1 C 07/23/19 1732  Pulse 111 07/23/19 1733  Resp 15 07/23/19 1733  SpO2 96 % 07/23/19 1733  Vitals shown include unvalidated device data.  Last Pain:  Vitals:   07/23/19 1310  TempSrc:   PainSc: 0-No pain      Patients Stated Pain Goal: 3 (33/82/50 5397)  Complications: No apparent anesthesia complications

## 2019-07-23 NOTE — Brief Op Note (Signed)
07/23/2019  5:39 PM  PATIENT:  Lindsay Love  38 y.o. female  PRE-OPERATIVE DIAGNOSIS:  tractional retinal detachment with vitreous hemorrhage, left eye  POST-OPERATIVE DIAGNOSIS:  tractional retinal detachment with vitreous hemorrhage, left eye  PROCEDURE:  Procedure(s): REPAIR OF COMPLEX TRACTION RETINAL DETACHMENT (Left) [place] Vitrectomy With 25 Gauge (Left) Membrane Peel (Left) Photocoagulation With Laser (Left) Injection Of Silicone Oil (Left)  SURGEON:  Surgeon(s) and Role:    Bernarda Caffey, MD - Primary  ASSISTANTS: Ernest Mallick, Ophthalmic Assistant  ANESTHESIA:   general  EBL:  1 mL   BLOOD ADMINISTERED:none  DRAINS: none   LOCAL MEDICATIONS USED:  NONE  SPECIMEN:  No Specimen  DISPOSITION OF SPECIMEN:  N/A  COUNTS:  YES  TOURNIQUET:  * No tourniquets in log *  DICTATION: .Note written in EPIC  PLAN OF CARE: Discharge to home after PACU  PATIENT DISPOSITION:  PACU - hemodynamically stable.   Delay start of Pharmacological VTE agent (>24hrs) due to surgical blood loss or risk of bleeding: not applicable

## 2019-07-23 NOTE — Anesthesia Preprocedure Evaluation (Signed)
Anesthesia Evaluation  Patient identified by MRN, date of birth, ID band Patient awake    Reviewed: Allergy & Precautions, NPO status , Patient's Chart, lab work & pertinent test results  Airway Mallampati: III  TM Distance: <3 FB Neck ROM: Full    Dental  (+) Poor Dentition, Teeth Intact   Pulmonary asthma ,     + decreased breath sounds      Cardiovascular hypertension, negative cardio ROS   Rhythm:Regular Rate:Normal     Neuro/Psych    GI/Hepatic GERD  ,  Endo/Other  diabetes, Poorly Controlled, Type 1, Insulin DependentMorbid obesity  Renal/GU Renal disease     Musculoskeletal   Abdominal   Peds  Hematology Infection and sepsis   Anesthesia Other Findings   Reproductive/Obstetrics                             Anesthesia Physical  Anesthesia Plan  ASA: III  Anesthesia Plan: General   Post-op Pain Management:    Induction: Intravenous  PONV Risk Score and Plan: 3 and Treatment may vary due to age or medical condition, Ondansetron, Dexamethasone and Midazolam  Airway Management Planned: Oral ETT  Additional Equipment: None  Intra-op Plan:   Post-operative Plan: Extubation in OR  Informed Consent: I have reviewed the patients History and Physical, chart, labs and discussed the procedure including the risks, benefits and alternatives for the proposed anesthesia with the patient or authorized representative who has indicated his/her understanding and acceptance.     Dental advisory given  Plan Discussed with: CRNA  Anesthesia Plan Comments:         Anesthesia Quick Evaluation

## 2019-07-23 NOTE — Op Note (Signed)
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 25 gauge pars plana vitrectomy, Left Eye 2. Diabetic vitreous membrane peel, Left Eye  3. Perfluoron injection, Left Eye 4. Endolaser, Left Eye 5. Fluid air exchange, Left Eye 6. Silicon oil injection, 1000cs, Left Eye    Indications for procedure: The patient presented with history of decreased vision due to diabetic vitreous hemorrhage and underlying tractional retinal detachment, left eye. After discussing the risks, benefits, and alternatives to surgery, the patient elected to undergo surgical repair and informed consent was obtained.  The surgery was an attempt to clear the vitreous hemorrhage and repair the tractional retinal detachment, within the reasonable expectations of the surgeon.    Procedure in Detail:    The patient was met in the pre-operative holding area where their identification data was verified.  It was noted that there was a signed, informed consent in the chart and the Left Eye eye was verbally verified by the patient as the operative eye and was marked with a marking pen. The patient was then taken to the operating room and placed in the supine position. General endotracheal anesthesia was induced.  The eye was then prepped with 5% betadine and draped in the normal fashion for ophthalmic surgery. The microscope was draped and swung into position, and a secondary time-out was performed to identify the correct patient, eyes, procedures, and any allergies.   A 25 gauge trocar was inserted in a 30-45 degrees fashion into the inferotemporal  quadrant 4 mm posterior to the limbus in this phakic patient.  Correct positioning within the vitreous was verified externally with the light pipe.  The infusion was then connected to the cannula and BSS infusion was commenced.  Additional ports were placed in the superonasal and superotemporal quadrants. Viscoat was placed on the cornea and the BIOM was used to visualize the posterior segment while the core vitrectomy was completed. The patient had dense, diffuse vitreous hemorrhage throughout the vitreous cavity. Once the bulk of the vitreous hemorrhage was cleared, there was dense fibrosis over the nerve and extending along the posterior arcades. These tractional membranes created a superior tractional retinal detachment and subretinal heme was visible above the optic disc. A posterior vitreous detachment was confirmed using suction over the optic nerve head and lifting anteriorly. The remaining vitreous was was meticulously removed. Kenalog was used to aid in these processes.    A macular contact lens was placed on the eye. End-grasping ILM forceps, MaxGrip forceps, intraocular scissors and the vitrectomy probe were used to carefully peel and dissect preretinal membranes and fibrosis off the entire surface of the retina as was deemed safe. At this point, the widefield BIOM was swung back into place and perfluoron was injected to flatten the posterior pole and push the subretinal fluid anteriorly into the superior quadrant. Diathermy and a 25g soft-tipped extrusion cannula were used to create a superonasal retinotomy and allow drainage of this subretinal fluid and heme. A fluid air exchange was performed over the retinotomy removing a majority of the subretinal fluid and heme and flattening the retina. Under air, the endolaser was used to apply laser around the retinotomy and 360 PRP laser  posteriorly almost to the arcades 360. Following these maneuvers, the tractional retinal detachment was vastly improved  with dense PRP laser in place. Then, 9865 cs silicon oil was injected via the superior nasal trocar and filled up to the level of the lens plane with the aide of the venting cannula.  The three trocars were then removed and sutured with 7-0 vicryl, there was no leakage and the eye was left slightly soft. Subconjunctival injections of kefzol + bacitracin + polymixin b and kenalog were then administered. Betadine was then applied to the eye and an intravitreal injection of Avastin (bevacizumab, 1.25 mg) was administered in the superonasal quadrant, 71m posterior to the limbus without complication. Betadine was again applied to eye and then thoroughly rinsed from the eye using sterile BSS. The IOP was checked by digital palpation and found to be within physiologic range.   Antibiotic and steroid drops as well as antibiotic ointment were placed in the eye.  The drapes were removed and the eye was patched and shielded. The patient was then taken to the post-operative area for recovery having tolerated the procedure well.  She was instructed to perform face down positioning postoperatively and to follow up in clinic the following morning as scheduled.   Estimate blood lost: minimal Complications: None

## 2019-07-23 NOTE — Progress Notes (Signed)
[facility] Note  07/24/2019     CHIEF COMPLAINT Patient presents for Post-op Follow-up   HISTORY OF PRESENT ILLNESS: Lindsay Love is a 38 y.o. female who presents to the clinic today for:   HPI    Post-op Follow-up    In left eye.  Discomfort includes pain.  Vision is blurred at distance and is blurred at near.  I, the attending physician,  performed the HPI with the patient and updated documentation appropriately.          Comments    Patient states she had eye pain and foreign body sensation OS through the night.  Patient had more pain today after removal of patch OS.        Last edited by Bernarda Caffey, MD on 07/24/2019  9:19 AM. (History)    pt states she is in a little bit of pain, she states she is maintaining face down positioning  Referring physician: Debbora Lacrosse, [place],  [place] [address]  HISTORICAL INFORMATION:   Selected notes from the [facility] Referred by Dr. Maryjane Hurter for concern of diabetic retinopathy LEE: 07.02.20 (M. Le) [BCVA: OD: 20/40 OS: LP] Ocular Hx-retinal traction, CME OS, optic nerve edema OS PMH-DM (A1c: 13.0 (12.02.18), takes tresiba/novolog), ashthma   CURRENT MEDICATIONS: No current outpatient medications on file. (Ophthalmic Drugs)   No current facility-administered medications for this visit.  (Ophthalmic Drugs)   Current Outpatient Medications (Other)  Medication Sig  . ACCU-CHEK FASTCLIX LANCETS MISC USE TO TEST BLOOD SUGAR 5 TIMES DAILY  . albuterol (PROVENTIL HFA;VENTOLIN HFA) 108 (90 Base) MCG/ACT inhaler Inhale 2 puffs into the lungs every 6 (six) hours as needed for wheezing or shortness of breath.  . blood glucose meter kit and supplies Dispense based on patient and insurance preference. Use up to four times daily as directed. (FOR ICD-9 250.00, 250.01).  . Blood Glucose Monitoring Suppl (GLUCOCOM BLOOD GLUCOSE MONITOR) DEVI Check blood sugar as  directed  . fluticasone (FLONASE) 50 MCG/ACT nasal spray Place 1 spray into both nostrils daily as needed for allergies.   Marland Kitchen gabapentin (NEURONTIN) 600 MG tablet Take 1,200 mg by mouth 2 (two) times daily.  Marland Kitchen glucose blood (ACCU-CHEK GUIDE) test strip Use as instructed  . insulin aspart (NOVOLOG) 100 UNIT/ML injection Inject 35 Units into the skin 3 (three) times daily with meals. (Patient taking differently: Inject 25-50 Units into the skin See admin instructions. 3-4 times daily.  "Sliding scale plus 20 units.")  . Insulin Degludec (TRESIBA FLEXTOUCH) 200 UNIT/ML SOPN Inject 100 Units into the skin every morning.  . Insulin Pen Needle (FIFTY50 PEN NEEDLES) 31G X 5 MM MISC USE ONE NEEDLE WITH EACH INSULIN INJECTION 6 TIMES PER DAY. E10.65  . linaclotide (LINZESS) 145 MCG CAPS capsule Take 145 mcg by mouth daily as needed (irritable bowel syndrome).  Marland Kitchen lisinopril (ZESTRIL) 30 MG tablet Take 30 mg by mouth daily.  Marland Kitchen loratadine (CLARITIN) 10 MG tablet Take 1 tablet (10 mg total) by mouth daily.  . metFORMIN (GLUCOPHAGE) 1000 MG tablet Take 1,000 mg by mouth 2 (two) times daily with a meal.  . pantoprazole (PROTONIX) 40 MG tablet Take 1 tablet (40 mg total) by mouth daily at 6 (six) AM.  . tiZANidine (ZANAFLEX) 4 MG tablet Take 4 mg by mouth every 8 (eight) hours as needed for muscle spasms.    No current facility-administered medications for this visit.  (Other)  REVIEW OF SYSTEMS: ROS    Positive for: Endocrine, Eyes   Negative for: Constitutional, Gastrointestinal, Neurological, Skin, Genitourinary, Musculoskeletal, HENT, Cardiovascular, Respiratory, Psychiatric, Allergic/Imm, Heme/Lymph   Last edited by Doneen Poisson on 07/24/2019  9:11 AM. (History)       ALLERGIES Allergies  Allergen Reactions  . Morphine And Related Nausea And Vomiting    PAST MEDICAL HISTORY Past Medical History:  Diagnosis Date  . Asthma   . Cellulitis of left foot   . Diabetes mellitus without  complication ([place])   . Diabetic retinopathy ([place])    PDR OU  . GERD (gastroesophageal reflux disease) 10/27/2017  . H/O seasonal allergies   . Hypertension   . Hypertensive retinopathy    OU  . IBS (irritable bowel syndrome)   . Proliferative diabetic retinopathy ([place])    with vitreous hemorrhage and tractional retinal detachment, left eye  . RLS (restless legs syndrome) 10/27/2017  . Wears glasses    Past Surgical History:  Procedure Laterality Date  . 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 CESAREAN SECTION    . CHOLECYSTECTOMY    . CYST EXCISION    . INJECTION OF SILICONE OIL Left 3/97/6734   Procedure: Injection Of Silicone Oil;  Surgeon: Bernarda Caffey, MD;  Location: [facility];  Service: Ophthalmology;  Laterality: Left;  . MEMBRANE PEEL Left 07/23/2019   Procedure: Antoine Primas;  Surgeon: Bernarda Caffey, MD;  Location: [facility];  Service: Ophthalmology;  Laterality: Left;  . PARS PLANA VITRECTOMY Left 07/23/2019   Procedure: Pars Plana Vitrectomy With 25 Gauge;  Surgeon: Bernarda Caffey, MD;  Location: [facility];  Service: Ophthalmology;  Laterality: Left;  . PHOTOCOAGULATION WITH LASER Left 07/23/2019   Procedure: Photocoagulation With Laser;  Surgeon: Bernarda Caffey, MD;  Location: [facility];  Service: Ophthalmology;  Laterality: Left;  . REPAIR OF COMPLEX TRACTION RETINAL DETACHMENT Left 07/23/2019   Procedure: REPAIR OF COMPLEX TRACTION RETINAL DETACHMENT;  Surgeon: Bernarda Caffey, MD;  Location: [facility];  Service: Ophthalmology;  Laterality: Left;  . TONSILLECTOMY    . TOOTH EXTRACTION      FAMILY HISTORY Family History  Problem Relation Age of Onset  . Diabetes Mother   . Cancer Mother   . Irritable bowel syndrome Mother   . Hypertension Mother   . Sleep apnea Mother   . Diabetes Father   . Restless legs syndrome Father     SOCIAL HISTORY Social History    Tobacco Use  . Smoking status: Never Smoker  . Smokeless tobacco: Never Used  Substance Use Topics  . Alcohol use: No  . Drug use: No         OPHTHALMIC EXAM:  Base Eye Exam    Visual Acuity (Snellen - Linear)      Right Left   Dist [place] 20/200 +1 CF @ face   Dist ph [place] 20/50 -1 NI   Correction: Glasses       Tonometry (Tonopen, 9:16 AM)      Right Left   Pressure 17 24       Pupils      Dark Light Shape React APD   Right 4 3 Round Brisk 0   Left 6 6 Round dilated        Neuro/Psych    Oriented x3: Yes   Mood/Affect: Normal       Dilation  Left eye: 1.0% Mydriacyl, 2.5% Phenylephrine @ 9:16 AM        Slit Lamp and Fundus Exam    Slit Lamp Exam      Right Left   Lids/Lashes Dermatochalasis - upper lid, mild Meibomian gland dysfunction Dermatochalasis - upper lid, mild Meibomian gland dysfunction, periorbital edema   Conjunctiva/Sclera White and quiet Subconjunctival hemorrhage temporally, sutures intact   Cornea trace Punctate epithelial erosions 1+ Descemet's folds   Anterior Chamber deep, narrow temporal angle Shallow, clear   Iris Round and moderately dilated to 56m, No NVI Round and moderately dilated to 6.517m No NVI   Lens Clear Trace Cortical cataract, 1+PC feathering and PSC   Vitreous mild Vitreous syneresis post vitrectomy; good silicon oil fill       Fundus Exam      Right Left   Disc  improved fibrosis   C/D Ratio 0.2    Macula  Flat under oil, mild residual fibrosis   Vessels  Attenuated; tortuous; improved fibrosis   Periphery  Attached under oil; good 360 PRP; improved fibrosis        Refraction    Wearing Rx      Sphere Cylinder Axis   Right -4.50 +1.75 018   Left -5.00 +1.00 124   Type: SVL          IMAGING AND PROCEDURES  Imaging and Procedures for _0 @           ASSESSMENT/PLAN:    ICD-10-CM   1. Proliferative diabetic retinopathy of both eyes with macular edema associated with type 2 diabetes mellitus  ([facility] [id number] 2. Retinal edema  H35.81 CANCELED: OCT, Retina - OU - Both Eyes  3. Vitreous hemorrhage of left eye (HCC)  H43.12   4. 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 Essential hypertension  I10   6. Hypertensive retinopathy of both eyes  H35.033     1-4. Proliferative diabetic retinopathy with macular edema OU (OS > OD)  - OS with acute decrease in vision from vitreous/subhyaloid heme  - initial exam showed large preretinal/subhyaloid heme OS and scattered fibrosis w/ NV; OD with scattered intraretinal DBH and flat NV  - FA (07.06.20) shows NV and capillary nonperfusion OU  - s/p IVA OS (07.06.20), #2 (08.03.20)  - s/p IVA OD #1 (08.03.20)  - s/p PRP OS (07.06.20)  - s/p PRP OD (08.03.20)  - OS with progressive increase in VH and tractional fibrosis  - now POD1 s/p PPV/MP/EL/silicone oil OS [id number]           - doing well this morning             - good silicone oil bubble in place             - IOP mildly elevated at 24             - start   PF 4x/day OS                          zymaxid QID OS                          Atropine BID OS    Brimonidine BID OS                         Cosopt BID OS  PSO ung QID OS              - cont face down positioning x3 days then can decrease positioning to 50% of time; avoid laying flat on back              - eye shield when sleeping              - post op drop and positioning instructions reviewed              - tylenol/ibuprofen for pain  - f/u 1 week   5,6. Hypertensive retinopathy OU  - discussed importance of tight BP control  - monitor     Ophthalmic Meds Ordered this visit:  No orders of the defined types were placed in this encounter.      Return in about 6 days (around 07/30/2019) for POV.  There are no Patient Instructions on file for this visit.   Explained the diagnoses, plan, and follow up with the patient and they expressed understanding.  Patient expressed understanding of  the importance of proper follow up care.   This document serves as a record of services personally performed by Gardiner Sleeper, MD, PhD. It was created on their behalf by Ernest Mallick, OA, an ophthalmic assistant. The creation of this record is the provider's dictation and/or activities during the visit.    Electronically signed by: Ernest Mallick, OA  08.27.2020 1:07 PM      Gardiner Sleeper, M.D., Ph.D. Diseases & Surgery of the Retina and Vitreous [facility]  I have reviewed the above documentation for accuracy and completeness, and I agree with the above. Gardiner Sleeper, M.D., Ph.D. 07/26/19 1:11 PM   Abbreviations: M myopia (nearsighted); A astigmatism; H hyperopia (farsighted); P presbyopia; Mrx spectacle prescription;  CTL contact lenses; OD right eye; OS left eye; OU both eyes  XT exotropia; ET esotropia; PEK punctate epithelial keratitis; PEE punctate epithelial erosions; DES dry eye syndrome; MGD meibomian gland dysfunction; ATs artificial tears; PFAT's preservative free artificial tears; [place] nuclear sclerotic cataract; PSC posterior subcapsular cataract; ERM epi-retinal membrane; PVD posterior vitreous detachment; RD retinal detachment; DM diabetes mellitus; DR diabetic retinopathy; NPDR non-proliferative diabetic retinopathy; PDR proliferative diabetic retinopathy; CSME clinically significant macular edema; DME diabetic macular edema; dbh dot blot hemorrhages; CWS cotton wool spot; POAG primary open angle glaucoma; C/D cup-to-disc ratio; HVF humphrey visual field; GVF goldmann visual field; OCT optical coherence tomography; IOP intraocular pressure; BRVO Branch retinal vein occlusion; CRVO central retinal vein occlusion; CRAO central retinal artery occlusion; BRAO branch retinal artery occlusion; RT retinal tear; SB scleral buckle; PPV pars plana vitrectomy; VH Vitreous hemorrhage; PRP panretinal laser photocoagulation; IVK intravitreal kenalog; VMT vitreomacular  traction; MH Macular hole;  NVD neovascularization of the disc; NVE neovascularization elsewhere; AREDS age related eye disease study; ARMD age related macular degeneration; POAG primary open angle glaucoma; EBMD epithelial/anterior basement membrane dystrophy; ACIOL anterior chamber intraocular lens; IOL intraocular lens; PCIOL posterior chamber intraocular lens; Phaco/IOL phacoemulsification with intraocular lens placement; [place] photorefractive keratectomy; LASIK laser assisted in situ keratomileusis; HTN hypertension; DM diabetes mellitus; COPD chronic obstructive pulmonary disease

## 2019-07-23 NOTE — Anesthesia Procedure Notes (Signed)
Procedure Name: Intubation Date/Time: 07/23/2019 2:23 PM Performed by: Inda Coke, CRNA Pre-anesthesia Checklist: Patient identified, Emergency Drugs available, Suction available and Patient being monitored Patient Re-evaluated:Patient Re-evaluated prior to induction Oxygen Delivery Method: Circle System Utilized Preoxygenation: Pre-oxygenation with 100% oxygen Induction Type: IV induction Ventilation: Mask ventilation without difficulty and Oral airway inserted - appropriate to patient size Laryngoscope Size: Mac and 3 Grade View: Grade I Tube type: Oral Tube size: 7.0 mm Number of attempts: 1 Airway Equipment and Method: Stylet and Oral airway Placement Confirmation: ETT inserted through vocal cords under direct vision,  positive ETCO2 and breath sounds checked- equal and bilateral Secured at: 21 cm Tube secured with: Tape Dental Injury: Teeth and Oropharynx as per pre-operative assessment

## 2019-07-23 NOTE — Interval H&P Note (Signed)
History and Physical Interval Note:  07/23/2019 1:51 PM  Lindsay Love  has presented today for surgery, with the diagnosis of tractional retinal detachment with vitreous hemorrhage, left eye.  The various methods of treatment have been discussed with the patient and family. After consideration of risks, benefits and other options for treatment, the patient has consented to  Procedure(s): Arnold (Left) as a surgical intervention.  The patient's history has been reviewed, patient examined, no change in status, stable for surgery.  I have reviewed the patient's chart and labs.  Questions were answered to the patient's satisfaction.     Bernarda Caffey

## 2019-07-23 NOTE — Discharge Instructions (Addendum)
POSTOPERATIVE INSTRUCTIONS ° °Your doctor has performed vitreoretinal surgery on you at [facility]. ° °- Keep eye patched and shielded until seen by Dr. Zamora 9 AM tomorrow in clinic °- Do not use drops until return °- FACE DOWN POSITIONING WHILE AWAKE °- Sleep with belly down or on right side, avoid laying flat on back.   ° °- No strenuous bending, stooping or lifting. ° °- You may not drive until further notice. ° °- If your doctor used a gas bubble in your eye during the procedure he will advise you on postoperative positioning. If you have a gas bubble you will be wearing a green bracelet that was applied in the operating room. The green bracelet should stay on as long as the gas bubble is in your eye. While the gas bubble is present you should not fly in an airplane. If you require general anesthesia while the gas bubble is present you must notify your anesthesiologist that an intraocular gas bubble is present so he can take the appropriate precautions. ° °- Tylenol or any other over-the-counter pain reliever can be used according to your doctor. If more pain medicine is required, your doctor will have a prescription for you. ° °- You may read, go up and down stairs, and watch television. ° ° ° Brian Zamora, M.D., Ph.D. ° °

## 2019-07-24 ENCOUNTER — Ambulatory Visit (INDEPENDENT_AMBULATORY_CARE_PROVIDER_SITE_OTHER): Payer: Medicaid Other | Admitting: Ophthalmology

## 2019-07-24 ENCOUNTER — Encounter (INDEPENDENT_AMBULATORY_CARE_PROVIDER_SITE_OTHER): Payer: Self-pay | Admitting: Ophthalmology

## 2019-07-24 ENCOUNTER — Other Ambulatory Visit: Payer: Self-pay

## 2019-07-24 DIAGNOSIS — I10 Essential (primary) hypertension: Secondary | ICD-10-CM

## 2019-07-24 DIAGNOSIS — E113513 Type 2 diabetes mellitus with proliferative diabetic retinopathy with macular edema, bilateral: Secondary | ICD-10-CM

## 2019-07-24 DIAGNOSIS — H4312 Vitreous hemorrhage, left eye: Secondary | ICD-10-CM

## 2019-07-24 DIAGNOSIS — H3342 Traction detachment of retina, left eye: Secondary | ICD-10-CM

## 2019-07-24 DIAGNOSIS — H3581 Retinal edema: Secondary | ICD-10-CM

## 2019-07-24 DIAGNOSIS — H35033 Hypertensive retinopathy, bilateral: Secondary | ICD-10-CM

## 2019-07-24 NOTE — Anesthesia Postprocedure Evaluation (Signed)
Anesthesia Post Note  Patient: Lindsay Love  Procedure(s) Performed: REPAIR OF COMPLEX TRACTION RETINAL DETACHMENT (Left Eye) Pars Plana Vitrectomy With 25 Gauge (Left Eye) Membrane Peel (Left Eye) Photocoagulation With Laser (Left Eye) Injection Of Silicone Oil (Left Eye)     Patient location during evaluation: PACU Anesthesia Type: General Level of consciousness: awake and alert Pain management: pain level controlled Vital Signs Assessment: post-procedure vital signs reviewed and stable Respiratory status: spontaneous breathing, nonlabored ventilation, respiratory function stable and patient connected to nasal cannula oxygen Cardiovascular status: blood pressure returned to baseline and stable Postop Assessment: no apparent nausea or vomiting Anesthetic complications: no    Last Vitals:  Vitals:   07/23/19 1800 07/23/19 1815  BP: (!) 157/90 (!) 159/100  Pulse: (!) 109 (!) 113  Resp: 18 16  Temp:    SpO2: 96% 97%    Last Pain:  Vitals:   07/23/19 1815  TempSrc:   PainSc: 3                  Tyrique Sporn

## 2019-07-30 ENCOUNTER — Encounter (INDEPENDENT_AMBULATORY_CARE_PROVIDER_SITE_OTHER): Payer: Medicaid Other | Admitting: Ophthalmology

## 2019-07-31 ENCOUNTER — Ambulatory Visit (INDEPENDENT_AMBULATORY_CARE_PROVIDER_SITE_OTHER): Payer: Medicaid Other | Admitting: Ophthalmology

## 2019-07-31 ENCOUNTER — Other Ambulatory Visit: Payer: Self-pay

## 2019-07-31 DIAGNOSIS — H35033 Hypertensive retinopathy, bilateral: Secondary | ICD-10-CM

## 2019-07-31 DIAGNOSIS — E113513 Type 2 diabetes mellitus with proliferative diabetic retinopathy with macular edema, bilateral: Secondary | ICD-10-CM

## 2019-07-31 DIAGNOSIS — I10 Essential (primary) hypertension: Secondary | ICD-10-CM

## 2019-07-31 DIAGNOSIS — H3581 Retinal edema: Secondary | ICD-10-CM

## 2019-07-31 DIAGNOSIS — H3342 Traction detachment of retina, left eye: Secondary | ICD-10-CM

## 2019-07-31 DIAGNOSIS — H4312 Vitreous hemorrhage, left eye: Secondary | ICD-10-CM

## 2019-07-31 MED ORDER — PREDNISOLONE ACETATE 1 % OP SUSP: 1.0000 [drp] | Freq: Four times a day (QID) | OPHTHALMIC | 0 refills | Status: AC

## 2019-07-31 NOTE — Progress Notes (Signed)
[facility] Note  07/31/2019     CHIEF COMPLAINT Patient presents for Post-op Follow-up   HISTORY OF PRESENT ILLNESS: Lindsay Love is a 38 y.o. female who presents to the clinic today for:   HPI    Post-op Follow-up    In left eye.  Discomfort includes pain.  Vision is improved.  I, the attending physician,  performed the HPI with the patient and updated documentation appropriately.          Comments    Patient states her vision is improving.  Patient is still having some mild pain.  Patient denies new floaters.  Recently had new flash of light OS.       Last edited by Bernarda Caffey, MD on 08/04/2019  8:02 AM. (History)    pt states she is doing face down positioning almost all the time, she is not having any problems with her recovery  Referring physician: Robert Bellow, PA-C [facility] [address],  [place] [address]  HISTORICAL INFORMATION:   Selected notes from the MEDICAL RECORD NUMBER Referred by Dr. Maryjane Hurter for concern of diabetic retinopathy LEE: 07.02.20 (M. Le) [BCVA: OD: 20/40 OS: LP] Ocular Hx-retinal traction, CME OS, optic nerve edema OS PMH-DM (A1c: 13.0 (12.02.18), takes tresiba/novolog), ashthma   CURRENT MEDICATIONS: Current Outpatient Medications (Ophthalmic Drugs)  Medication Sig  . prednisoLONE acetate (PRED FORTE) 1 % ophthalmic suspension Place 1 drop into the left eye 4 (four) times daily.   No current facility-administered medications for this visit.  (Ophthalmic Drugs)   Current Outpatient Medications (Other)  Medication Sig  . ACCU-CHEK FASTCLIX LANCETS MISC USE TO TEST BLOOD SUGAR 5 TIMES DAILY  . albuterol (PROVENTIL HFA;VENTOLIN HFA) 108 (90 Base) MCG/ACT inhaler Inhale 2 puffs into the lungs every 6 (six) hours as needed for wheezing or shortness of breath.  . blood glucose meter kit and supplies Dispense based on patient and insurance preference. Use up to four times daily as directed. (FOR  ICD-9 250.00, 250.01).  . Blood Glucose Monitoring Suppl (GLUCOCOM BLOOD GLUCOSE MONITOR) DEVI Check blood sugar as directed  . fluticasone (FLONASE) 50 MCG/ACT nasal spray Place 1 spray into both nostrils daily as needed for allergies.   Marland Kitchen gabapentin (NEURONTIN) 600 MG tablet Take 1,200 mg by mouth 2 (two) times daily.  Marland Kitchen glucose blood (ACCU-CHEK GUIDE) test strip Use as instructed  . insulin aspart (NOVOLOG) 100 UNIT/ML injection Inject 35 Units into the skin 3 (three) times daily with meals. (Patient taking differently: Inject 25-50 Units into the skin See admin instructions. 3-4 times daily.  "Sliding scale plus 20 units.")  . Insulin Degludec (TRESIBA FLEXTOUCH) 200 UNIT/ML SOPN Inject 100 Units into the skin every morning.  . Insulin Pen Needle (FIFTY50 PEN NEEDLES) 31G X 5 MM MISC USE ONE NEEDLE WITH EACH INSULIN INJECTION 6 TIMES PER DAY. E10.65  . linaclotide (LINZESS) 145 MCG CAPS capsule Take 145 mcg by mouth daily as needed (irritable bowel syndrome).  Marland Kitchen lisinopril (ZESTRIL) 30 MG tablet Take 30 mg by mouth daily.  Marland Kitchen loratadine (CLARITIN) 10 MG tablet Take 1 tablet (10 mg total) by mouth daily.  . metFORMIN (GLUCOPHAGE) 1000 MG tablet Take 1,000 mg by mouth 2 (two) times daily with a meal.  . pantoprazole (PROTONIX) 40 MG tablet Take 1 tablet (40 mg total) by mouth daily at 6 (six) AM.  . tiZANidine (ZANAFLEX) 4 MG tablet Take 4 mg by mouth every 8 (eight) hours  as needed for muscle spasms.    No current facility-administered medications for this visit.  (Other)      REVIEW OF SYSTEMS: ROS    Positive for: Endocrine, Eyes   Negative for: Constitutional, Gastrointestinal, Neurological, Skin, Genitourinary, Musculoskeletal, HENT, Cardiovascular, Respiratory, Psychiatric, Allergic/Imm, Heme/Lymph   Last edited by Doneen Poisson on 07/31/2019  8:48 AM. (History)       ALLERGIES Allergies  Allergen Reactions  . Morphine And Related Nausea And Vomiting    PAST MEDICAL  HISTORY Past Medical History:  Diagnosis Date  . Asthma   . Cellulitis of left foot   . Diabetes mellitus without complication ([place])   . Diabetic retinopathy ([place])    PDR OU  . GERD (gastroesophageal reflux disease) 10/27/2017  . H/O seasonal allergies   . Hypertension   . Hypertensive retinopathy    OU  . IBS (irritable bowel syndrome)   . Proliferative diabetic retinopathy ([place])    with vitreous hemorrhage and tractional retinal detachment, left eye  . RLS (restless legs syndrome) 10/27/2017  . Wears glasses    Past Surgical History:  Procedure Laterality Date  . AMPUTATION Right 11/01/2017   Procedure: RIGHT FOOT 5TH RAY AMPUTATION;  Surgeon: Newt Minion, MD;  Location: [facility];  Service: Orthopedics;  Laterality: Right;  . CESAREAN SECTION    . CHOLECYSTECTOMY    . CYST EXCISION    . INJECTION OF SILICONE OIL Left 1/47/0929   Procedure: Injection Of Silicone Oil;  Surgeon: Bernarda Caffey, MD;  Location: [facility];  Service: Ophthalmology;  Laterality: Left;  . MEMBRANE PEEL Left 07/23/2019   Procedure: Antoine Primas;  Surgeon: Bernarda Caffey, MD;  Location: [facility];  Service: Ophthalmology;  Laterality: Left;  . PARS PLANA VITRECTOMY Left 07/23/2019   Procedure: [place] Vitrectomy With 25 Gauge;  Surgeon: Bernarda Caffey, MD;  Location: [facility];  Service: Ophthalmology;  Laterality: Left;  . PHOTOCOAGULATION WITH LASER Left 07/23/2019   Procedure: Photocoagulation With Laser;  Surgeon: Bernarda Caffey, MD;  Location: [facility];  Service: Ophthalmology;  Laterality: Left;  . REPAIR OF COMPLEX TRACTION RETINAL DETACHMENT Left 07/23/2019   Procedure: REPAIR OF COMPLEX TRACTION RETINAL DETACHMENT;  Surgeon: Bernarda Caffey, MD;  Location: [facility];  Service: Ophthalmology;  Laterality: Left;  . TONSILLECTOMY    . TOOTH EXTRACTION      FAMILY HISTORY Family History  Problem Relation Age of Onset  . Diabetes Mother   . Cancer Mother   . Irritable bowel syndrome Mother   . Hypertension Mother   .  Sleep apnea Mother   . Diabetes Father   . Restless legs syndrome Father     SOCIAL HISTORY Social History   Tobacco Use  . Smoking status: Never Smoker  . Smokeless tobacco: Never Used  Substance Use Topics  . Alcohol use: No  . Drug use: No         OPHTHALMIC EXAM:  Base Eye Exam    Visual Acuity (Snellen - Linear)      Right Left   Dist cc 20/30 -1 CF @ face   Dist ph cc 20/25 -2 NI   Correction: Glasses       Tonometry (Tonopen, 8:53 AM)      Right Left   Pressure 16 16       Pupils      Dark Light Shape React APD   Right 4 3 Round Brisk 0   Left 6 6 Round dilated 0  Visual Fields      Left Right     Full   Restrictions Total inferior temporal, superior nasal, inferior nasal deficiencies; Partial inner superior temporal deficiency        Extraocular Movement      Right Left    Full Full       Neuro/Psych    Oriented x3: Yes   Mood/Affect: Normal       Dilation    Both eyes: 1.0% Mydriacyl, 2.5% Phenylephrine @ 8:53 AM        Slit Lamp and Fundus Exam    Slit Lamp Exam      Right Left   Lids/Lashes Dermatochalasis - upper lid, mild Meibomian gland dysfunction Dermatochalasis - upper lid, mild Meibomian gland dysfunction, periorbital edema   Conjunctiva/Sclera White and quiet White and quiet   Cornea trace Punctate epithelial erosions 1+ Descemet's folds   Anterior Chamber deep, narrow temporal angle Shallow, clear   Iris Round and moderately dilated to 47m, No NVI Round and moderately dilated to 6.580m No NVI   Lens Clear Trace Cortical cataract   Vitreous mild Vitreous syneresis post vitrectomy; good silicon oil fill       Fundus Exam      Right Left   Disc Pink and Sharp, +fine NVD temporally and superiorly improved fibrosis   C/D Ratio 0.2    Macula Blunted foveal reflex, +central cystic changes, +ERM, scattered MA/IRH, +NVE along ST arcades, new pre-retinal / sub-hyaloid heme centrally Persistent shallow SRF / RD under oil, mild  residual fibrosis   Vessels Vascular attenuation, Tortuous, +NV, +venus beading IN arcades, fine NVE along ST arcades Attenuated; tortuous; improved fibrosis   Periphery Attached, scattered DBH/CWS nasally  Persistent SRF / shallow RD under oil; good 360 PRP; improved fibrosis        Refraction    Wearing Rx      Sphere Cylinder Axis   Right -4.50 +1.75 018   Left -5.00 +1.00 124   Type: SVL          IMAGING AND PROCEDURES  Imaging and Procedures for '@TODAY' @  OCT, Retina - OU - Both Eyes       Right Eye Quality was good. Central Foveal Thickness: 337. Progression has been stable. Findings include no SRF, intraretinal fluid, vitreomacular adhesion , epiretinal membrane, abnormal foveal contour (Interval improvement in IRF/foveal profile; mild interval improvement in perifoveal sub-hyaloid heme).   Left Eye Quality was borderline. Central Foveal Thickness: 446. Progression has been stable. Findings include epiretinal membrane, subretinal hyper-reflective material, intraretinal fluid, subretinal fluid (Persistent SRF under oil).   Notes *Images captured and stored on drive  Diagnosis / Impression:  OD: abnormal foveal contour; Interval improvement in IRF/foveal profile; interval improvement in perifoveal sub-hyaloid heme OS: preretinal fibrosis and tractional membranes improved; shallow persistent SRF    Clinical management:  See below  Abbreviations: NFP - Normal foveal profile. CME - cystoid macular edema. PED - pigment epithelial detachment. IRF - intraretinal fluid. SRF - subretinal fluid. EZ - ellipsoid zone. ERM - epiretinal membrane. ORA - outer retinal atrophy. ORT - outer retinal tubulation. SRHM - subretinal hyper-reflective material                 ASSESSMENT/PLAN:    ICD-10-CM   1. Proliferative diabetic retinopathy of both eyes with macular edema associated with type 2 diabetes mellitus ([facility] [id number] 2. Retinal edema  H35.81 OCT, Retina - OU -  Both Eyes  3. Vitreous  hemorrhage of left eye ([place])  H43.12   4. 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 Essential hypertension  I10   6. Hypertensive retinopathy of both eyes  H35.033     1-4. Proliferative diabetic retinopathy with macular edema OU (OS > OD)  - OS with acute decrease in vision from vitreous/subhyaloid heme  - initial exam showed large preretinal/subhyaloid heme OS and scattered fibrosis w/ NV; OD with scattered intraretinal DBH and flat NV  - FA (07.06.20) shows NV and capillary nonperfusion OU  - s/p IVA OS (07.06.20), #2 (08.03.20)  - s/p IVA OD #1 (08.03.20)  - s/p PRP OS (07.06.20)  - s/p PRP OD (08.03.20)  - OS with progressive increase in VH and tractional fibrosis pre op  - now POW1 s/p PPV/MP/EL/silicone oil + IVA OS [phone]             - good silicone oil bubble in place  - tractional membranes and fibrosis improved, but shallow residual SRF persists posteriorly             - IOP good at 16             - cont   PF 4x/day OS                          zymaxid QID OS -- stop when bottle runs out                         Atropine BID OS -- stop when bottle runs out   Brimonidine BID OS                         Cosopt BID OS                         PSO ung QID OS              - cont face down positioning 50% of time; avoid laying flat on back              - eye shield when sleeping              - post op drop and positioning instructions reviewed              - tylenol/ibuprofen for pain  - f/u 2-3 weeks -- may need repeat PPV if SRF fails to improve   5,6. Hypertensive retinopathy OU  - discussed importance of tight BP control  - monitor     Ophthalmic Meds Ordered this visit:  Meds ordered this encounter  Medications  . prednisoLONE acetate (PRED FORTE) 1 % ophthalmic suspension    Sig: Place 1 drop into the left eye 4 (four) times daily.    Dispense:  10 mL    Refill:  0       Return for f/u 2-3 weeks POV .  There are no Patient  Instructions on file for this visit.   Explained the diagnoses, plan, and follow up with the patient and they expressed understanding.  Patient expressed understanding of the importance of proper follow up care.   This document serves as a record of services personally performed by Gardiner Sleeper, MD, PhD. It was created on their behalf by Ernest Mallick, OA, an ophthalmic assistant. The creation of this record is the provider's dictation and/or  activities during the visit.    Electronically signed by: Ernest Mallick, OA  09.04.2020 8:13 AM    Gardiner Sleeper, M.D., Ph.D. Diseases & Surgery of the Retina and Vitreous [facility]  I have reviewed the above documentation for accuracy and completeness, and I agree with the above. Gardiner Sleeper, M.D., Ph.D. 08/04/19 8:13 AM   Abbreviations: M myopia (nearsighted); A astigmatism; H hyperopia (farsighted); P presbyopia; Mrx spectacle prescription;  CTL contact lenses; OD right eye; OS left eye; OU both eyes  XT exotropia; ET esotropia; PEK punctate epithelial keratitis; PEE punctate epithelial erosions; DES dry eye syndrome; MGD meibomian gland dysfunction; ATs artificial tears; PFAT's preservative free artificial tears; [place] nuclear sclerotic cataract; PSC posterior subcapsular cataract; ERM epi-retinal membrane; PVD posterior vitreous detachment; RD retinal detachment; DM diabetes mellitus; DR diabetic retinopathy; NPDR non-proliferative diabetic retinopathy; PDR proliferative diabetic retinopathy; CSME clinically significant macular edema; DME diabetic macular edema; dbh dot blot hemorrhages; CWS cotton wool spot; POAG primary open angle glaucoma; C/D cup-to-disc ratio; HVF humphrey visual field; GVF goldmann visual field; OCT optical coherence tomography; IOP intraocular pressure; BRVO Branch retinal vein occlusion; CRVO central retinal vein occlusion; CRAO central retinal artery occlusion; BRAO branch retinal artery occlusion;  RT retinal tear; SB scleral buckle; PPV pars plana vitrectomy; VH Vitreous hemorrhage; PRP panretinal laser photocoagulation; IVK intravitreal kenalog; VMT vitreomacular traction; MH Macular hole;  NVD neovascularization of the disc; NVE neovascularization elsewhere; AREDS age related eye disease study; ARMD age related macular degeneration; POAG primary open angle glaucoma; EBMD epithelial/anterior basement membrane dystrophy; ACIOL anterior chamber intraocular lens; IOL intraocular lens; PCIOL posterior chamber intraocular lens; Phaco/IOL phacoemulsification with intraocular lens placement; [place] photorefractive keratectomy; LASIK laser assisted in situ keratomileusis; HTN hypertension; DM diabetes mellitus; COPD chronic obstructive pulmonary disease

## 2019-08-04 ENCOUNTER — Encounter (INDEPENDENT_AMBULATORY_CARE_PROVIDER_SITE_OTHER): Payer: Self-pay | Admitting: Ophthalmology

## 2019-08-05 ENCOUNTER — Encounter (INDEPENDENT_AMBULATORY_CARE_PROVIDER_SITE_OTHER): Payer: Medicaid Other | Admitting: Ophthalmology

## 2019-08-14 ENCOUNTER — Encounter (INDEPENDENT_AMBULATORY_CARE_PROVIDER_SITE_OTHER): Payer: Medicaid Other | Admitting: Ophthalmology

## 2019-08-16 NOTE — Progress Notes (Signed)
[facility] Note  08/17/2019     CHIEF COMPLAINT Patient presents for Post-op Follow-up   HISTORY OF PRESENT ILLNESS: Lindsay Love is a 38 y.o. female who presents to the clinic today for:   HPI    Post-op Follow-up    In left eye.  Discomfort includes pain.  Vision is stable.  I, the attending physician,  performed the HPI with the patient and updated documentation appropriately.          Comments    BS: 209 last night Patient states her vision OS is about the same.  She complains of occasional pain OS only.  Patient denies any new or worsening floaters or fol OU.       Last edited by Bernarda Caffey, MD on 08/17/2019 12:58 PM. (History)    pt states   Referring physician: Robert Bellow, PA-C [facility] [address],  [place] [address]  HISTORICAL INFORMATION:   Selected notes from the MEDICAL RECORD NUMBER Referred by Dr. Maryjane Hurter for concern of diabetic retinopathy LEE: 07.02.20 (M. Le) [BCVA: OD: 20/40 OS: LP] Ocular Hx-retinal traction, CME OS, optic nerve edema OS PMH-DM (A1c: 13.0 (12.02.18), takes tresiba/novolog), ashthma   CURRENT MEDICATIONS: Current Outpatient Medications (Ophthalmic Drugs)  Medication Sig  . bacitracin-polymyxin b (POLYSPORIN) ophthalmic ointment Place into the left eye at bedtime as needed for up to 10 days. Place a 1/2 inch ribbon of ointment into the lower eyelid.  . prednisoLONE acetate (PRED FORTE) 1 % ophthalmic suspension Place 1 drop into the left eye 4 (four) times daily.   No current facility-administered medications for this visit.  (Ophthalmic Drugs)   Current Outpatient Medications (Other)  Medication Sig  . ACCU-CHEK FASTCLIX LANCETS MISC USE TO TEST BLOOD SUGAR 5 TIMES DAILY  . albuterol (PROVENTIL HFA;VENTOLIN HFA) 108 (90 Base) MCG/ACT inhaler Inhale 2 puffs into the lungs every 6 (six) hours as needed for wheezing or shortness of breath.  . blood glucose meter kit and supplies  Dispense based on patient and insurance preference. Use up to four times daily as directed. (FOR ICD-9 250.00, 250.01).  . Blood Glucose Monitoring Suppl (GLUCOCOM BLOOD GLUCOSE MONITOR) DEVI Check blood sugar as directed  . fluticasone (FLONASE) 50 MCG/ACT nasal spray Place 1 spray into both nostrils daily as needed for allergies.   Marland Kitchen gabapentin (NEURONTIN) 600 MG tablet Take 1,200 mg by mouth 2 (two) times daily.  Marland Kitchen glucose blood (ACCU-CHEK GUIDE) test strip Use as instructed  . insulin aspart (NOVOLOG) 100 UNIT/ML injection Inject 35 Units into the skin 3 (three) times daily with meals. (Patient taking differently: Inject 25-50 Units into the skin See admin instructions. 3-4 times daily.  "Sliding scale plus 20 units.")  . Insulin Degludec (TRESIBA FLEXTOUCH) 200 UNIT/ML SOPN Inject 100 Units into the skin every morning.  . Insulin Pen Needle (FIFTY50 PEN NEEDLES) 31G X 5 MM MISC USE ONE NEEDLE WITH EACH INSULIN INJECTION 6 TIMES PER DAY. E10.65  . linaclotide (LINZESS) 145 MCG CAPS capsule Take 145 mcg by mouth daily as needed (irritable bowel syndrome).  Marland Kitchen lisinopril (ZESTRIL) 30 MG tablet Take 30 mg by mouth daily.  Marland Kitchen loratadine (CLARITIN) 10 MG tablet Take 1 tablet (10 mg total) by mouth daily.  . metFORMIN (GLUCOPHAGE) 1000 MG tablet Take 1,000 mg by mouth 2 (two) times daily with a meal.  . pantoprazole (PROTONIX) 40 MG tablet Take 1 tablet (40 mg total) by mouth daily at 6 (  six) AM.  . tiZANidine (ZANAFLEX) 4 MG tablet Take 4 mg by mouth every 8 (eight) hours as needed for muscle spasms.    No current facility-administered medications for this visit.  (Other)      REVIEW OF SYSTEMS: ROS    Positive for: Endocrine, Eyes   Negative for: Constitutional, Gastrointestinal, Neurological, Skin, Genitourinary, Musculoskeletal, HENT, Cardiovascular, Respiratory, Psychiatric, Allergic/Imm, Heme/Lymph   Last edited by Doneen Poisson on 08/17/2019  9:42 AM. (History)        ALLERGIES Allergies  Allergen Reactions  . Morphine And Related Nausea And Vomiting    PAST MEDICAL HISTORY Past Medical History:  Diagnosis Date  . Asthma   . Cellulitis of left foot   . Diabetes mellitus without complication ([facility])   . Diabetic retinopathy ([facility])    PDR OU  . GERD (gastroesophageal reflux disease) 10/27/2017  . H/O seasonal allergies   . Hypertension   . Hypertensive retinopathy    OU  . IBS (irritable bowel syndrome)   . Proliferative diabetic retinopathy ([place])    with vitreous hemorrhage and tractional retinal detachment, left eye  . RLS (restless legs syndrome) 10/27/2017  . Wears glasses    Past Surgical History:  Procedure Laterality Date  . AMPUTATION Right 11/01/2017   Procedure: RIGHT FOOT 5TH RAY AMPUTATION;  Surgeon: Newt Minion, MD;  Location: [facility];  Service: Orthopedics;  Laterality: Right;  . CESAREAN SECTION    . CHOLECYSTECTOMY    . CYST EXCISION    . INJECTION OF SILICONE OIL Left 04/05/210   Procedure: Injection Of Silicone Oil;  Surgeon: Bernarda Caffey, MD;  Location: [facility];  Service: Ophthalmology;  Laterality: Left;  . MEMBRANE PEEL Left 07/23/2019   Procedure: Antoine Primas;  Surgeon: Bernarda Caffey, MD;  Location: [facility];  Service: Ophthalmology;  Laterality: Left;  . PARS PLANA VITRECTOMY Left 07/23/2019   Procedure: [place] Vitrectomy With 25 Gauge;  Surgeon: Bernarda Caffey, MD;  Location: [facility];  Service: Ophthalmology;  Laterality: Left;  . PHOTOCOAGULATION WITH LASER Left 07/23/2019   Procedure: Photocoagulation With Laser;  Surgeon: Bernarda Caffey, MD;  Location: [facility];  Service: Ophthalmology;  Laterality: Left;  . REPAIR OF COMPLEX TRACTION RETINAL DETACHMENT Left 07/23/2019   Procedure: REPAIR OF COMPLEX TRACTION RETINAL DETACHMENT;  Surgeon: Bernarda Caffey, MD;  Location: [facility];  Service: Ophthalmology;  Laterality: Left;  . TONSILLECTOMY    . TOOTH EXTRACTION      FAMILY HISTORY Family History  Problem Relation Age of  Onset  . Diabetes Mother   . Cancer Mother   . Irritable bowel syndrome Mother   . Hypertension Mother   . Sleep apnea Mother   . Diabetes Father   . Restless legs syndrome Father     SOCIAL HISTORY Social History   Tobacco Use  . Smoking status: Never Smoker  . Smokeless tobacco: Never Used  Substance Use Topics  . Alcohol use: No  . Drug use: No         OPHTHALMIC EXAM:  Base Eye Exam    Visual Acuity (Snellen - Linear)      Right Left   Dist cc 20/30 +2 CF @ face   Dist ph cc NI NI   Correction: Glasses       Tonometry (Tonopen, 9:47 AM)      Right Left   Pressure 16 22       Pupils      Dark Light Shape React APD  Right 3 2 Round Brisk 0   Left 6 5 Round Minimal 0       Visual Fields      Left Right   Restrictions Total inferior temporal, superior nasal, inferior nasal deficiencies; Partial inner superior temporal deficiency        Extraocular Movement      Right Left    Full Full       Neuro/Psych    Oriented x3: Yes   Mood/Affect: Normal       Dilation    Both eyes: 1.0% Mydriacyl, 2.5% Phenylephrine @ 9:47 AM       Dilation #2    Both eyes: 1.0% Mydriacyl, 2.5% Phenylephrine @ 2:05 PM        Slit Lamp and Fundus Exam    Slit Lamp Exam      Right Left   Lids/Lashes Dermatochalasis - upper lid, mild Meibomian gland dysfunction Dermatochalasis - upper lid, mild Meibomian gland dysfunction, periorbital edema   Conjunctiva/Sclera White and quiet Focal Injection around dissolving sutures, otherwise clear   Cornea trace Punctate epithelial erosions Clear; 1+ PEE   Anterior Chamber deep, narrow temporal angle Shallow, clear   Iris Round and moderately dilated to 26m, No NVI Round and moderately dilated to 6.533m No NVI   Lens Clear Trace Cortical cataract   Vitreous mild Vitreous syneresis post vitrectomy; good silicon oil fill       Fundus Exam      Right Left   Disc Pink and Sharp, +fine NVD temporally and superiorly improved  fibrosis, mild Pallor, Sharp rim   C/D Ratio 0.2    Macula Blunted foveal reflex, +central cystic changes, +ERM, scattered MA/IRH, +NVE along ST arcades, new pre-retinal / sub-hyaloid heme centrally residual SRF - slightly improved from prior, mild ERM, mild residual fibrosis improving   Vessels Vascular attenuation, Tortuous, +NV, +venus beading IN arcades, fine NVE along ST arcades Attenuated; tortuous; improved fibrosis   Periphery Attached, scattered DBH/CWS nasally  Persistent SRF / shallow RD posteriorly under oil; good 360 PRP; improved fibrosis        Refraction    Wearing Rx      Sphere Cylinder Axis   Right -4.50 +1.75 018   Left -5.00 +1.00 124   Type: SVL          IMAGING AND PROCEDURES  Imaging and Procedures for _0 @  OCT, Retina - OU - Both Eyes       Right Eye Quality was good. Central Foveal Thickness: 338. Progression has been stable. Findings include no SRF, vitreomacular adhesion , epiretinal membrane, abnormal foveal contour, no IRF (mild interval improvement in perifoveal sub-hyaloid heme, mild, non-central cystic changes).   Left Eye Quality was good. Central Foveal Thickness: 748. Progression has improved. Findings include epiretinal membrane, subretinal hyper-reflective material, intraretinal fluid, subretinal fluid, abnormal foveal contour (Mild interval improvement in IRF/ SRF under oil).   Notes *Images captured and stored on drive  Diagnosis / Impression:  OD: abnormal foveal contour; mild interval improvement in perifoveal sub-hyaloid heme, mild, non-central cystic changes OS: preretinal fibrosis and tractional membranes improved; Mild interval improvement in IRF/SRF under oil   Clinical management:  See below  Abbreviations: NFP - Normal foveal profile. CME - cystoid macular edema. PED - pigment epithelial detachment. IRF - intraretinal fluid. SRF - subretinal fluid. EZ - ellipsoid zone. ERM - epiretinal membrane. ORA - outer retinal  atrophy. ORT - outer retinal tubulation. SRHM - subretinal hyper-reflective material  ASSESSMENT/PLAN:    ICD-10-CM   1. Proliferative diabetic retinopathy of both eyes with macular edema associated with type 2 diabetes mellitus ([place])  T01.6010   2. Retinal edema  H35.81 OCT, Retina - OU - Both Eyes  3. Vitreous hemorrhage of left eye (H[facility])  H43.12   4. 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 Essential hypertension  I10   6. Hypertensive retinopathy of both eyes  H35.033     1-4. Proliferative diabetic retinopathy with macular edema OU (OS > OD)  - OS with acute decrease in vision from vitreous/subhyaloid heme  - initial exam showed large preretinal/subhyaloid heme OS and scattered fibrosis w/ NV; OD with scattered intraretinal DBH and flat NV  - FA (07.06.20) shows NV and capillary nonperfusion OU  - s/p IVA OS (07.06.20), #2 (08.03.20)  - s/p IVA OD #1 (08.03.20)  - s/p PRP OS (07.06.20)  - s/p PRP OD (08.03.20)  - OS with progressive increase in VH and tractional fibrosis pre op  - now POW3 s/p PPV/MP/EL/silicone oil + IVA OS [phone]             - good silicone oil bubble in place  - tractional membranes and fibrosis improved, but shallow residual SRF persists posteriorly -- mild interval improvement from prior             - IOP 22 OS             - decrease PF to TID OS   - increase Brimonidine to TID OS  - cont Cosopt BID OS             - PSO ung OS -- bedtime / PRN             - cont face down positioning 50% of time; avoid laying flat on back              - eye shield when sleeping              - post op drop and positioning instructions reviewed              - tylenol/ibuprofen for pain  - f/u 3 weeks -- may need repeat PPV if SRF fails to improve   5,6. Hypertensive retinopathy OU  - discussed importance of tight BP control  - monitor     Ophthalmic Meds Ordered this visit:  Meds ordered this encounter  Medications  .  bacitracin-polymyxin b (POLYSPORIN) ophthalmic ointment    Sig: Place into the left eye at bedtime as needed for up to 10 days. Place a 1/2 inch ribbon of ointment into the lower eyelid.    Dispense:  3.5 g    Refill:  3       Return in about 3 weeks (around 09/07/2019) for f/u PDR OU, DFE, OCT.  There are no Patient Instructions on file for this visit.   Explained the diagnoses, plan, and follow up with the patient and they expressed understanding.  Patient expressed understanding of the importance of proper follow up care.   This document serves as a record of services personally performed by Gardiner Sleeper, MD, PhD. It was created on their behalf by Ernest Mallick, OA, an ophthalmic assistant. The creation of this record is the provider's dictation and/or activities during the visit.    Electronically signed by: Ernest Mallick, OA  09.20.2020 4:03 PM     Gardiner Sleeper, M.D., Ph.D. Diseases & Surgery of  the Retina and Vitreous [facility]  I have reviewed the above documentation for accuracy and completeness, and I agree with the above. Gardiner Sleeper, M.D., Ph.D. 08/17/19 4:03 PM    Abbreviations: M myopia (nearsighted); A astigmatism; H hyperopia (farsighted); P presbyopia; Mrx spectacle prescription;  CTL contact lenses; OD right eye; OS left eye; OU both eyes  XT exotropia; ET esotropia; PEK punctate epithelial keratitis; PEE punctate epithelial erosions; DES dry eye syndrome; MGD meibomian gland dysfunction; ATs artificial tears; PFAT's preservative free artificial tears; [place] nuclear sclerotic cataract; PSC posterior subcapsular cataract; ERM epi-retinal membrane; PVD posterior vitreous detachment; RD retinal detachment; DM diabetes mellitus; DR diabetic retinopathy; NPDR non-proliferative diabetic retinopathy; PDR proliferative diabetic retinopathy; CSME clinically significant macular edema; DME diabetic macular edema; dbh dot blot hemorrhages; CWS cotton  wool spot; POAG primary open angle glaucoma; C/D cup-to-disc ratio; HVF humphrey visual field; GVF goldmann visual field; OCT optical coherence tomography; IOP intraocular pressure; BRVO Branch retinal vein occlusion; CRVO central retinal vein occlusion; CRAO central retinal artery occlusion; BRAO branch retinal artery occlusion; RT retinal tear; SB scleral buckle; PPV pars plana vitrectomy; VH Vitreous hemorrhage; PRP panretinal laser photocoagulation; IVK intravitreal kenalog; VMT vitreomacular traction; MH Macular hole;  NVD neovascularization of the disc; NVE neovascularization elsewhere; AREDS age related eye disease study; ARMD age related macular degeneration; POAG primary open angle glaucoma; EBMD epithelial/anterior basement membrane dystrophy; ACIOL anterior chamber intraocular lens; IOL intraocular lens; PCIOL posterior chamber intraocular lens; Phaco/IOL phacoemulsification with intraocular lens placement; [place] photorefractive keratectomy; LASIK laser assisted in situ keratomileusis; HTN hypertension; DM diabetes mellitus; COPD chronic obstructive pulmonary disease

## 2019-08-17 ENCOUNTER — Other Ambulatory Visit: Payer: Self-pay

## 2019-08-17 ENCOUNTER — Encounter (INDEPENDENT_AMBULATORY_CARE_PROVIDER_SITE_OTHER): Payer: Self-pay | Admitting: Ophthalmology

## 2019-08-17 ENCOUNTER — Ambulatory Visit (INDEPENDENT_AMBULATORY_CARE_PROVIDER_SITE_OTHER): Payer: Medicaid Other | Admitting: Ophthalmology

## 2019-08-17 DIAGNOSIS — H4312 Vitreous hemorrhage, left eye: Secondary | ICD-10-CM

## 2019-08-17 DIAGNOSIS — H3342 Traction detachment of retina, left eye: Secondary | ICD-10-CM

## 2019-08-17 DIAGNOSIS — H3581 Retinal edema: Secondary | ICD-10-CM

## 2019-08-17 DIAGNOSIS — I10 Essential (primary) hypertension: Secondary | ICD-10-CM

## 2019-08-17 DIAGNOSIS — E113513 Type 2 diabetes mellitus with proliferative diabetic retinopathy with macular edema, bilateral: Secondary | ICD-10-CM

## 2019-08-17 DIAGNOSIS — H35033 Hypertensive retinopathy, bilateral: Secondary | ICD-10-CM

## 2019-08-17 MED ORDER — BACITRACIN-POLYMYXIN B 500-10000 UNIT/GM OP OINT: TOPICAL_OINTMENT | Freq: Every evening | OPHTHALMIC | 3 refills | Status: AC | PRN

## 2019-08-27 ENCOUNTER — Other Ambulatory Visit: Payer: Self-pay

## 2019-08-27 ENCOUNTER — Inpatient Hospital Stay (HOSPITAL_BASED_OUTPATIENT_CLINIC_OR_DEPARTMENT_OTHER)
Admission: EM | Admit: 2019-08-27 | Discharge: 2019-08-31 | DRG: 603 | Disposition: A | Discharge status: Home / Self Care | Payer: Medicaid Other | Attending: Family Medicine | Admitting: Family Medicine

## 2019-08-27 ENCOUNTER — Encounter (HOSPITAL_BASED_OUTPATIENT_CLINIC_OR_DEPARTMENT_OTHER): Payer: Self-pay

## 2019-08-27 ENCOUNTER — Emergency Department (HOSPITAL_BASED_OUTPATIENT_CLINIC_OR_DEPARTMENT_OTHER): Payer: Medicaid Other

## 2019-08-27 DIAGNOSIS — E113593 Type 2 diabetes mellitus with proliferative diabetic retinopathy without macular edema, bilateral: Secondary | ICD-10-CM | POA: Diagnosis present

## 2019-08-27 DIAGNOSIS — E1142 Type 2 diabetes mellitus with diabetic polyneuropathy: Secondary | ICD-10-CM | POA: Diagnosis present

## 2019-08-27 DIAGNOSIS — L97519 Non-pressure chronic ulcer of other part of right foot with unspecified severity: Secondary | ICD-10-CM | POA: Diagnosis present

## 2019-08-27 DIAGNOSIS — E1143 Type 2 diabetes mellitus with diabetic autonomic (poly)neuropathy: Secondary | ICD-10-CM | POA: Diagnosis present

## 2019-08-27 DIAGNOSIS — K589 Irritable bowel syndrome without diarrhea: Secondary | ICD-10-CM | POA: Diagnosis present

## 2019-08-27 DIAGNOSIS — N183 Chronic kidney disease, stage 3 unspecified: Secondary | ICD-10-CM | POA: Diagnosis present

## 2019-08-27 DIAGNOSIS — R1084 Generalized abdominal pain: Secondary | ICD-10-CM | POA: Diagnosis not present

## 2019-08-27 DIAGNOSIS — N83202 Unspecified ovarian cyst, left side: Secondary | ICD-10-CM | POA: Diagnosis not present

## 2019-08-27 DIAGNOSIS — K3184 Gastroparesis: Secondary | ICD-10-CM | POA: Diagnosis present

## 2019-08-27 DIAGNOSIS — M869 Osteomyelitis, unspecified: Secondary | ICD-10-CM | POA: Diagnosis present

## 2019-08-27 DIAGNOSIS — Z6841 Body Mass Index (BMI) 40.0 and over, adult: Secondary | ICD-10-CM

## 2019-08-27 DIAGNOSIS — I10 Essential (primary) hypertension: Secondary | ICD-10-CM | POA: Diagnosis present

## 2019-08-27 DIAGNOSIS — D638 Anemia in other chronic diseases classified elsewhere: Secondary | ICD-10-CM | POA: Diagnosis present

## 2019-08-27 DIAGNOSIS — Z79899 Other long term (current) drug therapy: Secondary | ICD-10-CM

## 2019-08-27 DIAGNOSIS — M6701 Short Achilles tendon (acquired), right ankle: Secondary | ICD-10-CM | POA: Diagnosis present

## 2019-08-27 DIAGNOSIS — Z973 Presence of spectacles and contact lenses: Secondary | ICD-10-CM

## 2019-08-27 DIAGNOSIS — E11621 Type 2 diabetes mellitus with foot ulcer: Secondary | ICD-10-CM | POA: Diagnosis present

## 2019-08-27 DIAGNOSIS — R109 Unspecified abdominal pain: Secondary | ICD-10-CM | POA: Diagnosis present

## 2019-08-27 DIAGNOSIS — Z833 Family history of diabetes mellitus: Secondary | ICD-10-CM

## 2019-08-27 DIAGNOSIS — E11610 Type 2 diabetes mellitus with diabetic neuropathic arthropathy: Secondary | ICD-10-CM | POA: Diagnosis not present

## 2019-08-27 DIAGNOSIS — Z809 Family history of malignant neoplasm, unspecified: Secondary | ICD-10-CM

## 2019-08-27 DIAGNOSIS — H35039 Hypertensive retinopathy, unspecified eye: Secondary | ICD-10-CM | POA: Diagnosis present

## 2019-08-27 DIAGNOSIS — Z20828 Contact with and (suspected) exposure to other viral communicable diseases: Secondary | ICD-10-CM | POA: Diagnosis present

## 2019-08-27 DIAGNOSIS — K219 Gastro-esophageal reflux disease without esophagitis: Secondary | ICD-10-CM | POA: Diagnosis present

## 2019-08-27 DIAGNOSIS — L03115 Cellulitis of right lower limb: Secondary | ICD-10-CM | POA: Diagnosis present

## 2019-08-27 DIAGNOSIS — Z7951 Long term (current) use of inhaled steroids: Secondary | ICD-10-CM

## 2019-08-27 DIAGNOSIS — Z794 Long term (current) use of insulin: Secondary | ICD-10-CM

## 2019-08-27 DIAGNOSIS — J4530 Mild persistent asthma, uncomplicated: Secondary | ICD-10-CM | POA: Diagnosis not present

## 2019-08-27 DIAGNOSIS — L089 Local infection of the skin and subcutaneous tissue, unspecified: Secondary | ICD-10-CM

## 2019-08-27 DIAGNOSIS — N179 Acute kidney failure, unspecified: Secondary | ICD-10-CM

## 2019-08-27 DIAGNOSIS — E1122 Type 2 diabetes mellitus with diabetic chronic kidney disease: Secondary | ICD-10-CM | POA: Diagnosis present

## 2019-08-27 DIAGNOSIS — J45909 Unspecified asthma, uncomplicated: Secondary | ICD-10-CM | POA: Diagnosis present

## 2019-08-27 DIAGNOSIS — I129 Hypertensive chronic kidney disease with stage 1 through stage 4 chronic kidney disease, or unspecified chronic kidney disease: Secondary | ICD-10-CM | POA: Diagnosis present

## 2019-08-27 DIAGNOSIS — E669 Obesity, unspecified: Secondary | ICD-10-CM | POA: Diagnosis present

## 2019-08-27 DIAGNOSIS — Z7952 Long term (current) use of systemic steroids: Secondary | ICD-10-CM

## 2019-08-27 DIAGNOSIS — L0390 Cellulitis, unspecified: Secondary | ICD-10-CM | POA: Diagnosis not present

## 2019-08-27 DIAGNOSIS — Z89421 Acquired absence of other right toe(s): Secondary | ICD-10-CM

## 2019-08-27 DIAGNOSIS — N1831 Chronic kidney disease, stage 3a: Secondary | ICD-10-CM | POA: Diagnosis not present

## 2019-08-27 DIAGNOSIS — E1169 Type 2 diabetes mellitus with other specified complication: Secondary | ICD-10-CM | POA: Diagnosis present

## 2019-08-27 DIAGNOSIS — G2581 Restless legs syndrome: Secondary | ICD-10-CM | POA: Diagnosis present

## 2019-08-27 DIAGNOSIS — N83209 Unspecified ovarian cyst, unspecified side: Secondary | ICD-10-CM | POA: Diagnosis not present

## 2019-08-27 DIAGNOSIS — E860 Dehydration: Secondary | ICD-10-CM | POA: Diagnosis present

## 2019-08-27 DIAGNOSIS — Z8249 Family history of ischemic heart disease and other diseases of the circulatory system: Secondary | ICD-10-CM

## 2019-08-27 LAB — CBC
HCT: 35.2 % — ABNORMAL LOW (ref 36.0–46.0)
Hemoglobin: 10.9 g/dL — ABNORMAL LOW (ref 12.0–15.0)
MCH: 24.7 pg — ABNORMAL LOW (ref 26.0–34.0)
MCHC: 31 g/dL (ref 30.0–36.0)
MCV: 79.6 fL — ABNORMAL LOW (ref 80.0–100.0)
Platelets: 502 10*3/uL — ABNORMAL HIGH (ref 150–400)
RBC: 4.42 MIL/uL (ref 3.87–5.11)
RDW: 14.2 % (ref 11.5–15.5)
WBC: 6.8 10*3/uL (ref 4.0–10.5)
nRBC: 0 % (ref 0.0–0.2)

## 2019-08-27 LAB — COMPREHENSIVE METABOLIC PANEL
ALT: 20 U/L (ref 0–44)
AST: 19 U/L (ref 15–41)
Albumin: 3.7 g/dL (ref 3.5–5.0)
Alkaline Phosphatase: 77 U/L (ref 38–126)
Anion gap: 10 (ref 5–15)
BUN: 24 mg/dL — ABNORMAL HIGH (ref 6–20)
CO2: 22 mmol/L (ref 22–32)
Calcium: 9.2 mg/dL (ref 8.9–10.3)
Chloride: 100 mmol/L (ref 98–111)
Creatinine, Ser: 1.03 mg/dL — ABNORMAL HIGH (ref 0.44–1.00)
GFR calc Af Amer: >60 mL/min (ref >60)
GFR calc non Af Amer: >60 mL/min (ref >60)
Glucose, Bld: 220 mg/dL — ABNORMAL HIGH (ref 70–99)
Potassium: 4.6 mmol/L (ref 3.5–5.1)
Sodium: 132 mmol/L — ABNORMAL LOW (ref 135–145)
Total Bilirubin: 0.4 mg/dL (ref 0.3–1.2)
Total Protein: 8.2 g/dL — ABNORMAL HIGH (ref 6.5–8.1)

## 2019-08-27 LAB — URINALYSIS, MICROSCOPIC (REFLEX): RBC / HPF: >50 RBC/hpf (ref 0–5)

## 2019-08-27 LAB — URINALYSIS, ROUTINE W REFLEX MICROSCOPIC
Bilirubin Urine: NEGATIVE
Glucose, UA: 100 mg/dL — AB
Ketones, ur: NEGATIVE mg/dL
Leukocytes,Ua: NEGATIVE
Nitrite: NEGATIVE
Protein, ur: >300 mg/dL — AB
Specific Gravity, Urine: >1.03 — ABNORMAL HIGH (ref 1.005–1.030)
pH: 6 (ref 5.0–8.0)

## 2019-08-27 LAB — LACTIC ACID, PLASMA
Lactic Acid, Venous: 1.2 mmol/L (ref 0.5–1.9)
Lactic Acid, Venous: 1.3 mmol/L (ref 0.5–1.9)

## 2019-08-27 LAB — PROTIME-INR
INR: 1 (ref 0.8–1.2)
Prothrombin Time: 12.9 seconds (ref 11.4–15.2)

## 2019-08-27 LAB — APTT: aPTT: 26 seconds (ref 24–36)

## 2019-08-27 LAB — LIPASE, BLOOD: Lipase: 31 U/L (ref 11–51)

## 2019-08-27 LAB — PREGNANCY, URINE: Preg Test, Ur: NEGATIVE

## 2019-08-27 MED ORDER — IOHEXOL 300 MG/ML  SOLN
100.0000 mL | Freq: Once | INTRAMUSCULAR | Status: AC | PRN
  Administered 2019-08-27: 100 mL via INTRAVENOUS

## 2019-08-27 MED ORDER — SODIUM CHLORIDE 0.9 % IV BOLUS
1000.0000 mL | Freq: Once | INTRAVENOUS | Status: AC
  Administered 2019-08-27: 1000 mL via INTRAVENOUS

## 2019-08-27 MED ORDER — SODIUM CHLORIDE 0.9% FLUSH
3.0000 mL | Freq: Once | INTRAVENOUS | Status: DC
  Filled 2019-08-27: qty 3

## 2019-08-27 MED ORDER — ONDANSETRON HCL 4 MG/2ML IJ SOLN
4.0000 mg | Freq: Once | INTRAMUSCULAR | Status: AC
  Administered 2019-08-27: 4 mg via INTRAVENOUS
  Filled 2019-08-27: qty 2

## 2019-08-27 MED ORDER — PIPERACILLIN-TAZOBACTAM 3.375 G IVPB 30 MIN
3.3750 g | Freq: Once | INTRAVENOUS | Status: AC
  Administered 2019-08-27: 3.375 g via INTRAVENOUS
  Filled 2019-08-27 (×2): qty 50

## 2019-08-27 MED ORDER — ONDANSETRON 4 MG PO TBDP
4.0000 mg | ORAL_TABLET | Freq: Once | ORAL | Status: AC | PRN
  Administered 2019-08-27: 4 mg via ORAL
  Filled 2019-08-27: qty 1

## 2019-08-27 NOTE — ED Notes (Signed)
Attempt at PIV unsuccessful x2.  Attempt to be made with u/s

## 2019-08-27 NOTE — ED Triage Notes (Signed)
Pt c/o infection in R foot. Pt was seen by urgent care and started on abx. Pt started vomiting this AM. Pt was seen at [facility] and [facility]. Pt vomiting in triage.

## 2019-08-27 NOTE — ED Provider Notes (Signed)
[facility] EMERGENCY DEPARTMENT Provider Note   CSN: [id number] Arrival date & time: 08/27/19  1803     History   Chief Complaint Chief Complaint  Patient presents with   Wound Infection    HPI Lindsay Love is a 38 y.o. 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  Patient was seen at urgent care on 9/29 complaining of infection to the bottom of the right foot that had not been healing x 4 weeks. At time of evaluation pt was tachycardic in the 120s.  Labwork was obtained and xray was done of the foot - it showed chronic head collapse of the 2nd and 3rd metatarsal heads with possible chronic osteomyelitis. It was recommended that pt go to the ED for further evaluation but she declined at that time. She was given a shot of Rocephin and started on Clindamycin outpatient. Pt was also advised to follow up with podiatrist the next day which she did not do due to not having a ride. She went to [facility] today but left prior to being seen and came to this ED instead. She reports being compliant with the clindamycin but states that since then she has had a new wound pop up on her 2nd toe on the right foot, causing concern. Denies fever, chills, drainage, or any other associated symptoms.   Pt also reports that last night she began vomiting - no suspicious food intake. Reports she has vomited approximately 6-7 times today. Denies abdominal pain, diarrhea, coffee ground emesis, hematochezia, melena, urinary sx, or any other associated symptoms.        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[place])    with vitreous hemorrhage and tractional retinal detachment, left eye   RLS (restless legs syndrome) 10/27/2017   Wears glasses     Patient Active Problem List   Diagnosis Date Noted   Sepsis secondary to UTI ([place]) 09/06/2018   AKI (acute kidney injury) ([place]) 09/06/2018   Pyelonephritis    UTI (urinary tract infection) 07/20/2018   H/O amputation of lesser toe, right ([place]) 11/07/2017   Right foot infection 10/27/2017   Cellulitis in diabetic foot ([place]) 10/27/2017   Hyperglycemia 10/27/2017   URI (upper respiratory infection) 10/27/2017   Hyponatremia 10/27/2017   Lactic acidosis 10/27/2017   GERD (gastroesophageal reflux disease) 10/27/2017   RLS (restless legs syndrome) 10/27/2017   Vaginal candidiasis 10/27/2017   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 ([place]) 10/22/2016   IBS (irritable bowel syndrome) 10/22/2016   Asthma 10/22/2016   Right leg pain 10/22/2016   Cellulitis and abscess of toe of right foot    Diabetic peripheral neuropathy ([place])    Failure of outpatient treatment     Past Surgical History:  Procedure Laterality Date   AMPUTATION Right 11/01/2017   Procedure: RIGHT FOOT 5TH RAY AMPUTATION;  Surgeon: Newt Minion, MD;  Location: [facility];  Service: Orthopedics;  Laterality: Right;   CESAREAN SECTION     CHOLECYSTECTOMY     CYST EXCISION  INJECTION OF SILICONE OIL Left 3/35/4562   Procedure: Injection Of Silicone Oil;  Surgeon: Bernarda Caffey, MD;  Location: [facility];  Service: Ophthalmology;  Laterality: Left;   MEMBRANE PEEL Left 07/23/2019   Procedure: Antoine Primas;  Surgeon: Bernarda Caffey, MD;  Location: [facility];  Service: Ophthalmology;  Laterality: Left;   PARS PLANA VITRECTOMY Left 07/23/2019   Procedure: Pars Plana Vitrectomy With 25 Gauge;  Surgeon: Bernarda Caffey, MD;  Location: [facility];  Service: Ophthalmology;  Laterality: Left;   PHOTOCOAGULATION WITH LASER Left  07/23/2019   Procedure: Photocoagulation With Laser;  Surgeon: Bernarda Caffey, MD;  Location: [facility];  Service: Ophthalmology;  Laterality: Left;   REPAIR OF COMPLEX TRACTION RETINAL DETACHMENT Left 07/23/2019   Procedure: REPAIR OF COMPLEX TRACTION RETINAL DETACHMENT;  Surgeon: Bernarda Caffey, MD;  Location: [facility];  Service: Ophthalmology;  Laterality: Left;   TONSILLECTOMY     TOOTH EXTRACTION       OB History   No obstetric history on file.      Home Medications    Prior to Admission medications   Medication Sig Start Date End Date Taking? Authorizing Provider  clindamycin (CLEOCIN) 300 MG capsule Take by mouth. 08/25/19 08/30/19 Yes [provider]  ACCU-CHEK FASTCLIX LANCETS MISC USE TO TEST BLOOD SUGAR 5 TIMES DAILY 12/22/18   Law, Bea Graff, PA-C  albuterol (PROVENTIL HFA;VENTOLIN HFA) 108 (90 Base) MCG/ACT inhaler Inhale 2 puffs into the lungs every 6 (six) hours as needed for wheezing or shortness of breath.    [provider]  bacitracin-polymyxin b (POLYSPORIN) ophthalmic ointment Place into the left eye at bedtime as needed for up to 10 days. Place a 1/2 inch ribbon of ointment into the lower eyelid. 08/17/19 08/27/19  Bernarda Caffey, MD  blood glucose meter kit and supplies Dispense based on patient and insurance preference. Use up to four times daily as directed. (FOR ICD-9 250.00, 250.01). 10/25/16   Patrecia Pour, Christean Grief, MD  Blood Glucose Monitoring Suppl (GLUCOCOM BLOOD GLUCOSE MONITOR) DEVI Check blood sugar as directed 02/25/19   [provider]  fluticasone (FLONASE) 50 MCG/ACT nasal spray Place 1 spray into both nostrils daily as needed for allergies.  02/11/19   [provider]  gabapentin (NEURONTIN) 600 MG tablet Take 1,200 mg by mouth 2 (two) times daily.    [provider]  glucose blood (ACCU-CHEK GUIDE) test strip Use as instructed 12/22/18   Law, Bea Graff, PA-C  insulin aspart (NOVOLOG) 100 UNIT/ML injection Inject 35 Units  into the skin 3 (three) times daily with meals. Patient taking differently: Inject 25-50 Units into the skin See admin instructions. 3-4 times daily.  "Sliding scale plus 20 units." 11/02/17   Rai, Ripudeep K, MD  Insulin Degludec (TRESIBA FLEXTOUCH) 200 UNIT/ML SOPN Inject 100 Units into the skin every morning.    [provider]  Insulin Pen Needle (FIFTY50 PEN NEEDLES) 31G X 5 MM MISC USE ONE NEEDLE WITH EACH INSULIN INJECTION 6 TIMES PER DAY. E10.65 02/25/19   [provider]  linaclotide (LINZESS) 145 MCG CAPS capsule Take 145 mcg by mouth daily as needed (irritable bowel syndrome).    [provider]  lisinopril (ZESTRIL) 30 MG tablet Take 30 mg by mouth daily. 06/02/19   [provider]  loratadine (CLARITIN) 10 MG tablet Take 1 tablet (10 mg total) by mouth daily. 11/02/17   Rai, Vernelle Emerald, MD  metFORMIN (GLUCOPHAGE) 1000 MG tablet Take 1,000 mg by mouth 2 (two) times daily with  a meal.    [provider]  pantoprazole (PROTONIX) 40 MG tablet Take 1 tablet (40 mg total) by mouth daily at 6 (six) AM. 09/10/18   Hosie Poisson, MD  prednisoLONE acetate (PRED FORTE) 1 % ophthalmic suspension Place 1 drop into the left eye 4 (four) times daily. 07/31/19   Bernarda Caffey, MD  tiZANidine (ZANAFLEX) 4 MG tablet Take 4 mg by mouth every 8 (eight) hours as needed for muscle spasms.  05/08/19   [provider]    Family History Family History  Problem Relation Age of Onset   Diabetes Mother    Cancer Mother    Irritable bowel syndrome Mother    Hypertension Mother    Sleep apnea Mother    Diabetes Father    Restless legs syndrome Father     Social History Social History   Tobacco Use   Smoking status: Never Smoker   Smokeless tobacco: Never Used  Substance Use Topics   Alcohol use: No   Drug use: No     Allergies   Morphine and related   Review of Systems Review of Systems  Constitutional: Negative for chills and fever.    HENT: Negative for congestion.   Eyes: Negative for visual disturbance.  Respiratory: Negative for cough and shortness of breath.   Cardiovascular: Negative for chest pain.  Gastrointestinal: Positive for nausea and vomiting. Negative for abdominal pain, constipation and diarrhea.  Genitourinary: Negative for difficulty urinating.  Musculoskeletal: Negative for myalgias.  Skin: Positive for wound.  Neurological: Negative for headaches.     Physical Exam Updated Vital Signs BP (!) 158/88 (BP Location: Right Arm)    Pulse (!) 109    Temp 98.8 F (37.1 C) (Oral)    Resp 20    Ht _0  (1.702 m)    Wt 115.7 kg    SpO2 100%    BMI 39.94 kg/m   Physical Exam Vitals signs and nursing note reviewed.  Constitutional:      Appearance: She is obese. She is not ill-appearing.  HENT:     Head: Normocephalic and atraumatic.  Eyes:     Conjunctiva/sclera: Conjunctivae normal.  Neck:     Musculoskeletal: Neck supple.  Cardiovascular:     Rate and Rhythm: Regular rhythm. Tachycardia present.  Pulmonary:     Effort: Pulmonary effort is normal.     Breath sounds: Normal breath sounds. No wheezing, rhonchi or rales.  Abdominal:     Palpations: Abdomen is soft.     Tenderness: There is abdominal tenderness. There is right CVA tenderness. There is no left CVA tenderness, guarding or rebound.     Comments: Soft, mild tenderness difussely with + right CVA tenderness. +BS throughout, no r/g/r, neg murphy's, neg mcburney's  Musculoskeletal:     Comments: See photos below.  Skin:    General: Skin is warm and dry.  Neurological:     Mental Status: She is alert.            ED Treatments / Results  Labs (all labs ordered are listed, but only abnormal results are displayed) Labs Reviewed  COMPREHENSIVE METABOLIC PANEL - Abnormal; Notable for the following components:      Result Value   Sodium 132 (*)    Glucose, Bld 220 (*)    BUN 24 (*)    Creatinine, Ser 1.03 (*)    Total Protein  8.2 (*)    All other components within normal limits  CBC - Abnormal; Notable  for the following components:   Hemoglobin 10.9 (*)    HCT 35.2 (*)    MCV 79.6 (*)    MCH 24.7 (*)    Platelets 502 (*)    All other components within normal limits  URINALYSIS, ROUTINE W REFLEX MICROSCOPIC - Abnormal; Notable for the following components:   APPearance HAZY (*)    Specific Gravity, Urine >1.030 (*)    Glucose, UA 100 (*)    Hgb urine dipstick LARGE (*)    Protein, ur >300 (*)    All other components within normal limits  URINALYSIS, MICROSCOPIC (REFLEX) - Abnormal; Notable for the following components:   Bacteria, UA MANY (*)    All other components within normal limits  CULTURE, BLOOD (ROUTINE X 2)  CULTURE, BLOOD (ROUTINE X 2)  URINE CULTURE  SARS CORONAVIRUS 2 (TAT 6-24 HRS)  LIPASE, BLOOD  PREGNANCY, URINE  LACTIC ACID, PLASMA  LACTIC ACID, PLASMA  APTT  PROTIME-INR    EKG None  Radiology Ct Abdomen Pelvis W Contrast  Result Date: 08/27/2019 CLINICAL DATA:  38 year old female with abdominal pain. EXAM: CT ABDOMEN AND PELVIS WITH CONTRAST TECHNIQUE: Multidetector CT imaging of the abdomen and pelvis was performed using the standard protocol following bolus administration of intravenous contrast. CONTRAST:  158m OMNIPAQUE IOHEXOL 300 MG/ML  SOLN COMPARISON:  CT of the abdomen pelvis dated 09/06/2018 FINDINGS: Lower chest: The visualized lung bases are clear. No intra-abdominal free air or free fluid. Hepatobiliary: Apparent diffuse fatty infiltration of the liver. No intrahepatic biliary ductal dilatation. Cholecystectomy. Pancreas: Unremarkable. No pancreatic ductal dilatation or surrounding inflammatory changes. Spleen: Normal in size without focal abnormality. Adrenals/Urinary Tract: Adrenal glands are unremarkable. Kidneys are normal, without renal calculi, focal lesion, or hydronephrosis. Bladder is unremarkable. Stomach/Bowel: There is no bowel obstruction or active  inflammation. The appendix is normal. Vascular/Lymphatic: The abdominal aorta and IVC are unremarkable. No portal venous gas. Multiple top-normal retroperitoneal lymph nodes, likely reactive. Reproductive: The uterus is anteverted, enlarged, and myomatous. There is a 6 cm left ovarian cyst. The right ovary is unremarkable. Ultrasound may provide better evaluation of pelvic structures. Other: None Musculoskeletal: No acute or significant osseous findings. IMPRESSION: 1. A 6 cm left ovarian cyst. Ultrasound may provide better evaluation of pelvic structures. 2. Fatty liver. 3. No bowel obstruction or active inflammation. Normal appendix. Electronically Signed   By: AAnner CreteM.D.   On: 08/27/2019 22:25    Procedures Procedures (including critical care time)  Medications Ordered in ED Medications  sodium chloride flush (NS) 0.9 % injection 3 mL (3 mLs Intravenous Not Given 08/27/19 1928)  ondansetron (ZOFRAN-ODT) disintegrating tablet 4 mg (4 mg Oral Given 08/27/19 1833)  piperacillin-tazobactam (ZOSYN) IVPB 3.375 g (0 g Intravenous Stopped 08/27/19 2020)  ondansetron (ZOFRAN) injection 4 mg (4 mg Intravenous Given 08/27/19 2003)  iohexol (OMNIPAQUE) 300 MG/ML solution 100 mL (100 mLs Intravenous Contrast Given 08/27/19 2201)  sodium chloride 0.9 % bolus 1,000 mL (1,000 mLs Intravenous New Bag/Given 08/27/19 2220)  ondansetron (ZOFRAN) injection 4 mg (4 mg Intravenous Given 08/27/19 2235)     Initial Impression / Assessment and Plan / ED Course  I have reviewed the triage vital signs and the nursing notes.  Pertinent labs & imaging results that were available during my care of the patient were reviewed by me and considered in my medical decision making (see chart for details).    38year old female with history of diabetes who presents with wound infection to the right foot that she  represents has been there for the past 4 weeks.  Was seen at an urgent care a couple of days ago with x-ray which  showed questionable concern for osteo-.  Patient declined ED evaluation at the time.  Was discharged home with antibiotics.  Went to [facility] today and left prior to being seen he came to this ED instead.  She did have an x-ray done at [facility] with findings below.   IMPRESSION: No change from 2 days prior. Prior amputation at the mid fifth metatarsal level. 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 Lucency in the distal third metatarsal which is unchanged from 2 days prior but has progressed since the study from June 2020. There may well be avascular necrosis in this area. A degree of superimposed osteomyelitis in this area must be of potential concern given the progression of lucency in this region.  No acute fracture or dislocation. No appreciable arthropathy beyond the changes in the second and third MTP joints.  With respect to potential osteomyelitis in the third distal metatarsal, nuclear medicine three-phase bone scan or foot MR could be helpful for further assessment.   There is not currently any MRI access at this facility.  Does appear that patient does not take very good care for herself in regards to her diabetes.  She reports that her sugars typically around 250-300s.  Denies missing any insulin recently.  Reports new wound since being seen a couple days ago.  Concern for worsening infection feel that patient will benefit from admission at this time.  Will obtain sepsis work-up at this time start on Zosyn with concern for diabetic infection.  Also complains of nausea and vomiting that began yesterday.  She initially denied any abdominal pain but is diffusely tender on exam.  She does also have some right CVA tenderness.  She is currently on her period.  Will obtain CT abdomen and pelvis.  Lab work reassuring today.  There is no leukocytosis although patient is currently on clindamycin.  Sodium  mildly low at 132 but patient is already received IV fluids.  Creatinine mildly elevated at 1.03 although compared to previous improved.  Glucose 220.  No bicarb decrease and normal anion gap. No concern for DKA. No increase in LFTs.  Hemoglobin stable.  Lipase negative.  Lactic acid within normal limits.  Urinalysis does show large hemoglobin although patient is currently on her period.  I have reviewed past medical history and it appears that approximately 1 year ago patient had [place] with similar looking urinalysis.   CT scan unremarkable. Pt does have hx of gastroparesis - likely cause of her vomiting today. Will consult hospitalist at this time for admission for wound infection with concern for osteo in right foot.   Discussed case with hospitalist Dr. Blaine Hamper who agrees to accept patient for admission at this time. She will be transferred to [facility].   This note was prepared using Dragon voice recognition software and may include unintentional dictation errors due to the inherent limitations of voice recognition software.     Final Clinical Impressions(s) / ED Diagnoses   Final diagnoses:  Wound infection  Osteomyelitis of right foot, unspecified type [facility])  Gastroparesis    ED Discharge Orders    None       Eustaquio Maize, PA-C 08/27/19 2311    Malvin Johns, MD 08/27/19 2320

## 2019-08-27 NOTE — ED Notes (Signed)
Patient transported to CT 

## 2019-08-27 NOTE — ED Notes (Addendum)
Attempted phleb stick x1 for blood cultures without success. Pt made aware of need for urine sample.

## 2019-08-27 NOTE — Care Management (Signed)
This is a no charge note   Transfer from [facility] per PA, Alroy Bailiff  38 year old lady with a past medical history of hypertension, diabetes mellitus, gastroparesis, asthma, GERD, L and S, irritable bowel syndrome, who presents with right foot infection.  Pt has two wounds in right foot, one in bottom of the right foot that had not been healing x 4 weeks, and another new wound in 2nd toe on the right foot.  Patient was seen at urgent care on 9/29. She had xray done of the foot,which showed chronic head collapse of the 2nd and 3rd metatarsal heads with possible chronic osteomyelitis. She was given a shot of Rocephin and started on Clindamycin outpatient. Pt was also advised to follow up with podiatrist the next day which she did not do due to not having a ride. She went to [facility] today but left prior to being seen and came to this ED instead. No fever or chill. Patient also has nausea vomiting and diffused abdominal tenderness since last night. Reports she has vomited approximately 6-7 times today.  Patient was found to have WBC 6.8, lactic acid 1.2, 1.3, INR 1.0, negative pregnancy test, lipase 31, urinalysis not impressive, pending COVID-19 test, creatinine 1.03, BUN 24, temperature normal, blood pressure 136/88, tachycardia, oxygen saturation 100% on room air.  CT abdomen/pelvis is negative for acute intracranial abnormalities, but showed 6 cm of left ovarian cyst. Pt is admit to to med-surg bed as inpt. She will need MRI of right foot to r/o osteomyelitis.  Please call manager of [facility] at [phone] when pt arrives to floor   Ivor Costa, MD  [facility]   If 7PM-7AM, please contact night-coverage [email or web address] Password [email or web address] 08/27/2019, 11:46 PM

## 2019-08-28 ENCOUNTER — Inpatient Hospital Stay (HOSPITAL_COMMUNITY): Payer: Medicaid Other

## 2019-08-28 DIAGNOSIS — L0390 Cellulitis, unspecified: Secondary | ICD-10-CM

## 2019-08-28 DIAGNOSIS — L089 Local infection of the skin and subcutaneous tissue, unspecified: Secondary | ICD-10-CM

## 2019-08-28 DIAGNOSIS — K219 Gastro-esophageal reflux disease without esophagitis: Secondary | ICD-10-CM

## 2019-08-28 DIAGNOSIS — N83209 Unspecified ovarian cyst, unspecified side: Secondary | ICD-10-CM | POA: Diagnosis present

## 2019-08-28 DIAGNOSIS — K3184 Gastroparesis: Secondary | ICD-10-CM

## 2019-08-28 DIAGNOSIS — N183 Chronic kidney disease, stage 3 unspecified: Secondary | ICD-10-CM | POA: Diagnosis present

## 2019-08-28 DIAGNOSIS — R109 Unspecified abdominal pain: Secondary | ICD-10-CM | POA: Diagnosis present

## 2019-08-28 DIAGNOSIS — R1084 Generalized abdominal pain: Secondary | ICD-10-CM

## 2019-08-28 DIAGNOSIS — I10 Essential (primary) hypertension: Secondary | ICD-10-CM | POA: Diagnosis present

## 2019-08-28 LAB — BASIC METABOLIC PANEL
Anion gap: 10 (ref 5–15)
BUN: 21 mg/dL — ABNORMAL HIGH (ref 6–20)
CO2: 19 mmol/L — ABNORMAL LOW (ref 22–32)
Calcium: 8.9 mg/dL (ref 8.9–10.3)
Chloride: 105 mmol/L (ref 98–111)
Creatinine, Ser: 0.96 mg/dL (ref 0.44–1.00)
GFR calc Af Amer: >60 mL/min (ref >60)
GFR calc non Af Amer: >60 mL/min (ref >60)
Glucose, Bld: 229 mg/dL — ABNORMAL HIGH (ref 70–99)
Potassium: 4.3 mmol/L (ref 3.5–5.1)
Sodium: 134 mmol/L — ABNORMAL LOW (ref 135–145)

## 2019-08-28 LAB — CBC
HCT: 34.6 % — ABNORMAL LOW (ref 36.0–46.0)
Hemoglobin: 10.5 g/dL — ABNORMAL LOW (ref 12.0–15.0)
MCH: 24.8 pg — ABNORMAL LOW (ref 26.0–34.0)
MCHC: 30.3 g/dL (ref 30.0–36.0)
MCV: 81.6 fL (ref 80.0–100.0)
Platelets: 419 10*3/uL — ABNORMAL HIGH (ref 150–400)
RBC: 4.24 MIL/uL (ref 3.87–5.11)
RDW: 14.2 % (ref 11.5–15.5)
WBC: 6.2 10*3/uL (ref 4.0–10.5)
nRBC: 0 % (ref 0.0–0.2)

## 2019-08-28 LAB — HEMOGLOBIN A1C
Hgb A1c MFr Bld: 8 % — ABNORMAL HIGH (ref 4.8–5.6)
Mean Plasma Glucose: 182.9 mg/dL

## 2019-08-28 LAB — URINE CULTURE: Culture: NO GROWTH

## 2019-08-28 LAB — SEDIMENTATION RATE: Sed Rate: 40 mm/hr — ABNORMAL HIGH (ref 0–22)

## 2019-08-28 LAB — SARS CORONAVIRUS 2 (TAT 6-24 HRS): SARS Coronavirus 2: NEGATIVE

## 2019-08-28 LAB — HIV ANTIBODY (ROUTINE TESTING W REFLEX): HIV Screen 4th Generation wRfx: NONREACTIVE

## 2019-08-28 LAB — GLUCOSE, CAPILLARY
Glucose-Capillary: 198 mg/dL — ABNORMAL HIGH (ref 70–99)
Glucose-Capillary: 204 mg/dL — ABNORMAL HIGH (ref 70–99)
Glucose-Capillary: 308 mg/dL — ABNORMAL HIGH (ref 70–99)

## 2019-08-28 LAB — C-REACTIVE PROTEIN: CRP: <0.8 mg/dL (ref <1.0)

## 2019-08-28 MED ORDER — DIPHENHYDRAMINE HCL 25 MG PO CAPS
ORAL_CAPSULE | ORAL | Status: AC
  Filled 2019-08-28: qty 1

## 2019-08-28 MED ORDER — PANTOPRAZOLE SODIUM 40 MG PO TBEC
40.0000 mg | DELAYED_RELEASE_TABLET | Freq: Every day | ORAL | Status: DC
  Administered 2019-08-28 – 2019-08-31 (×4): 40 mg via ORAL
  Filled 2019-08-28 (×4): qty 1

## 2019-08-28 MED ORDER — ACETAMINOPHEN 325 MG PO TABS: 650.0000 mg | ORAL_TABLET | Freq: Four times a day (QID) | ORAL | Status: DC | PRN

## 2019-08-28 MED ORDER — SODIUM CHLORIDE 0.9 % IV SOLN
2.0000 g | INTRAVENOUS | Status: DC
  Administered 2019-08-28: 2 g via INTRAVENOUS
  Filled 2019-08-28: qty 20
  Filled 2019-08-28: qty 2

## 2019-08-28 MED ORDER — LINACLOTIDE 145 MCG PO CAPS
145.0000 ug | ORAL_CAPSULE | Freq: Every day | ORAL | Status: DC | PRN
  Filled 2019-08-28: qty 1

## 2019-08-28 MED ORDER — HEPARIN SODIUM (PORCINE) 5000 UNIT/ML IJ SOLN
5000.0000 [IU] | Freq: Three times a day (TID) | INTRAMUSCULAR | Status: DC
  Administered 2019-08-28 – 2019-08-31 (×9): 5000 [IU] via SUBCUTANEOUS
  Filled 2019-08-28 (×9): qty 1

## 2019-08-28 MED ORDER — DIPHENHYDRAMINE HCL 25 MG PO CAPS
25.0000 mg | ORAL_CAPSULE | Freq: Once | ORAL | Status: AC
  Administered 2019-08-28: 25 mg via ORAL

## 2019-08-28 MED ORDER — ALBUTEROL SULFATE HFA 108 (90 BASE) MCG/ACT IN AERS: 2.0000 | INHALATION_SPRAY | Freq: Four times a day (QID) | RESPIRATORY_TRACT | Status: DC | PRN

## 2019-08-28 MED ORDER — INSULIN ASPART 100 UNIT/ML ~~LOC~~ SOLN
0.0000 [IU] | Freq: Three times a day (TID) | SUBCUTANEOUS | Status: DC
  Administered 2019-08-28: 3 [IU] via SUBCUTANEOUS
  Administered 2019-08-28: 2 [IU] via SUBCUTANEOUS

## 2019-08-28 MED ORDER — ACETAMINOPHEN 650 MG RE SUPP: 650.0000 mg | Freq: Four times a day (QID) | RECTAL | Status: DC | PRN

## 2019-08-28 MED ORDER — LISINOPRIL 20 MG PO TABS
30.0000 mg | ORAL_TABLET | Freq: Every day | ORAL | Status: DC
  Administered 2019-08-28: 30 mg via ORAL
  Filled 2019-08-28: qty 1

## 2019-08-28 MED ORDER — HYDRALAZINE HCL 25 MG PO TABS
25.0000 mg | ORAL_TABLET | Freq: Three times a day (TID) | ORAL | Status: DC | PRN
  Administered 2019-08-29 – 2019-08-30 (×4): 25 mg via ORAL
  Filled 2019-08-28 (×5): qty 1

## 2019-08-28 MED ORDER — FLUTICASONE PROPIONATE 50 MCG/ACT NA SUSP: 1.0000 | Freq: Every day | NASAL | Status: DC | PRN

## 2019-08-28 MED ORDER — CLONIDINE HCL 0.1 MG PO TABS
0.1000 mg | ORAL_TABLET | Freq: Every day | ORAL | Status: DC
  Administered 2019-08-28: 0.1 mg via ORAL
  Filled 2019-08-28: qty 1

## 2019-08-28 MED ORDER — VANCOMYCIN HCL IN DEXTROSE 1-5 GM/200ML-% IV SOLN
1000.0000 mg | Freq: Two times a day (BID) | INTRAVENOUS | Status: DC
  Administered 2019-08-28: 1000 mg via INTRAVENOUS
  Filled 2019-08-28: qty 200

## 2019-08-28 MED ORDER — VANCOMYCIN HCL 10 G IV SOLR
2000.0000 mg | Freq: Once | INTRAVENOUS | Status: AC
  Administered 2019-08-28: 04:00:00 2000 mg via INTRAVENOUS
  Filled 2019-08-28: qty 2000

## 2019-08-28 MED ORDER — HYDROMORPHONE HCL 1 MG/ML IJ SOLN: 1.0000 mg | INTRAMUSCULAR | Status: DC | PRN

## 2019-08-28 MED ORDER — GABAPENTIN 400 MG PO CAPS
1200.0000 mg | ORAL_CAPSULE | Freq: Two times a day (BID) | ORAL | Status: DC
  Administered 2019-08-28 – 2019-08-31 (×7): 1200 mg via ORAL
  Filled 2019-08-28 (×7): qty 3

## 2019-08-28 MED ORDER — SENNOSIDES-DOCUSATE SODIUM 8.6-50 MG PO TABS: 1.0000 | ORAL_TABLET | Freq: Every evening | ORAL | Status: DC | PRN

## 2019-08-28 MED ORDER — HYDRALAZINE HCL 20 MG/ML IJ SOLN
5.0000 mg | Freq: Once | INTRAMUSCULAR | Status: AC
  Administered 2019-08-28: 5 mg via INTRAVENOUS
  Filled 2019-08-28: qty 1

## 2019-08-28 MED ORDER — INSULIN ASPART 100 UNIT/ML ~~LOC~~ SOLN
5.0000 [IU] | Freq: Once | SUBCUTANEOUS | Status: AC
  Administered 2019-08-28: 5 [IU] via SUBCUTANEOUS

## 2019-08-28 MED ORDER — INSULIN GLARGINE 100 UNIT/ML ~~LOC~~ SOLN
30.0000 [IU] | Freq: Every morning | SUBCUTANEOUS | Status: DC
  Administered 2019-08-28: 30 [IU] via SUBCUTANEOUS
  Filled 2019-08-28 (×3): qty 0.3

## 2019-08-28 MED ORDER — METOCLOPRAMIDE HCL 5 MG/ML IJ SOLN
5.0000 mg | Freq: Three times a day (TID) | INTRAMUSCULAR | Status: DC
  Administered 2019-08-28 – 2019-08-31 (×9): 5 mg via INTRAVENOUS
  Filled 2019-08-28 (×9): qty 2

## 2019-08-28 MED ORDER — ONDANSETRON 4 MG PO TBDP: 4.0000 mg | ORAL_TABLET | Freq: Three times a day (TID) | ORAL | Status: DC | PRN

## 2019-08-28 NOTE — Progress Notes (Signed)
[facility] PLAN OF CARE NOTE Patient: Lindsay Love CMK:[id number]   PCP: Robert Bellow, PA-C DOB: 08/17/1981   DOA: 08/27/2019   DOS: 08/28/2019    Patient was admitted by my colleague Dr. Blaine Hamper earlier on 08/28/2019. I have reviewed the H&P as well as assessment and plan and agree with the same. Important changes in the plan are listed below.  Plan of care: Principal Problem:   Right foot infection Active Problems:   Asthma   GERD (gastroesophageal reflux disease)   CKD (chronic kidney disease), stage III   HTN (hypertension)   Abdominal pain   Gastroparesis   Ovarian cyst Mri positive for osteomyelitis  Will consult orthocare.  Author: Berle Mull, MD [facility] 08/28/2019 5:21 PM   If 7PM-7AM, please contact night-coverage at [email or web address]

## 2019-08-28 NOTE — ED Notes (Addendum)
MD Molpus made aware of patient's blood pressure 189/111. No new orders regarding BP. Pt also complaining of itching. EDP made aware, see new orders.

## 2019-08-28 NOTE — Progress Notes (Signed)
Pt's B/P 204/93,190/108 & 185/107. On-call Hayden Pedro NP notified . Ordered IV Hydralazine 5mg  IV push. Administered Hydralazine per order. Will rechecked V/S and will continue to monitor.

## 2019-08-28 NOTE — Progress Notes (Signed)
Pharmacy Antibiotic Note  Lindsay Love is a 38 y.o. female admitted on 08/27/2019 with foot infection.  Pharmacy has been consulted for vancomycin dosing.  Plan: Zosyn 3.375 Gm ED  Vancomycin 2 Gm x1 then 1 Gm IV q12h for est AUC = 538 Use scr = 1.03, Vd = 0.5 Goal AUC = 400-550 F/u scr/cultures/levels  Height: 5\' 7"  (170.2 cm) Weight: 255 lb (115.7 kg) IBW/kg (Calculated) : 61.6  Temp (24hrs), Avg:98.5 F (36.9 C), Min:98.1 F (36.7 C), Max:98.8 F (37.1 C)  Recent Labs  Lab 08/27/19 1922 08/27/19 1930 08/27/19 2100  WBC 6.8  --   --   CREATININE 1.03*  --   --   LATICACIDVEN  --  1.2 1.3    Estimated Creatinine Clearance: 97.3 mL/min (A) (by C-G formula based on SCr of 1.03 mg/dL (H)).    Allergies  Allergen Reactions  . Morphine And Related Nausea And Vomiting    Antimicrobials this admission: 10/2 zosyn >>  10/2 vancomycin >>   Dose adjustments this admission:   Microbiology results:  BCx:   UCx:    Sputum:    MRSA PCR:   Thank you for allowing pharmacy to be a part of this patient's care.  Dorrene German 08/28/2019 3:36 AM

## 2019-08-28 NOTE — H&P (Addendum)
History and Physical    Lindsay Love KHT:[id number] DOB: Aug 08, 1981 DOA: 08/27/2019  Referring MD/NP/PA:   PCP: Robert Bellow, PA-C   Patient coming from:  The patient is coming from home.  At baseline, pt is independent for most of ADL.        Chief Complaint: right foot infection  HPI: Lindsay Love is a 38 y.o. 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  Pt has two lesions in right foot, one in bottom of the right foot that has been going on for about 4 weeks, and another new lesion is in 2nd toe on the right foot. No foot pain. Patient was seen at urgent care on 9/29. She had X-ray done of the right foot, which showed chronic head collapse of the 2nd and 3rd metatarsal heads with possible chronic osteomyelitis. She was given shot of Rocephin and started on Clindamycin outpatient. Pt was also advised to follow up with podiatrist the next day which she did not do due to not having a ride. She went to [facility] today but left prior to being seen and came to this ED instead. No fever or chill. Patient also has nausea, vomiting and diffused abdominal tenderness since last night. Reports she has vomited approximately 6-7 times today.  Patient denies chest pain, cough, shortness breath.  No symptoms of UTI or unilateral weakness.  ED Course: pt was found to have have WBC 6.8, lactic acid 1.2, 1.3, INR 1.0, negative pregnancy test, lipase 31, urinalysis not impressive, pending COVID-19 test, creatinine 1.03, BUN 24, temperature normal, blood pressure 136/88, tachycardia, oxygen saturation 100% on room air.  CT abdomen/pelvis is negative for acute intracranial abnormalities, but showed 6 cm of left ovarian cyst. Pt is admit to to med-surg bed as inpt. She will need MRI of right foot to r/o osteomyelitis.   Review of Systems:   General: no fevers, chills, no body weight gain, has fatigue HEENT: no blurry vision, hearing changes or sore throat Respiratory: no dyspnea, coughing, wheezing CV: no chest pain, no palpitations GI: has nausea, vomiting, abdominal pain, no diarrhea, constipation GU: no dysuria, burning on urination, increased urinary frequency, hematuria  Ext: no leg edema Neuro: no unilateral weakness, numbness, or tingling, no vision change or hearing loss Skin: has right foot lesions MSK: No muscle spasm, no deformity, no limitation of range of movement in spin Heme: No easy bruising.  Travel history: No recent long distant travel.  Allergy:  Allergies  Allergen Reactions  . Morphine And Related Nausea And Vomiting    Past Medical History:  Diagnosis Date  . Asthma   . Cellulitis of left foot   . Diabetes mellitus without complication ([place])   . Diabetic retinopathy ([place])    PDR OU  . GERD (gastroesophageal reflux disease) 10/27/2017  . H/O seasonal allergies   . Hypertension   . Hypertensive retinopathy    OU  . IBS (irritable bowel syndrome)   . 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 RLS (restless legs syndrome) 10/27/2017  . Wears glasses     Past Surgical History:  Procedure Laterality Date  . AMPUTATION Right 11/01/2017   Procedure: RIGHT FOOT 5TH RAY AMPUTATION;  Surgeon: Newt Minion, MD;  Location: [facility];  Service: Orthopedics;  Laterality: Right;  .  CESAREAN SECTION    . CHOLECYSTECTOMY    . CYST EXCISION    . INJECTION OF SILICONE OIL Left 07/09/4817   Procedure: Injection Of Silicone Oil;  Surgeon: Bernarda Caffey, MD;  Location: [facility];  Service: Ophthalmology;  Laterality: Left;  . MEMBRANE PEEL Left 07/23/2019   Procedure: Antoine Primas;  Surgeon: Bernarda Caffey, MD;  Location: [facility];  Service: Ophthalmology;  Laterality: Left;  . PARS PLANA VITRECTOMY Left 07/23/2019    Procedure: [place] Vitrectomy With 25 Gauge;  Surgeon: Bernarda Caffey, MD;  Location: [facility];  Service: Ophthalmology;  Laterality: Left;  . PHOTOCOAGULATION WITH LASER Left 07/23/2019   Procedure: Photocoagulation With Laser;  Surgeon: Bernarda Caffey, MD;  Location: [facility];  Service: Ophthalmology;  Laterality: Left;  . REPAIR OF COMPLEX TRACTION RETINAL DETACHMENT Left 07/23/2019   Procedure: REPAIR OF COMPLEX TRACTION RETINAL DETACHMENT;  Surgeon: Bernarda Caffey, MD;  Location: [facility];  Service: Ophthalmology;  Laterality: Left;  . TONSILLECTOMY    . TOOTH EXTRACTION      Social History:  reports that she has never smoked. She has never used smokeless tobacco. She reports that she does not drink alcohol or use drugs.  Family History:  Family History  Problem Relation Age of Onset  . Diabetes Mother   . Cancer Mother   . Irritable bowel syndrome Mother   . Hypertension Mother   . Sleep apnea Mother   . Diabetes Father   . Restless legs syndrome Father      Prior to Admission medications   Medication Sig Start Date End Date Taking? Authorizing Provider  clindamycin (CLEOCIN) 300 MG capsule Take by mouth. 08/25/19 08/30/19 Yes [provider]  ACCU-CHEK FASTCLIX LANCETS MISC USE TO TEST BLOOD SUGAR 5 TIMES DAILY 12/22/18   Law, Bea Graff, PA-C  albuterol (PROVENTIL HFA;VENTOLIN HFA) 108 (90 Base) MCG/ACT inhaler Inhale 2 puffs into the lungs every 6 (six) hours as needed for wheezing or shortness of breath.    [provider]  blood glucose meter kit and supplies Dispense based on patient and insurance preference. Use up to four times daily as directed. (FOR ICD-9 250.00, 250.01). 10/25/16   Patrecia Pour, Christean Grief, MD  Blood Glucose Monitoring Suppl (GLUCOCOM BLOOD GLUCOSE MONITOR) DEVI Check blood sugar as directed 02/25/19   [provider]  fluticasone (FLONASE) 50 MCG/ACT nasal spray Place 1 spray into both nostrils daily as needed for allergies.  02/11/19   [provider]  gabapentin (NEURONTIN) 600 MG tablet Take 1,200 mg by mouth 2 (two) times daily.    [provider]  glucose blood (ACCU-CHEK GUIDE) test strip Use as instructed 12/22/18   Law, Bea Graff, PA-C  insulin aspart (NOVOLOG) 100 UNIT/ML injection Inject 35 Units into the skin 3 (three) times daily with meals. Patient taking differently: Inject 25-50 Units into the skin See admin instructions. 3-4 times daily.  "Sliding scale plus 20 units." 11/02/17   Rai, Ripudeep K, MD  Insulin Degludec (TRESIBA FLEXTOUCH) 200 UNIT/ML SOPN Inject 100 Units into the skin every morning.    [provider]  Insulin Pen Needle (FIFTY50 PEN NEEDLES) 31G X 5 MM MISC USE ONE NEEDLE WITH EACH INSULIN INJECTION 6 TIMES PER DAY. E10.65 02/25/19   [provider]  linaclotide (LINZESS) 145 MCG CAPS capsule Take 145 mcg by mouth daily as needed (irritable bowel syndrome).    [provider]  lisinopril (ZESTRIL) 30 MG tablet Take 30 mg by mouth daily. 06/02/19  [provider]  loratadine (CLARITIN) 10 MG tablet Take 1 tablet (10 mg total) by mouth daily. 11/02/17   Rai, Vernelle Emerald, MD  metFORMIN (GLUCOPHAGE) 1000 MG tablet Take 1,000 mg by mouth 2 (two) times daily with a meal.    [provider]  pantoprazole (PROTONIX) 40 MG tablet Take 1 tablet (40 mg total) by mouth daily at 6 (six) AM. 09/10/18   Hosie Poisson, MD  prednisoLONE acetate (PRED FORTE) 1 % ophthalmic suspension Place 1 drop into the left eye 4 (four) times daily. 07/31/19   Bernarda Caffey, MD  tiZANidine (ZANAFLEX) 4 MG tablet Take 4 mg by mouth every 8 (eight) hours as needed for muscle spasms.  05/08/19   [provider]    Physical Exam: Vitals:   08/28/19 0137 08/28/19 0226 08/28/19 0603 08/28/19 0620  BP: (!) 189/111 130/65 (!) 204/93 (!) 190/108  Pulse:  (!) 103 (!) 111 (!) 109  Resp:  '14 18 18  ' Temp:  98.6 F (37 C) 98.5 F (36.9 C) 98.5 F (36.9 C)  TempSrc:  Oral Oral Oral   SpO2:   98% 100%  Weight:      Height:       General: Not in acute distress HEENT:       Eyes: PERRL, EOMI, no scleral icterus.       ENT: No discharge from the ears and nose, no pharynx injection, no tonsillar enlargement.        Neck: No JVD, no bruit, no mass felt. Heme: No neck lymph node enlargement. Cardiac: S1/S2, RRR, No murmurs, No gallops or rubs. Respiratory:  No rales, wheezing, rhonchi or rubs. GI: Soft, nondistended, has tenderness in central abdomen, no rebound pain, no organomegaly, BS present. GU: No hematuria Ext: No pitting leg edema bilaterally. 1+DP/PT pulse bilaterally. Musculoskeletal: No joint deformities, No joint redness or warmth, no limitation of ROM in spin. Skin: has a lesion in the plantar area with mild erythema, no open wound, no tenderness. Has a small area of redness and blister in the second toe of right foot, no tenderness. Please see EDP's note for picture. Neuro: Alert, oriented X3, cranial nerves II-XII grossly intact, moves all extremities normally.  Psych: Patient is not psychotic, no suicidal or hemocidal ideation.  Labs on Admission: I have personally reviewed following labs and imaging studies  CBC: Recent Labs  Lab 08/27/19 1922 08/28/19 0329  WBC 6.8 6.2  HGB 10.9* 10.5*  HCT 35.2* 34.6*  MCV 79.6* 81.6  PLT 502* 563*   Basic Metabolic Panel: Recent Labs  Lab 08/27/19 1922 08/28/19 0329  NA 132* 134*  K 4.6 4.3  CL 100 105  CO2 22 19*  GLUCOSE 220* 229*  BUN 24* 21*  CREATININE 1.03* 0.96  CALCIUM 9.2 8.9   GFR: Estimated Creatinine Clearance: 104.4 mL/min (by C-G formula based on SCr of 0.96 mg/dL). Liver Function Tests: Recent Labs  Lab 08/27/19 1922  AST 19  ALT 20  ALKPHOS 77  BILITOT 0.4  PROT 8.2*  ALBUMIN 3.7   Recent Labs  Lab 08/27/19 1922  LIPASE 31   No results for input(s): AMMONIA in the last 168 hours. Coagulation Profile: Recent Labs  Lab 08/27/19 1930  INR 1.0   Cardiac Enzymes: No  results for input(s): CKTOTAL, CKMB, CKMBINDEX, TROPONINI in the last 168 hours. BNP (last 3 results) No results for input(s): PROBNP in the last 8760 hours. HbA1C: Recent Labs    08/28/19 0329  HGBA1C 8.0*  CBG: Recent Labs  Lab 08/28/19 0419  GLUCAP 198*   Lipid Profile: No results for input(s): CHOL, HDL, LDLCALC, TRIG, CHOLHDL, LDLDIRECT in the last 72 hours. Thyroid Function Tests: No results for input(s): TSH, T4TOTAL, FREET4, T3FREE, THYROIDAB in the last 72 hours. Anemia Panel: No results for input(s): VITAMINB12, FOLATE, FERRITIN, TIBC, IRON, RETICCTPCT in the last 72 hours. Urine analysis:    Component Value Date/Time   COLORURINE YELLOW 08/27/2019 2100   APPEARANCEUR HAZY (A) 08/27/2019 2100   LABSPEC >1.030 (H) 08/27/2019 2100   PHURINE 6.0 08/27/2019 2100   GLUCOSEU 100 (A) 08/27/2019 2100   HGBUR LARGE (A) 08/27/2019 2100   [place] NEGATIVE 08/27/2019 2100   KETONESUR NEGATIVE 08/27/2019 2100   PROTEINUR >300 (A) 08/27/2019 2100   UROBILINOGEN 0.2 07/11/2014 1555   NITRITE NEGATIVE 08/27/2019 2100   LEUKOCYTESUR NEGATIVE 08/27/2019 2100   Sepsis Labs: '@LABRCNTIP' (procalcitonin:4,lacticidven:4) ) Recent Results (from the past 240 hour(s))  Blood Culture (routine x 2)     Status: None (Preliminary result)   Collection Time: 08/27/19  7:23 PM   Specimen: BLOOD LEFT ARM  Result Value Ref Range Status   Specimen Description   Final    BLOOD LEFT ARM Performed at [facility], [place] [address], [place], [place] [address]    Special Requests   Final    BOTTLES DRAWN AEROBIC AND ANAEROBIC Blood Culture adequate volume Performed at [facility], [address] [place], [place] [address]    Culture PENDING  Incomplete   Report Status PENDING  Incomplete     Radiological Exams on Admission: Ct Abdomen Pelvis W Contrast  Result Date: 08/27/2019 CLINICAL DATA:  38 year old female with abdominal pain. EXAM: CT ABDOMEN AND PELVIS WITH  CONTRAST TECHNIQUE: Multidetector CT imaging of the abdomen and pelvis was performed using the standard protocol following bolus administration of intravenous contrast. CONTRAST:  180m OMNIPAQUE IOHEXOL 300 MG/ML  SOLN COMPARISON:  CT of the abdomen pelvis dated 09/06/2018 FINDINGS: Lower chest: The visualized lung bases are clear. No intra-abdominal free air or free fluid. Hepatobiliary: Apparent diffuse fatty infiltration of the liver. No intrahepatic biliary ductal dilatation. Cholecystectomy. Pancreas: Unremarkable. No pancreatic ductal dilatation or surrounding inflammatory changes. Spleen: Normal in size without focal abnormality. Adrenals/Urinary Tract: Adrenal glands are unremarkable. Kidneys are normal, without renal calculi, focal lesion, or hydronephrosis. Bladder is unremarkable. Stomach/Bowel: There is no bowel obstruction or active inflammation. The appendix is normal. Vascular/Lymphatic: The abdominal aorta and IVC are unremarkable. No portal venous gas. Multiple top-normal retroperitoneal lymph nodes, likely reactive. Reproductive: The uterus is anteverted, enlarged, and myomatous. There is a 6 cm left ovarian cyst. The right ovary is unremarkable. Ultrasound may provide better evaluation of pelvic structures. Other: None Musculoskeletal: No acute or significant osseous findings. IMPRESSION: 1. A 6 cm left ovarian cyst. Ultrasound may provide better evaluation of pelvic structures. 2. Fatty liver. 3. No bowel obstruction or active inflammation. Normal appendix. Electronically Signed   By: AAnner CreteM.D.   On: 08/27/2019 22:25     EKG: Independently reviewed.  Sinus rhythm, tachycardia, QTC 449, low voltage.   Assessment/Plan Principal Problem:   Right foot infection Active Problems:   Asthma   GERD (gastroesophageal reflux disease)   CKD (chronic kidney disease), stage III   HTN (hypertension)   Abdominal pain   Gastroparesis   Ovarian cyst   Right foot infection: has a  lesion in the plantar area with mild erythema, no open wound, no tenderness. Has  a small area of redness and blister in the second toe of right foot, no tenderness.  She had X-ray done of the right foot 9/29 at [facility], which showed chronic head collapse of the 2nd and 3rd metatarsal heads with possible chronic osteomyelitis.  No leukocytosis, no fever.  Clinically not septic.  Lactic acid normal.  Hemodynamically stable  -will admit to med-surg bed  -Vancomycin (patient received 1 dose of Zosyn in ED) - Follow-up blood culture - MRI of right foot to rule out osteomyelitis - esr and crp - get ABI  Asthma: stable -prn albuterol  GERD (gastroesophageal reflux disease): -Protonix  CKD (chronic kidney disease), stage III: stable. Cre 1.03 and BUN 24 -f/u BMP  HTN:  -Continue home medications: Lisinopril -oral  hydralazine prn  Abdominal pain and Gastroparesis: Her abdominal pain, nausea vomiting likely due to gastroparesis.  CT abdomen/pelvis is negative for acute intra-abdominal abnormalities. -PRN Zofran for nausea, scheduled Reglan - PRN Dilaudid for pain  Ovarian cyst: CT scan showed 6 cm of left ovarian cyst. -Follow-up with CBC with repeated ultrasound    Inpatient status:  # Patient requires inpatient status due to high intensity of service, high risk for further deterioration and high frequency of surveillance required.  I certify that at the point of admission it is my clinical judgment that the patient will require inpatient hospital care spanning beyond 2 midnights from the point of admission.  . 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 Now patient has presenting symptoms include right foot infection with possible osteomyelitis . The worrisome physical exam findings include to right foot lesions . The initial radiographic  and laboratory data are worrisome because of possible osteomyelitis shown by x-ray . Current medical needs: please see my assessment and plan . Predictability of an adverse outcome (risk): Patient has multiple comorbidities, now presents with right foot infection with possible osteomyelitis.  Will need further evaluation by MRI.  If confirmed osteomyelitis, patient may need surgery.  She is at high risk for deteriorating.  Will need to be treated in hospital for at least 2 days.          DVT ppx: SQ Heparin    Code Status: Full code Family Communication: None at bed side.   Disposition Plan:  Anticipate discharge back to previous home environment Consults called:  none Admission status:  medical floor/inpt  Date of Service 08/28/2019    Ivor Costa [facility]   If 7PM-7AM, please contact night-coverage [email or web address] Password [facility] 08/28/2019, 6:23 AM

## 2019-08-28 NOTE — Progress Notes (Signed)
ABI's have been completed. Preliminary results can be found in CV Proc through chart review.   08/28/19 8:41 AM Lindsay Love RVT

## 2019-08-29 ENCOUNTER — Inpatient Hospital Stay (HOSPITAL_COMMUNITY): Payer: Medicaid Other

## 2019-08-29 DIAGNOSIS — M6701 Short Achilles tendon (acquired), right ankle: Secondary | ICD-10-CM

## 2019-08-29 DIAGNOSIS — E1142 Type 2 diabetes mellitus with diabetic polyneuropathy: Secondary | ICD-10-CM

## 2019-08-29 LAB — BASIC METABOLIC PANEL
Anion gap: 10 (ref 5–15)
Anion gap: 9 (ref 5–15)
BUN: 35 mg/dL — ABNORMAL HIGH (ref 6–20)
BUN: 36 mg/dL — ABNORMAL HIGH (ref 6–20)
CO2: 21 mmol/L — ABNORMAL LOW (ref 22–32)
CO2: 20 mmol/L — ABNORMAL LOW (ref 22–32)
Calcium: 8.4 mg/dL — ABNORMAL LOW (ref 8.9–10.3)
Calcium: 8.8 mg/dL — ABNORMAL LOW (ref 8.9–10.3)
Chloride: 102 mmol/L (ref 98–111)
Chloride: 105 mmol/L (ref 98–111)
Creatinine, Ser: 2.06 mg/dL — ABNORMAL HIGH (ref 0.44–1.00)
Creatinine, Ser: 2.03 mg/dL — ABNORMAL HIGH (ref 0.44–1.00)
GFR calc Af Amer: 35 mL/min — ABNORMAL LOW (ref >60)
GFR calc Af Amer: 35 mL/min — ABNORMAL LOW (ref >60)
GFR calc non Af Amer: 30 mL/min — ABNORMAL LOW (ref >60)
GFR calc non Af Amer: 30 mL/min — ABNORMAL LOW (ref >60)
Glucose, Bld: 380 mg/dL — ABNORMAL HIGH (ref 70–99)
Glucose, Bld: 300 mg/dL — ABNORMAL HIGH (ref 70–99)
Potassium: 4.1 mmol/L (ref 3.5–5.1)
Potassium: 4.1 mmol/L (ref 3.5–5.1)
Sodium: 133 mmol/L — ABNORMAL LOW (ref 135–145)
Sodium: 134 mmol/L — ABNORMAL LOW (ref 135–145)

## 2019-08-29 LAB — GLUCOSE, CAPILLARY
Glucose-Capillary: 315 mg/dL — ABNORMAL HIGH (ref 70–99)
Glucose-Capillary: 245 mg/dL — ABNORMAL HIGH (ref 70–99)
Glucose-Capillary: 168 mg/dL — ABNORMAL HIGH (ref 70–99)
Glucose-Capillary: 321 mg/dL — ABNORMAL HIGH (ref 70–99)

## 2019-08-29 LAB — OSMOLALITY: Osmolality: 303 mOsm/kg — ABNORMAL HIGH (ref 275–295)

## 2019-08-29 LAB — CBC
HCT: 29.4 % — ABNORMAL LOW (ref 36.0–46.0)
Hemoglobin: 9 g/dL — ABNORMAL LOW (ref 12.0–15.0)
MCH: 25.4 pg — ABNORMAL LOW (ref 26.0–34.0)
MCHC: 30.6 g/dL (ref 30.0–36.0)
MCV: 82.8 fL (ref 80.0–100.0)
Platelets: 415 10*3/uL — ABNORMAL HIGH (ref 150–400)
RBC: 3.55 MIL/uL — ABNORMAL LOW (ref 3.87–5.11)
RDW: 14.1 % (ref 11.5–15.5)
WBC: 6.5 10*3/uL (ref 4.0–10.5)
nRBC: 0 % (ref 0.0–0.2)

## 2019-08-29 LAB — CK: Total CK: 49 U/L (ref 38–234)

## 2019-08-29 LAB — CREATININE, URINE, RANDOM: Creatinine, Urine: 73.1 mg/dL

## 2019-08-29 LAB — NA AND K (SODIUM & POTASSIUM), RAND UR
Potassium Urine: 16 mmol/L
Sodium, Ur: 52 mmol/L

## 2019-08-29 LAB — VANCOMYCIN, RANDOM: Vancomycin Rm: 22

## 2019-08-29 LAB — OSMOLALITY, URINE: Osmolality, Ur: 328 mOsm/kg (ref 300–900)

## 2019-08-29 MED ORDER — INSULIN ASPART 100 UNIT/ML ~~LOC~~ SOLN
6.0000 [IU] | Freq: Three times a day (TID) | SUBCUTANEOUS | Status: DC
  Administered 2019-08-29 – 2019-08-31 (×7): 6 [IU] via SUBCUTANEOUS

## 2019-08-29 MED ORDER — CEPHALEXIN 500 MG PO CAPS
500.0000 mg | ORAL_CAPSULE | Freq: Two times a day (BID) | ORAL | Status: DC
  Administered 2019-08-29 – 2019-08-31 (×4): 500 mg via ORAL
  Filled 2019-08-29 (×5): qty 1

## 2019-08-29 MED ORDER — HYDROMORPHONE HCL 1 MG/ML IJ SOLN
0.2500 mg | INTRAMUSCULAR | Status: DC | PRN
  Administered 2019-08-30: 23:00:00 0.5 mg via INTRAVENOUS
  Filled 2019-08-29: qty 0.5

## 2019-08-29 MED ORDER — HYDROMORPHONE HCL 1 MG/ML IJ SOLN
0.2500 mg | INTRAMUSCULAR | Status: DC | PRN
  Filled 2019-08-29: qty 5

## 2019-08-29 MED ORDER — INSULIN ASPART 100 UNIT/ML ~~LOC~~ SOLN
0.0000 [IU] | Freq: Every day | SUBCUTANEOUS | Status: DC
  Administered 2019-08-29: 4 [IU] via SUBCUTANEOUS
  Administered 2019-08-30: 3 [IU] via SUBCUTANEOUS

## 2019-08-29 MED ORDER — MORPHINE SULFATE (PF) 2 MG/ML IV SOLN: 2.0000 mg | INTRAVENOUS | Status: DC | PRN

## 2019-08-29 MED ORDER — INSULIN GLARGINE 100 UNIT/ML ~~LOC~~ SOLN
40.0000 [IU] | Freq: Every morning | SUBCUTANEOUS | Status: DC
  Administered 2019-08-29 – 2019-08-30 (×2): 40 [IU] via SUBCUTANEOUS
  Filled 2019-08-29 (×2): qty 0.4

## 2019-08-29 MED ORDER — HYDROCODONE-ACETAMINOPHEN 5-325 MG PO TABS
1.0000 | ORAL_TABLET | ORAL | Status: DC | PRN
  Administered 2019-08-30 – 2019-08-31 (×3): 1 via ORAL
  Filled 2019-08-29 (×3): qty 1

## 2019-08-29 MED ORDER — INSULIN ASPART 100 UNIT/ML ~~LOC~~ SOLN
0.0000 [IU] | Freq: Three times a day (TID) | SUBCUTANEOUS | Status: DC
  Administered 2019-08-29: 5 [IU] via SUBCUTANEOUS
  Administered 2019-08-29: 08:00:00 11 [IU] via SUBCUTANEOUS
  Administered 2019-08-29: 3 [IU] via SUBCUTANEOUS
  Administered 2019-08-30: 8 [IU] via SUBCUTANEOUS
  Administered 2019-08-30: 13:00:00 5 [IU] via SUBCUTANEOUS
  Administered 2019-08-30: 8 [IU] via SUBCUTANEOUS
  Administered 2019-08-31: 11 [IU] via SUBCUTANEOUS

## 2019-08-29 MED ORDER — LACTATED RINGERS IV SOLN
INTRAVENOUS | Status: DC
  Administered 2019-08-29 – 2019-08-31 (×3): via INTRAVENOUS

## 2019-08-29 NOTE — Progress Notes (Signed)
[facility] Progress Note  Patient: Lindsay Love KDT:[id number]   PCP: Robert Bellow, PA-C DOB: February 22, 1981   DOA: 08/27/2019   DOS: 08/29/2019   Date of Service: the patient was seen and examined on 08/29/2019  Chief Complaint  Patient presents with  . 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  Pt has two lesions in right foot, one inbottom of the right foot that has been going on for about 4 weeks, and another new lesion is in2nd toe on the right foot. No foot pain. Patient was seen at urgent care on 9/29. She hadX-ray done of the right foot, which showed chronic head collapse of the 2nd and 3rd metatarsal heads with possible chronic osteomyelitis. She was given shot of Rocephin and started on Clindamycin outpatient. Pt was also advised to follow up with podiatrist the next day which she did not do due to not having a ride. She went to [facility] today but left prior to being seen and came to this ED instead.No fever or chill. Patient also has nausea, vomiting and diffused abdominal tendernesssince last night.Reports she has vomited approximately 6-7 times today.  Patient denies chest pain, cough, shortness breath.  No symptoms of UTI or unilateral weakness.  Currently further plan is to treat AKI.  Subjective: Denies any acute complaint.  Continues to have pain in her leg.  No nausea no vomiting.  No fever no chills.  No chest pain no abdominal pain.  Assessment and Plan: Right foot infection:  has a lesion in the plantar area with mild erythema, no open wound, no tenderness.  Has a small area of redness and blister in the second toe of right foot, no tenderness.   She hadX-ray done of the right foot 9/29 at [facility], which showed chronic head collapse of the 2nd and 3rd metatarsal heads with possible  chronic osteomyelitis.   MRI "osteomyelitis involving the first metatarsal. Advanced head collapse involving the second and third metatarsal heads. This could be chronic osteomyelitis or AVN. I would expect more surrounding inflammatory changes and fluid if this was osteomyelitis." Discussed with orthopedics. Currently recommend that the patient does not require any antibiotic for osteomyelitis patient does not require any surgery for osteomyelitis and would recommend outpatient follow-up. Given that the ESR and the CRP is also normal I agree with this. We will transition to oral antibiotics to cover for cellulitis.  Acute kidney injury.  On chronic kidney disease stage III. Vancomycin induced injury? Patient's renal function slightly worsened with serum creatinine jumping from 1.0-2.0. Ultrasound renal unremarkable. Patient does not have any new complaints. We will monitor daily weight and ins and output Provide IV fluids. Initial work-up does not identify any etiology. FeNa 1.1-intrinsic etiology, Vanco random level is 22, last dose of vancomycin was on 1557 on 08/28/2019.   Asthma: stable -prn albuterol  GERD (gastroesophageal reflux disease): -Protonix  HTN:  -Holding home medications: Lisinopril -oral  hydralazine prn patient was given clonidine due to severe hypertension with sinus tachycardia.  Will hold that as well. Blood pressure trend has been stable last 24 hours.  Abdominal pain and Gastroparesis:  Her abdominal pain, nausea vomiting likely due to gastroparesis.  CT abdomen/pelvis is negative for acute intra-abdominal abnormalities. -PRN Zofran for nausea, scheduled Reglan - PRN Dilaudid for pain, reduce the dose.  Ovarian cyst: CT scan  showed 6 cm of left ovarian cyst. -Follow-up with CBC with repeated ultrasound  Elevated BMI Body mass index is 39.94 kg/m.   Diet: Carb modified diet  DVT Prophylaxis: Subcutaneous Heparin   Advance goals of care  discussion: Full code  Family Communication: no family was present at bedside, at the time of interview.   Disposition:  Discharge to be determined.  Consultants: Orthopedics Procedures: none  Scheduled Meds: . cephALEXin  500 mg Oral Q12H  . gabapentin  1,200 mg Oral BID  . heparin  5,000 Units Subcutaneous Q8H  . insulin aspart  0-15 Units Subcutaneous TID WC  . insulin aspart  0-5 Units Subcutaneous QHS  . insulin aspart  6 Units Subcutaneous TID WC  . insulin glargine  40 Units Subcutaneous q morning - 10a  . metoCLOPramide (REGLAN) injection  5 mg Intravenous Q8H  . pantoprazole  40 mg Oral Q0600   Continuous Infusions: . lactated ringers 100 mL/hr at 08/29/19 0816   PRN Meds: acetaminophen **OR** acetaminophen, albuterol, fluticasone, hydrALAZINE, HYDROcodone-acetaminophen, linaclotide, morphine injection, ondansetron, senna-docusate Antibiotics: Anti-infectives (From admission, onward)   Start     Dose/Rate Route Frequency Ordered Stop   08/29/19 2200  cephALEXin (KEFLEX) capsule 500 mg     500 mg Oral Every 12 hours 08/29/19 1637     08/28/19 1800  cefTRIAXone (ROCEPHIN) 2 g in sodium chloride 0.9 % 100 mL IVPB  Status:  Discontinued     2 g 200 mL/hr over 30 Minutes Intravenous Every 24 hours 08/28/19 1722 08/29/19 1637   08/28/19 1600  vancomycin (VANCOCIN) IVPB 1000 mg/200 mL premix  Status:  Discontinued     1,000 mg 200 mL/hr over 60 Minutes Intravenous Every 12 hours 08/28/19 0455 08/28/19 1722   08/28/19 0330  vancomycin (VANCOCIN) 2,000 mg in sodium chloride 0.9 % 500 mL IVPB     2,000 mg 250 mL/hr over 120 Minutes Intravenous  Once 08/28/19 0329 08/28/19 0556   08/27/19 1945  piperacillin-tazobactam (ZOSYN) IVPB 3.375 g     3.375 g 100 mL/hr over 30 Minutes Intravenous  Once 08/27/19 1935 08/27/19 2020      Objective: Physical Exam: Vitals:   08/28/19 1103 08/28/19 2258 08/29/19 0520 08/29/19 1325  BP: 130/88 124/81 (!) 119/57 (!) 171/94  Pulse: (!)  103 98 96 92  Resp:  _0 Temp:  98.3 F (36.8 C) 98.3 F (36.8 C) 97.7 F (36.5 C)  TempSrc:    Oral  SpO2: 96% 100% 97% 100%  Weight:      Height:        Intake/Output Summary (Last 24 hours) at 08/29/2019 1638 Last data filed at 08/29/2019 1400 Gross per 24 hour  Intake 1372.11 ml  Output 900 ml  Net 472.11 ml   Filed Weights   08/27/19 1829  Weight: 115.7 kg   General: alert and oriented to time, place, and person. Appear in mild distress, affect appropriate Eyes: PERRL, Conjunctiva normal ENT: Oral Mucosa Clear, moist  Neck: no JVD, no Abnormal Mass Or lumps Cardiovascular: S1 and S2 Present, no Murmur, peripheral pulses symmetrical Respiratory: good respiratory effort, Bilateral Air entry equal and Decreased, no signs of accessory muscle use, Clear to Auscultation, no Crackles, no wheezes Abdomen: Bowel Sound present, Soft and no tenderness, no hernia Skin: no rashes  Extremities: no Pedal edema, no calf tenderness Neurologic: without any new focal findings Gait not checked due to patient safety concerns  Data Reviewed: I have personally reviewed and interpreted daily  labs, tele strips, imagings as discussed above. I reviewed all nursing notes, pharmacy notes, vitals, pertinent old records I have discussed plan of care as described above with RN and patient/family.  CBC: Recent Labs  Lab 08/27/19 1922 08/28/19 0329 08/29/19 0343  WBC 6.8 6.2 6.5  HGB 10.9* 10.5* 9.0*  HCT 35.2* 34.6* 29.4*  MCV 79.6* 81.6 82.8  PLT 502* 419* 161*   Basic Metabolic Panel: Recent Labs  Lab 08/27/19 1922 08/28/19 0329 08/29/19 0343 08/29/19 0907  NA 132* 134* 133* 134*  K 4.6 4.3 4.1 4.1  CL 100 105 102 105  CO2 22 19* 21* 20*  GLUCOSE 220* 229* 380* 300*  BUN 24* 21* 35* 36*  CREATININE 1.03* 0.96 2.06* 2.03*  CALCIUM 9.2 8.9 8.4* 8.8*    Liver Function Tests: Recent Labs  Lab 08/27/19 1922  AST 19  ALT 20  ALKPHOS 77  BILITOT 0.4  PROT 8.2*  ALBUMIN  3.7   Recent Labs  Lab 08/27/19 1922  LIPASE 31   No results for input(s): AMMONIA in the last 168 hours. Coagulation Profile: Recent Labs  Lab 08/27/19 1930  INR 1.0   Cardiac Enzymes: Recent Labs  Lab 08/29/19 0907  CKTOTAL 49   BNP (last 3 results) No results for input(s): PROBNP in the last 8760 hours. CBG: Recent Labs  Lab 08/28/19 0419 08/28/19 1618 08/28/19 2202 08/29/19 0737 08/29/19 1138  GLUCAP 198* 204* 308* 315* 245*   Studies: US Renal  Result Date: 08/29/2019 CLINICAL DATA:  Acute kidney injury EXAM: RENAL / URINARY TRACT ULTRASOUND COMPLETE COMPARISON:  None. FINDINGS: Right Kidney: Renal measurements: 13.6 x 5.5 x 5.4 cm = volume: 211 mL . Echogenicity within normal limits. No mass or hydronephrosis visualized. Left Kidney: Renal measurements: 10.1 x 5.5 x 6.2 cm = volume: 181 mL. Echogenicity within normal limits. No mass or hydronephrosis visualized. Bladder: Appears normal for degree of bladder distention. IMPRESSION: Normal renal ultrasound. Electronically Signed   By: Kathreen Devoid   On: 08/29/2019 10:00    Time spent: 35 minutes  Author: Berle Mull, MD [facility] 08/29/2019 4:38 PM  To reach On-call, see care teams to locate the attending and reach out to them via [email or web address]. If 7PM-7AM, please contact night-coverage If you still have difficulty reaching the attending provider, please page the [facility] (Director on Call) for [facility] on amion for assistance.

## 2019-08-29 NOTE — Progress Notes (Signed)
Pt blood sugar 302 @ 2200. No HS correction insulin ordered. On-Call MD paged. Ordered 5 units novolog once.

## 2019-08-29 NOTE — Consult Note (Addendum)
ORTHOPAEDIC CONSULTATION  REQUESTING PHYSICIAN: Lavina Hamman, MD  Chief Complaint: Ulceration right foot first metatarsal head with blistering on the second  toe.  HPI: Lindsay Love is a 38 y.o. 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[place])    with vitreous hemorrhage and tractional retinal detachment, left eye   RLS (restless legs syndrome) 10/27/2017   Wears glasses    Past Surgical History:  Procedure Laterality Date   AMPUTATION Right 11/01/2017   Procedure: RIGHT FOOT 5TH RAY AMPUTATION;  Surgeon: Newt Minion, MD;  Location: [place];  Service: Orthopedics;  Laterality: Right;   CESAREAN SECTION     CHOLECYSTECTOMY     CYST EXCISION     INJECTION OF SILICONE OIL Left 5/64/3329   Procedure: Injection Of Silicone Oil;  Surgeon: Bernarda Caffey, MD;  Location: [place];  Service: Ophthalmology;  Laterality: Left;   MEMBRANE PEEL Left 07/23/2019   Procedure: Antoine Primas;  Surgeon: Bernarda Caffey, MD;  Location: [place];  Service: Ophthalmology;  Laterality: Left;   PARS PLANA VITRECTOMY Left 07/23/2019   Procedure: Pars Plana Vitrectomy With 25 Gauge;  Surgeon: Bernarda Caffey, MD;  Location: [place];  Service: Ophthalmology;  Laterality: Left;   PHOTOCOAGULATION WITH LASER Left 07/23/2019   Procedure: Photocoagulation With Laser;  Surgeon: Bernarda Caffey, MD;  Location: [place];  Service: Ophthalmology;  Laterality: Left;   REPAIR OF COMPLEX TRACTION RETINAL DETACHMENT Left 07/23/2019   Procedure: REPAIR OF COMPLEX  TRACTION RETINAL DETACHMENT;  Surgeon: Bernarda Caffey, MD;  Location: [place];  Service: Ophthalmology;  Laterality: Left;   TONSILLECTOMY     TOOTH EXTRACTION     Social History   Socioeconomic History   Marital status: Single    Spouse name: Not on file   Number of children: Not on file   Years of education: Not on file   Highest education level: Not on file  Occupational History   Occupation: unemployed  Social Designer, fashion/clothing strain: Not on file   Food insecurity    Worry: Not on file    Inability: Not on file   Transportation needs    Medical: Not on file    Non-medical: Not on file  Tobacco Use   Smoking status: Never Smoker   Smokeless tobacco: Never Used  Substance and Sexual Activity   Alcohol use: No   Drug use: No   Sexual activity: Not Currently    Birth control/protection: None  Lifestyle   Physical activity    Days per week: Not on file    Minutes per session: Not on file   Stress: Not on file  Relationships   Social connections    Talks on phone: Not on file    Gets together: Not on file    Attends religious service: Not on file    Active member of club or organization: Not on file    Attends meetings of clubs or organizations: Not on file  Relationship status: Not on file  Other Topics Concern   Not on file  Social History Narrative   Not on file   Family History  Problem Relation Age of Onset   Diabetes Mother    Cancer Mother    Irritable bowel syndrome Mother    Hypertension Mother    Sleep apnea Mother    Diabetes Father    Restless legs syndrome Father    - negative except otherwise stated in the family history section Allergies  Allergen Reactions   Morphine And Related Nausea And Vomiting   Prior to Admission medications   Medication Sig Start Date End Date Taking? Authorizing Provider  albuterol (PROVENTIL HFA;VENTOLIN HFA) 108 (90 Base) MCG/ACT inhaler Inhale 2 puffs into the lungs every 6  (six) hours as needed for wheezing or shortness of breath.   Yes [provider]  fluticasone (FLONASE) 50 MCG/ACT nasal spray Place 1 spray into both nostrils daily as needed for allergies.  02/11/19  Yes [provider]  gabapentin (NEURONTIN) 600 MG tablet Take 1,200 mg by mouth 2 (two) times daily.   Yes [provider]  insulin aspart (NOVOLOG) 100 UNIT/ML injection Inject 35 Units into the skin 3 (three) times daily with meals. Patient taking differently: Inject 25-50 Units into the skin See admin instructions. 3-4 times daily.  "Sliding scale plus 20 units." 11/02/17  Yes Rai, Ripudeep K, MD  Insulin Degludec (TRESIBA FLEXTOUCH) 200 UNIT/ML SOPN Inject 100 Units into the skin every morning.   Yes [provider]  linaclotide (LINZESS) 145 MCG CAPS capsule Take 145 mcg by mouth daily as needed (irritable bowel syndrome).   Yes [provider]  lisinopril (ZESTRIL) 30 MG tablet Take 30 mg by mouth daily. 06/02/19  Yes [provider]  loratadine (CLARITIN) 10 MG tablet Take 1 tablet (10 mg total) by mouth daily. 11/02/17  Yes Rai, Ripudeep K, MD  metFORMIN (GLUCOPHAGE) 1000 MG tablet Take 1,000 mg by mouth 2 (two) times daily with a meal.   Yes [provider]  pantoprazole (PROTONIX) 40 MG tablet Take 1 tablet (40 mg total) by mouth daily at 6 (six) AM. 09/10/18  Yes Hosie Poisson, MD  prednisoLONE acetate (PRED FORTE) 1 % ophthalmic suspension Place 1 drop into the left eye 4 (four) times daily. 07/31/19  Yes Bernarda Caffey, MD  tiZANidine (ZANAFLEX) 4 MG tablet Take 4 mg by mouth every 8 (eight) hours as needed for muscle spasms.  05/08/19  Yes [provider]  ACCU-CHEK FASTCLIX LANCETS MISC USE TO TEST BLOOD SUGAR 5 TIMES DAILY 12/22/18   Law, Bea Graff, PA-C  blood glucose meter kit and supplies Dispense based on patient and insurance preference. Use up to four times daily as directed. (FOR ICD-9 250.00, 250.01). 10/25/16    Patrecia Pour, Christean Grief, MD  Blood Glucose Monitoring Suppl (GLUCOCOM BLOOD GLUCOSE MONITOR) DEVI Check blood sugar as directed 02/25/19   [provider]  glucose blood (ACCU-CHEK GUIDE) test strip Use as instructed 12/22/18   Law, Bea Graff, PA-C  Insulin Pen Needle (FIFTY50 PEN NEEDLES) 31G X 5 MM MISC USE ONE NEEDLE WITH EACH INSULIN INJECTION 6 TIMES PER DAY. E10.65 02/25/19   [provider]   Ct Abdomen Pelvis W Contrast  Result Date: 08/27/2019 CLINICAL DATA:  38 year old female with abdominal pain. EXAM: CT ABDOMEN AND PELVIS WITH CONTRAST TECHNIQUE: Multidetector CT imaging of the abdomen and pelvis was performed using the standard protocol following bolus administration of intravenous contrast. CONTRAST:  186m OMNIPAQUE IOHEXOL 300 MG/ML  SOLN COMPARISON:  CT of the abdomen pelvis dated 09/06/2018 FINDINGS: Lower chest: The visualized lung bases are clear. No intra-abdominal free air or free fluid. Hepatobiliary: Apparent diffuse fatty infiltration of the liver. No intrahepatic biliary ductal dilatation. Cholecystectomy. Pancreas: Unremarkable. No pancreatic ductal dilatation or surrounding inflammatory changes. Spleen: Normal in size without focal abnormality. Adrenals/Urinary Tract: Adrenal glands are unremarkable. Kidneys are normal, without renal calculi, focal lesion, or hydronephrosis. Bladder is unremarkable. Stomach/Bowel: There is no bowel obstruction or active inflammation. The appendix is normal. Vascular/Lymphatic: The abdominal aorta and IVC are unremarkable. No portal venous gas. Multiple top-normal retroperitoneal lymph nodes, likely reactive. Reproductive: The uterus is anteverted, enlarged, and myomatous. There is a 6 cm left ovarian cyst. The right ovary is unremarkable. Ultrasound may provide better evaluation of pelvic structures. Other: None Musculoskeletal: No acute or significant osseous findings. IMPRESSION: 1. A 6 cm left ovarian cyst. Ultrasound may provide  better evaluation of pelvic structures. 2. Fatty liver. 3. No bowel obstruction or active inflammation. Normal appendix. Electronically Signed   By: AAnner CreteM.D.   On: 08/27/2019 22:25   [place]  Result Date: 08/29/2019 CLINICAL DATA:  Acute kidney injury EXAM: RENAL / URINARY TRACT ULTRASOUND COMPLETE COMPARISON:  None. FINDINGS: Right Kidney: Renal measurements: 13.6 x 5.5 x 5.4 cm = volume: 211 mL . Echogenicity within normal limits. No mass or hydronephrosis visualized. Left Kidney: Renal measurements: 10.1 x 5.5 x 6.2 cm = volume: 181 mL. Echogenicity within normal limits. No mass or hydronephrosis visualized. Bladder: Appears normal for degree of bladder distention. IMPRESSION: Normal renal ultrasound. Electronically Signed   By: HKathreen Devoid  On: 08/29/2019 10:00   Mr Foot Right Wo Contrast  Result Date: 08/28/2019 CLINICAL DATA:  Right foot pain and swelling. History of prior fifth toe amputation. Patient is diabetic. EXAM: MRI OF THE RIGHT FOREFOOT WITHOUT CONTRAST TECHNIQUE: Multiplanar, multisequence MR imaging of the right foot was performed. No intravenous contrast was administered. COMPARISON:  Radiographs 08/25/2019 and MRI 07/04/2018 FINDINGS: Diffuse abnormal increased T2 signal intensity in the mid distal aspect of the first metatarsal worrisome for osteomyelitis. There are also cystic changes in the metatarsal head which are likely degenerative. I do not see any definite findings for septic arthritis at the first MTP joint and the proximal phalanx is intact. Advanced head collapse involving the second and third metatarsal heads with associated deformity. I do not see any significant surrounding fluid or marked inflammatory changes. There is some signal abnormality in the proximal phalanx of the third proximal phalanx. Chronic osteomyelitis versus avascular necrosis and head collapse. I do not see any significant changes of cellulitis. There is mild myositis involving the short  flexor muscles and interosseous muscles. No findings to suggest pyomyositis. Moderate midfoot degenerative changes with patchy marrow edema in the cuboid. IMPRESSION: 1. Findings suspicious for osteomyelitis involving the first metatarsal. 2. Advanced head collapse involving the second and third metatarsal heads. This could be chronic osteomyelitis or AVN. I would expect more surrounding inflammatory changes and fluid if this was osteomyelitis. 3. Remote fifth ray resection. 4. Mild cellulitic change and mild myositis. 5. Midfoot degenerative changes. Electronically Signed   By: PMarijo SanesM.D.   On: 08/28/2019 15:20   Vas UKoreaABurnard BuntingWith/wo Tbi  Result Date: 08/28/2019 LOWER EXTREMITY DOPPLER STUDY Indications: Ulceration.  Vascular Interventions: 11/01/2017 - Right 5th ray amputation. Comparison Study: No prior studies. Performing Technologist: CCarlos LeveringRVT  Examination Guidelines: A  complete evaluation includes at minimum, Doppler waveform signals and systolic blood pressure reading at the level of bilateral brachial, anterior tibial, and posterior tibial arteries, when vessel segments are accessible. Bilateral testing is considered an integral part of a complete examination. Photoelectric Plethysmograph (PPG) waveforms and toe systolic pressure readings are included as required and additional duplex testing as needed. Limited examinations for reoccurring indications may be performed as noted.  ABI Findings: +--------+------------------+-----+---------+--------+  Right    Rt Pressure (mmHg) Index Waveform  Comment   +--------+------------------+-----+---------+--------+  Brachial 199                                          +--------+------------------+-----+---------+--------+  PTA      212                1.07  triphasic           +--------+------------------+-----+---------+--------+  DP       226                1.14  triphasic           +--------+------------------+-----+---------+--------+  +--------+------------------+-----+---------+-------+  Left     Lt Pressure (mmHg) Index Waveform  Comment  +--------+------------------+-----+---------+-------+  Brachial                                    IV       +--------+------------------+-----+---------+-------+  PTA      201                1.01  triphasic          +--------+------------------+-----+---------+-------+  DP       214                1.08  triphasic          +--------+------------------+-----+---------+-------+ +-------+-----------+-----------+------------+------------+  ABI/TBI Today's ABI Today's TBI Previous ABI Previous TBI  +-------+-----------+-----------+------------+------------+  Right   1.14                                               +-------+-----------+-----------+------------+------------+  Left    1.08                                               +-------+-----------+-----------+------------+------------+  Summary: Right: Resting right ankle-brachial index is within normal range. No evidence of significant right lower extremity arterial disease. Left: Resting left ankle-brachial index is within normal range. No evidence of significant left lower extremity arterial disease.  *See table(s) above for measurements and observations.  Electronically signed by Monica Martinez MD on 08/28/2019 at 4:33:27 PM.   Final    - pertinent xrays, CT, MRI studies were reviewed and independently interpreted  Positive ROS: All other systems have been reviewed and were otherwise negative with the exception of those mentioned in the HPI and as above.  Physical Exam: General: Alert, no acute distress Psychiatric: Patient is competent for consent with normal mood and affect Lymphatic: No axillary or cervical lymphadenopathy Cardiovascular: No pedal edema Respiratory: No cyanosis, no use of accessory musculature GI:  No organomegaly, abdomen is soft and non-tender    Images:  _0 @  Labs:  Lab Results  Component Value Date     HGBA1C 8.0 (H) 08/28/2019   HGBA1C 13.0 (H) 10/27/2017   HGBA1C 11.5 (H) 10/22/2016   ESRSEDRATE 40 (H) 08/28/2019   ESRSEDRATE 32 (H) 11/17/2017   ESRSEDRATE 70 (H) 10/22/2016   CRP <0.8 08/28/2019   REPTSTATUS PENDING 08/27/2019   REPTSTATUS 08/28/2019 FINAL 08/27/2019   GRAMSTAIN  10/29/2014    FEW WBC PRESENT, PREDOMINANTLY PMN NO SQUAMOUS EPITHELIAL CELLS SEEN FEW GRAM POSITIVE RODS RARE GRAM POSITIVE COCCI IN PAIRS    CULT  08/27/2019    NO GROWTH < 12 HOURS Performed at [facility], [place] [address], [place], [place] [address]    CULT  08/27/2019    NO GROWTH Performed at [facility] [address], [place], [place] [address]    LABORGA ESCHERICHIA COLI (A) 05/03/2017    Lab Results  Component Value Date   ALBUMIN 3.7 08/27/2019   ALBUMIN 3.8 09/06/2018   ALBUMIN 3.4 (L) 07/20/2018    Neurologic: Patient does not have protective sensation bilateral lower extremities.   MUSCULOSKELETAL:   Skin: Lamination of the right foot patient has a strong dorsalis pedis and posterior tibial pulse the ankle-brachial indices also confirms her good circulation to the right lower extremity.  Patient has an extremely tight Achilles with dorsiflexion 20 degrees short of neutral with the knee extended.  There is no redness no cellulitis no open ulcers on the right foot.  She has a blister on the second toe and a callus beneath the first metatarsal head secondary to the Achilles contracture.  Review of the MRI scan shows increased edema in the first metatarsal as well as collapse of the second and third metatarsal heads both of these are consistent with her Achilles contracture and overloading the forefoot.  Patient has poorly controlled type 2 diabetes with a hemoglobin A1c ranging from 13-8.  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  Patient most likely will require a gastrocnemius recession as an outpatient.  Thank you for the consult and the opportunity to see Ms. Derrell Lolling, MD [facility] [phone] 10:48 AM

## 2019-08-30 DIAGNOSIS — N1831 Chronic kidney disease, stage 3a: Secondary | ICD-10-CM

## 2019-08-30 DIAGNOSIS — N179 Acute kidney failure, unspecified: Secondary | ICD-10-CM

## 2019-08-30 DIAGNOSIS — N83209 Unspecified ovarian cyst, unspecified side: Secondary | ICD-10-CM

## 2019-08-30 DIAGNOSIS — L03115 Cellulitis of right lower limb: Principal | ICD-10-CM

## 2019-08-30 DIAGNOSIS — J4530 Mild persistent asthma, uncomplicated: Secondary | ICD-10-CM

## 2019-08-30 DIAGNOSIS — E11610 Type 2 diabetes mellitus with diabetic neuropathic arthropathy: Secondary | ICD-10-CM

## 2019-08-30 DIAGNOSIS — N83202 Unspecified ovarian cyst, left side: Secondary | ICD-10-CM

## 2019-08-30 LAB — GLUCOSE, CAPILLARY
Glucose-Capillary: 274 mg/dL — ABNORMAL HIGH (ref 70–99)
Glucose-Capillary: 229 mg/dL — ABNORMAL HIGH (ref 70–99)
Glucose-Capillary: 257 mg/dL — ABNORMAL HIGH (ref 70–99)
Glucose-Capillary: 263 mg/dL — ABNORMAL HIGH (ref 70–99)

## 2019-08-30 LAB — CBC
HCT: 29.2 % — ABNORMAL LOW (ref 36.0–46.0)
Hemoglobin: 8.9 g/dL — ABNORMAL LOW (ref 12.0–15.0)
MCH: 24.9 pg — ABNORMAL LOW (ref 26.0–34.0)
MCHC: 30.5 g/dL (ref 30.0–36.0)
MCV: 81.6 fL (ref 80.0–100.0)
Platelets: 366 10*3/uL (ref 150–400)
RBC: 3.58 MIL/uL — ABNORMAL LOW (ref 3.87–5.11)
RDW: 14 % (ref 11.5–15.5)
WBC: 6.5 10*3/uL (ref 4.0–10.5)
nRBC: 0 % (ref 0.0–0.2)

## 2019-08-30 LAB — BASIC METABOLIC PANEL
Anion gap: 8 (ref 5–15)
BUN: 30 mg/dL — ABNORMAL HIGH (ref 6–20)
CO2: 19 mmol/L — ABNORMAL LOW (ref 22–32)
Calcium: 8.6 mg/dL — ABNORMAL LOW (ref 8.9–10.3)
Chloride: 108 mmol/L (ref 98–111)
Creatinine, Ser: 1.75 mg/dL — ABNORMAL HIGH (ref 0.44–1.00)
GFR calc Af Amer: 42 mL/min — ABNORMAL LOW (ref >60)
GFR calc non Af Amer: 36 mL/min — ABNORMAL LOW (ref >60)
Glucose, Bld: 271 mg/dL — ABNORMAL HIGH (ref 70–99)
Potassium: 4.3 mmol/L (ref 3.5–5.1)
Sodium: 135 mmol/L (ref 135–145)

## 2019-08-30 MED ORDER — LABETALOL HCL 5 MG/ML IV SOLN
10.0000 mg | INTRAVENOUS | Status: DC | PRN
  Administered 2019-08-30 – 2019-08-31 (×3): 10 mg via INTRAVENOUS
  Filled 2019-08-30 (×4): qty 4

## 2019-08-30 MED ORDER — DIPHENHYDRAMINE HCL 25 MG PO CAPS
25.0000 mg | ORAL_CAPSULE | Freq: Once | ORAL | Status: AC
  Administered 2019-08-30: 25 mg via ORAL
  Filled 2019-08-30: qty 1

## 2019-08-30 MED ORDER — INSULIN GLARGINE 100 UNIT/ML ~~LOC~~ SOLN
50.0000 [IU] | Freq: Every morning | SUBCUTANEOUS | Status: DC
  Administered 2019-08-31: 50 [IU] via SUBCUTANEOUS
  Filled 2019-08-30 (×2): qty 0.5

## 2019-08-30 NOTE — Progress Notes (Signed)
Assuming nursing care for patient; pt resting comfortably in bed with no complaints. No signs and symptoms of distress. Elevated BP noted per flowsheet; PRN hydralazine given; See [facility]

## 2019-08-30 NOTE — Progress Notes (Addendum)
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  Patient had 2 lesions right foot 1 and bottom of the right foot which has been going on for 4 weeks and also a lesion on second toe of right foot.  X-ray of the foot showed chronic head collapse of second and third metatarsal heads with possible chronic osteomyelitis.  Orthopedics was consulted, Dr. Sharol Given did not recommend surgery at this time.  Recommended outpatient follow-up.  No antibiotics recommended.  ESR and CRP are also normal.   Subjective   Patient seen and examined, complains of foot swelling.  Renal function is slowly improving.   Assessment/Plan:     1. 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  MRI foot showed head collapse involving second and third metatarsal heads.  No immediate surgery recommended by orthopedics.  No antibiotics needed.  Ortho will follow-up as outpatient.  2. Right foot cellulitis-resolved, do not appreciate any erythema in the foot.  Patient was started on antibiotics.  She is received 3 days of antibiotics.  Will complete 2 more days of Keflex and then stop.  3. Acute kidney injury on CKD stage III-patient has vancomycin induced injury, creatinine jumped from 1.0-2.0.  Slowly improving with IV normal saline.  Today creatinine is 1.75.  Follow BMP in a.m.  4. Asthma-stable, continue PRN albuterol.  5. GERD-continue Protonix.  6. Hypertension-lisinopril is on hold.  7. Abdominal pain/gastroparesis-CT abdomen pelvis is negative for acute abdominal abnormality.  Continue Zofran PRN for nausea vomiting.  8. Ovarian cyst-CT  abdomen/pelvis showed 6 mm left ovarian cyst.  Follow-up outpatient with pelvic ultrasound.  9. Diabetes mellitus type 2-continue sliding scale insulin NovoLog.  Lantus 40 units subcu daily.  CBG is elevated, will change Lantus to 50 units subcu daily.  Continue sliding scale insulin with NovoLog.  NovoLog 6 units 3 times daily meal coverage.       CBG: Recent Labs  Lab 08/29/19 1138 08/29/19 1644 08/29/19 2151 08/30/19 0734 08/30/19 1125  GLUCAP 245* 168* 321* 274* 229*    CBC: Recent Labs  Lab 08/27/19 1922 08/28/19 0329 08/29/19 0343 08/30/19 0250  WBC 6.8 6.2 6.5 6.5  HGB 10.9* 10.5* 9.0* 8.9*  HCT 35.2* 34.6* 29.4* 29.2*  MCV 79.6* 81.6 82.8 81.6  PLT 502* 419* 415* 462    Basic Metabolic Panel: Recent Labs  Lab 08/27/19 1922 08/28/19 0329 08/29/19 0343 08/29/19 0907 08/30/19 0250  NA 132* 134* 133* 134* 135  K 4.6 4.3 4.1 4.1 4.3  CL 100 105 102 105 108  CO2 22 19* 21* 20* 19*  GLUCOSE 220* 229* 380* 300* 271*  BUN 24* 21* 35* 36* 30*  CREATININE 1.03* 0.96 2.06* 2.03* 1.75*  CALCIUM 9.2 8.9 8.4* 8.8* 8.6*     DVT prophylaxis: Heparin  Code Status: Full code  Family Communication: No family at bedside  Disposition Plan: likely home when medically ready for discharge     BMI  Estimated body mass index is 41.54 kg/m as calculated from the following:   Height as of this encounter: _0  (1.702 m).   Weight as of this encounter: 120.3 kg.  Scheduled medications:  . cephALEXin  500 mg Oral Q12H  .  gabapentin  1,200 mg Oral BID  . heparin  5,000 Units Subcutaneous Q8H  . insulin aspart  0-15 Units Subcutaneous TID WC  . insulin aspart  0-5 Units Subcutaneous QHS  . insulin aspart  6 Units Subcutaneous TID WC  . insulin glargine  40 Units Subcutaneous q morning - 10a  . metoCLOPramide (REGLAN) injection  5 mg Intravenous Q8H  . pantoprazole  40 mg Oral Q0600    Consultants:  Orthopedics  Procedures:     Antibiotics:    Anti-infectives (From admission, onward)   Start     Dose/Rate Route Frequency Ordered Stop   08/29/19 1800  cephALEXin (KEFLEX) capsule 500 mg     500 mg Oral Every 12 hours 08/29/19 1637     08/28/19 1800  cefTRIAXone (ROCEPHIN) 2 g in sodium chloride 0.9 % 100 mL IVPB  Status:  Discontinued     2 g 200 mL/hr over 30 Minutes Intravenous Every 24 hours 08/28/19 1722 08/29/19 1637   08/28/19 1600  vancomycin (VANCOCIN) IVPB 1000 mg/200 mL premix  Status:  Discontinued     1,000 mg 200 mL/hr over 60 Minutes Intravenous Every 12 hours 08/28/19 0455 08/28/19 1722   08/28/19 0330  vancomycin (VANCOCIN) 2,000 mg in sodium chloride 0.9 % 500 mL IVPB     2,000 mg 250 mL/hr over 120 Minutes Intravenous  Once 08/28/19 0329 08/28/19 0556   08/27/19 1945  piperacillin-tazobactam (ZOSYN) IVPB 3.375 g     3.375 g 100 mL/hr over 30 Minutes Intravenous  Once 08/27/19 1935 08/27/19 2020       Objective   Vitals:   08/29/19 0520 08/29/19 1325 08/29/19 2151 08/30/19 0439  BP: (!) 119/57 (!) 171/94 (!) 172/90 (!) 158/91  Pulse: 96 92 (!) 101 (!) 107  Resp: _0 Temp: 98.3 F (36.8 C) 97.7 F (36.5 C) 98 F (36.7 C) 98.6 F (37 C)  TempSrc:  Oral Oral Oral  SpO2: 97% 100% 98% 94%  Weight:    120.3 kg  Height:        Intake/Output Summary (Last 24 hours) at 08/30/2019 1401 Last data filed at 08/30/2019 1000 Gross per 24 hour  Intake 2912.9 ml  Output 900 ml  Net 2012.9 ml   Filed Weights   08/27/19 1829 08/30/19 0439  Weight: 115.7 kg 120.3 kg     Physical Examination:   General-appears in no acute distress Heart-S1-S2, regular, no murmur auscultated Lungs-clear to auscultation bilaterally, no wheezing or crackles auscultated Abdomen-soft, nontender, no organomegaly Extremities-right foot has mild edema, status post fifth toe amputation, no erythema noted.  Nontender to palpation. Neuro-alert, oriented x3, no focal deficit noted    Data Reviewed: I have personally  reviewed following labs and imaging studies   Recent Results (from the past 240 hour(s))  Blood Culture (routine x 2)     Status: None (Preliminary result)   Collection Time: 08/27/19  7:23 PM   Specimen: BLOOD LEFT ARM  Result Value Ref Range Status   Specimen Description   Final    BLOOD LEFT ARM Performed at [facility], [address] [place], [place] [address]    Special Requests   Final    BOTTLES DRAWN AEROBIC AND ANAEROBIC Blood Culture adequate volume Performed at [facility], [address] [place], [place] [address]    Culture   Final    NO GROWTH 3 DAYS Performed at [facility], [place] [address],  [place], [place] [address]    Report Status PENDING  Incomplete  Blood Culture (routine x 2)     Status: None (Preliminary result)   Collection Time: 08/27/19  9:00 PM   Specimen: BLOOD  Result Value Ref Range Status   Specimen Description   Final    BLOOD BLOOD RIGHT HAND Performed at [facility], [address] [place], [place] [address]    Special Requests   Final    BOTTLES DRAWN AEROBIC AND ANAEROBIC Blood Culture adequate volume Performed at [facility], [address] [place], [place] [address]    Culture   Final    NO GROWTH 3 DAYS Performed at [facility], [place] [address], [place], [place] [address]    Report Status PENDING  Incomplete  Urine culture     Status: None   Collection Time: 08/27/19  9:00 PM   Specimen: In/Out Cath Urine  Result Value Ref Range Status   Specimen Description   Final    IN/OUT CATH URINE Performed at [facility], [address] [place], [place] [address]    Special Requests   Final    NONE Performed at [facility], [address] [place], [place] [address]    Culture   Final    NO GROWTH Performed at [facility], [place] [address] [place], [place] [address]    Report Status 08/28/2019 FINAL  Final  SARS CORONAVIRUS 2 (TAT 6-24 HRS)  Nasopharyngeal Nasopharyngeal Swab     Status: None   Collection Time: 08/27/19 11:05 PM   Specimen: Nasopharyngeal Swab  Result Value Ref Range Status   SARS Coronavirus 2 NEGATIVE NEGATIVE Final    Comment: (NOTE) SARS-CoV-2 target nucleic acids are NOT DETECTED. The SARS-CoV-2 RNA is generally detectable in upper and lower respiratory specimens during the acute phase of infection. Negative results do not preclude SARS-CoV-2 infection, do not rule out co-infections with other pathogens, and should not be used as the sole basis for treatment or other patient management decisions. Negative results must be combined with clinical observations, patient history, and epidemiological information. The expected result is Negative. Fact Sheet for Patients: SugarRoll.be Fact Sheet for Healthcare Providers: https://www.woods-mathews.com/ This test is not yet approved or cleared by the [place] FDA and  has been authorized for detection and/or diagnosis of SARS-CoV-2 by FDA under an Emergency Use Authorization (EUA). This EUA will remain  in effect (meaning this test can be used) for the duration of the COVID-19 declaration under Section 56 4(b)(1) of the Act, 21 U.S.C. section 360bbb-3(b)(1), unless the authorization is terminated or revoked sooner. Performed at [facility], [place] [address], [place], [place] [address]      Liver Function Tests: Recent Labs  Lab 08/27/19 1922  AST 19  ALT 20  ALKPHOS 77  BILITOT 0.4  PROT 8.2*  ALBUMIN 3.7   Recent Labs  Lab 08/27/19 1922  LIPASE 31   No results for input(s): AMMONIA in the last 168 hours.  Cardiac Enzymes: Recent Labs  Lab 08/29/19 0907  CKTOTAL 49   BNP (last 3 results) No results for input(s): BNP in the last 8760 hours.  ProBNP (last 3 results) No results for input(s): PROBNP in the last 8760 hours.    Studies: US Renal  Result Date: 08/29/2019 CLINICAL  DATA:  Acute kidney injury EXAM: RENAL / URINARY TRACT ULTRASOUND COMPLETE COMPARISON:  None. FINDINGS: Right  Kidney: Renal measurements: 13.6 x 5.5 x 5.4 cm = volume: 211 mL . Echogenicity within normal limits. No mass or hydronephrosis visualized. Left Kidney: Renal measurements: 10.1 x 5.5 x 6.2 cm = volume: 181 mL. Echogenicity within normal limits. No mass or hydronephrosis visualized. Bladder: Appears normal for degree of bladder distention. IMPRESSION: Normal renal ultrasound. Electronically Signed   By: Kathreen Devoid   On: 08/29/2019 10:00   Mr Foot Right Wo Contrast  Result Date: 08/28/2019 CLINICAL DATA:  Right foot pain and swelling. History of prior fifth toe amputation. Patient is diabetic. EXAM: MRI OF THE RIGHT FOREFOOT WITHOUT CONTRAST TECHNIQUE: Multiplanar, multisequence MR imaging of the right foot was performed. No intravenous contrast was administered. COMPARISON:  Radiographs 08/25/2019 and MRI 07/04/2018 FINDINGS: Diffuse abnormal increased T2 signal intensity in the mid distal aspect of the first metatarsal worrisome for osteomyelitis. There are also cystic changes in the metatarsal head which are likely degenerative. I do not see any definite findings for septic arthritis at the first MTP joint and the proximal phalanx is intact. Advanced head collapse involving the second and third metatarsal heads with associated deformity. I do not see any significant surrounding fluid or marked inflammatory changes. There is some signal abnormality in the proximal phalanx of the third proximal phalanx. Chronic osteomyelitis versus avascular necrosis and head collapse. I do not see any significant changes of cellulitis. There is mild myositis involving the short flexor muscles and interosseous muscles. No findings to suggest pyomyositis. Moderate midfoot degenerative changes with patchy marrow edema in the cuboid. IMPRESSION: 1. Findings suspicious for osteomyelitis involving the first metatarsal. 2.  Advanced head collapse involving the second and third metatarsal heads. This could be chronic osteomyelitis or AVN. I would expect more surrounding inflammatory changes and fluid if this was osteomyelitis. 3. Remote fifth ray resection. 4. Mild cellulitic change and mild myositis. 5. Midfoot degenerative changes. Electronically Signed   By: Marijo Sanes M.D.   On: 08/28/2019 15:20     Admission status: Inpatient: Based on patients clinical presentation and evaluation of above clinical data, I have made determination that patient meets Inpatient criteria at this time.  Time spent: 20 min  [facility] Pager [phone]. If 7PM-7AM, please contact night-coverage at [email or web address], Office  [phone]  password [email or web address]  08/30/2019, 2:01 PM  LOS: 3 days

## 2019-08-31 ENCOUNTER — Other Ambulatory Visit (INDEPENDENT_AMBULATORY_CARE_PROVIDER_SITE_OTHER): Payer: Self-pay | Admitting: Ophthalmology

## 2019-08-31 DIAGNOSIS — J4530 Mild persistent asthma, uncomplicated: Secondary | ICD-10-CM

## 2019-08-31 DIAGNOSIS — N1831 Chronic kidney disease, stage 3a: Secondary | ICD-10-CM

## 2019-08-31 DIAGNOSIS — N83209 Unspecified ovarian cyst, unspecified side: Secondary | ICD-10-CM

## 2019-08-31 LAB — CBC
HCT: 25.8 % — ABNORMAL LOW (ref 36.0–46.0)
Hemoglobin: 8.1 g/dL — ABNORMAL LOW (ref 12.0–15.0)
MCH: 25.2 pg — ABNORMAL LOW (ref 26.0–34.0)
MCHC: 31.4 g/dL (ref 30.0–36.0)
MCV: 80.4 fL (ref 80.0–100.0)
Platelets: 337 10*3/uL (ref 150–400)
RBC: 3.21 MIL/uL — ABNORMAL LOW (ref 3.87–5.11)
RDW: 13.8 % (ref 11.5–15.5)
WBC: 5.8 10*3/uL (ref 4.0–10.5)
nRBC: 0 % (ref 0.0–0.2)

## 2019-08-31 LAB — BASIC METABOLIC PANEL
Anion gap: 7 (ref 5–15)
BUN: 28 mg/dL — ABNORMAL HIGH (ref 6–20)
CO2: 22 mmol/L (ref 22–32)
Calcium: 8.5 mg/dL — ABNORMAL LOW (ref 8.9–10.3)
Chloride: 106 mmol/L (ref 98–111)
Creatinine, Ser: 1.42 mg/dL — ABNORMAL HIGH (ref 0.44–1.00)
GFR calc Af Amer: 54 mL/min — ABNORMAL LOW (ref >60)
GFR calc non Af Amer: 47 mL/min — ABNORMAL LOW (ref >60)
Glucose, Bld: 330 mg/dL — ABNORMAL HIGH (ref 70–99)
Potassium: 4.4 mmol/L (ref 3.5–5.1)
Sodium: 135 mmol/L (ref 135–145)

## 2019-08-31 LAB — GLUCOSE, CAPILLARY: Glucose-Capillary: 310 mg/dL — ABNORMAL HIGH (ref 70–99)

## 2019-08-31 MED ORDER — ONDANSETRON 4 MG PO TBDP: 4.0000 mg | ORAL_TABLET | Freq: Three times a day (TID) | ORAL | 0 refills | Status: AC | PRN

## 2019-08-31 MED ORDER — CEPHALEXIN 500 MG PO CAPS: 500.0000 mg | ORAL_CAPSULE | Freq: Two times a day (BID) | ORAL | 0 refills | Status: AC

## 2019-08-31 MED ORDER — BRIMONIDINE TARTRATE 0.2 % OP SOLN: 1.0000 [drp] | Freq: Three times a day (TID) | OPHTHALMIC | 4 refills | Status: DC

## 2019-08-31 MED ORDER — HYDROCODONE-ACETAMINOPHEN 5-325 MG PO TABS: 1.0000 | ORAL_TABLET | ORAL | 0 refills | Status: DC | PRN

## 2019-08-31 MED ORDER — INSULIN ASPART 100 UNIT/ML ~~LOC~~ SOLN: 8.0000 [IU] | Freq: Three times a day (TID) | SUBCUTANEOUS | Status: DC

## 2019-08-31 MED ORDER — FLUCONAZOLE 100 MG PO TABS: 100.0000 mg | ORAL_TABLET | Freq: Every day | ORAL | 0 refills | Status: AC

## 2019-08-31 MED ORDER — DORZOLAMIDE HCL-TIMOLOL MAL 2-0.5 % OP SOLN: 1.0000 [drp] | Freq: Two times a day (BID) | OPHTHALMIC | 4 refills | Status: AC

## 2019-08-31 MED ORDER — LABETALOL HCL 100 MG PO TABS: 100.0000 mg | ORAL_TABLET | Freq: Two times a day (BID) | ORAL | 2 refills | Status: AC

## 2019-08-31 MED ORDER — LABETALOL HCL 100 MG PO TABS
100.0000 mg | ORAL_TABLET | Freq: Two times a day (BID) | ORAL | Status: DC
  Administered 2019-08-31: 100 mg via ORAL
  Filled 2019-08-31: qty 1

## 2019-08-31 NOTE — Plan of Care (Signed)
Patient discharged to home and transported to front by wheelchair. Instructions were reviewed and all questions were answered. No needs expressed.

## 2019-08-31 NOTE — Discharge Summary (Signed)
Physician Discharge Summary  Lindsay Love ZOX:[id number] DOB: 12-Apr-1981 DOA: 08/27/2019  PCP: Robert Bellow, PA-C  Admit date: 08/27/2019 Discharge date: 08/31/2019  Time spent: 40 minutes  Recommendations for Outpatient Follow-up:  1. Recheck BMP in 3 days 2. Check CBC in 3 days 3. Ovarian cyst seen on CT abdomen/pelvis, will need pelvic ultrasound as outpatient 4. Hold metformin until renal function improves. 5. Lisinopril is also held due to renal insufficiency. 6. Start taking labetalol 100 g p.o. twice daily.   Discharge Diagnoses:  Principal Problem:   Right foot infection Active Problems:   Asthma   GERD (gastroesophageal reflux disease)   CKD (chronic kidney disease), stage III   HTN (hypertension)   Abdominal pain   Gastroparesis   Ovarian cyst   Achilles tendon contracture, right   Diabetic polyneuropathy associated with type 2 diabetes mellitus ([place])   Discharge Condition: Stable  Diet recommendation: Heart healthy diet  Filed Weights   08/27/19 1829 08/30/19 0439 08/31/19 0548  Weight: 115.7 kg 120.3 kg 121 kg    History of present illness:  38 year old female, with history of hypertension, diabetes mellitus type 2, gastroparesis, asthma, GERD, RLS, IBS, 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  Patient had 2 lesions right foot 1 and bottom of the right foot which has been going on for 4 weeks and also a lesion on second toe of right foot.  X-ray of the foot showed chronic head collapse of second and third metatarsal heads with possible chronic osteomyelitis.  Orthopedics was consulted, Dr. Sharol Given did not recommend surgery at this time.  Recommended outpatient follow-up.  No antibiotics recommended.  ESR and CRP are also normal  Hospital Course:   1. 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  MRI foot showed head collapse  involving second and third metatarsal heads.  No immediate surgery recommended by orthopedics.  No antibiotics needed.  Ortho will follow-up as outpatient.  2. Right foot cellulitis-resolved, do not appreciate any erythema in the foot.  Patient was started on antibiotics.  She is received 3 days of antibiotics.  Will complete 2 more days of Keflex and then stop.  3. Acute kidney injury on CKD stage III-patient has vancomycin induced injury, creatinine jumped from 1.0-2.0.  Slowly improving with IV normal saline.  Today creatinine is 1.42.  She will need to check BMP in 3 days time.  4. Asthma-stable, continue PRN albuterol.  5. GERD-continue Protonix.  6. Hypertension-lisinopril is on hold.  7. Anemia of chronic disease-patient has chronic anemia with baseline hemoglobin around 10-11.  Hemoglobin dropped in the hospital after IV fluids were given for dehydration.  Today hemoglobin is 8.1.  Recommend to recheck CBC in 3 days.  7. Abdominal pain/gastroparesis-CT abdomen pelvis is negative for acute abdominal abnormality.  Continue Zofran PRN for nausea vomiting.  8. Ovarian cyst-CT abdomen/pelvis showed 6 mm left ovarian cyst.  Follow-up outpatient with pelvic ultrasound.  9. Diabetes mellitus type 2-continue home regimen.  Metformin is on hold due to worsening renal function.  Recommended to start taking metformin once kidney function improves.  She will follow-up with the PCP regarding further recommendations.   Procedures:    Consultations:  Orthopedics  Discharge Exam: Vitals:   08/31/19 0801 08/31/19 0920  BP: (!) 183/104 (!) 167/82  Pulse: 89 94  Resp: 18 16  Temp: 98 F (36.7 C)   SpO2: 98%     General: Appears  in no acute distress Cardiovascular: S1-S2, regular Respiratory: Clear to auscultation bilaterally  Discharge Instructions   Discharge Instructions    Diet - low sodium heart healthy   Complete by: As directed    Discharge instructions   Complete  by: As directed    Get your labs checked in 3 days for kidney function at PCP office Hold Metformin till your kidney function returns to normal Hold Lisinopril till you see your PCP You will need pelvic ultrasound as outpatient for ovarian cyst seen on CT scan   Increase activity slowly   Complete by: As directed      Allergies as of 08/31/2019      Reactions   Morphine And Related Nausea And Vomiting      Medication List    STOP taking these medications   bacitracin-polymyxin b ophthalmic ointment Commonly known as: POLYSPORIN   lisinopril 30 MG tablet Commonly known as: ZESTRIL     TAKE these medications   Accu-Chek FastClix Lancets Misc USE TO TEST BLOOD SUGAR 5 TIMES DAILY   albuterol 108 (90 Base) MCG/ACT inhaler Commonly known as: VENTOLIN HFA Inhale 2 puffs into the lungs every 6 (six) hours as needed for wheezing or shortness of breath.   blood glucose meter kit and supplies Dispense based on patient and insurance preference. Use up to four times daily as directed. (FOR ICD-9 250.00, 250.01).   cephALEXin 500 MG capsule Commonly known as: KEFLEX Take 1 capsule (500 mg total) by mouth 2 (two) times daily.   Fifty50 Pen Needles 31G X 5 MM Misc Generic drug: Insulin Pen Needle USE ONE NEEDLE WITH EACH INSULIN INJECTION 6 TIMES PER DAY. E10.65   fluconazole 100 MG tablet Commonly known as: Diflucan Take 1 tablet (100 mg total) by mouth daily for 3 days.   fluticasone 50 MCG/ACT nasal spray Commonly known as: FLONASE Place 1 spray into both nostrils daily as needed for allergies.   gabapentin 600 MG tablet Commonly known as: NEURONTIN Take 1,200 mg by mouth 2 (two) times daily.   GlucoCom Blood Glucose Monitor Devi Check blood sugar as directed   glucose blood test strip Commonly known as: Accu-Chek Guide Use as instructed   HYDROcodone-acetaminophen 5-325 MG tablet Commonly known as: NORCO/VICODIN Take 1 tablet by mouth every 4 (four) hours as  needed for moderate pain.   insulin aspart 100 UNIT/ML injection Commonly known as: novoLOG Inject 35 Units into the skin 3 (three) times daily with meals. What changed:   how much to take  when to take this  additional instructions   labetalol 100 MG tablet Commonly known as: NORMODYNE Take 1 tablet (100 mg total) by mouth 2 (two) times daily.   linaclotide 145 MCG Caps capsule Commonly known as: LINZESS Take 145 mcg by mouth daily as needed (irritable bowel syndrome).   loratadine 10 MG tablet Commonly known as: CLARITIN Take 1 tablet (10 mg total) by mouth daily.   metFORMIN 1000 MG tablet Commonly known as: GLUCOPHAGE Take 1,000 mg by mouth 2 (two) times daily with a meal.   ondansetron 4 MG disintegrating tablet Commonly known as: ZOFRAN-ODT Take 1 tablet (4 mg total) by mouth every 8 (eight) hours as needed for nausea.   pantoprazole 40 MG tablet Commonly known as: PROTONIX Take 1 tablet (40 mg total) by mouth daily at 6 (six) AM.   prednisoLONE acetate 1 % ophthalmic suspension Commonly known as: PRED FORTE Place 1 drop into the left eye 4 (four) times daily.  tiZANidine 4 MG tablet Commonly known as: ZANAFLEX Take 4 mg by mouth every 8 (eight) hours as needed for muscle spasms.   Tyler Aas FlexTouch 200 UNIT/ML Sopn Generic drug: Insulin Degludec Inject 100 Units into the skin every morning.      Allergies  Allergen Reactions  . Morphine And Related Nausea And Vomiting   Follow-up Information    Newt Minion, MD Follow up in 1 week(s).   Specialty: Orthopedic Surgery Contact information: [place] [address] [phone]        Robert Bellow, PA-C Follow up in 3 day(s).   Specialty: Internal Medicine Why: Get BMP checked in 3 days Contact information: [address] [place] [id number] [phone]            The results of significant diagnostics from this hospitalization  (including imaging, microbiology, ancillary and laboratory) are listed below for reference.    Significant Diagnostic Studies: Ct Abdomen Pelvis W Contrast  Result Date: 08/27/2019 CLINICAL DATA:  38 year old female with abdominal pain. EXAM: CT ABDOMEN AND PELVIS WITH CONTRAST TECHNIQUE: Multidetector CT imaging of the abdomen and pelvis was performed using the standard protocol following bolus administration of intravenous contrast. CONTRAST:  129m OMNIPAQUE IOHEXOL 300 MG/ML  SOLN COMPARISON:  CT of the abdomen pelvis dated 09/06/2018 FINDINGS: Lower chest: The visualized lung bases are clear. No intra-abdominal free air or free fluid. Hepatobiliary: Apparent diffuse fatty infiltration of the liver. No intrahepatic biliary ductal dilatation. Cholecystectomy. Pancreas: Unremarkable. No pancreatic ductal dilatation or surrounding inflammatory changes. Spleen: Normal in size without focal abnormality. Adrenals/Urinary Tract: Adrenal glands are unremarkable. Kidneys are normal, without renal calculi, focal lesion, or hydronephrosis. Bladder is unremarkable. Stomach/Bowel: There is no bowel obstruction or active inflammation. The appendix is normal. Vascular/Lymphatic: The abdominal aorta and IVC are unremarkable. No portal venous gas. Multiple top-normal retroperitoneal lymph nodes, likely reactive. Reproductive: The uterus is anteverted, enlarged, and myomatous. There is a 6 cm left ovarian cyst. The right ovary is unremarkable. Ultrasound may provide better evaluation of pelvic structures. Other: None Musculoskeletal: No acute or significant osseous findings. IMPRESSION: 1. A 6 cm left ovarian cyst. Ultrasound may provide better evaluation of pelvic structures. 2. Fatty liver. 3. No bowel obstruction or active inflammation. Normal appendix. Electronically Signed   By: AAnner CreteM.D.   On: 08/27/2019 22:25   [place]  Result Date: 08/29/2019 CLINICAL DATA:  Acute kidney injury EXAM: RENAL / URINARY  TRACT ULTRASOUND COMPLETE COMPARISON:  None. FINDINGS: Right Kidney: Renal measurements: 13.6 x 5.5 x 5.4 cm = volume: 211 mL . Echogenicity within normal limits. No mass or hydronephrosis visualized. Left Kidney: Renal measurements: 10.1 x 5.5 x 6.2 cm = volume: 181 mL. Echogenicity within normal limits. No mass or hydronephrosis visualized. Bladder: Appears normal for degree of bladder distention. IMPRESSION: Normal renal ultrasound. Electronically Signed   By: HKathreen Devoid  On: 08/29/2019 10:00   Mr Foot Right Wo Contrast  Result Date: 08/28/2019 CLINICAL DATA:  Right foot pain and swelling. History of prior fifth toe amputation. Patient is diabetic. EXAM: MRI OF THE RIGHT FOREFOOT WITHOUT CONTRAST TECHNIQUE: Multiplanar, multisequence MR imaging of the right foot was performed. No intravenous contrast was administered. COMPARISON:  Radiographs 08/25/2019 and MRI 07/04/2018 FINDINGS: Diffuse abnormal increased T2 signal intensity in the mid distal aspect of the first metatarsal worrisome for osteomyelitis. There are also cystic changes in the metatarsal head which are likely degenerative. I do not see  any definite findings for septic arthritis at the first MTP joint and the proximal phalanx is intact. Advanced head collapse involving the second and third metatarsal heads with associated deformity. I do not see any significant surrounding fluid or marked inflammatory changes. There is some signal abnormality in the proximal phalanx of the third proximal phalanx. Chronic osteomyelitis versus avascular necrosis and head collapse. I do not see any significant changes of cellulitis. There is mild myositis involving the short flexor muscles and interosseous muscles. No findings to suggest pyomyositis. Moderate midfoot degenerative changes with patchy marrow edema in the cuboid. IMPRESSION: 1. Findings suspicious for osteomyelitis involving the first metatarsal. 2. Advanced head collapse involving the second and  third metatarsal heads. This could be chronic osteomyelitis or AVN. I would expect more surrounding inflammatory changes and fluid if this was osteomyelitis. 3. Remote fifth ray resection. 4. Mild cellulitic change and mild myositis. 5. Midfoot degenerative changes. Electronically Signed   By: Marijo Sanes M.D.   On: 08/28/2019 15:20   Vas [place] Burnard Bunting With/wo Tbi  Result Date: 08/28/2019 LOWER EXTREMITY DOPPLER STUDY Indications: Ulceration.  Vascular Interventions: 11/01/2017 - Right 5th ray amputation. Comparison Study: No prior studies. Performing Technologist: Carlos Levering RVT  Examination Guidelines: A complete evaluation includes at minimum, Doppler waveform signals and systolic blood pressure reading at the level of bilateral brachial, anterior tibial, and posterior tibial arteries, when vessel segments are accessible. Bilateral testing is considered an integral part of a complete examination. Photoelectric Plethysmograph (PPG) waveforms and toe systolic pressure readings are included as required and additional duplex testing as needed. Limited examinations for reoccurring indications may be performed as noted.  ABI Findings: +--------+------------------+-----+---------+--------+ Right   Rt Pressure (mmHg)IndexWaveform Comment  +--------+------------------+-----+---------+--------+ [id number]                                      +--------+------------------+-----+---------+--------+ PTA     212               1.07 triphasic         +--------+------------------+-----+---------+--------+ DP      226               1.14 triphasic         +--------+------------------+-----+---------+--------+ +--------+------------------+-----+---------+-------+ Left    Lt Pressure (mmHg)IndexWaveform Comment +--------+------------------+-----+---------+-------+ Brachial                                IV      +--------+------------------+-----+---------+-------+ PTA     201               1.01  triphasic        +--------+------------------+-----+---------+-------+ DP      214               1.08 triphasic        +--------+------------------+-----+---------+-------+ +-------+-----------+-----------+------------+------------+ ABI/TBIToday's ABIToday's TBIPrevious ABIPrevious TBI +-------+-----------+-----------+------------+------------+ Right  1.14                                           +-------+-----------+-----------+------------+------------+ Left   1.08                                           +-------+-----------+-----------+------------+------------+  Summary: Right: Resting right ankle-brachial index is within normal range. No evidence of significant right lower extremity arterial disease. Left: Resting left ankle-brachial index is within normal range. No evidence of significant left lower extremity arterial disease.  *See table(s) above for measurements and observations.  Electronically signed by Monica Martinez MD on 08/28/2019 at 4:33:27 PM.   Final     Microbiology: Recent Results (from the past 240 hour(s))  Blood Culture (routine x 2)     Status: None (Preliminary result)   Collection Time: 08/27/19  7:23 PM   Specimen: BLOOD LEFT ARM  Result Value Ref Range Status   Specimen Description   Final    BLOOD LEFT ARM Performed at [facility], [address], [place], [place] [address]    Special Requests   Final    BOTTLES DRAWN AEROBIC AND ANAEROBIC Blood Culture adequate volume Performed at [facility], [address] [place], [place] [address]    Culture   Final    NO GROWTH 4 DAYS Performed at [facility], [place] [address], [place], [place] [address]    Report Status PENDING  Incomplete  Blood Culture (routine x 2)     Status: None (Preliminary result)   Collection Time: 08/27/19  9:00 PM   Specimen: BLOOD  Result Value Ref Range Status   Specimen Description   Final    BLOOD BLOOD RIGHT HAND Performed at [facility], [address] [place], [place] [address]    Special Requests   Final    BOTTLES DRAWN AEROBIC AND ANAEROBIC Blood Culture adequate volume Performed at [facility], [address] [place], [place] [address]    Culture   Final    NO GROWTH 4 DAYS Performed at [facility], [place] [address], [place], [place] [address]    Report Status PENDING  Incomplete  Urine culture     Status: None   Collection Time: 08/27/19  9:00 PM   Specimen: In/Out Cath Urine  Result Value Ref Range Status   Specimen Description   Final    IN/OUT CATH URINE Performed at [facility], [address] [place], [place] [address]    Special Requests   Final    NONE Performed at [facility], [address] [place], [place] [address]    Culture   Final    NO GROWTH Performed at [facility], [place] [address] [place], [place] [address]    Report Status 08/28/2019 FINAL  Final  SARS CORONAVIRUS 2 (TAT 6-24 HRS) Nasopharyngeal Nasopharyngeal Swab     Status: None   Collection Time: 08/27/19 11:05 PM   Specimen: Nasopharyngeal Swab  Result Value Ref Range Status   SARS Coronavirus 2 NEGATIVE NEGATIVE Final    Comment: (NOTE) SARS-CoV-2 target nucleic acids are NOT DETECTED. The SARS-CoV-2 RNA is generally detectable in upper and lower respiratory specimens during the acute phase of infection. Negative results do not preclude SARS-CoV-2 infection, do not rule out co-infections with other pathogens, and should not be used as the sole basis for treatment or other patient management decisions. Negative results must be combined with clinical observations, patient history, and epidemiological information. The expected result is Negative. Fact Sheet for Patients: SugarRoll.be Fact Sheet for Healthcare Providers: https://www.woods-mathews.com/ This test is not yet approved or cleared by the [place] FDA  and  has been authorized for detection  and/or diagnosis of SARS-CoV-2 by FDA under an Emergency Use Authorization (EUA). This EUA will remain  in effect (meaning this test can be used) for the duration of the COVID-19 declaration under Section 56 4(b)(1) of the Act, 21 U.S.C. section 360bbb-3(b)(1), unless the authorization is terminated or revoked sooner. Performed at [facility], [place] [address], [place], [place] [address]      Labs: Basic Metabolic Panel: Recent Labs  Lab 08/28/19 0329 08/29/19 0343 08/29/19 0907 08/30/19 0250 08/31/19 0333  NA 134* 133* 134* 135 135  K 4.3 4.1 4.1 4.3 4.4  CL 105 102 105 108 106  CO2 19* 21* 20* 19* 22  GLUCOSE 229* 380* 300* 271* 330*  BUN 21* 35* 36* 30* 28*  CREATININE 0.96 2.06* 2.03* 1.75* 1.42*  CALCIUM 8.9 8.4* 8.8* 8.6* 8.5*   Liver Function Tests: Recent Labs  Lab 08/27/19 1922  AST 19  ALT 20  ALKPHOS 77  BILITOT 0.4  PROT 8.2*  ALBUMIN 3.7   Recent Labs  Lab 08/27/19 1922  LIPASE 31   No results for input(s): AMMONIA in the last 168 hours. CBC: Recent Labs  Lab 08/27/19 1922 08/28/19 0329 08/29/19 0343 08/30/19 0250 08/31/19 0333  WBC 6.8 6.2 6.5 6.5 5.8  HGB 10.9* 10.5* 9.0* 8.9* 8.1*  HCT 35.2* 34.6* 29.4* 29.2* 25.8*  MCV 79.6* 81.6 82.8 81.6 80.4  PLT 502* 419* 415* 366 337   Cardiac Enzymes: Recent Labs  Lab 08/29/19 0907  CKTOTAL 49    CBG: Recent Labs  Lab 08/30/19 0734 08/30/19 1125 08/30/19 1659 08/30/19 2144 08/31/19 0739  GLUCAP 274* 229* 257* 263* 310*       Signed:  Oswald Hillock MD.  [facility] 08/31/2019, 11:48 AM

## 2019-09-01 LAB — CULTURE, BLOOD (ROUTINE X 2)
Culture: NO GROWTH
Culture: NO GROWTH
Special Requests: ADEQUATE
Special Requests: ADEQUATE

## 2019-09-01 NOTE — Progress Notes (Addendum)
[facility] Note  09/07/2019     CHIEF COMPLAINT Patient presents for Retina Follow Up   HISTORY OF PRESENT ILLNESS: Lindsay Love is a 38 y.o. female who presents to the clinic today for:   HPI    Retina Follow Up    Patient presents with  Diabetic Retinopathy.  In both eyes.  This started 3 months ago.  Severity is mild.  Since onset it is stable.  I, the attending physician,  performed the HPI with the patient and updated documentation appropriately.          Comments    F/U PDR OU.Patient states "vision has gotten better", denies new visual onsets. BS 221 this am       Last edited by Bernarda Caffey, MD on 09/07/2019  1:53 PM. (History)    she states she missed her last appt due to being in the hospital with her kidneys, pt states the vision in her left eye is not as dark as it used to be  Referring physician: Marin Comment, [address] [place], [place],  [place] [id number]  HISTORICAL INFORMATION:   Selected notes from the [facility] Referred by Dr. Maryjane Hurter for concern of diabetic retinopathy LEE: 07.02.20 (M. Le) [BCVA: OD: 20/40 OS: LP] Ocular Hx-retinal traction, CME OS, optic nerve edema OS PMH-DM (A1c: 13.0 (12.02.18), takes tresiba/novolog), ashthma   CURRENT MEDICATIONS: Current Outpatient Medications (Ophthalmic Drugs)  Medication Sig  . brimonidine (ALPHAGAN) 0.2 % ophthalmic solution Place 1 drop into the left eye 3 (three) times daily.  . dorzolamide-timolol (COSOPT) 22.3-6.8 MG/ML ophthalmic solution Place 1 drop into the left eye 2 (two) times daily.  . prednisoLONE acetate (PRED FORTE) 1 % ophthalmic suspension Place 1 drop into the left eye 4 (four) times daily.   No current facility-administered medications for this visit.  (Ophthalmic Drugs)   Current Outpatient Medications (Other)  Medication Sig  . ACCU-CHEK FASTCLIX LANCETS MISC USE TO TEST BLOOD SUGAR 5 TIMES DAILY  . albuterol (PROVENTIL HFA;VENTOLIN HFA)  108 (90 Base) MCG/ACT inhaler Inhale 2 puffs into the lungs every 6 (six) hours as needed for wheezing or shortness of breath.  . blood glucose meter kit and supplies Dispense based on patient and insurance preference. Use up to four times daily as directed. (FOR ICD-9 250.00, 250.01).  . Blood Glucose Monitoring Suppl (GLUCOCOM BLOOD GLUCOSE MONITOR) DEVI Check blood sugar as directed  . cephALEXin (KEFLEX) 500 MG capsule Take 1 capsule (500 mg total) by mouth 2 (two) times daily.  . fluticasone (FLONASE) 50 MCG/ACT nasal spray Place 1 spray into both nostrils daily as needed for allergies.   Marland Kitchen gabapentin (NEURONTIN) 600 MG tablet Take 1,200 mg by mouth 2 (two) times daily.  Marland Kitchen glucose blood (ACCU-CHEK GUIDE) test strip Use as instructed  . HYDROcodone-acetaminophen (NORCO/VICODIN) 5-325 MG tablet Take 1 tablet by mouth every 4 (four) hours as needed for moderate pain.  Marland Kitchen insulin aspart (NOVOLOG) 100 UNIT/ML injection Inject 35 Units into the skin 3 (three) times daily with meals. (Patient taking differently: Inject 25-50 Units into the skin See admin instructions. 3-4 times daily.  "Sliding scale plus 20 units.")  . Insulin Degludec (TRESIBA FLEXTOUCH) 200 UNIT/ML SOPN Inject 100 Units into the skin every morning.  . Insulin Pen Needle (FIFTY50 PEN NEEDLES) 31G X 5 MM MISC USE ONE NEEDLE WITH EACH INSULIN INJECTION 6 TIMES PER DAY. E10.65  . labetalol (NORMODYNE) 100 MG tablet Take 1  tablet (100 mg total) by mouth 2 (two) times daily.  Marland Kitchen linaclotide (LINZESS) 145 MCG CAPS capsule Take 145 mcg by mouth daily as needed (irritable bowel syndrome).  Marland Kitchen loratadine (CLARITIN) 10 MG tablet Take 1 tablet (10 mg total) by mouth daily.  . metFORMIN (GLUCOPHAGE) 1000 MG tablet Take 1,000 mg by mouth 2 (two) times daily with a meal.  . ondansetron (ZOFRAN-ODT) 4 MG disintegrating tablet Take 1 tablet (4 mg total) by mouth every 8 (eight) hours as needed for nausea.  . pantoprazole (PROTONIX) 40 MG tablet Take 1  tablet (40 mg total) by mouth daily at 6 (six) AM.  . tiZANidine (ZANAFLEX) 4 MG tablet Take 4 mg by mouth every 8 (eight) hours as needed for muscle spasms.    No current facility-administered medications for this visit.  (Other)      REVIEW OF SYSTEMS: ROS    Positive for: Endocrine, Eyes   Negative for: Constitutional, Gastrointestinal, Neurological, Skin, Genitourinary, Musculoskeletal, HENT, Cardiovascular, Respiratory, Psychiatric, Allergic/Imm, Heme/Lymph   Last edited by Zenovia Jordan, LPN on 67/59/1638  4:66 PM. (History)       ALLERGIES Allergies  Allergen Reactions  . Morphine And Related Nausea And Vomiting    PAST MEDICAL HISTORY Past Medical History:  Diagnosis Date  . Asthma   . Cellulitis of left foot   . Diabetes mellitus without complication ([place])   . Diabetic retinopathy ([place])    PDR OU  . GERD (gastroesophageal reflux disease) 10/27/2017  . H/O seasonal allergies   . Hypertension   . Hypertensive retinopathy    OU  . IBS (irritable bowel syndrome)   . Proliferative diabetic retinopathy ([place])    with vitreous hemorrhage and tractional retinal detachment, left eye  . RLS (restless legs syndrome) 10/27/2017  . Wears glasses    Past Surgical History:  Procedure Laterality Date  . AMPUTATION Right 11/01/2017   Procedure: RIGHT FOOT 5TH RAY AMPUTATION;  Surgeon: Newt Minion, MD;  Location: [facility];  Service: Orthopedics;  Laterality: Right;  . CESAREAN SECTION    . CHOLECYSTECTOMY    . CYST EXCISION    . INJECTION OF SILICONE OIL Left 5/99/3570   Procedure: Injection Of Silicone Oil;  Surgeon: Bernarda Caffey, MD;  Location: [facility];  Service: Ophthalmology;  Laterality: Left;  . MEMBRANE PEEL Left 07/23/2019   Procedure: Antoine Primas;  Surgeon: Bernarda Caffey, MD;  Location: [facility];  Service: Ophthalmology;  Laterality: Left;  . PARS PLANA VITRECTOMY Left 07/23/2019   Procedure: [place] Vitrectomy With 25 Gauge;  Surgeon: Bernarda Caffey, MD;  Location:  [facility];  Service: Ophthalmology;  Laterality: Left;  . PHOTOCOAGULATION WITH LASER Left 07/23/2019   Procedure: Photocoagulation With Laser;  Surgeon: Bernarda Caffey, MD;  Location: [facility];  Service: Ophthalmology;  Laterality: Left;  . REPAIR OF COMPLEX TRACTION RETINAL DETACHMENT Left 07/23/2019   Procedure: REPAIR OF COMPLEX TRACTION RETINAL DETACHMENT;  Surgeon: Bernarda Caffey, MD;  Location: [facility];  Service: Ophthalmology;  Laterality: Left;  . TONSILLECTOMY    . TOOTH EXTRACTION      FAMILY HISTORY Family History  Problem Relation Age of Onset  . Diabetes Mother   . Cancer Mother   . Irritable bowel syndrome Mother   . Hypertension Mother   . Sleep apnea Mother   . Diabetes Father   . Restless legs syndrome Father     SOCIAL HISTORY Social History   Tobacco Use  . Smoking status: Never Smoker  . Smokeless tobacco:  Never Used  Substance Use Topics  . Alcohol use: No  . Drug use: No         OPHTHALMIC EXAM:  Base Eye Exam    Visual Acuity (Snellen - Linear)      Right Left   Dist cc 20/30 +1 CF at 3'   Dist ph cc 20/25    Correction: Glasses       Tonometry (Tonopen, 1:17 PM)      Right Left   Pressure 12 16       Pupils      Dark Light Shape React APD   Right 3 2 Round Brisk None   Left 3 2 Round Brisk None       Visual Fields      Left Right    Full Full       Extraocular Movement      Right Left    Full, Ortho Full, Ortho       Neuro/Psych    Oriented x3: Yes   Mood/Affect: Normal       Dilation    Both eyes: 1.0% Mydriacyl, 2.5% Phenylephrine @ 1:17 PM        Slit Lamp and Fundus Exam    Slit Lamp Exam      Right Left   Lids/Lashes Dermatochalasis - upper lid, mild Meibomian gland dysfunction Dermatochalasis - upper lid, mild Meibomian gland dysfunction, periorbital edema   Conjunctiva/Sclera White and quiet White and quiet, sutures almost dissolved   Cornea trace Punctate epithelial erosions trace  PEE   Anterior Chamber deep,  narrow temporal angle Deep and quiet   Iris Round and moderately dilated to 11m, No NVI Round and moderately dilated to 6.561m No NVI   Lens Clear Trace Cortical cataract   Vitreous mild Vitreous syneresis post vitrectomy; good silicon oil fill       Fundus Exam      Right Left   Disc Pink and Sharp, +fine NVD temporally and superiorly - regressed improved fibrosis, mild Pallor, Sharp rim   C/D Ratio 0.2    Macula Blunted foveal reflex, +central cystic changes, +ERM, scattered MA/IRH, +NVE along ST arcades, new pre-retinal / sub-hyaloid heme centrally residual SRF - slightly improved from prior, mild ERM along ST arcade, mild residual fibrosis improving   Vessels Vascular attenuation, Tortuous, +NV, +venus beading IN arcades, fine NVE along ST arcades Attenuated; tortuous; improved fibrosis   Periphery Attached, scattered DBH/CWS nasally  Persistent SRF / shallow RD posteriorly under oil; good 360 PRP; improved fibrosis          IMAGING AND PROCEDURES  Imaging and Procedures for _0 @  OCT, Retina - OU - Both Eyes       Right Eye Quality was good. Central Foveal Thickness: 332. Progression has been stable. Findings include no SRF, vitreomacular adhesion , epiretinal membrane, abnormal foveal contour, no IRF (mild interval improvement in perifoveal sub-hyaloid heme, mild, non-central cystic changes - persistent).   Left Eye Quality was good. Central Foveal Thickness: 858. Progression has improved. Findings include epiretinal membrane, subretinal hyper-reflective material, intraretinal fluid, subretinal fluid, abnormal foveal contour (Mild interval improvement in IRF/ SRF under oil; +ERM greatest along ST arcades).   Notes *Images captured and stored on drive  Diagnosis / Impression:  OD: abnormal foveal contour; mild interval improvement in perifoveal sub-hyaloid heme, mild, non-central cystic changes - persistent OS: preretinal fibrosis and tractional membranes improved; Mild  interval improvement in IRF/SRF under oil; +ERM greatest along ST arcades  Clinical management:  See below  Abbreviations: NFP - Normal foveal profile. CME - cystoid macular edema. PED - pigment epithelial detachment. IRF - intraretinal fluid. SRF - subretinal fluid. EZ - ellipsoid zone. ERM - epiretinal membrane. ORA - outer retinal atrophy. ORT - outer retinal tubulation. SRHM - subretinal hyper-reflective material                 ASSESSMENT/PLAN:    ICD-10-CM   1. Proliferative diabetic retinopathy of both eyes with macular edema associated with type 2 diabetes mellitus ([place])  J19.4174   2. Retinal edema  H35.81 OCT, Retina - OU - Both Eyes  3. Vitreous hemorrhage of left eye (HCC)  H43.12   4. 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 Essential hypertension  I10   6. Hypertensive retinopathy of both eyes  H35.033     1-4. Proliferative diabetic retinopathy with macular edema OU (OS > OD)  - OS with acute decrease in vision from vitreous/subhyaloid heme  - initial exam showed large preretinal/subhyaloid heme OS and scattered fibrosis w/ NV; OD with scattered intraretinal DBH and flat NV  - FA (07.06.20) shows NV and capillary nonperfusion OU  - s/p IVA OS (07.06.20), #2 (08.03.20)  - s/p IVA OD #1 (08.03.20)  - s/p PRP OS (07.06.20)  - s/p PRP OD (08.03.20)  - OS with progressive increase in VH and tractional fibrosis pre op  - s/p PPV/MP/EL/silicone oil + IVA OS 08.14.4818             - good silicone oil bubble in place  - tractional membranes and fibrosis improved, but shallow residual SRF persists posteriorly -- interval improvement from prior  - discussed findings -- resolution of SRF likely impacted by ERM and other systemic illnesses             - IOP 16 OS  - pt taking all drops twice a day - okay to continue             - decrease PF to BID OS   - decrease Brimonidine to BID OS  - cont Cosopt BID OS             - PSO ung OS -- bedtime / PRN             -  cont face down positioning 50% of time; avoid laying flat on back              - post op drop and positioning instructions reviewed              - tylenol/ibuprofen for pain  - f/u 4 weeks -- may need repeat PPV if SRF fails to improve   5,6. Hypertensive retinopathy OU  - discussed importance of tight BP control  - monitor    Ophthalmic Meds Ordered this visit:  No orders of the defined types were placed in this encounter.      Return in about 4 weeks (around 10/05/2019) for f/u PDR OU, DFE, OCT.  There are no Patient Instructions on file for this visit.   Explained the diagnoses, plan, and follow up with the patient and they expressed understanding.  Patient expressed understanding of the importance of proper follow up care.    Electronically signed by: Leeann Must, [place] 09/01/2019 10:27AM  This document serves as a record of services personally performed by Gardiner Sleeper, MD, PhD. It was created on their behalf by Ernest Mallick, OA, an ophthalmic  assistant. The creation of this record is the provider's dictation and/or activities during the visit.    Electronically signed by: Ernest Mallick, OA 10.12.2020 2:29 PM   Gardiner Sleeper, M.D., Ph.D. Diseases & Surgery of the Retina and Vitreous [facility]  I have reviewed the above documentation for accuracy and completeness, and I agree with the above. Gardiner Sleeper, M.D., Ph.D. 09/07/19 2:29 PM   Abbreviations: M myopia (nearsighted); A astigmatism; H hyperopia (farsighted); P presbyopia; Mrx spectacle prescription;  CTL contact lenses; OD right eye; OS left eye; OU both eyes  XT exotropia; ET esotropia; PEK punctate epithelial keratitis; PEE punctate epithelial erosions; DES dry eye syndrome; MGD meibomian gland dysfunction; ATs artificial tears; PFAT's preservative free artificial tears; [place] nuclear sclerotic cataract; PSC posterior subcapsular cataract; ERM epi-retinal membrane; PVD posterior  vitreous detachment; RD retinal detachment; DM diabetes mellitus; DR diabetic retinopathy; NPDR non-proliferative diabetic retinopathy; PDR proliferative diabetic retinopathy; CSME clinically significant macular edema; DME diabetic macular edema; dbh dot blot hemorrhages; CWS cotton wool spot; POAG primary open angle glaucoma; C/D cup-to-disc ratio; HVF humphrey visual field; GVF goldmann visual field; OCT optical coherence tomography; IOP intraocular pressure; BRVO Branch retinal vein occlusion; CRVO central retinal vein occlusion; CRAO central retinal artery occlusion; BRAO branch retinal artery occlusion; RT retinal tear; SB scleral buckle; PPV pars plana vitrectomy; VH Vitreous hemorrhage; PRP panretinal laser photocoagulation; IVK intravitreal kenalog; VMT vitreomacular traction; MH Macular hole;  NVD neovascularization of the disc; NVE neovascularization elsewhere; AREDS age related eye disease study; ARMD age related macular degeneration; POAG primary open angle glaucoma; EBMD epithelial/anterior basement membrane dystrophy; ACIOL anterior chamber intraocular lens; IOL intraocular lens; PCIOL posterior chamber intraocular lens; Phaco/IOL phacoemulsification with intraocular lens placement; [place] photorefractive keratectomy; LASIK laser assisted in situ keratomileusis; HTN hypertension; DM diabetes mellitus; COPD chronic obstructive pulmonary disease

## 2019-09-07 ENCOUNTER — Ambulatory Visit (INDEPENDENT_AMBULATORY_CARE_PROVIDER_SITE_OTHER): Payer: Medicaid Other | Admitting: Ophthalmology

## 2019-09-07 ENCOUNTER — Encounter (INDEPENDENT_AMBULATORY_CARE_PROVIDER_SITE_OTHER): Payer: Self-pay | Admitting: Ophthalmology

## 2019-09-07 DIAGNOSIS — H3342 Traction detachment of retina, left eye: Secondary | ICD-10-CM

## 2019-09-07 DIAGNOSIS — H4312 Vitreous hemorrhage, left eye: Secondary | ICD-10-CM

## 2019-09-07 DIAGNOSIS — H3581 Retinal edema: Secondary | ICD-10-CM | POA: Diagnosis not present

## 2019-09-07 DIAGNOSIS — H35033 Hypertensive retinopathy, bilateral: Secondary | ICD-10-CM

## 2019-09-07 DIAGNOSIS — E113513 Type 2 diabetes mellitus with proliferative diabetic retinopathy with macular edema, bilateral: Secondary | ICD-10-CM

## 2019-09-07 DIAGNOSIS — I10 Essential (primary) hypertension: Secondary | ICD-10-CM

## 2019-10-05 ENCOUNTER — Encounter (INDEPENDENT_AMBULATORY_CARE_PROVIDER_SITE_OTHER): Payer: Medicaid Other | Admitting: Ophthalmology

## 2019-10-05 NOTE — Progress Notes (Signed)
[facility] Note  10/06/2019     CHIEF COMPLAINT Patient presents for Retina Follow Up   HISTORY OF PRESENT ILLNESS: Lindsay Love is a 38 y.o. female who presents to the clinic today for:   HPI    Retina Follow Up    Patient presents with  Diabetic Retinopathy.  In both eyes.  Severity is moderate.  Duration of 4 weeks.  Since onset it is stable.  I, the attending physician,  performed the HPI with the patient and updated documentation appropriately.          Comments    Patient states vision still blurry OS. No change since visit on 10.12.20. Using PF bid OS, brimonidine and cosopt bid OS. Using [facility] ung bedtime/prn. BS was 130 last night. Last a1c was slightly over 8.        Last edited by Bernarda Caffey, MD on 10/10/2019  3:24 PM. (History)    she states she doesn't feel like her vision is getting any better   Referring physician: Marin [address] [place], [place],  [place] [id number]  HISTORICAL INFORMATION:   Selected notes from the MEDICAL RECORD NUMBER Referred by Dr. Maryjane Hurter for concern of diabetic retinopathy   CURRENT MEDICATIONS: Current Outpatient Medications (Ophthalmic Drugs)  Medication Sig  . brimonidine (ALPHAGAN) 0.2 % ophthalmic solution Place 1 drop into the left eye 3 (three) times daily.  . dorzolamide-timolol (COSOPT) 22.3-6.8 MG/ML ophthalmic solution Place 1 drop into the left eye 2 (two) times daily.  . prednisoLONE acetate (PRED FORTE) 1 % ophthalmic suspension Place 1 drop into the left eye 4 (four) times daily. (Patient taking differently: Place 1 drop into the left eye 2 (two) times daily. )   No current facility-administered medications for this visit.  (Ophthalmic Drugs)   Current Outpatient Medications (Other)  Medication Sig  . ACCU-CHEK FASTCLIX LANCETS MISC USE TO TEST BLOOD SUGAR 5 TIMES DAILY  . albuterol (PROVENTIL HFA;VENTOLIN HFA) 108 (90 Base) MCG/ACT inhaler Inhale 2 puffs into the lungs  every 6 (six) hours as needed for wheezing or shortness of breath.  . blood glucose meter kit and supplies Dispense based on patient and insurance preference. Use up to four times daily as directed. (FOR ICD-9 250.00, 250.01).  . Blood Glucose Monitoring Suppl (GLUCOCOM BLOOD GLUCOSE MONITOR) DEVI Check blood sugar as directed  . cephALEXin (KEFLEX) 500 MG capsule Take 1 capsule (500 mg total) by mouth 2 (two) times daily.  . fluticasone (FLONASE) 50 MCG/ACT nasal spray Place 1 spray into both nostrils daily as needed for allergies.   Marland Kitchen gabapentin (NEURONTIN) 600 MG tablet Take 1,200 mg by mouth 2 (two) times daily.  Marland Kitchen glucose blood (ACCU-CHEK GUIDE) test strip Use as instructed  . HYDROcodone-acetaminophen (NORCO/VICODIN) 5-325 MG tablet Take 1 tablet by mouth every 4 (four) hours as needed for moderate pain.  Marland Kitchen insulin aspart (NOVOLOG) 100 UNIT/ML injection Inject 35 Units into the skin 3 (three) times daily with meals. (Patient taking differently: Inject 25-50 Units into the skin See admin instructions. 3-4 times daily.  "Sliding scale plus 20 units.")  . Insulin Degludec (TRESIBA FLEXTOUCH) 200 UNIT/ML SOPN Inject 100 Units into the skin every morning.  . Insulin Pen Needle (FIFTY50 PEN NEEDLES) 31G X 5 MM MISC USE ONE NEEDLE WITH EACH INSULIN INJECTION 6 TIMES PER DAY. E10.65  . labetalol (NORMODYNE) 100 MG tablet Take 1 tablet (100 mg total) by mouth 2 (two) times daily.  Marland Kitchen  linaclotide (LINZESS) 145 MCG CAPS capsule Take 145 mcg by mouth daily as needed (irritable bowel syndrome).  Marland Kitchen loratadine (CLARITIN) 10 MG tablet Take 1 tablet (10 mg total) by mouth daily.  . metFORMIN (GLUCOPHAGE) 1000 MG tablet Take 1,000 mg by mouth 2 (two) times daily with a meal.  . ondansetron (ZOFRAN-ODT) 4 MG disintegrating tablet Take 1 tablet (4 mg total) by mouth every 8 (eight) hours as needed for nausea.  . pantoprazole (PROTONIX) 40 MG tablet Take 1 tablet (40 mg total) by mouth daily at 6 (six) AM.  .  tiZANidine (ZANAFLEX) 4 MG tablet Take 4 mg by mouth every 8 (eight) hours as needed for muscle spasms.    No current facility-administered medications for this visit.  (Other)      REVIEW OF SYSTEMS: ROS    Positive for: Endocrine, Eyes   Negative for: Constitutional, Gastrointestinal, Neurological, Skin, Genitourinary, Musculoskeletal, HENT, Cardiovascular, Respiratory, Psychiatric, Allergic/Imm, Heme/Lymph   Last edited by Roselee Nova D, COT on 10/06/2019  1:36 PM. (History)       ALLERGIES Allergies  Allergen Reactions  . Morphine And Related Nausea And Vomiting    PAST MEDICAL HISTORY Past Medical History:  Diagnosis Date  . Asthma   . Cellulitis of left foot   . Diabetes mellitus without complication ([place])   . Diabetic retinopathy ([place])    PDR OU  . GERD (gastroesophageal reflux disease) 10/27/2017  . H/O seasonal allergies   . Hypertension   . Hypertensive retinopathy    OU  . IBS (irritable bowel syndrome)   . Proliferative diabetic retinopathy ([place])    with vitreous hemorrhage and tractional retinal detachment, left eye  . RLS (restless legs syndrome) 10/27/2017  . Wears glasses    Past Surgical History:  Procedure Laterality Date  . AMPUTATION Right 11/01/2017   Procedure: RIGHT FOOT 5TH RAY AMPUTATION;  Surgeon: Newt Minion, MD;  Location: [place];  Service: Orthopedics;  Laterality: Right;  . CESAREAN SECTION    . CHOLECYSTECTOMY    . CYST EXCISION    . EYE SURGERY Left 16/08/9603   PPV, silicone oil  . INJECTION OF SILICONE OIL Left 5/40/9811   Procedure: Injection Of Silicone Oil;  Surgeon: Bernarda Caffey, MD;  Location: [place];  Service: Ophthalmology;  Laterality: Left;  . MEMBRANE PEEL Left 07/23/2019   Procedure: Antoine Primas;  Surgeon: Bernarda Caffey, MD;  Location: [place];  Service: Ophthalmology;  Laterality: Left;  . PARS PLANA VITRECTOMY Left 07/23/2019   Procedure: [place] Vitrectomy With 25 Gauge;  Surgeon: Bernarda Caffey, MD;  Location: [place];  Service: Ophthalmology;  Laterality: Left;  . PHOTOCOAGULATION WITH LASER Left 07/23/2019   Procedure: Photocoagulation With Laser;  Surgeon: Bernarda Caffey, MD;  Location: [place];  Service: Ophthalmology;  Laterality: Left;  . REPAIR OF COMPLEX TRACTION RETINAL DETACHMENT Left 07/23/2019   Procedure: REPAIR OF COMPLEX TRACTION RETINAL DETACHMENT;  Surgeon: Bernarda Caffey, MD;  Location: [place];  Service: Ophthalmology;  Laterality: Left;  . TONSILLECTOMY    . TOOTH EXTRACTION      FAMILY HISTORY Family History  Problem Relation Age of Onset  . Diabetes Mother   . Cancer Mother   . Irritable bowel syndrome Mother   . Hypertension Mother   . Sleep apnea Mother   . Diabetes Father   . Restless legs syndrome Father     SOCIAL HISTORY Social History   Tobacco Use  . Smoking status: Never Smoker  . Smokeless tobacco:  Never Used  Substance Use Topics  . Alcohol use: No  . Drug use: No         OPHTHALMIC EXAM:  Base Eye Exam    Visual Acuity (Snellen - Linear)      Right Left   Dist cc 20/25 CF at 1'   Dist ph cc NI NI   Correction: Glasses       Tonometry (Tonopen, 1:48 PM)      Right Left   Pressure 17 26       Pupils      Dark Light Shape React APD   Right 4 3.5 Round Minimal None   Left 6 5.5 Round Minimal None       Visual Fields (Counting fingers)      Left Right     Full   Restrictions Total inferior temporal, superior nasal, inferior nasal deficiencies        Extraocular Movement      Right Left    Full, Ortho Full, Ortho       Neuro/Psych    Oriented x3: Yes   Mood/Affect: Normal       Dilation    Both eyes: 1.0% Mydriacyl, 2.5% Phenylephrine @ 1:49 PM        Slit Lamp and Fundus Exam    Slit Lamp Exam      Right Left   Lids/Lashes Dermatochalasis - upper lid, mild Meibomian gland dysfunction Dermatochalasis - upper lid, mild Meibomian gland dysfunction, periorbital edema   Conjunctiva/Sclera White and quiet White and quiet, sutures  almost dissolved   Cornea trace Punctate epithelial erosions trace  PEE   Anterior Chamber Deep and clear, narrow temporal angle Deep and quiet   Iris Round and moderately dilated to 63m, No NVI Round and moderately dilated to 6.538m No NVI   Lens Clear Trace Cortical cataract   Vitreous mild Vitreous syneresis post vitrectomy; good silicon oil fill       Fundus Exam      Right Left   Disc Pink and Sharp, +fine NVD temporally and superiorly - regressed improved fibrosis, mild Pallor, Sharp rim   C/D Ratio 0.2    Macula Blunted foveal reflex, +central cystic changes, +ERM, scattered MA/IRH, +NVE along ST arcades, new pre-retinal / sub-hyaloid heme centrally residual SRF - slightly improved from prior, mild ERM along ST arcade, mild residual fibrosis improving   Vessels Vascular attenuation, Tortuous, +NV, +venus beading IN arcades, fine NVE along ST arcades Attenuated; tortuous; improved fibrosis   Periphery Attached, scattered DBH/CWS nasally  Persistent SRF / shallow RD posteriorly under oil; good 360 PRP; improved fibrosis, scattered IRH mostly around arcades          IMAGING AND PROCEDURES  Imaging and Procedures for '@TODAY' @  OCT, Retina - OU - Both Eyes       Right Eye Quality was good. Central Foveal Thickness: 355. Progression has improved. Findings include no SRF, vitreomacular adhesion , epiretinal membrane, abnormal foveal contour, no IRF (mild interval improvement in perifoveal sub-hyaloid heme, mild, non-central cystic changes - persistent, +disc edema).   Left Eye Quality was good. Central Foveal Thickness: 775. Progression has improved. Findings include epiretinal membrane, subretinal hyper-reflective material, intraretinal fluid, subretinal fluid, abnormal foveal contour (Mild interval improvement in SRF under oil; +ERM greatest along ST arcades).   Notes *Images captured and stored on drive  Diagnosis / Impression:  OD: abnormal foveal contour; mild interval  improvement in perifoveal sub-hyaloid heme, mild, non-central cystic changes - persistent; +  disc edema OS: preretinal fibrosis and tractional membranes improved; Mild interval improvement in SRF under oil; +ERM greatest along ST arcades  Clinical management:  See below  Abbreviations: NFP - Normal foveal profile. CME - cystoid macular edema. PED - pigment epithelial detachment. IRF - intraretinal fluid. SRF - subretinal fluid. EZ - ellipsoid zone. ERM - epiretinal membrane. ORA - outer retinal atrophy. ORT - outer retinal tubulation. SRHM - subretinal hyper-reflective material                 ASSESSMENT/PLAN:    ICD-10-CM   1. Proliferative diabetic retinopathy of both eyes with macular edema associated with type 2 diabetes mellitus ([place])  K16.0109   2. Retinal edema  H35.81 OCT, Retina - OU - Both Eyes  3. Vitreous hemorrhage of left eye (HCC)  H43.12   4. 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 Essential hypertension  I10   6. Hypertensive retinopathy of both eyes  H35.033     1-4. Proliferative diabetic retinopathy with macular edema OU (OS > OD)  - OS with acute decrease in vision from vitreous/subhyaloid heme  - initial exam showed large preretinal/subhyaloid heme OS and scattered fibrosis w/ NV; OD with scattered intraretinal DBH and flat NV  - FA (07.06.20) shows NV and capillary nonperfusion OU  - s/p IVA OS (07.06.20), #2 (08.03.20)  - s/p IVA OD #1 (08.03.20)  - s/p PRP OS (07.06.20)  - s/p PRP OD (08.03.20)  - OS with progressive increase in VH and tractional fibrosis pre op  - s/p PPV/MP/EL/silicone oil + IVA OS [id number]             - good silicone oil bubble in place  - tractional membranes and fibrosis improved, but shallow residual SRF persists posteriorly -- interval improvement from prior  - discussed findings -- resolution of SRF likely impacted by ERM and other systemic illnesses             - IOP elevated at 26 OS             - taper PF to QD x7  days, then stop  - Brimonidine BID OS - increase to TID   - Cosopt BID OS - increase to TID             - PSO ung OS -- bedtime / PRN             - cont face down positioning 50% of time; avoid laying flat on back              - post op drop and positioning instructions reviewed              - tylenol/ibuprofen for pain  - f/u 2 weeks -- may need repeat PPV if SRF fails to improve   5,6. Hypertensive retinopathy OU  - discussed importance of tight BP control  - monitor    Ophthalmic Meds Ordered this visit:  Meds ordered this encounter  Medications  . brimonidine (ALPHAGAN) 0.2 % ophthalmic solution    Sig: Place 1 drop into the left eye 3 (three) times daily.    Dispense:  10 mL    Refill:  4       Return in about 2 weeks (around 10/20/2019) for f/u IOP check.  There are no Patient Instructions on file for this visit.   Explained the diagnoses, plan, and follow up with the patient and they expressed understanding.  Patient expressed  understanding of the importance of proper follow up care.    This document serves as a record of services personally performed by Gardiner Sleeper, MD, PhD. It was created on their behalf by Estill Bakes, COT an ophthalmic technician. The creation of this record is the provider's dictation and/or activities during the visit.    Electronically signed by: Estill Bakes, COT 10/05/19 @ 3:27 PM   This document serves as a record of services personally performed by Gardiner Sleeper, MD, PhD. It was created on their behalf by Ernest Mallick, OA, an ophthalmic assistant. The creation of this record is the provider's dictation and/or activities during the visit.    Electronically signed by: Ernest Mallick, OA 11.10.2020 3:27 PM  Gardiner Sleeper, M.D., Ph.D. Diseases & Surgery of the Retina and Vitreous [facility]   I have reviewed the above documentation for accuracy and completeness, and I agree with the above. Gardiner Sleeper,  M.D., Ph.D. 10/10/19 3:27 PM    Abbreviations: M myopia (nearsighted); A astigmatism; H hyperopia (farsighted); P presbyopia; Mrx spectacle prescription;  CTL contact lenses; OD right eye; OS left eye; OU both eyes  XT exotropia; ET esotropia; PEK punctate epithelial keratitis; PEE punctate epithelial erosions; DES dry eye syndrome; MGD meibomian gland dysfunction; ATs artificial tears; PFAT's preservative free artificial tears; [place] nuclear sclerotic cataract; PSC posterior subcapsular cataract; ERM epi-retinal membrane; PVD posterior vitreous detachment; RD retinal detachment; DM diabetes mellitus; DR diabetic retinopathy; NPDR non-proliferative diabetic retinopathy; PDR proliferative diabetic retinopathy; CSME clinically significant macular edema; DME diabetic macular edema; dbh dot blot hemorrhages; CWS cotton wool spot; POAG primary open angle glaucoma; C/D cup-to-disc ratio; HVF humphrey visual field; GVF goldmann visual field; OCT optical coherence tomography; IOP intraocular pressure; BRVO Branch retinal vein occlusion; CRVO central retinal vein occlusion; CRAO central retinal artery occlusion; BRAO branch retinal artery occlusion; RT retinal tear; SB scleral buckle; PPV pars plana vitrectomy; VH Vitreous hemorrhage; PRP panretinal laser photocoagulation; IVK intravitreal kenalog; VMT vitreomacular traction; MH Macular hole;  NVD neovascularization of the disc; NVE neovascularization elsewhere; AREDS age related eye disease study; ARMD age related macular degeneration; POAG primary open angle glaucoma; EBMD epithelial/anterior basement membrane dystrophy; ACIOL anterior chamber intraocular lens; IOL intraocular lens; PCIOL posterior chamber intraocular lens; Phaco/IOL phacoemulsification with intraocular lens placement; [place] photorefractive keratectomy; LASIK laser assisted in situ keratomileusis; HTN hypertension; DM diabetes mellitus; COPD chronic obstructive pulmonary disease

## 2019-10-06 ENCOUNTER — Encounter (INDEPENDENT_AMBULATORY_CARE_PROVIDER_SITE_OTHER): Payer: Self-pay | Admitting: Ophthalmology

## 2019-10-06 ENCOUNTER — Ambulatory Visit (INDEPENDENT_AMBULATORY_CARE_PROVIDER_SITE_OTHER): Payer: Medicaid Other | Admitting: Ophthalmology

## 2019-10-06 DIAGNOSIS — H3342 Traction detachment of retina, left eye: Secondary | ICD-10-CM

## 2019-10-06 DIAGNOSIS — I10 Essential (primary) hypertension: Secondary | ICD-10-CM

## 2019-10-06 DIAGNOSIS — H4312 Vitreous hemorrhage, left eye: Secondary | ICD-10-CM

## 2019-10-06 DIAGNOSIS — H35033 Hypertensive retinopathy, bilateral: Secondary | ICD-10-CM

## 2019-10-06 DIAGNOSIS — H3581 Retinal edema: Secondary | ICD-10-CM | POA: Diagnosis not present

## 2019-10-06 DIAGNOSIS — E113513 Type 2 diabetes mellitus with proliferative diabetic retinopathy with macular edema, bilateral: Secondary | ICD-10-CM

## 2019-10-06 MED ORDER — BRIMONIDINE TARTRATE 0.2 % OP SOLN: 1.0000 [drp] | Freq: Three times a day (TID) | OPHTHALMIC | 4 refills | Status: AC

## 2019-10-10 ENCOUNTER — Encounter (INDEPENDENT_AMBULATORY_CARE_PROVIDER_SITE_OTHER): Payer: Self-pay | Admitting: Ophthalmology

## 2019-10-20 ENCOUNTER — Encounter (INDEPENDENT_AMBULATORY_CARE_PROVIDER_SITE_OTHER): Payer: Medicaid Other | Admitting: Ophthalmology

## 2019-10-27 ENCOUNTER — Encounter (INDEPENDENT_AMBULATORY_CARE_PROVIDER_SITE_OTHER): Payer: Medicaid Other | Admitting: Ophthalmology

## 2019-11-06 ENCOUNTER — Other Ambulatory Visit: Payer: Self-pay

## 2019-11-06 ENCOUNTER — Ambulatory Visit (INDEPENDENT_AMBULATORY_CARE_PROVIDER_SITE_OTHER): Payer: Medicaid Other | Admitting: Ophthalmology

## 2019-11-06 DIAGNOSIS — H3342 Traction detachment of retina, left eye: Secondary | ICD-10-CM

## 2019-11-06 DIAGNOSIS — H3581 Retinal edema: Secondary | ICD-10-CM

## 2019-11-06 DIAGNOSIS — H4312 Vitreous hemorrhage, left eye: Secondary | ICD-10-CM | POA: Diagnosis not present

## 2019-11-06 DIAGNOSIS — H35033 Hypertensive retinopathy, bilateral: Secondary | ICD-10-CM

## 2019-11-06 DIAGNOSIS — E113513 Type 2 diabetes mellitus with proliferative diabetic retinopathy with macular edema, bilateral: Secondary | ICD-10-CM

## 2019-11-06 DIAGNOSIS — I10 Essential (primary) hypertension: Secondary | ICD-10-CM

## 2019-11-06 NOTE — Progress Notes (Signed)
[facility] Note  11/06/2019     CHIEF COMPLAINT Patient presents for Retina Follow Up   HISTORY OF PRESENT ILLNESS: Lindsay Love is a 38 y.o. female who presents to the clinic today for:   HPI    Retina Follow Up    Patient presents with  Diabetic Retinopathy.  In left eye.  This started weeks ago.  Severity is moderate.  Duration of weeks.  Since onset it is stable.  I, the attending physician,  performed the HPI with the patient and updated documentation appropriately.          Comments    Pt states her vision is the same OU.  Patient complains of swelling under left lower lid.  Patient denies eye pain.       Last edited by Bernarda Caffey, MD on 11/08/2019  7:48 PM. (History)    pt states her vision is not getting any better, she states her left eye has been swollen for about 2 weeks, she states she has slept on her left side a few times, but she tries to keep her head down when she does, pt denies any current health problems   Referring physician: Robert Bellow, PA-C [id number] [place],  [place] [address]  HISTORICAL INFORMATION:   Selected notes from the MEDICAL RECORD NUMBER Referred by Dr. Maryjane Hurter for concern of diabetic retinopathy   CURRENT MEDICATIONS: Current Outpatient Medications (Ophthalmic Drugs)  Medication Sig  . brimonidine (ALPHAGAN) 0.2 % ophthalmic solution Place 1 drop into the left eye 3 (three) times daily.  . dorzolamide-timolol (COSOPT) 22.3-6.8 MG/ML ophthalmic solution Place 1 drop into the left eye 2 (two) times daily.  . prednisoLONE acetate (PRED FORTE) 1 % ophthalmic suspension Place 1 drop into the left eye 4 (four) times daily. (Patient taking differently: Place 1 drop into the left eye 2 (two) times daily. )   No current facility-administered medications for this visit. (Ophthalmic Drugs)   Current Outpatient Medications (Other)  Medication Sig  . ACCU-CHEK FASTCLIX LANCETS MISC USE TO  TEST BLOOD SUGAR 5 TIMES DAILY  . albuterol (PROVENTIL HFA;VENTOLIN HFA) 108 (90 Base) MCG/ACT inhaler Inhale 2 puffs into the lungs every 6 (six) hours as needed for wheezing or shortness of breath.  . blood glucose meter kit and supplies Dispense based on patient and insurance preference. Use up to four times daily as directed. (FOR ICD-9 250.00, 250.01).  . Blood Glucose Monitoring Suppl (GLUCOCOM BLOOD GLUCOSE MONITOR) DEVI Check blood sugar as directed  . cephALEXin (KEFLEX) 500 MG capsule Take 1 capsule (500 mg total) by mouth 2 (two) times daily.  . fluticasone (FLONASE) 50 MCG/ACT nasal spray Place 1 spray into both nostrils daily as needed for allergies.   Marland Kitchen gabapentin (NEURONTIN) 600 MG tablet Take 1,200 mg by mouth 2 (two) times daily.  Marland Kitchen glucose blood (ACCU-CHEK GUIDE) test strip Use as instructed  . HYDROcodone-acetaminophen (NORCO/VICODIN) 5-325 MG tablet Take 1 tablet by mouth every 4 (four) hours as needed for moderate pain.  Marland Kitchen insulin aspart (NOVOLOG) 100 UNIT/ML injection Inject 35 Units into the skin 3 (three) times daily with meals. (Patient taking differently: Inject 25-50 Units into the skin See admin instructions. 3-4 times daily.  "Sliding scale plus 20 units.")  . Insulin Degludec (TRESIBA FLEXTOUCH) 200 UNIT/ML SOPN Inject 100 Units into the skin every morning.  . Insulin Pen Needle (FIFTY50 PEN NEEDLES) 31G X 5 MM MISC USE ONE NEEDLE  WITH EACH INSULIN INJECTION 6 TIMES PER DAY. E10.65  . labetalol (NORMODYNE) 100 MG tablet Take 1 tablet (100 mg total) by mouth 2 (two) times daily.  Marland Kitchen linaclotide (LINZESS) 145 MCG CAPS capsule Take 145 mcg by mouth daily as needed (irritable bowel syndrome).  Marland Kitchen loratadine (CLARITIN) 10 MG tablet Take 1 tablet (10 mg total) by mouth daily.  . metFORMIN (GLUCOPHAGE) 1000 MG tablet Take 1,000 mg by mouth 2 (two) times daily with a meal.  . ondansetron (ZOFRAN-ODT) 4 MG disintegrating tablet Take 1 tablet (4 mg total) by mouth every 8 (eight)  hours as needed for nausea.  . pantoprazole (PROTONIX) 40 MG tablet Take 1 tablet (40 mg total) by mouth daily at 6 (six) AM.  . tiZANidine (ZANAFLEX) 4 MG tablet Take 4 mg by mouth every 8 (eight) hours as needed for muscle spasms.    No current facility-administered medications for this visit. (Other)      REVIEW OF SYSTEMS: ROS    Positive for: Endocrine, Eyes   Negative for: Constitutional, Gastrointestinal, Neurological, Skin, Genitourinary, Musculoskeletal, HENT, Cardiovascular, Respiratory, Psychiatric, Allergic/Imm, Heme/Lymph   Last edited by Doneen Poisson on 11/06/2019  1:40 PM. (History)       ALLERGIES Allergies  Allergen Reactions  . Morphine And Related Nausea And Vomiting    PAST MEDICAL HISTORY Past Medical History:  Diagnosis Date  . Asthma   . Cellulitis of left foot   . Diabetes mellitus without complication ([place])   . Diabetic retinopathy ([place])    PDR OU  . GERD (gastroesophageal reflux disease) 10/27/2017  . H/O seasonal allergies   . Hypertension   . Hypertensive retinopathy    OU  . IBS (irritable bowel syndrome)   . Proliferative diabetic retinopathy ([place])    with vitreous hemorrhage and tractional retinal detachment, left eye  . RLS (restless legs syndrome) 10/27/2017  . Wears glasses    Past Surgical History:  Procedure Laterality Date  . AMPUTATION Right 11/01/2017   Procedure: RIGHT FOOT 5TH RAY AMPUTATION;  Surgeon: Newt Minion, MD;  Location: [facility];  Service: Orthopedics;  Laterality: Right;  . CESAREAN SECTION    . CHOLECYSTECTOMY    . CYST EXCISION    . EYE SURGERY Left 89/38/1017   PPV, silicone oil  . INJECTION OF SILICONE OIL Left 04/04/2584   Procedure: Injection Of Silicone Oil;  Surgeon: Bernarda Caffey, MD;  Location: [facility];  Service: Ophthalmology;  Laterality: Left;  . MEMBRANE PEEL Left 07/23/2019   Procedure: Antoine Primas;  Surgeon: Bernarda Caffey, MD;  Location: [facility];  Service: Ophthalmology;  Laterality: Left;  .  PARS PLANA VITRECTOMY Left 07/23/2019   Procedure: Pars Plana Vitrectomy With 25 Gauge;  Surgeon: Bernarda Caffey, MD;  Location: [facility];  Service: Ophthalmology;  Laterality: Left;  . PHOTOCOAGULATION WITH LASER Left 07/23/2019   Procedure: Photocoagulation With Laser;  Surgeon: Bernarda Caffey, MD;  Location: [facility];  Service: Ophthalmology;  Laterality: Left;  . REPAIR OF COMPLEX TRACTION RETINAL DETACHMENT Left 07/23/2019   Procedure: REPAIR OF COMPLEX TRACTION RETINAL DETACHMENT;  Surgeon: Bernarda Caffey, MD;  Location: [facility];  Service: Ophthalmology;  Laterality: Left;  . TONSILLECTOMY    . TOOTH EXTRACTION      FAMILY HISTORY Family History  Problem Relation Age of Onset  . Diabetes Mother   . Cancer Mother   . Irritable bowel syndrome Mother   . Hypertension Mother   . Sleep apnea Mother   . Diabetes Father   .  Restless legs syndrome Father     SOCIAL HISTORY Social History   Tobacco Use  . Smoking status: Never Smoker  . Smokeless tobacco: Never Used  Substance Use Topics  . Alcohol use: No  . Drug use: No         OPHTHALMIC EXAM:  Base Eye Exam    Visual Acuity (Snellen - Linear)      Right Left   Dist cc 20/25 -2 CF @ face   Dist ph cc NI NI   Correction: Glasses       Tonometry (Tonopen, 1:43 PM)      Right Left   Pressure 17 19       Pupils      Dark Light Shape React APD   Right 4 3 Round Brisk 0   Left 6 5 Round Brisk 0       Visual Fields      Left Right     Full   Restrictions Total inferior temporal, superior nasal, inferior nasal deficiencies        Extraocular Movement      Right Left    Full Full       Neuro/Psych    Oriented x3: Yes   Mood/Affect: Normal       Dilation    Both eyes: 1.0% Mydriacyl, 2.5% Phenylephrine @ 1:43 PM        Slit Lamp and Fundus Exam    Slit Lamp Exam      Right Left   Lids/Lashes Dermatochalasis - upper lid, mild Meibomian gland dysfunction Dermatochalasis - upper lid, mild Meibomian gland  dysfunction, periorbital edema   Conjunctiva/Sclera White and quiet White and quiet, sutures almost dissolved   Cornea trace Punctate epithelial erosions trace  PEE   Anterior Chamber Deep and clear, narrow temporal angle Deep and quiet   Iris Round and moderately dilated to 60m, No NVI Round and moderately dilated to 6.535m No NVI   Lens Clear Trace Cortical cataract   Vitreous mild Vitreous syneresis post vitrectomy; good silicon oil fill       Fundus Exam      Right Left   Disc Pink and Sharp, +fine NVD temporally and superiorly - regressed improved fibrosis, mild Pallor, Sharp rim   C/D Ratio 0.2    Macula Blunted foveal reflex, +central cystic changes, +ERM, scattered MA/DBH greatest temporal macula, +NVE along ST arcades, pre-retinal / sub-hyaloid heme centrally - improved residual SRF - slightly improved from prior, mild ERM along ST arcade, mild residual fibrosis improving, scattered Microaneurysms / IRH   Vessels Vascular attenuation, Tortuous, +NV, +venus beading IN arcades, fine NVE along ST arcades Attenuated; tortuous; mild residual fibrosis along IT arcades   Periphery Attached, scattered DBH/CWS nasally  Persistent SRF / shallow RD posteriorly under oil; good 360 PRP; improved fibrosis, scattered IRH mostly around arcades        Refraction    Wearing Rx      Sphere Cylinder Axis   Right -4.50 +1.75 018   Left -5.00 +1.00 124   Type: SVL          IMAGING AND PROCEDURES  Imaging and Procedures for _0 @  OCT, Retina - OU - Both Eyes       Right Eye Quality was good. Central Foveal Thickness: 338. Progression has improved. Findings include no SRF, vitreomacular adhesion , epiretinal membrane, abnormal foveal contour, no IRF (mild interval improvement in perifoveal sub-hyaloid heme, mild, non-central cystic changes - improved, +disc edema --  improved).   Left Eye Quality was good. Central Foveal Thickness: 766. Progression has improved. Findings include  epiretinal membrane, subretinal hyper-reflective material, intraretinal fluid, subretinal fluid, abnormal foveal contour (Mild interval improvement in SRF under oil; +ERM greatest along ST arcades).   Notes *Images captured and stored on drive  Diagnosis / Impression:  OD: abnormal foveal contour; mild interval improvement in perifoveal sub-hyaloid heme, mild, non-central cystic changes - improved, +disc edema -- improved OS: preretinal fibrosis and tractional membranes improved; Persistent SRF under oil--minimal improvement; +ERM greatest along ST arcades  Clinical management:  See below  Abbreviations: NFP - Normal foveal profile. CME - cystoid macular edema. PED - pigment epithelial detachment. IRF - intraretinal fluid. SRF - subretinal fluid. EZ - ellipsoid zone. ERM - epiretinal membrane. ORA - outer retinal atrophy. ORT - outer retinal tubulation. SRHM - subretinal hyper-reflective material                 ASSESSMENT/PLAN:    ICD-10-CM   1. Proliferative diabetic retinopathy of both eyes with macular edema associated with type 2 diabetes mellitus ([place])  K02.5427   2. Retinal edema  H35.81 OCT, Retina - OU - Both Eyes  3. Vitreous hemorrhage of left eye (H[facility])  H43.12   4. 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 Essential hypertension  I10   6. Hypertensive retinopathy of both eyes  H35.033     1-4. Proliferative diabetic retinopathy with macular edema OU (OS > OD)  - OS with acute decrease in vision from vitreous/subhyaloid heme  - initial exam showed large preretinal/subhyaloid heme OS and scattered fibrosis w/ NV; OD with scattered intraretinal DBH and flat NV  - FA (07.06.20) shows NV and capillary nonperfusion OU  - s/p IVA OS (07.06.20), #2 (08.03.20)  - s/p IVA OD #1 (08.03.20)  - s/p PRP OS (07.06.20)  - s/p PRP OD (08.03.20)  - OS with progressive increase in VH and tractional fibrosis pre op  - s/p PPV/MP/EL/silicone oil + IVA OS 06.23.7628              - good silicone oil bubble in place  - tractional membranes and fibrosis improved, but shallow residual SRF persists posteriorly -- minimal interval improvement from prior  - discussed findings -- resolution of SRF likely impacted by ERM and other systemic illnesses  - discussed re-operation to remove ERM and attach retina posteriorly -- pt wishes to proceed with 25g PPV w/ MP, possible SRF drainage and silicon oil exchange OS under general anesthesia in January             - IOP improved to 19 OS             - completed PF  - Brimonidine TID OS   - Cosopt TID OS             - PSO ung OS -- bedtime / PRN             - cont face down positioning 50% of time; avoid laying flat on back              - post op drop and positioning instructions reviewed              - tylenol/ibuprofen for pain  - f/u December 23 -- pre-op visit   5,6. Hypertensive retinopathy OU  - discussed importance of tight BP control  - monitor    Ophthalmic Meds Ordered this visit:  No orders  of the defined types were placed in this encounter.      Return in about 12 days (around 11/18/2019) for f/u PDR -- pre-op.  There are no Patient Instructions on file for this visit.   Explained the diagnoses, plan, and follow up with the patient and they expressed understanding.  Patient expressed understanding of the importance of proper follow up care.    This document serves as a record of services personally performed by Gardiner Sleeper, MD, PhD. It was created on their behalf by Ernest Mallick, OA, an ophthalmic assistant. The creation of this record is the provider's dictation and/or activities during the visit.    Electronically signed by: Ernest Mallick, OA 12.11.2020 7:55 PM  Gardiner Sleeper, M.D., Ph.D. Diseases & Surgery of the Retina and Vitreous [facility]  I have reviewed the above documentation for accuracy and completeness, and I agree with the above. Gardiner Sleeper, M.D., Ph.D.  11/08/19 7:55 PM    Abbreviations: M myopia (nearsighted); A astigmatism; H hyperopia (farsighted); P presbyopia; Mrx spectacle prescription;  CTL contact lenses; OD right eye; OS left eye; OU both eyes  XT exotropia; ET esotropia; PEK punctate epithelial keratitis; PEE punctate epithelial erosions; DES dry eye syndrome; MGD meibomian gland dysfunction; ATs artificial tears; PFAT's preservative free artificial tears; [place] nuclear sclerotic cataract; PSC posterior subcapsular cataract; ERM epi-retinal membrane; PVD posterior vitreous detachment; RD retinal detachment; DM diabetes mellitus; DR diabetic retinopathy; NPDR non-proliferative diabetic retinopathy; PDR proliferative diabetic retinopathy; CSME clinically significant macular edema; DME diabetic macular edema; dbh dot blot hemorrhages; CWS cotton wool spot; POAG primary open angle glaucoma; C/D cup-to-disc ratio; HVF humphrey visual field; GVF goldmann visual field; OCT optical coherence tomography; IOP intraocular pressure; BRVO Branch retinal vein occlusion; CRVO central retinal vein occlusion; CRAO central retinal artery occlusion; BRAO branch retinal artery occlusion; RT retinal tear; SB scleral buckle; PPV pars plana vitrectomy; VH Vitreous hemorrhage; PRP panretinal laser photocoagulation; IVK intravitreal kenalog; VMT vitreomacular traction; MH Macular hole;  NVD neovascularization of the disc; NVE neovascularization elsewhere; AREDS age related eye disease study; ARMD age related macular degeneration; POAG primary open angle glaucoma; EBMD epithelial/anterior basement membrane dystrophy; ACIOL anterior chamber intraocular lens; IOL intraocular lens; PCIOL posterior chamber intraocular lens; Phaco/IOL phacoemulsification with intraocular lens placement; [place] photorefractive keratectomy; LASIK laser assisted in situ keratomileusis; HTN hypertension; DM diabetes mellitus; COPD chronic obstructive pulmonary disease

## 2019-11-08 ENCOUNTER — Encounter (INDEPENDENT_AMBULATORY_CARE_PROVIDER_SITE_OTHER): Payer: Self-pay | Admitting: Ophthalmology

## 2019-11-16 NOTE — Progress Notes (Signed)
[facility] Note  11/18/2019     CHIEF COMPLAINT Patient presents for Retina Follow Up   HISTORY OF PRESENT ILLNESS: Lindsay Love is a 38 y.o. female who presents to the clinic today for:   HPI    Retina Follow Up    Patient presents with  Diabetic Retinopathy.  In both eyes.  Duration of 12 days.  Since onset it is stable.  I, the attending physician,  performed the HPI with the patient and updated documentation appropriately.          Comments    Pt states vision in her left eye is the same, pt states she is seeing new flashes in her left eye, she states they look like "messed up squares", pt is ready to proceed with surgery on the left eye again       Last edited by Bernarda Caffey, MD on 11/18/2019  2:02 PM. (History)    pt here for pre-op planning and consent  Referring physician: Robert Bellow, PA-C [address] [place],  [place] [address]  HISTORICAL INFORMATION:   Selected notes from the MEDICAL RECORD NUMBER Referred by Dr. Maryjane Hurter for concern of diabetic retinopathy   CURRENT MEDICATIONS: Current Outpatient Medications (Ophthalmic Drugs)  Medication Sig  . brimonidine (ALPHAGAN) 0.2 % ophthalmic solution Place 1 drop into the left eye 3 (three) times daily.  . dorzolamide-timolol (COSOPT) 22.3-6.8 MG/ML ophthalmic solution Place 1 drop into the left eye 2 (two) times daily.  . prednisoLONE acetate (PRED FORTE) 1 % ophthalmic suspension Place 1 drop into the left eye 4 (four) times daily. (Patient taking differently: Place 1 drop into the left eye 2 (two) times daily. )   No current facility-administered medications for this visit. (Ophthalmic Drugs)   Current Outpatient Medications (Other)  Medication Sig  . ACCU-CHEK FASTCLIX LANCETS MISC USE TO TEST BLOOD SUGAR 5 TIMES DAILY  . albuterol (PROVENTIL HFA;VENTOLIN HFA) 108 (90 Base) MCG/ACT inhaler Inhale 2 puffs into the lungs every 6 (six) hours as needed for  wheezing or shortness of breath.  . blood glucose meter kit and supplies Dispense based on patient and insurance preference. Use up to four times daily as directed. (FOR ICD-9 250.00, 250.01).  . Blood Glucose Monitoring Suppl (GLUCOCOM BLOOD GLUCOSE MONITOR) DEVI Check blood sugar as directed  . cephALEXin (KEFLEX) 500 MG capsule Take 1 capsule (500 mg total) by mouth 2 (two) times daily.  . fluticasone (FLONASE) 50 MCG/ACT nasal spray Place 1 spray into both nostrils daily as needed for allergies.   Marland Kitchen gabapentin (NEURONTIN) 600 MG tablet Take 1,200 mg by mouth 2 (two) times daily.  Marland Kitchen glucose blood (ACCU-CHEK GUIDE) test strip Use as instructed  . HYDROcodone-acetaminophen (NORCO/VICODIN) 5-325 MG tablet Take 1 tablet by mouth every 4 (four) hours as needed for moderate pain.  Marland Kitchen insulin aspart (NOVOLOG) 100 UNIT/ML injection Inject 35 Units into the skin 3 (three) times daily with meals. (Patient taking differently: Inject 25-50 Units into the skin See admin instructions. 3-4 times daily.  "Sliding scale plus 20 units.")  . Insulin Degludec (TRESIBA FLEXTOUCH) 200 UNIT/ML SOPN Inject 100 Units into the skin every morning.  . Insulin Pen Needle (FIFTY50 PEN NEEDLES) 31G X 5 MM MISC USE ONE NEEDLE WITH EACH INSULIN INJECTION 6 TIMES PER DAY. E10.65  . labetalol (NORMODYNE) 100 MG tablet Take 1 tablet (100 mg total) by mouth 2 (two) times daily.  Marland Kitchen linaclotide (LINZESS) 145  MCG CAPS capsule Take 145 mcg by mouth daily as needed (irritable bowel syndrome).  Marland Kitchen loratadine (CLARITIN) 10 MG tablet Take 1 tablet (10 mg total) by mouth daily.  . metFORMIN (GLUCOPHAGE) 1000 MG tablet Take 1,000 mg by mouth 2 (two) times daily with a meal.  . ondansetron (ZOFRAN-ODT) 4 MG disintegrating tablet Take 1 tablet (4 mg total) by mouth every 8 (eight) hours as needed for nausea.  . pantoprazole (PROTONIX) 40 MG tablet Take 1 tablet (40 mg total) by mouth daily at 6 (six) AM.  . tiZANidine (ZANAFLEX) 4 MG tablet Take  4 mg by mouth every 8 (eight) hours as needed for muscle spasms.    No current facility-administered medications for this visit. (Other)      REVIEW OF SYSTEMS: ROS    Positive for: Endocrine, Eyes   Negative for: Constitutional, Gastrointestinal, Neurological, Skin, Genitourinary, Musculoskeletal, HENT, Cardiovascular, Respiratory, Psychiatric, Allergic/Imm, Heme/Lymph   Last edited by Debbrah Alar, COT on 11/18/2019  1:58 PM. (History)       ALLERGIES Allergies  Allergen Reactions  . Morphine And Related Nausea And Vomiting    PAST MEDICAL HISTORY Past Medical History:  Diagnosis Date  . Asthma   . Cellulitis of left foot   . Diabetes mellitus without complication ([place])   . Diabetic retinopathy ([place])    PDR OU  . GERD (gastroesophageal reflux disease) 10/27/2017  . H/O seasonal allergies   . Hypertension   . Hypertensive retinopathy    OU  . IBS (irritable bowel syndrome)   . Proliferative diabetic retinopathy ([place])    with vitreous hemorrhage and tractional retinal detachment, left eye  . RLS (restless legs syndrome) 10/27/2017  . Wears glasses    Past Surgical History:  Procedure Laterality Date  . AMPUTATION Right 11/01/2017   Procedure: RIGHT FOOT 5TH RAY AMPUTATION;  Surgeon: Newt Minion, MD;  Location: [facility];  Service: Orthopedics;  Laterality: Right;  . CESAREAN SECTION    . CHOLECYSTECTOMY    . CYST EXCISION    . EYE SURGERY Left 02/72/5366   PPV, silicone oil  . INJECTION OF SILICONE OIL Left 4/40/3474   Procedure: Injection Of Silicone Oil;  Surgeon: Bernarda Caffey, MD;  Location: [facility];  Service: Ophthalmology;  Laterality: Left;  . MEMBRANE PEEL Left 07/23/2019   Procedure: Antoine Primas;  Surgeon: Bernarda Caffey, MD;  Location: [facility];  Service: Ophthalmology;  Laterality: Left;  . PARS PLANA VITRECTOMY Left 07/23/2019   Procedure: [place] Vitrectomy With 25 Gauge;  Surgeon: Bernarda Caffey, MD;  Location: [facility];  Service: Ophthalmology;   Laterality: Left;  . PHOTOCOAGULATION WITH LASER Left 07/23/2019   Procedure: Photocoagulation With Laser;  Surgeon: Bernarda Caffey, MD;  Location: [facility];  Service: Ophthalmology;  Laterality: Left;  . REPAIR OF COMPLEX TRACTION RETINAL DETACHMENT Left 07/23/2019   Procedure: REPAIR OF COMPLEX TRACTION RETINAL DETACHMENT;  Surgeon: Bernarda Caffey, MD;  Location: [facility];  Service: Ophthalmology;  Laterality: Left;  . TONSILLECTOMY    . TOOTH EXTRACTION      FAMILY HISTORY Family History  Problem Relation Age of Onset  . Diabetes Mother   . Cancer Mother   . Irritable bowel syndrome Mother   . Hypertension Mother   . Sleep apnea Mother   . Diabetes Father   . Restless legs syndrome Father     SOCIAL HISTORY Social History   Tobacco Use  . Smoking status: Never Smoker  . Smokeless tobacco: Never Used  Substance  Use Topics  . Alcohol use: No  . Drug use: No         OPHTHALMIC EXAM:  Base Eye Exam    Visual Acuity (Snellen - Linear)      Right Left   Dist cc 20/25 +2 CF at 3'   Correction: Glasses       Tonometry (Tonopen, 2:03 PM)      Right Left   Pressure 23 23       Tonometry #2 (Tonopen, 2:03 PM)      Right Left   Pressure 22 21       Pupils      Dark Light Shape React APD   Right 5 4 Round Brisk None   Left 5 4 Round Brisk None       Visual Fields (Counting fingers)      Left Right     Full   Restrictions Total inferior temporal, superior nasal, inferior nasal deficiencies        Extraocular Movement      Right Left    Full, Ortho Full, Ortho       Neuro/Psych    Oriented x3: Yes   Mood/Affect: Normal       Dilation    Left eye: 1.0% Mydriacyl, 2.5% Phenylephrine @ 2:04 PM        Slit Lamp and Fundus Exam    Slit Lamp Exam      Right Left   Lids/Lashes Dermatochalasis - upper lid, mild Meibomian gland dysfunction Dermatochalasis - upper lid, mild Meibomian gland dysfunction, periorbital edema   Conjunctiva/Sclera White and quiet White  and quiet, sutures almost dissolved   Cornea trace Punctate epithelial erosions trace  PEE   Anterior Chamber Deep and clear, narrow temporal angle Deep and quiet   Iris Round and moderately dilated to 54m, No NVI Round and moderately dilated to 6.536m No NVI   Lens Clear Trace Cortical cataract   Vitreous mild Vitreous syneresis post vitrectomy; good silicon oil fill       Fundus Exam      Right Left   Disc Pink and Sharp, +fine NVD temporally and superiorly - regressed improved fibrosis, mild Pallor, Sharp rim   C/D Ratio 0.2    Macula Blunted foveal reflex, +central cystic changes, +ERM, scattered MA/DBH greatest temporal macula, +NVE along ST arcades, pre-retinal / sub-hyaloid heme centrally - improved residual SRF - slightly improved from prior, mild ERM along ST arcade, mild residual fibrosis improved, scattered Microaneurysms / IRH   Vessels Vascular attenuation, Tortuous, +NV, +venus beading IN arcades, fine NVE along ST arcades Attenuated; tortuous; mild residual fibrosis along IT arcades   Periphery Attached, scattered DBH/CWS nasally  Persistent SRF / shallow RD posteriorly under oil; good 360 PRP; improved fibrosis, scattered IRH mostly around arcades          IMAGING AND PROCEDURES  Imaging and Procedures for '@TODAY' @  OCT, Retina - OU - Both Eyes       Right Eye Quality was good. Central Foveal Thickness: 339. Progression has been stable. Findings include no SRF, vitreomacular adhesion , epiretinal membrane, abnormal foveal contour, no IRF (mild interval improvement in perifoveal sub-hyaloid heme, mild, non-central cystic changes - improved, +disc edema -- improved).   Left Eye Quality was good. Central Foveal Thickness: 716. Progression has been stable. Findings include epiretinal membrane, subretinal hyper-reflective material, intraretinal fluid, subretinal fluid, abnormal foveal contour (Trace interval improvement in SRF under oil; +ERM greatest along ST arcades).  Notes *Images captured and stored on drive  Diagnosis / Impression:  OD: abnormal foveal contour; mild interval improvement in perifoveal sub-hyaloid heme, mild, non-central cystic changes - improved, +disc edema -- improved OS: preretinal fibrosis and tractional membranes improved; Persistent SRF under oil--minimal improvement; +ERM greatest along ST arcades  Clinical management:  See below  Abbreviations: NFP - Normal foveal profile. CME - cystoid macular edema. PED - pigment epithelial detachment. IRF - intraretinal fluid. SRF - subretinal fluid. EZ - ellipsoid zone. ERM - epiretinal membrane. ORA - outer retinal atrophy. ORT - outer retinal tubulation. SRHM - subretinal hyper-reflective material                 ASSESSMENT/PLAN:    ICD-10-CM   1. Proliferative diabetic retinopathy of both eyes with macular edema associated with type 2 diabetes mellitus ([facility])  Y70.6237   2. Retinal edema  H35.81 OCT, Retina - OU - Both Eyes  3. Vitreous hemorrhage of left eye (H[facility])  H43.12   4. 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 Essential hypertension  I10   6. Hypertensive retinopathy of both eyes  H35.033     1-4. Proliferative diabetic retinopathy with macular edema OU (OS > OD)  - initial exam showed large preretinal/subhyaloid heme OS and scattered fibrosis w/ NV; OD with scattered intraretinal DBH and flat NV  - FA (07.06.20) shows NV and capillary nonperfusion OU  - s/p IVA OS (07.06.20), #2 (08.03.20)  - s/p IVA OD #1 (08.03.20)  - s/p PRP OS (07.06.20)  - s/p PRP OD (08.03.20)  - OS with progressive increase in VH and tractional fibrosis pre op  - s/p PPV/MP/EL/silicone oil + IVA OS [phone]             - good silicone oil bubble in place             - IOP 21 OS  - tractional membranes and fibrosis improved, but shallow residual SRF persists posteriorly -- minimal interval improvement from prior  - discussed findings -- resolution of SRF likely impacted by ERM  and other systemic illnesses  - discussed re-operation to remove ERM and attach retina posteriorly -- pt wishes to proceed with 25g PPV w/ MP, possible SRF drainage and silicon oil exchange OS under general anesthesia in January  - RBA of procedure discussed, questions answered  - informed consent obtained and signed  - surgery scheduled for Jan 14, [facility] OR 08  - will need medical clearance from PCP and pre-op COVID test  - cont Brimonidine TID OS    Cosopt TID OS              PSO ung OS -- bedtime / PRN             - avoid laying flat on back   - f/u Jan 15 for POV   5,6. Hypertensive retinopathy OU  - discussed importance of tight BP control  - monitor    Ophthalmic Meds Ordered this visit:  No orders of the defined types were placed in this encounter.      Return in about 23 days (around 12/11/2019) for POV.  There are no Patient Instructions on file for this visit.   Explained the diagnoses, plan, and follow up with the patient and they expressed understanding.  Patient expressed understanding of the importance of proper follow up care.    This document serves as a record of services personally performed by Gardiner Sleeper,  MD, PhD. It was created on their behalf by Roselee Nova, COMT. The creation of this record is the provider's dictation and/or activities during the visit.  Electronically signed by: Roselee Nova, COMT 11/19/19 11:14 AM  Gardiner Sleeper, M.D., Ph.D. Diseases & Surgery of the Retina and [place] 11/18/2019   I have reviewed the above documentation for accuracy and completeness, and I agree with the above. Gardiner Sleeper, M.D., Ph.D. 11/19/19 11:14 AM    Abbreviations: M myopia (nearsighted); A astigmatism; H hyperopia (farsighted); P presbyopia; Mrx spectacle prescription;  CTL contact lenses; OD right eye; OS left eye; OU both eyes  XT exotropia; ET esotropia; PEK punctate epithelial keratitis; PEE punctate  epithelial erosions; DES dry eye syndrome; MGD meibomian gland dysfunction; ATs artificial tears; PFAT's preservative free artificial tears; [place]ley nuclear sclerotic cataract; PSC posterior subcapsular cataract; ERM epi-retinal membrane; PVD posterior vitreous detachment; RD retinal detachment; DM diabetes mellitus; DR diabetic retinopathy; NPDR non-proliferative diabetic retinopathy; PDR proliferative diabetic retinopathy; CSME clinically significant macular edema; DME diabetic macular edema; dbh dot blot hemorrhages; CWS cotton wool spot; POAG primary open angle glaucoma; C/D cup-to-disc ratio; HVF humphrey visual field; GVF goldmann visual field; OCT optical coherence tomography; IOP intraocular pressure; BRVO Branch retinal vein occlusion; CRVO central retinal vein occlusion; CRAO central retinal artery occlusion; BRAO branch retinal artery occlusion; RT retinal tear; SB scleral buckle; PPV pars plana vitrectomy; VH Vitreous hemorrhage; PRP panretinal laser photocoagulation; IVK intravitreal kenalog; VMT vitreomacular traction; MH Macular hole;  NVD neovascularization of the disc; NVE neovascularization elsewhere; AREDS age related eye disease study; ARMD age related macular degeneration; POAG primary open angle glaucoma; EBMD epithelial/anterior basement membrane dystrophy; ACIOL anterior chamber intraocular lens; IOL intraocular lens; PCIOL posterior chamber intraocular lens; Phaco/IOL phacoemulsification with intraocular lens placement; Sims photorefractive keratectomy; LASIK laser assisted in situ keratomileusis; HTN hypertension; DM diabetes mellitus; COPD chronic obstructive pulmonary disease

## 2019-11-18 ENCOUNTER — Ambulatory Visit (INDEPENDENT_AMBULATORY_CARE_PROVIDER_SITE_OTHER): Payer: Medicaid Other | Admitting: Ophthalmology

## 2019-11-18 ENCOUNTER — Other Ambulatory Visit: Payer: Self-pay

## 2019-11-18 ENCOUNTER — Encounter (INDEPENDENT_AMBULATORY_CARE_PROVIDER_SITE_OTHER): Payer: Self-pay | Admitting: Ophthalmology

## 2019-11-18 DIAGNOSIS — H4312 Vitreous hemorrhage, left eye: Secondary | ICD-10-CM

## 2019-11-18 DIAGNOSIS — H3342 Traction detachment of retina, left eye: Secondary | ICD-10-CM

## 2019-11-18 DIAGNOSIS — H3581 Retinal edema: Secondary | ICD-10-CM | POA: Diagnosis not present

## 2019-11-18 DIAGNOSIS — E113513 Type 2 diabetes mellitus with proliferative diabetic retinopathy with macular edema, bilateral: Secondary | ICD-10-CM | POA: Diagnosis not present

## 2019-11-18 DIAGNOSIS — I10 Essential (primary) hypertension: Secondary | ICD-10-CM

## 2019-11-18 DIAGNOSIS — H35033 Hypertensive retinopathy, bilateral: Secondary | ICD-10-CM

## 2019-12-02 ENCOUNTER — Encounter (HOSPITAL_BASED_OUTPATIENT_CLINIC_OR_DEPARTMENT_OTHER): Payer: Self-pay

## 2019-12-02 ENCOUNTER — Emergency Department (HOSPITAL_BASED_OUTPATIENT_CLINIC_OR_DEPARTMENT_OTHER)
Admission: EM | Admit: 2019-12-02 | Discharge: 2019-12-02 | Discharge status: Against Medical Advice | Payer: Medicaid Other | Attending: Emergency Medicine | Admitting: Emergency Medicine

## 2019-12-02 ENCOUNTER — Other Ambulatory Visit: Payer: Self-pay

## 2019-12-02 DIAGNOSIS — M79604 Pain in right leg: Secondary | ICD-10-CM | POA: Insufficient documentation

## 2019-12-02 DIAGNOSIS — Z5321 Procedure and treatment not carried out due to patient leaving prior to being seen by health care provider: Secondary | ICD-10-CM | POA: Insufficient documentation

## 2019-12-02 NOTE — ED Notes (Signed)
After triage pt states she doe snot want to wait to be seen states she will come back later-NAD-pt assisted to car via w/c

## 2019-12-02 NOTE — ED Triage Notes (Signed)
Pt c/o pain to right LE x 2 weeks-states she has neuropathy in her foot and feels she may have an infection in her foot-denies injury-NAD-to triage in w/c

## 2019-12-05 ENCOUNTER — Encounter (HOSPITAL_BASED_OUTPATIENT_CLINIC_OR_DEPARTMENT_OTHER): Payer: Self-pay | Admitting: *Deleted

## 2019-12-05 ENCOUNTER — Other Ambulatory Visit: Payer: Self-pay

## 2019-12-05 ENCOUNTER — Emergency Department (HOSPITAL_BASED_OUTPATIENT_CLINIC_OR_DEPARTMENT_OTHER)
Admission: EM | Admit: 2019-12-05 | Discharge: 2019-12-05 | Discharge status: Against Medical Advice | Payer: Medicaid Other | Attending: Emergency Medicine | Admitting: Emergency Medicine

## 2019-12-05 ENCOUNTER — Emergency Department (HOSPITAL_BASED_OUTPATIENT_CLINIC_OR_DEPARTMENT_OTHER): Payer: Medicaid Other

## 2019-12-05 DIAGNOSIS — Y999 Unspecified external cause status: Secondary | ICD-10-CM | POA: Insufficient documentation

## 2019-12-05 DIAGNOSIS — E1122 Type 2 diabetes mellitus with diabetic chronic kidney disease: Secondary | ICD-10-CM | POA: Insufficient documentation

## 2019-12-05 DIAGNOSIS — X58XXXA Exposure to other specified factors, initial encounter: Secondary | ICD-10-CM | POA: Diagnosis not present

## 2019-12-05 DIAGNOSIS — M7989 Other specified soft tissue disorders: Secondary | ICD-10-CM

## 2019-12-05 DIAGNOSIS — Y939 Activity, unspecified: Secondary | ICD-10-CM | POA: Insufficient documentation

## 2019-12-05 DIAGNOSIS — Y929 Unspecified place or not applicable: Secondary | ICD-10-CM | POA: Diagnosis not present

## 2019-12-05 DIAGNOSIS — Z794 Long term (current) use of insulin: Secondary | ICD-10-CM | POA: Insufficient documentation

## 2019-12-05 DIAGNOSIS — Z79899 Other long term (current) drug therapy: Secondary | ICD-10-CM | POA: Diagnosis not present

## 2019-12-05 DIAGNOSIS — Z89421 Acquired absence of other right toe(s): Secondary | ICD-10-CM | POA: Insufficient documentation

## 2019-12-05 DIAGNOSIS — J45909 Unspecified asthma, uncomplicated: Secondary | ICD-10-CM | POA: Diagnosis not present

## 2019-12-05 DIAGNOSIS — I129 Hypertensive chronic kidney disease with stage 1 through stage 4 chronic kidney disease, or unspecified chronic kidney disease: Secondary | ICD-10-CM | POA: Diagnosis not present

## 2019-12-05 DIAGNOSIS — S99921A Unspecified injury of right foot, initial encounter: Secondary | ICD-10-CM | POA: Diagnosis present

## 2019-12-05 DIAGNOSIS — N183 Chronic kidney disease, stage 3 unspecified: Secondary | ICD-10-CM | POA: Insufficient documentation

## 2019-12-05 DIAGNOSIS — L03115 Cellulitis of right lower limb: Secondary | ICD-10-CM | POA: Insufficient documentation

## 2019-12-05 DIAGNOSIS — S92254A Nondisplaced fracture of navicular [scaphoid] of right foot, initial encounter for closed fracture: Secondary | ICD-10-CM | POA: Diagnosis not present

## 2019-12-05 LAB — CBC WITH DIFFERENTIAL/PLATELET
Abs Immature Granulocytes: 0.07 10*3/uL (ref 0.00–0.07)
Basophils Absolute: 0.1 10*3/uL (ref 0.0–0.1)
Basophils Relative: 1 %
Eosinophils Absolute: 0.4 10*3/uL (ref 0.0–0.5)
Eosinophils Relative: 4 %
HCT: 32.7 % — ABNORMAL LOW (ref 36.0–46.0)
Hemoglobin: 9.8 g/dL — ABNORMAL LOW (ref 12.0–15.0)
Immature Granulocytes: 1 %
Lymphocytes Relative: 19 %
Lymphs Abs: 1.8 10*3/uL (ref 0.7–4.0)
MCH: 22.2 pg — ABNORMAL LOW (ref 26.0–34.0)
MCHC: 30 g/dL (ref 30.0–36.0)
MCV: 74 fL — ABNORMAL LOW (ref 80.0–100.0)
Monocytes Absolute: 0.5 10*3/uL (ref 0.1–1.0)
Monocytes Relative: 6 %
Neutro Abs: 6.7 10*3/uL (ref 1.7–7.7)
Neutrophils Relative %: 69 %
Platelets: 549 10*3/uL — ABNORMAL HIGH (ref 150–400)
RBC: 4.42 MIL/uL (ref 3.87–5.11)
RDW: 15.5 % (ref 11.5–15.5)
WBC: 9.5 10*3/uL (ref 4.0–10.5)
nRBC: 0 % (ref 0.0–0.2)

## 2019-12-05 LAB — COMPREHENSIVE METABOLIC PANEL
ALT: 12 U/L (ref 0–44)
AST: 13 U/L — ABNORMAL LOW (ref 15–41)
Albumin: 3.4 g/dL — ABNORMAL LOW (ref 3.5–5.0)
Alkaline Phosphatase: 78 U/L (ref 38–126)
Anion gap: 9 (ref 5–15)
BUN: 25 mg/dL — ABNORMAL HIGH (ref 6–20)
CO2: 20 mmol/L — ABNORMAL LOW (ref 22–32)
Calcium: 9.2 mg/dL (ref 8.9–10.3)
Chloride: 104 mmol/L (ref 98–111)
Creatinine, Ser: 1.39 mg/dL — ABNORMAL HIGH (ref 0.44–1.00)
GFR calc Af Amer: 56 mL/min — ABNORMAL LOW (ref >60)
GFR calc non Af Amer: 48 mL/min — ABNORMAL LOW (ref >60)
Glucose, Bld: 224 mg/dL — ABNORMAL HIGH (ref 70–99)
Potassium: 4.7 mmol/L (ref 3.5–5.1)
Sodium: 133 mmol/L — ABNORMAL LOW (ref 135–145)
Total Bilirubin: 0.6 mg/dL (ref 0.3–1.2)
Total Protein: 7.7 g/dL (ref 6.5–8.1)

## 2019-12-05 MED ORDER — CEFAZOLIN SODIUM-DEXTROSE 1-4 GM/50ML-% IV SOLN
1.0000 g | Freq: Once | INTRAVENOUS | Status: AC
  Administered 2019-12-05: 1 g via INTRAVENOUS
  Filled 2019-12-05: qty 50

## 2019-12-05 MED ORDER — HYDROCODONE-ACETAMINOPHEN 5-325 MG PO TABS
1.0000 | ORAL_TABLET | Freq: Once | ORAL | Status: AC
  Administered 2019-12-05: 1 via ORAL
  Filled 2019-12-05: qty 1

## 2019-12-05 MED ORDER — HYDROCODONE-ACETAMINOPHEN 5-325 MG PO TABS: 1.0000 | ORAL_TABLET | Freq: Four times a day (QID) | ORAL | 0 refills | Status: AC | PRN

## 2019-12-05 MED ORDER — DOXYCYCLINE HYCLATE 100 MG PO CAPS: 100.0000 mg | ORAL_CAPSULE | Freq: Two times a day (BID) | ORAL | 0 refills | Status: AC

## 2019-12-05 NOTE — Discharge Instructions (Signed)
You are leaving AGAINST MEDICAL ADVICE.  Your condition could worsen and ultimately result in death. Take antibiotics as prescribed.  Take entire course, even if your symptoms improve. Use Tylenol as needed for mild to moderate pain.  Use Norco as needed for severe breakthrough pain.  Have caution, this is narcotic.  Do not drive or operate heavy machinery taking medicine. It is extremely point that you follow-up with your foot doctor Monday morning for further evaluation. Return to the emergency room with any new, worsening, or concerning symptoms.

## 2019-12-05 NOTE — ED Provider Notes (Signed)
[facility] Provider Note   CSN: [id number] Arrival date & time: 12/05/19  2102     History Chief Complaint  Patient presents with  . Foot Swelling    Lindsay Love is a 39 y.o. female presenting for evaluation of right leg pain and swelling.  Patient states the past 2 weeks, she has been having sharp shooting right leg pains.  Her leg has been persistently swollen.  At first she thought this was due to fluid overload, and thus started taking her antihypertensives as prescribed instead of intermittently.  However her pain did not improve.  She reports history of similar instances of leg pain and swelling which have resulted in foot infections requiring toe amputations.  This is happened twice before.  She denies fevers, chills, nausea, vomiting, generalized weakness.  She denies known injury, but states she is unable to check her feet due to body habitus.  She has a history of diabetes, states her blood sugars have been stable in the 200s.  Her initial amputation was done with Dr. Sharol Given, subsequent amputations done with [facility].  Additional history obtained from chart review.  Patient with a history of asthma, diabetes, diabetic retinopathy, GERD, hypertension, IBS, CKD.   HPI     Past Medical History:  Diagnosis Date  . Asthma   . Cellulitis of left foot   . Diabetes mellitus without complication ([place])   . Diabetic retinopathy ([place])    PDR OU  . GERD (gastroesophageal reflux disease) 10/27/2017  . H/O seasonal allergies   . Hypertension   . Hypertensive retinopathy    OU  . IBS (irritable bowel syndrome)   . Proliferative diabetic retinopathy ([place])    with vitreous hemorrhage and tractional retinal detachment, left eye  . RLS (restless legs syndrome) 10/27/2017  . Wears glasses     Patient Active Problem List   Diagnosis Date Noted  . Achilles tendon contracture, right   . Diabetic polyneuropathy associated with type 2 diabetes mellitus  ([place])   . CKD (chronic kidney disease), stage III 08/28/2019  . HTN (hypertension) 08/28/2019  . Abdominal pain 08/28/2019  . Ovarian cyst 08/28/2019  . Gastroparesis   . Sepsis secondary to UTI ([place]) 09/06/2018  . AKI (acute kidney injury) ([place]) 09/06/2018  . Pyelonephritis   . UTI (urinary tract infection) 07/20/2018  . H/O amputation of lesser toe, right ([place]) 11/07/2017  . Right foot infection 10/27/2017  . Cellulitis in diabetic foot ([place]) 10/27/2017  . Hyperglycemia 10/27/2017  . URI (upper respiratory infection) 10/27/2017  . Hyponatremia 10/27/2017  . Lactic acidosis 10/27/2017  . GERD (gastroesophageal reflux disease) 10/27/2017  . RLS (restless legs syndrome) 10/27/2017  . Vaginal candidiasis 10/27/2017  . 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 IBS (irritable bowel syndrome) 10/22/2016  . Asthma 10/22/2016  . Right leg pain 10/22/2016  . Cellulitis and abscess of toe of right foot   . Diabetic peripheral neuropathy ([place])   . Failure of outpatient treatment     Past Surgical History:  Procedure Laterality Date  . AMPUTATION Right 11/01/2017   Procedure: RIGHT FOOT 5TH RAY AMPUTATION;  Surgeon: Newt Minion, MD;  Location: [facility];  Service: Orthopedics;  Laterality: Right;  . CESAREAN SECTION    . CHOLECYSTECTOMY    . CYST EXCISION    . EYE SURGERY Left 66/04/3015   PPV, silicone oil  . INJECTION OF SILICONE OIL Left 0/08/9322   Procedure: Injection Of Silicone Oil;  Surgeon: Bernarda Caffey, MD;  Location: [facility];  Service: Ophthalmology;  Laterality: Left;  . MEMBRANE PEEL Left 07/23/2019   Procedure: Antoine Primas;  Surgeon: Bernarda Caffey, MD;  Location: [facility];  Service: Ophthalmology;  Laterality: Left;  . PARS PLANA VITRECTOMY Left 07/23/2019   Procedure: [place] Vitrectomy With 25 Gauge;  Surgeon: Bernarda Caffey, MD;  Location: [facility];  Service: Ophthalmology;  Laterality: Left;  . PHOTOCOAGULATION WITH LASER Left 07/23/2019     Procedure: Photocoagulation With Laser;  Surgeon: Bernarda Caffey, MD;  Location: [facility];  Service: Ophthalmology;  Laterality: Left;  . REPAIR OF COMPLEX TRACTION RETINAL DETACHMENT Left 07/23/2019   Procedure: REPAIR OF COMPLEX TRACTION RETINAL DETACHMENT;  Surgeon: Bernarda Caffey, MD;  Location: [facility];  Service: Ophthalmology;  Laterality: Left;  . TONSILLECTOMY    . TOOTH EXTRACTION       OB History   No obstetric history on file.     Family History  Problem Relation Age of Onset  . Diabetes Mother   . Cancer Mother   . Irritable bowel syndrome Mother   . Hypertension Mother   . Sleep apnea Mother   . Diabetes Father   . Restless legs syndrome Father     Social History   Tobacco Use  . Smoking status: Never Smoker  . Smokeless tobacco: Never Used  Substance Use Topics  . Alcohol use: No  . Drug use: No    Home Medications Prior to Admission medications   Medication Sig Start Date End Date Taking? Authorizing Provider  ACCU-CHEK FASTCLIX LANCETS MISC USE TO TEST BLOOD SUGAR 5 TIMES DAILY 12/22/18   Law, Bea Graff, PA-C  albuterol (PROVENTIL HFA;VENTOLIN HFA) 108 (90 Base) MCG/ACT inhaler Inhale 2 puffs into the lungs every 6 (six) hours as needed for wheezing or shortness of breath.    [provider]  bismuth subsalicylate (PEPTO BISMOL) 262 MG/15ML suspension Take 30 mLs by mouth every 6 (six) hours as needed (sour stomach).    [provider]  blood glucose meter kit and supplies Dispense based on patient and insurance preference. Use up to four times daily as directed. (FOR ICD-9 250.00, 250.01). 10/25/16   Patrecia Pour, Christean Grief, MD  Blood Glucose Monitoring Suppl (GLUCOCOM BLOOD GLUCOSE MONITOR) DEVI Check blood sugar as directed 02/25/19   [provider]  brimonidine (ALPHAGAN) 0.2 % ophthalmic solution Place 1 drop into the left eye 3 (three) times daily. Patient taking differently: Place 1 drop into the left eye 2 (two) times daily.   10/06/19 10/05/20  Bernarda Caffey, MD  cephALEXin (KEFLEX) 500 MG capsule Take 1 capsule (500 mg total) by mouth 2 (two) times daily. Patient not taking: Reported on 12/03/2019 08/31/19   Oswald Hillock, MD  dorzolamide-timolol (COSOPT) 22.3-6.8 MG/ML ophthalmic solution Place 1 drop into the left eye 2 (two) times daily. 08/31/19   Bernarda Caffey, MD  doxycycline (VIBRAMYCIN) 100 MG capsule Take 1 capsule (100 mg total) by mouth 2 (two) times daily for 7 days. 12/05/19 12/12/19  Kiele Heavrin, PA-C  fluticasone (FLONASE) 50 MCG/ACT nasal spray Place 1 spray into both nostrils daily as needed for allergies.  02/11/19   [provider]  gabapentin (NEURONTIN) 600 MG tablet Take 600-1,200 mg by mouth See admin instructions. Take 2 tablets (1200 mg) by mouth in the morning, take 1 tablet (600 mg) by mouth in the afternoon, & take 2 tablets (1200 mg) by mouth at night.    [provider]  glucose  blood (ACCU-CHEK GUIDE) test strip Use as instructed 12/22/18   Law, Bea Graff, PA-C  HYDROcodone-acetaminophen (NORCO/VICODIN) 5-325 MG tablet Take 1 tablet by mouth every 6 (six) hours as needed for severe pain. 12/05/19   Tylin Stradley, PA-C  insulin aspart (NOVOLOG) 100 UNIT/ML injection Inject 35 Units into the skin 3 (three) times daily with meals. Patient taking differently: Inject 20-50 Units into the skin 4 (four) times daily. Sliding scale 11/02/17   Rai, Ripudeep K, MD  Insulin Degludec (TRESIBA FLEXTOUCH) 200 UNIT/ML SOPN Inject 100 Units into the skin daily.     [provider]  Insulin Pen Needle (FIFTY50 PEN NEEDLES) 31G X 5 MM MISC USE ONE NEEDLE WITH EACH INSULIN INJECTION 6 TIMES PER DAY. E10.65 02/25/19   [provider]  labetalol (NORMODYNE) 100 MG tablet Take 1 tablet (100 mg total) by mouth 2 (two) times daily. Patient not taking: Reported on 12/03/2019 08/31/19   Oswald Hillock, MD  lisinopril (ZESTRIL) 40 MG tablet Take 40 mg by mouth daily.    [provider]  loratadine (CLARITIN) 10 MG tablet Take 1 tablet (10 mg total) by mouth daily. 11/02/17   Rai, Vernelle Emerald, MD  metFORMIN (GLUCOPHAGE) 1000 MG tablet Take 1,000 mg by mouth 2 (two) times daily with a meal.    [provider]  NOVOLOG FLEXPEN 100 UNIT/ML FlexPen Inject 20-50 Units into the skin 4 (four) times daily. Sliding Scale Insulin 10/25/19   [provider]  ondansetron (ZOFRAN-ODT) 4 MG disintegrating tablet Take 1 tablet (4 mg total) by mouth every 8 (eight) hours as needed for nausea. Patient not taking: Reported on 12/03/2019 08/31/19   Oswald Hillock, MD  pantoprazole (PROTONIX) 40 MG tablet Take 1 tablet (40 mg total) by mouth daily at 6 (six) AM. 09/10/18   Hosie Poisson, MD  prednisoLONE acetate (PRED FORTE) 1 % ophthalmic suspension Place 1 drop into the left eye 4 (four) times daily. Patient taking differently: Place 1 drop into the left eye 2 (two) times daily.  07/31/19   Bernarda Caffey, MD  tiZANidine (ZANAFLEX) 4 MG tablet Take 4 mg by mouth every 8 (eight) hours as needed for muscle spasms.  05/08/19   [provider]    Allergies    Morphine and related  Review of Systems   Review of Systems  Physical Exam Updated Vital Signs BP (!) 160/92 (BP Location: Right Arm)   Pulse (!) 114   Temp 98.9 F (37.2 C) (Oral)   Resp 18   Ht '5\' 7"'  (1.702 m)   Wt 113.4 kg   LMP 11/04/2019   SpO2 99%   BMI 39.16 kg/m   Physical Exam Vitals and nursing note reviewed.  Constitutional:      General: She is not in acute distress.    Appearance: She is well-developed. She is obese.     Comments: Appears nontoxic  HENT:     Head: Normocephalic and atraumatic.  Eyes:     Conjunctiva/sclera: Conjunctivae normal.     Pupils: Pupils are equal, round, and reactive to light.  Cardiovascular:     Rate and Rhythm: Normal rate and regular rhythm.     Pulses: Normal pulses.  Pulmonary:     Effort: Pulmonary effort is normal. No respiratory distress.      Breath sounds: Normal breath sounds. No wheezing.  Abdominal:     General: There is no distension.     Palpations: Abdomen is soft. There is no mass.  Tenderness: There is no abdominal tenderness. There is no guarding or rebound.  Musculoskeletal:     Cervical back: Normal range of motion and neck supple.     Right lower leg: Edema present.     Comments: 2+ pitting edema of the right lower extremity.  Pedal pulse intact.  Mild erythema.  Significant tenderness palpation the entire lower leg. No edema of the left leg.  Skin:    General: Skin is warm and dry.     Capillary Refill: Capillary refill takes less than 2 seconds.  Neurological:     Mental Status: She is alert and oriented to person, place, and time.     ED Results / Procedures / Treatments   Labs (all labs ordered are listed, but only abnormal results are displayed) Labs Reviewed  CBC WITH DIFFERENTIAL/PLATELET - Abnormal; Notable for the following components:      Result Value   Hemoglobin 9.8 (*)    HCT 32.7 (*)    MCV 74.0 (*)    MCH 22.2 (*)    Platelets 549 (*)    All other components within normal limits  COMPREHENSIVE METABOLIC PANEL - Abnormal; Notable for the following components:   Sodium 133 (*)    CO2 20 (*)    Glucose, Bld 224 (*)    BUN 25 (*)    Creatinine, Ser 1.39 (*)    Albumin 3.4 (*)    AST 13 (*)    GFR calc non Af Amer 48 (*)    GFR calc Af Amer 56 (*)    All other components within normal limits    EKG None  Radiology DG Foot Complete Right  Result Date: 12/05/2019 CLINICAL DATA:  39 year old female with right foot pain and swelling x2 weeks. EXAM: RIGHT FOOT COMPLETE - 3+ VIEW COMPARISON:  Right foot radiograph dated 08/27/2019. FINDINGS: There is fragmentation of the navicular with collapse, new since the prior radiograph and likely related to avascular necrosis or inflammatory/infectious process. Correlation with history of trauma recommended. Status post prior amputation of  the midportion of the fifth metatarsal. 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 There is diffuse soft tissue edema primarily involving the forefoot. No radiopaque foreign object or soft tissue gas. IMPRESSION: 1. Fragmentation of the navicular with collapse, new since the prior radiograph and likely related to avascular necrosis or inflammatory/infectious process. Correlation with history of trauma recommended. MRI may provide better evaluation if clinically indicated. 2. Diffuse soft tissue swelling may represent cellulitis. Clinical correlation is recommended. 3. 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 Electronically Signed   By: Anner Crete M.D.   On: 12/05/2019 23:01    Procedures Procedures (including critical care time)  Medications Ordered in ED Medications  HYDROcodone-acetaminophen (NORCO/VICODIN) 5-325 MG per tablet 1 tablet (has no administration in time range)  ceFAZolin (ANCEF) IVPB 1 g/50 mL premix (0 g Intravenous Stopped 12/05/19 2249)    ED Course  I have reviewed the triage vital signs and the nursing notes.  Pertinent labs & imaging results that were available during my care of the patient were reviewed by me and considered in my medical decision making (see chart for details).    MDM Rules/Calculators/A&P  Patient presenting for evaluation R lower leg swelling and pain. Physical exam shows pt who appears nontoxic. Exam concerning for dvt vs cellulitis. Pt states she has had an ultrasound when she has presented similarly in the past, each has been negative, but resulted in toe amputations due to infection.  There is no ultrasound available at this time, as such we will have patient follow-up for outpatient ultrasound if not improving with antibiotics.   Will obtain x-ray to rule out osteo and basic labs to ensure no concerning electrolyte abnormalities or hypoglycemia.  Exam is not consistent with sepsis.  Labs overall reassuring.  No leukocytosis.  Lites at baseline.  Stable.  X-ray viewed interpreted by me, shows previous amputations.  Per radiology read, shows navicular fragmentation, concern for AVN versus inflammation versus infection.  This is likely due to osteomyelitis in the clinical setting.  Discussed findings with patient.  Discussed recommendation of admission, however patient states she does not want to be admitted.  She states she will follow up with her foot doctor on Monday.  I encouraged prompt return to the emergency room with any worsening symptoms.  Final Clinical Impression(s) / ED Diagnoses Final diagnoses:  Right leg swelling  Closed nondisplaced fracture of navicular bone of right foot, initial encounter  Cellulitis of right lower extremity    Rx / DC Orders ED Discharge Orders         Ordered    HYDROcodone-acetaminophen (NORCO/VICODIN) 5-325 MG tablet  Every 6 hours PRN     12/05/19 2316    doxycycline (VIBRAMYCIN) 100 MG capsule  2 times daily     12/05/19 2317           Mina Babula, PA-C 12/05/19 2321    Isla Pence, MD 12/05/19 2340

## 2019-12-05 NOTE — ED Notes (Signed)
Patient verbalizes understanding of discharge instructions. Opportunity for questioning and answers were provided. Armband removed by staff, pt discharged from ED.  

## 2019-12-05 NOTE — ED Triage Notes (Signed)
Pt c/o right foot pain for over 2 weeks. She is concerned for infection. She came to the ED on the 6th but left due to the wait

## 2019-12-07 ENCOUNTER — Inpatient Hospital Stay (HOSPITAL_COMMUNITY): Admission: RE | Admit: 2019-12-07 | Payer: Medicaid Other | Source: Ambulatory Visit

## 2019-12-10 ENCOUNTER — Ambulatory Visit (HOSPITAL_COMMUNITY): Admission: RE | Admit: 2019-12-10 | Payer: Medicaid Other | Source: Home / Self Care | Admitting: Ophthalmology

## 2019-12-10 ENCOUNTER — Encounter (HOSPITAL_COMMUNITY): Admission: RE | Payer: Self-pay | Source: Home / Self Care

## 2019-12-10 SURGERY — PARS PLANA VITRECTOMY WITH 25 GAUGE
Anesthesia: General | Laterality: Left

## 2019-12-11 ENCOUNTER — Encounter (INDEPENDENT_AMBULATORY_CARE_PROVIDER_SITE_OTHER): Payer: Medicaid Other | Admitting: Ophthalmology

## 2020-04-04 ENCOUNTER — Encounter (INDEPENDENT_AMBULATORY_CARE_PROVIDER_SITE_OTHER): Payer: Medicare Other | Admitting: Ophthalmology

## 2020-04-04 DIAGNOSIS — H3342 Traction detachment of retina, left eye: Secondary | ICD-10-CM

## 2020-04-04 DIAGNOSIS — H35033 Hypertensive retinopathy, bilateral: Secondary | ICD-10-CM

## 2020-04-04 DIAGNOSIS — E113513 Type 2 diabetes mellitus with proliferative diabetic retinopathy with macular edema, bilateral: Secondary | ICD-10-CM

## 2020-04-04 DIAGNOSIS — H4312 Vitreous hemorrhage, left eye: Secondary | ICD-10-CM

## 2020-04-04 DIAGNOSIS — H3581 Retinal edema: Secondary | ICD-10-CM

## 2020-04-04 DIAGNOSIS — I10 Essential (primary) hypertension: Secondary | ICD-10-CM

## 2020-04-26 DEATH — deceased
